# Patient Record
Sex: Female | Born: 1945
Health system: Southern US, Community
[De-identification: ages and names within clinical notes are randomized; demographics above are authoritative.]

## PROBLEM LIST (undated history)

## (undated) DIAGNOSIS — F329 Major depressive disorder, single episode, unspecified: Secondary | ICD-10-CM

## (undated) DIAGNOSIS — R259 Unspecified abnormal involuntary movements: Secondary | ICD-10-CM

## (undated) DIAGNOSIS — Z8669 Personal history of other diseases of the nervous system and sense organs: Secondary | ICD-10-CM

## (undated) DIAGNOSIS — C8590 Non-Hodgkin lymphoma, unspecified, unspecified site: Secondary | ICD-10-CM

## (undated) DIAGNOSIS — Z8719 Personal history of other diseases of the digestive system: Secondary | ICD-10-CM

## (undated) DIAGNOSIS — M329 Systemic lupus erythematosus, unspecified: Secondary | ICD-10-CM

## (undated) DIAGNOSIS — R61 Generalized hyperhidrosis: Secondary | ICD-10-CM

## (undated) DIAGNOSIS — R002 Palpitations: Secondary | ICD-10-CM

## (undated) DIAGNOSIS — K449 Diaphragmatic hernia without obstruction or gangrene: Secondary | ICD-10-CM

## (undated) DIAGNOSIS — L509 Urticaria, unspecified: Secondary | ICD-10-CM

## (undated) DIAGNOSIS — F3289 Other specified depressive episodes: Secondary | ICD-10-CM

## (undated) DIAGNOSIS — Z86711 Personal history of pulmonary embolism: Secondary | ICD-10-CM

## (undated) DIAGNOSIS — F341 Dysthymic disorder: Secondary | ICD-10-CM

## (undated) DIAGNOSIS — M0560 Rheumatoid arthritis of unspecified site with involvement of other organs and systems: Secondary | ICD-10-CM

## (undated) DIAGNOSIS — I319 Disease of pericardium, unspecified: Secondary | ICD-10-CM

## (undated) DIAGNOSIS — H8102 Meniere's disease, left ear: Secondary | ICD-10-CM

## (undated) DIAGNOSIS — E039 Hypothyroidism, unspecified: Secondary | ICD-10-CM

## (undated) DIAGNOSIS — K219 Gastro-esophageal reflux disease without esophagitis: Secondary | ICD-10-CM

## (undated) DIAGNOSIS — D638 Anemia in other chronic diseases classified elsewhere: Secondary | ICD-10-CM

## (undated) HISTORY — DX: Generalized hyperhidrosis: R61

## (undated) HISTORY — DX: Hypothyroidism, unspecified: E03.9

## (undated) HISTORY — DX: Dysthymic disorder: F34.1

## (undated) HISTORY — DX: Personal history of other diseases of the nervous system and sense organs: Z86.69

## (undated) HISTORY — PX: ESOPHAGEAL DILATION: SHX303

## (undated) HISTORY — DX: Rheumatoid arthritis of unspecified site with involvement of other organs and systems: M05.60

## (undated) HISTORY — DX: Gastro-esophageal reflux disease without esophagitis: K21.9

## (undated) HISTORY — DX: Major depressive disorder, single episode, unspecified: F32.9

## (undated) HISTORY — DX: Disease of pericardium, unspecified: I31.9

## (undated) HISTORY — DX: Other specified depressive episodes: F32.89

## (undated) HISTORY — DX: Personal history of pulmonary embolism: Z86.711

## (undated) HISTORY — DX: Diaphragmatic hernia without obstruction or gangrene: K44.9

## (undated) HISTORY — DX: Palpitations: R00.2

## (undated) HISTORY — PX: OTHER SURGICAL HISTORY: SHX169

## (undated) HISTORY — DX: Unspecified abnormal involuntary movements: R25.9

## (undated) HISTORY — DX: Anemia in other chronic diseases classified elsewhere: D63.8

## (undated) HISTORY — DX: Systemic lupus erythematosus, unspecified: M32.9

## (undated) HISTORY — DX: Urticaria, unspecified: L50.9

---

## 1972-12-29 HISTORY — PX: CHOLECYSTECTOMY: SHX55

## 1972-12-29 HISTORY — PX: APPENDECTOMY: SHX54

## 1975-12-30 HISTORY — PX: LUMBAR DISC SURGERY: SHX700

## 1976-12-29 HISTORY — PX: ABDOMINAL HYSTERECTOMY: SHX81

## 1997-12-29 HISTORY — PX: OTHER SURGICAL HISTORY: SHX169

## 1998-12-03 ENCOUNTER — Inpatient Hospital Stay (HOSPITAL_COMMUNITY): Admission: AD | Admit: 1998-12-03 | Discharge: 1998-12-07 | Payer: Self-pay | Admitting: Rheumatology

## 1998-12-03 ENCOUNTER — Encounter: Payer: Self-pay | Admitting: Rheumatology

## 1999-10-29 ENCOUNTER — Encounter: Payer: Self-pay | Admitting: Rheumatology

## 1999-10-29 ENCOUNTER — Encounter: Admission: RE | Admit: 1999-10-29 | Discharge: 1999-10-29 | Payer: Self-pay | Admitting: Rheumatology

## 2000-02-27 ENCOUNTER — Encounter: Payer: Self-pay | Admitting: Family Medicine

## 2000-04-13 ENCOUNTER — Encounter: Admission: RE | Admit: 2000-04-13 | Discharge: 2000-04-13 | Payer: Self-pay | Admitting: Rheumatology

## 2000-04-13 ENCOUNTER — Encounter: Payer: Self-pay | Admitting: Rheumatology

## 2000-11-27 ENCOUNTER — Ambulatory Visit (HOSPITAL_COMMUNITY): Admission: RE | Admit: 2000-11-27 | Discharge: 2000-11-27 | Payer: Self-pay | Admitting: Gastroenterology

## 2000-11-27 ENCOUNTER — Encounter: Payer: Self-pay | Admitting: Gastroenterology

## 2000-12-07 ENCOUNTER — Ambulatory Visit (HOSPITAL_COMMUNITY): Admission: RE | Admit: 2000-12-07 | Discharge: 2000-12-07 | Payer: Self-pay | Admitting: Gastroenterology

## 2000-12-10 ENCOUNTER — Ambulatory Visit (HOSPITAL_COMMUNITY): Admission: RE | Admit: 2000-12-10 | Discharge: 2000-12-10 | Payer: Self-pay | Admitting: Gastroenterology

## 2000-12-10 ENCOUNTER — Encounter: Payer: Self-pay | Admitting: Gastroenterology

## 2002-01-29 ENCOUNTER — Encounter: Payer: Self-pay | Admitting: Family Medicine

## 2002-02-01 ENCOUNTER — Other Ambulatory Visit: Admission: RE | Admit: 2002-02-01 | Discharge: 2002-02-01 | Payer: Self-pay | Admitting: Family Medicine

## 2002-02-07 ENCOUNTER — Encounter: Payer: Self-pay | Admitting: Family Medicine

## 2002-02-07 ENCOUNTER — Encounter: Admission: RE | Admit: 2002-02-07 | Discharge: 2002-02-07 | Payer: Self-pay | Admitting: Family Medicine

## 2002-02-23 ENCOUNTER — Ambulatory Visit (HOSPITAL_COMMUNITY): Admission: RE | Admit: 2002-02-23 | Discharge: 2002-02-23 | Payer: Self-pay | Admitting: Oncology

## 2002-02-23 ENCOUNTER — Encounter: Payer: Self-pay | Admitting: Oncology

## 2005-06-24 ENCOUNTER — Ambulatory Visit: Payer: Self-pay | Admitting: Family Medicine

## 2005-07-02 ENCOUNTER — Ambulatory Visit: Payer: Self-pay | Admitting: Family Medicine

## 2005-08-01 ENCOUNTER — Ambulatory Visit: Payer: Self-pay | Admitting: Family Medicine

## 2005-08-05 ENCOUNTER — Ambulatory Visit: Payer: Self-pay | Admitting: Family Medicine

## 2005-08-22 ENCOUNTER — Encounter: Admission: RE | Admit: 2005-08-22 | Discharge: 2005-08-22 | Payer: Self-pay | Admitting: Family Medicine

## 2005-09-04 ENCOUNTER — Encounter: Admission: RE | Admit: 2005-09-04 | Discharge: 2005-09-04 | Payer: Self-pay | Admitting: Family Medicine

## 2005-10-27 ENCOUNTER — Ambulatory Visit: Payer: Self-pay | Admitting: Family Medicine

## 2005-11-17 ENCOUNTER — Ambulatory Visit: Payer: Self-pay | Admitting: Cardiology

## 2005-12-10 ENCOUNTER — Ambulatory Visit: Payer: Self-pay | Admitting: Cardiology

## 2005-12-10 ENCOUNTER — Ambulatory Visit: Payer: Self-pay

## 2005-12-15 ENCOUNTER — Ambulatory Visit: Payer: Self-pay | Admitting: Cardiology

## 2005-12-23 ENCOUNTER — Ambulatory Visit: Payer: Self-pay | Admitting: Family Medicine

## 2006-01-01 ENCOUNTER — Ambulatory Visit: Payer: Self-pay | Admitting: Family Medicine

## 2006-01-05 ENCOUNTER — Ambulatory Visit: Payer: Self-pay | Admitting: Family Medicine

## 2006-03-03 ENCOUNTER — Ambulatory Visit: Payer: Self-pay | Admitting: Family Medicine

## 2006-06-29 ENCOUNTER — Ambulatory Visit (HOSPITAL_COMMUNITY): Admission: RE | Admit: 2006-06-29 | Discharge: 2006-06-29 | Payer: Self-pay | Admitting: Internal Medicine

## 2006-08-18 ENCOUNTER — Ambulatory Visit: Payer: Self-pay | Admitting: Family Medicine

## 2006-08-25 ENCOUNTER — Ambulatory Visit: Payer: Self-pay

## 2006-08-25 ENCOUNTER — Encounter: Payer: Self-pay | Admitting: Cardiology

## 2006-09-17 ENCOUNTER — Ambulatory Visit: Payer: Self-pay | Admitting: Family Medicine

## 2007-01-12 ENCOUNTER — Encounter: Admission: RE | Admit: 2007-01-12 | Discharge: 2007-01-12 | Payer: Self-pay | Admitting: Family Medicine

## 2007-03-12 ENCOUNTER — Ambulatory Visit: Payer: Self-pay | Admitting: Family Medicine

## 2007-03-12 LAB — CONVERTED CEMR LAB
ALT: 18 units/L (ref 0–40)
AST: 25 units/L (ref 0–37)
Albumin: 3.4 g/dL — ABNORMAL LOW (ref 3.5–5.2)
Chloride: 107 meq/L (ref 96–112)
Cholesterol: 146 mg/dL (ref 0–200)
GFR calc non Af Amer: 67 mL/min
Glucose, Bld: 85 mg/dL (ref 70–99)
HCT: 37.8 % (ref 36.0–46.0)
HDL: 34.3 mg/dL — ABNORMAL LOW (ref >39.0)
Monocytes Absolute: 0.1 10*3/uL — ABNORMAL LOW (ref 0.2–0.7)
Neutro Abs: 1.3 10*3/uL — ABNORMAL LOW (ref 1.4–7.7)
Platelets: 144 10*3/uL — ABNORMAL LOW (ref 150–400)
RBC: 4.34 M/uL (ref 3.87–5.11)
TSH: 1.02 microintl units/mL (ref 0.35–5.50)
Total Protein: 7.1 g/dL (ref 6.0–8.3)
Triglycerides: 151 mg/dL — ABNORMAL HIGH (ref 0–149)
WBC: 2.3 10*3/uL — ABNORMAL LOW (ref 4.5–10.5)

## 2007-03-16 ENCOUNTER — Ambulatory Visit: Payer: Self-pay | Admitting: Family Medicine

## 2007-04-14 ENCOUNTER — Ambulatory Visit: Payer: Self-pay | Admitting: Cardiology

## 2007-04-23 ENCOUNTER — Ambulatory Visit: Payer: Self-pay | Admitting: Family Medicine

## 2007-05-20 ENCOUNTER — Ambulatory Visit: Payer: Self-pay | Admitting: Cardiology

## 2007-05-20 DIAGNOSIS — I10 Essential (primary) hypertension: Secondary | ICD-10-CM | POA: Insufficient documentation

## 2007-05-27 ENCOUNTER — Encounter: Payer: Self-pay | Admitting: Family Medicine

## 2007-05-27 DIAGNOSIS — D649 Anemia, unspecified: Secondary | ICD-10-CM

## 2007-05-27 DIAGNOSIS — E039 Hypothyroidism, unspecified: Secondary | ICD-10-CM | POA: Insufficient documentation

## 2007-05-27 DIAGNOSIS — K449 Diaphragmatic hernia without obstruction or gangrene: Secondary | ICD-10-CM | POA: Insufficient documentation

## 2007-05-27 DIAGNOSIS — K219 Gastro-esophageal reflux disease without esophagitis: Secondary | ICD-10-CM

## 2007-05-27 DIAGNOSIS — M0560 Rheumatoid arthritis of unspecified site with involvement of other organs and systems: Secondary | ICD-10-CM | POA: Insufficient documentation

## 2007-05-27 DIAGNOSIS — Z8669 Personal history of other diseases of the nervous system and sense organs: Secondary | ICD-10-CM | POA: Insufficient documentation

## 2007-05-27 DIAGNOSIS — I319 Disease of pericardium, unspecified: Secondary | ICD-10-CM | POA: Insufficient documentation

## 2007-05-27 DIAGNOSIS — M069 Rheumatoid arthritis, unspecified: Secondary | ICD-10-CM | POA: Insufficient documentation

## 2007-05-27 DIAGNOSIS — I739 Peripheral vascular disease, unspecified: Secondary | ICD-10-CM

## 2007-05-27 DIAGNOSIS — L930 Discoid lupus erythematosus: Secondary | ICD-10-CM | POA: Insufficient documentation

## 2007-05-28 ENCOUNTER — Ambulatory Visit: Payer: Self-pay | Admitting: Family Medicine

## 2007-05-28 DIAGNOSIS — R251 Tremor, unspecified: Secondary | ICD-10-CM

## 2007-07-13 ENCOUNTER — Ambulatory Visit: Payer: Self-pay | Admitting: Family Medicine

## 2007-09-21 ENCOUNTER — Ambulatory Visit: Payer: Self-pay | Admitting: Family Medicine

## 2007-09-21 DIAGNOSIS — L989 Disorder of the skin and subcutaneous tissue, unspecified: Secondary | ICD-10-CM | POA: Insufficient documentation

## 2007-09-21 LAB — CONVERTED CEMR LAB
Ketones, urine, test strip: NEGATIVE
Protein, U semiquant: NEGATIVE
Urobilinogen, UA: NEGATIVE
pH: 6

## 2007-09-22 ENCOUNTER — Encounter (INDEPENDENT_AMBULATORY_CARE_PROVIDER_SITE_OTHER): Payer: Self-pay | Admitting: Internal Medicine

## 2007-10-11 ENCOUNTER — Encounter: Payer: Self-pay | Admitting: Family Medicine

## 2007-12-02 ENCOUNTER — Encounter: Payer: Self-pay | Admitting: Family Medicine

## 2007-12-03 ENCOUNTER — Encounter: Payer: Self-pay | Admitting: Family Medicine

## 2007-12-08 ENCOUNTER — Ambulatory Visit: Payer: Self-pay | Admitting: Family Medicine

## 2008-02-23 ENCOUNTER — Telehealth: Payer: Self-pay | Admitting: Family Medicine

## 2008-10-30 ENCOUNTER — Encounter: Payer: Self-pay | Admitting: Family Medicine

## 2009-01-08 ENCOUNTER — Ambulatory Visit: Payer: Self-pay | Admitting: Family Medicine

## 2009-01-08 DIAGNOSIS — R5383 Other fatigue: Secondary | ICD-10-CM

## 2009-01-08 DIAGNOSIS — R61 Generalized hyperhidrosis: Secondary | ICD-10-CM | POA: Insufficient documentation

## 2009-01-08 DIAGNOSIS — R5381 Other malaise: Secondary | ICD-10-CM | POA: Insufficient documentation

## 2009-01-08 LAB — CONVERTED CEMR LAB
Bilirubin Urine: NEGATIVE
Ketones, urine, test strip: NEGATIVE
Specific Gravity, Urine: 1.025

## 2009-01-09 LAB — CONVERTED CEMR LAB
ALT: 29 units/L (ref 0–35)
AST: 27 units/L (ref 0–37)
Albumin: 3.7 g/dL (ref 3.5–5.2)
Alkaline Phosphatase: 75 units/L (ref 39–117)
Basophils Absolute: 0 10*3/uL (ref 0.0–0.1)
Bilirubin, Direct: 0.1 mg/dL (ref 0.0–0.3)
Cholesterol: 161 mg/dL (ref 0–200)
Creatinine, Ser: 1.1 mg/dL (ref 0.4–1.2)
Eosinophils Relative: 1.6 % (ref 0.0–5.0)
Free T4: 1 ng/dL (ref 0.6–1.6)
GFR calc Af Amer: 65 mL/min
Glucose, Bld: 92 mg/dL (ref 70–99)
HDL: 34.4 mg/dL — ABNORMAL LOW (ref >39.0)
LDL Cholesterol: 91 mg/dL (ref 0–99)
Monocytes Relative: 6.9 % (ref 3.0–12.0)
RBC: 4.53 M/uL (ref 3.87–5.11)
RDW: 12.5 % (ref 11.5–14.6)
Sed Rate: 19 mm/hr (ref 0–22)
Sodium: 141 meq/L (ref 135–145)
Total CHOL/HDL Ratio: 4.7
Total Protein: 7.4 g/dL (ref 6.0–8.3)
Triglycerides: 179 mg/dL — ABNORMAL HIGH (ref 0–149)
Vit D, 1,25-Dihydroxy: 41 (ref 30–89)
WBC: 3.5 10*3/uL — ABNORMAL LOW (ref 4.5–10.5)

## 2009-01-17 ENCOUNTER — Ambulatory Visit: Payer: Self-pay | Admitting: Family Medicine

## 2009-01-22 ENCOUNTER — Encounter: Payer: Self-pay | Admitting: Family Medicine

## 2009-03-28 ENCOUNTER — Ambulatory Visit: Payer: Self-pay | Admitting: Family Medicine

## 2009-03-28 DIAGNOSIS — R0602 Shortness of breath: Secondary | ICD-10-CM

## 2009-03-29 ENCOUNTER — Telehealth: Payer: Self-pay | Admitting: Family Medicine

## 2009-09-25 ENCOUNTER — Ambulatory Visit: Payer: Self-pay | Admitting: Internal Medicine

## 2009-09-25 DIAGNOSIS — M329 Systemic lupus erythematosus, unspecified: Secondary | ICD-10-CM | POA: Insufficient documentation

## 2009-12-05 ENCOUNTER — Encounter: Payer: Self-pay | Admitting: Family Medicine

## 2010-01-24 ENCOUNTER — Encounter: Payer: Self-pay | Admitting: Family Medicine

## 2010-02-27 ENCOUNTER — Telehealth: Payer: Self-pay | Admitting: Family Medicine

## 2010-03-06 ENCOUNTER — Ambulatory Visit: Payer: Self-pay | Admitting: Family Medicine

## 2010-03-07 LAB — CONVERTED CEMR LAB
Albumin: 3.5 g/dL (ref 3.5–5.2)
Basophils Absolute: 0 10*3/uL (ref 0.0–0.1)
Basophils Relative: 0.1 % (ref 0.0–3.0)
CO2: 31 meq/L (ref 19–32)
Creatinine, Ser: 1.3 mg/dL — ABNORMAL HIGH (ref 0.4–1.2)
Eosinophils Absolute: 0.1 10*3/uL (ref 0.0–0.7)
Eosinophils Relative: 2.2 % (ref 0.0–5.0)
Free T4: 1.1 ng/dL (ref 0.6–1.6)
GFR calc non Af Amer: 43.68 mL/min (ref >60)
HCT: 38.1 % (ref 36.0–46.0)
MCV: 90.1 fL (ref 78.0–100.0)
Monocytes Absolute: 1.5 10*3/uL — ABNORMAL HIGH (ref 0.1–1.0)
Neutro Abs: 0.3 10*3/uL — ABNORMAL LOW (ref 1.4–7.7)
Sed Rate: 16 mm/hr (ref 0–22)

## 2010-03-12 ENCOUNTER — Ambulatory Visit: Payer: Self-pay | Admitting: Family Medicine

## 2010-03-12 ENCOUNTER — Encounter (INDEPENDENT_AMBULATORY_CARE_PROVIDER_SITE_OTHER): Payer: Self-pay | Admitting: *Deleted

## 2010-03-12 DIAGNOSIS — R1310 Dysphagia, unspecified: Secondary | ICD-10-CM

## 2010-03-15 ENCOUNTER — Encounter: Admission: RE | Admit: 2010-03-15 | Discharge: 2010-03-15 | Payer: Self-pay | Admitting: Family Medicine

## 2010-03-25 ENCOUNTER — Telehealth: Payer: Self-pay | Admitting: Family Medicine

## 2010-03-25 ENCOUNTER — Encounter: Admission: RE | Admit: 2010-03-25 | Discharge: 2010-03-25 | Payer: Self-pay | Admitting: Family Medicine

## 2010-03-25 ENCOUNTER — Encounter: Payer: Self-pay | Admitting: Family Medicine

## 2010-03-25 LAB — HM MAMMOGRAPHY

## 2010-03-29 ENCOUNTER — Encounter: Payer: Self-pay | Admitting: Family Medicine

## 2010-03-29 DIAGNOSIS — C8590 Non-Hodgkin lymphoma, unspecified, unspecified site: Secondary | ICD-10-CM

## 2010-03-29 HISTORY — PX: BREAST BIOPSY: SHX20

## 2010-03-29 HISTORY — PX: BREAST LUMPECTOMY: SHX2

## 2010-03-29 HISTORY — DX: Non-Hodgkin lymphoma, unspecified, unspecified site: C85.90

## 2010-04-01 ENCOUNTER — Telehealth: Payer: Self-pay | Admitting: Family Medicine

## 2010-04-03 ENCOUNTER — Encounter: Admission: RE | Admit: 2010-04-03 | Discharge: 2010-04-03 | Payer: Self-pay | Admitting: General Surgery

## 2010-04-05 ENCOUNTER — Ambulatory Visit
Admission: RE | Admit: 2010-04-05 | Discharge: 2010-04-05 | Payer: Self-pay | Source: Home / Self Care | Admitting: General Surgery

## 2010-04-05 ENCOUNTER — Encounter: Payer: Self-pay | Admitting: Family Medicine

## 2010-04-05 ENCOUNTER — Encounter: Admission: RE | Admit: 2010-04-05 | Discharge: 2010-04-05 | Payer: Self-pay | Admitting: General Surgery

## 2010-04-05 DIAGNOSIS — C8589 Other specified types of non-Hodgkin lymphoma, extranodal and solid organ sites: Secondary | ICD-10-CM | POA: Insufficient documentation

## 2010-04-09 ENCOUNTER — Telehealth: Payer: Self-pay | Admitting: Family Medicine

## 2010-04-18 ENCOUNTER — Ambulatory Visit: Payer: Self-pay | Admitting: Oncology

## 2010-04-18 ENCOUNTER — Encounter: Payer: Self-pay | Admitting: Family Medicine

## 2010-04-22 ENCOUNTER — Encounter: Payer: Self-pay | Admitting: Family Medicine

## 2010-08-06 ENCOUNTER — Encounter (INDEPENDENT_AMBULATORY_CARE_PROVIDER_SITE_OTHER): Payer: Self-pay | Admitting: *Deleted

## 2010-08-20 ENCOUNTER — Ambulatory Visit: Payer: Self-pay | Admitting: Oncology

## 2010-08-22 ENCOUNTER — Encounter: Payer: Self-pay | Admitting: Family Medicine

## 2010-08-22 LAB — CBC WITH DIFFERENTIAL/PLATELET
Basophils Absolute: 0 10*3/uL (ref 0.0–0.1)
MCH: 29.8 pg (ref 25.1–34.0)
MCV: 86.5 fL (ref 79.5–101.0)
Platelets: 137 10*3/uL — ABNORMAL LOW (ref 145–400)
RBC: 4.35 10*6/uL (ref 3.70–5.45)
WBC: 3.7 10*3/uL — ABNORMAL LOW (ref 3.9–10.3)
lymph#: 0.8 10*3/uL — ABNORMAL LOW (ref 0.9–3.3)

## 2010-08-29 ENCOUNTER — Telehealth: Payer: Self-pay | Admitting: Family Medicine

## 2010-09-13 ENCOUNTER — Ambulatory Visit: Payer: Self-pay | Admitting: Family Medicine

## 2010-09-13 DIAGNOSIS — R21 Rash and other nonspecific skin eruption: Secondary | ICD-10-CM

## 2010-10-17 ENCOUNTER — Encounter (INDEPENDENT_AMBULATORY_CARE_PROVIDER_SITE_OTHER): Payer: Self-pay | Admitting: *Deleted

## 2010-11-04 ENCOUNTER — Telehealth: Payer: Self-pay | Admitting: Family Medicine

## 2010-11-06 ENCOUNTER — Encounter: Payer: Self-pay | Admitting: Family Medicine

## 2010-12-27 ENCOUNTER — Encounter: Payer: Self-pay | Admitting: Family Medicine

## 2010-12-27 ENCOUNTER — Ambulatory Visit
Admission: RE | Admit: 2010-12-27 | Discharge: 2010-12-27 | Payer: Self-pay | Source: Home / Self Care | Attending: Family Medicine | Admitting: Family Medicine

## 2010-12-27 DIAGNOSIS — R6883 Chills (without fever): Secondary | ICD-10-CM

## 2010-12-27 LAB — CONVERTED CEMR LAB
Blood in Urine, dipstick: NEGATIVE
Glucose, Urine, Semiquant: NEGATIVE
Urobilinogen, UA: 0.2
WBC Urine, dipstick: NEGATIVE

## 2011-01-20 ENCOUNTER — Encounter: Payer: Self-pay | Admitting: Family Medicine

## 2011-01-21 ENCOUNTER — Encounter
Admission: RE | Admit: 2011-01-21 | Discharge: 2011-01-21 | Payer: Self-pay | Source: Home / Self Care | Attending: Gastroenterology | Admitting: Gastroenterology

## 2011-01-24 ENCOUNTER — Ambulatory Visit (HOSPITAL_COMMUNITY)
Admission: RE | Admit: 2011-01-24 | Discharge: 2011-01-24 | Payer: Self-pay | Source: Home / Self Care | Attending: Gastroenterology | Admitting: Gastroenterology

## 2011-01-24 HISTORY — PX: ESOPHAGOGASTRODUODENOSCOPY: SHX1529

## 2011-01-28 ENCOUNTER — Other Ambulatory Visit: Payer: Self-pay | Admitting: Family Medicine

## 2011-01-28 DIAGNOSIS — Z1239 Encounter for other screening for malignant neoplasm of breast: Secondary | ICD-10-CM

## 2011-01-30 NOTE — Progress Notes (Signed)
Summary: Call report-Dr. Deboraha Sprang  Phone Note From Other Clinic Call back at [phone]   Caller: Dr. Deboraha Sprang Call For: Dr. Hetty Ely Summary of Call: Dr. Deboraha Sprang called stating that patient was in there office today to have additional views taken of her left breast.  There was a mass in her left breast, a needle biopsy was performed today and the report will be ready tomorrow. Initial call taken by: Linde Gillis CMA Duncan Dull),  March 25, 2010 10:48 AM  Follow-up for Phone Call        Noted. Await further note/report. Follow-up by: Shaune Leeks MD,  March 25, 2010 5:44 PM

## 2011-01-30 NOTE — Letter (Signed)
Summary: New Patient letter  [facility]  [address] [place], [place] [address]   Phone: [phone]  Fax: [phone]       03/12/2010 MRN: [id number]  Hannah Pitts [address] [place], [place]  [address]  Dear [name],  Welcome to the Gastroenterology Division at [facility].    You are scheduled to see Dr.  Jarold Motto on 04-09-10 at 1:30p.m. on the 3rd floor at [facility], [address].  We ask that you try to arrive at our office 15 minutes prior to your appointment time to allow for check-in.  We would like you to complete the enclosed self-administered evaluation form prior to your visit and bring it with you on the day of your appointment.  We will review it with you.  Also, please bring a complete list of all your medications or, if you prefer, bring the medication bottles and we will list them.  Please bring your insurance card so that we may make a copy of it.  If your insurance requires a referral to see a specialist, please bring your referral form from your primary care physician.  Co-payments are due at the time of your visit and may be paid by cash, check or credit card.     Your office visit will consist of a consult with your physician (includes a physical exam), any laboratory testing he/she may order, scheduling of any necessary diagnostic testing (e.g. x-ray, ultrasound, CT-scan), and scheduling of a procedure (e.g. Endoscopy, Colonoscopy) if required.  Please allow enough time on your schedule to allow for any/all of these possibilities.    If you cannot keep your appointment, please call [phone] to cancel or reschedule prior to your appointment date.  This allows [place] the opportunity to schedule an appointment for another patient in need of care.  If you do not cancel or reschedule by 5 p.m. the business day prior to your appointment date, you will be charged a $50.00 late cancellation/no-show fee.    Thank you for choosing  [facility] for your medical needs.  We appreciate the opportunity to care for you.  Please visit [place] at our website  to learn more about our practice.                     Sincerely,                                                             The Gastroenterology Division

## 2011-01-30 NOTE — Letter (Signed)
[facility]   Imported By: Sherian Rein 09/05/2010 11:21:19  _____________________________________________________________________  External Attachment:    Type:   Image     Comment:   External Document  Appended Document: [facility]     Clinical Lists Changes  Observations: Added new observation of PAST MED HX: LUPUS (ICD-710.0)..................................................................Marland KitchenZimenski SHORTNESS OF BREATH (ICD-786.05)     - PFT's 03/28/09 FEV1 1.30 (53%) ratio 67% FATIGUE (ICD-780.79) SWEATING (ICD-780.8) ANXIETY DEPRESSION (ICD-300.4) SKIN LESION (ICD-709.9) UTI (ICD-599.0) HYPERTENSION, BENIGN ESSENTIAL (ICD-401.1) TREMOR (ICD-781.0) DISORDER, DEPRESSIVE NEC RECURRENT (ICD-311) URTICARIA (ICD-708.9) PERICARDITIS (ICD-423.9) ANEMIA OF CHRONIC DISEASE (ICD-285.29) HIATAL HERNIA (ICD-553.3) ERYTHEMATOSUS, LUPUS (ICD-695.4) ARTHRITIS, RHEUMATOID, SYSTEMIC NEC (ICD-714.2) MENIERE'S DISEASE, HX OF (ICD-V12.49) PERIPHERAL VASCULAR DISEASE (ICD-443.9) HYPOTHYROIDISM (ICD-244.9) GERD (ICD-530.81) MALT lymphoma and leukopenia per Dr. Truett Perna 2011 (09/05/2010 18:26)       Past History:  Past Medical History: LUPUS (ICD-710.0)..................................................................Marland KitchenZimenski SHORTNESS OF BREATH (ICD-786.05)     - PFT's 03/28/09 FEV1 1.30 (53%) ratio 67% FATIGUE (ICD-780.79) SWEATING (ICD-780.8) ANXIETY DEPRESSION (ICD-300.4) SKIN LESION (ICD-709.9) UTI (ICD-599.0) HYPERTENSION, BENIGN ESSENTIAL (ICD-401.1) TREMOR (ICD-781.0) DISORDER, DEPRESSIVE NEC RECURRENT (ICD-311) URTICARIA (ICD-708.9) PERICARDITIS (ICD-423.9) ANEMIA OF CHRONIC DISEASE (ICD-285.29) HIATAL HERNIA (ICD-553.3) ERYTHEMATOSUS, LUPUS (ICD-695.4) ARTHRITIS, RHEUMATOID, SYSTEMIC NEC (ICD-714.2) MENIERE'S DISEASE, HX OF (ICD-V12.49) PERIPHERAL VASCULAR DISEASE (ICD-443.9) HYPOTHYROIDISM  (ICD-244.9) GERD (ICD-530.81) MALT lymphoma and leukopenia per Dr. Truett Perna 2011

## 2011-01-30 NOTE — Progress Notes (Signed)
Summary: requests nexium  Phone Note Call from Patient Call back at Home Phone [phone]   Caller: Patient Call For: Hannah Leeks MD Summary of Call: Pt states she was given samples of nexium at her physical appt and they worked Adult nurse. She is asking if a script can be sent to [address]. Initial call taken by: Lowella Petties CMA,  April 09, 2010 2:34 PM  Follow-up for Phone Call        Suggest for insurance purposes that she now try Omeprazole 45 mins before brfst. If sxs recur, will try to request Nexium. She should call her insurance comapany to check coverage of Nexium if we need to go there. Follow-up by: Hannah Leeks MD,  April 09, 2010 2:45 PM  Additional Follow-up for Phone Call Additional follow up Details #1::        Advised pt, she is agreeable. Additional Follow-up by: Lowella Petties CMA,  April 09, 2010 2:50 PM    New/Updated Medications: OMEPRAZOLE 40 MG CPDR (OMEPRAZOLE) one tab by mouth 45 mins prior to brfst. Prescriptions: OMEPRAZOLE 40 MG CPDR (OMEPRAZOLE) one tab by mouth 45 mins prior to brfst.  #30 x 12   Entered and Authorized by:   Hannah Leeks MD   Signed by:   Hannah Leeks MD on 04/09/2010   Method used:   Electronically to        [facility]  [address] [phone]* (retail)       [address]       [place], [place]  [address]       Ph: [phone]       Fax: [phone]   RxID:   [id number]

## 2011-01-30 NOTE — Letter (Signed)
[facility]   Imported By: Lanelle Bal 05/03/2010 09:22:29  _____________________________________________________________________  External Attachment:    Type:   Image     Comment:   External Document  Appended Document: [facility] Spoke with pt. She sounds very positive. Was told no chemo or radiation at this point and is happy aboit that. She understands close followup is important. She will see Dr Truett Perna in 4 mos and have significant w/u then.

## 2011-01-30 NOTE — Assessment & Plan Note (Signed)
Summary: CHECK UP/CLE   Vital Signs:  Patient profile:   65 year old female Height:      64.75 inches Weight:      204.75 pounds BMI:     34.46 Temp:     98.2 degrees F oral Pulse rate:   60 / minute Pulse rhythm:   regular BP sitting:   124 / 84  (left arm) Cuff size:   large  Vitals Entered By: Sydell Axon LPN (March 12, 2010 10:49 AM) CC: 30 Minute checkup, sees Dr. Ambrose Mantle for her GYN care, due to have a colonoscopy and would like it scheduled with Dr. Juanda Chance, Preventive Care   History of Present Illness: Pt here for Comp Exam, she hasn't seen Dr Ambrose Mantle in a long time. She is not married and has had hyst so doesn't want Vag exam. She wants a colonoscopy and declines rectal exam. She hasn't had mammo or breast exam. She has dysphagia at times, esp with chicken. She occas has trouble even woith just water. She is also hoarse today which she has noticed for a while. It comes and goes. Her impression is probably from aspiration which she was warned about in the past.  Preventive Screening-Counseling & Management  Alcohol-Tobacco     Alcohol drinks/day: 0     Smoking Status: never     Passive Smoke Exposure: no  Caffeine-Diet-Exercise     Caffeine use/day: 0     Does Patient Exercise: no, gets trembly and weak.     Type of exercise: walks     Times/week: 7  Problems Prior to Update: 1)  Lupus  (ICD-710.0) 2)  Shortness of Breath  (ICD-786.05) 3)  Fatigue  (ICD-780.79) 4)  Sweating  (ICD-780.8) 5)  Anxiety Depression  (ICD-300.4) 6)  Skin Lesion  (ICD-709.9) 7)  Uti  (ICD-599.0) 8)  Hypertension, Benign Essential  (ICD-401.1) 9)  Tremor  (ICD-781.0) 10)  Disorder, Depressive Nec Recurrent  (ICD-311) 11)  Urticaria  (ICD-708.9) 12)  Pericarditis  (ICD-423.9) 13)  Anemia of Chronic Disease  (ICD-285.29) 14)  Hiatal Hernia  (ICD-553.3) 15)  Erythematosus, Lupus  (ICD-695.4) 16)  Arthritis, Rheumatoid, Systemic Nec  (ICD-714.2) 17)  Meniere's Disease, Hx of   (ICD-V12.49) 18)  Peripheral Vascular Disease  (ICD-443.9) 19)  Hypothyroidism  (ICD-244.9) 20)  Gerd  (ICD-530.81)  Medications Prior to Update: 1)  Synthroid 112 Mcg Tabs (Levothyroxine Sodium) .Marland Kitchen.. 1 Daily By Mouth 2)  Plaquenil 200 Mg Tabs (Hydroxychloroquine Sulfate) .Marland Kitchen.. 1 Tablet Twice A Day By Mouth 3)  Tramadol Hcl 50 Mg Tabs (Tramadol Hcl) .Marland Kitchen.. 1 Two Times A Day 4)  Calcarb 600/d 600-125 Mg-Unit Tabs (Calcium-Vitamin D) .Marland Kitchen.. 1daily By Mouth 5)  Multivitamins  Tabs (Multiple Vitamin) .Marland Kitchen.. 1 Daily By Mouth 6)  Aspir-Low 81 Mg Tbec (Aspirin) .Marland Kitchen.. 1 Daily By Mouth 7)  Zantac 75 75 Mg Tabs (Ranitidine Hcl) .... 2 Pills At Bedtime 8)  Vitamin D 1000 Unit  Caps (Cholecalciferol) .Marland Kitchen.. 1 Once Daily By Mouth 9)  Metoprolol Tartrate 25 Mg  Tabs (Metoprolol Tartrate) .... One Tab By Mouth Daily 10)  Prednisone 1 Mg Tabs (Prednisone) .... 4 Mg Daily By Mouth 11)  Buspirone Hcl 15 Mg Tabs (Buspirone Hcl) .... One Tab By Mouth Three Times A Day 12)  Dexilant 60 Mg Cpdr (Dexlansoprazole) .... Take  One 30-60 Min Before First Meal of The Day  Allergies: No Known Drug Allergies  Past History:  Past Medical History: Last updated: 09/25/2009 LUPUS (ICD-710.0)..................................................................Marland KitchenZimenski SHORTNESS OF BREATH (  ICD-786.05)     - PFT's 03/28/09 FEV1 1.30 (53%) ratio 67% FATIGUE (ICD-780.79) SWEATING (ICD-780.8) ANXIETY DEPRESSION (ICD-300.4) SKIN LESION (ICD-709.9) UTI (ICD-599.0) HYPERTENSION, BENIGN ESSENTIAL (ICD-401.1) TREMOR (ICD-781.0) DISORDER, DEPRESSIVE NEC RECURRENT (ICD-311) URTICARIA (ICD-708.9) PERICARDITIS (ICD-423.9) ANEMIA OF CHRONIC DISEASE (ICD-285.29) HIATAL HERNIA (ICD-553.3) ERYTHEMATOSUS, LUPUS (ICD-695.4) ARTHRITIS, RHEUMATOID, SYSTEMIC NEC (ICD-714.2) MENIERE'S DISEASE, HX OF (ICD-V12.49) PERIPHERAL VASCULAR DISEASE (ICD-443.9) HYPOTHYROIDISM (ICD-244.9) GERD (ICD-530.81)  Family History: Last updated:  04-07-10 Father:DECEASED 62YOA CVA  Mother: DECEASED 61YOA CHF 1 SISTER A 82 CAD, SMOKER,VASC. DZ 1 SISTER A 71 CAD, SMOKER , VASCULAR DZ. 1 SISTER DECEASED CA, CAD, RF 1 SISTER DECEASED CHF, CA, (LUNG ,ESOPH.) 1 SISTER DECEASED LUNG CANCER 1 BROTHER DECEASED CAD, MI, 12 YOA 1 BROTHER DECEASED 72 YOA (PARTIAL AMP. DM) 1 BROTHER DECEASED June 04, 2004 ,CHF  Social History: Last updated: 09/25/2009 Widowed  Children- 2 Daughters Never smoker No ETOH Disabled  Risk Factors: Alcohol Use: 0 (2010/04/07) Caffeine Use: 0 (04-07-10) Exercise: no, gets trembly and weak. (07-Apr-2010)  Risk Factors: Smoking Status: never (April 07, 2010) Passive Smoke Exposure: no (2010/04/07)  Past Surgical History: CHOLECYSTECTOMY : (1974) LUMBAR DISC SURGERY :( 1977) HYSTERECTOMY ,FIBROIDS: (1978) L EAR SHUNT (MENIERE'S) (DR KRAUSS) (stopped dizziness /falls no change in hearing deficit) PERICARDITIS & PLEURAL BX. --LUPUS: l(12/1997) 2D ECHO PERICARDITIS :EFT(:12/1997) DEXA --OK NEGATIVE OSTEOPENIA: (09/1999) EGD WITH DILITATION : (11/12/2000) COLONOSCOPY PRESUME NORMAL ([facility])( 10/2000) DEXA: (11/2002) ECHO , NEGATIVE EFF. MILD LVH , MILD A.R. TR. MR,T.R. 02/22/2003 ECHO, MILDLY DILATED LV; MILD MVP WITH TRACE REGUR.; MILD AORTIC INSUFFICIENCY: (12/12/2003) DOBUTAMINE MYOVIEW NML / HOLTER PAC'S;  PVC'S13 BEAT SVT: (10/2005) ADENOSINE MYOVIEW ? ISCHEMIA APEX: (12/10/2005) UPPER ENDO : (09/1997) ULTRA SOUND ABD. : (09/1997) P.T.:(11/1997) 2D ECHO N/ LVF EF 60-65% :(12/1997) ECHO EF 55% . MILD A.R. /MVP/ MR  :(08/25/2006) HOLTER--SHOWS RARE PVC'S PAC'S & ATRIAL COUPLETS: (08/28-29/2007) DILATED EYE EXAM ([facility]) NML (12/2009)  Family History: Father:DECEASED 62YOA CVA  Mother: DECEASED 61YOA CHF 1 SISTER A 82 CAD, SMOKER,VASC. DZ 1 SISTER A 71 CAD, SMOKER , VASCULAR DZ. 1 SISTER DECEASED CA, CAD, RF 1 SISTER DECEASED CHF, CA, (LUNG ,ESOPH.) 1 SISTER DECEASED LUNG CANCER 1 BROTHER DECEASED  CAD, MI, 55 YOA 1 BROTHER DECEASED 72 YOA (PARTIAL AMP. DM) 1 BROTHER DECEASED 06/04/2004 ,CHF  Social History: Caffeine use/day:  0 Does Patient Exercise:  no, gets trembly and weak.  Review of Systems General:  Complains of chills, sweats, and weakness; denies fatigue, fever, and weight loss. Eyes:  Denies blurring, discharge, and eye pain. ENT:  Complains of decreased hearing and ringing in ears; denies earache; left ears. CV:  Complains of difficulty breathing while lying down and shortness of breath with exertion; denies chest pain or discomfort, fainting, fatigue, palpitations, swelling of feet, and swelling of hands. Resp:  Denies cough, shortness of breath, and wheezing; eating and  swallowing causes mild aspiration cough. GI:  Denies abdominal pain, bloody stools, change in bowel habits, constipation, dark tarry stools, diarrhea, indigestion, loss of appetite, nausea, vomiting, vomiting blood, and yellowish skin color; swallowing difficulties per HPI. GU:  Complains of urinary frequency; denies discharge, dysuria, nocturia, and urinary hesitancy. MS:  Complains of joint pain and low back pain; denies cramps, muscle weakness, and stiffness. Derm:  Complains of dryness; denies itching and rash; has excema. Neuro:  Complains of poor balance; denies numbness, tingling, and tremors; mild .  Physical Exam  General:  Well-developed,well-nourished,in no acute distress; alert,appropriate and cooperative throughout examination. Has gained weight. Head:  Normocephalic and atraumatic  without obvious abnormalities. No apparent alopecia or balding. Sinuses NT. Eyes:  Conjunctiva clear bilaterally.  Ears:  External ear exam shows no significant lesions or deformities.  Otoscopic examination reveals clear canals, tympanic membranes are intact bilaterally without bulging, retraction, inflammation or discharge. Hearing is grossly normal bilaterally. Nose:  External nasal examination shows no deformity or  inflammation. Nasal mucosa are pink and moist without lesions or exudates. Mouth:  Oral mucosa and oropharynx without lesions or exudates.  Teeth in good repair. Neck:  No deformities, masses, or tenderness noted. Chest Wall:  No deformities, masses, or tenderness noted. Breasts:  No mass, nodules, thickening, tenderness, bulging, retraction, inflamation, nipple discharge or skin changes noted.   Lungs:  Normal respiratory effort, chest expands symmetrically. Lungs are clear to auscultation, no crackles or wheezes. Good air exchange. Heart:  Normal rate and regular rhythm. S1 and S2 normal without gallop, murmur, click, rub or other extra sounds. Abdomen:  Bowel sounds positive,abdomen soft and non-tender without masses, organomegaly or hernias noted. Rectal:  Declined Genitalia:  Declined. Msk:  No deformity or scoliosis noted of thoracic or lumbar spine.   Pulses:  R and L carotid,radial,femoral,dorsalis pedis and posterior tibial pulses are full and equal bilaterally Extremities:  No clubbing, cyanosis, edema, or deformity noted with normal full range of motion of all joints.   Neurologic:  No cranial nerve deficits noted. Station and gait are normal. Sensory, motor and coordinative functions appear intact. Skin:  Intact without suspicious lesions or rashes Cervical Nodes:  No lymphadenopathy noted Inguinal Nodes:  No significant adenopathy Psych:  Cognition and judgment appear intact. Alert and cooperative with normal attention span and concentration. No apparent delusions, illusions, hallucinations   Impression & Recommendations:  Problem # 1:  HEALTH MAINTENANCE EXAM (ICD-V70.0) Assessment Comment Only Will refer for GI eval and colonoscopy, prob EGD as well. Discussed regular exercise.  Needs Td today. Discussed Zostavax. Declines Pap and Rectal.  Problem # 2:  DYSPHAGIA UNSPECIFIED (ICD-787.20) Assessment: New Refer to GI. Orders: Gastroenterology Referral (GI)  Problem #  3:  OTHER SCREENING MAMMOGRAM (ICD-V76.12) Assessment: Unchanged Breast exam nml, referred for mammo. Orders: Radiology Referral (Radiology)  Problem # 4:  SHORTNESS OF BREATH (ICD-786.05) Assessment: Unchanged Stable and acceptable at present.  Problem # 5:  FATIGUE (ICD-780.79) Assessment: Unchanged Baseline.  Problem # 6:  HYPERTENSION, BENIGN ESSENTIAL (ICD-401.1) Assessment: Improved  Her updated medication list for this problem includes:    Metoprolol Tartrate 25 Mg Tabs (Metoprolol tartrate) ..... One tab by mouth daily  BP today: 124/84 Prior BP: 139/80 (09/25/2009)  Labs Reviewed: K+: 4.4 (03/06/2010) Creat: : 1.3 (03/06/2010)   Chol: 161 (01/08/2009)   HDL: 34.4 (01/08/2009)   LDL: 91 (01/08/2009)   TG: 179 (01/08/2009)  Problem # 7:  DISORDER, DEPRESSIVE NEC RECURRENT (ICD-311) Assessment: Unchanged Well controlled Denies SI/HI. Her updated medication list for this problem includes:    Buspirone Hcl 15 Mg Tabs (Buspirone hcl) ..... One tab by mouth three times a day  Problem # 8:  ANEMIA OF CHRONIC DISEASE (ICD-285.29) Assessment: Unchanged Stable. Hgb: 12.5 (03/06/2010)   Hct: 38.1 (03/06/2010)   Platelets: 130.0 (03/06/2010) RBC: 4.23 (03/06/2010)   RDW: 12.5 (03/06/2010)   WBC: 2.9 (03/06/2010) MCV: 90.1 (03/06/2010)   MCHC: 32.8 (03/06/2010) Iron: 86 (03/06/2010)   TSH: 1.51 (03/06/2010)  Problem # 9:  HYPOTHYROIDISM (ICD-244.9) Assessment: Unchanged Euthyroid on curr meds. Her updated medication list for this problem includes:    Synthroid 112 Mcg Tabs (Levothyroxine sodium) .Marland KitchenMarland KitchenMarland KitchenMarland Kitchen 1  daily by mouth  Labs Reviewed: TSH: 1.51 (03/06/2010)    HgBA1c: 5.6 (02/27/2000) Chol: 161 (01/08/2009)   HDL: 34.4 (01/08/2009)   LDL: 91 (01/08/2009)   TG: 179 (01/08/2009)  Problem # 10:  GERD (ICD-530.81) Assessment: Deteriorated  Will give trial of Nexium for 6 weeks. Hopefully will be seen by Dr Juanda Chance by then. The following medications were removed from the  medication list:    Dexilant 60 Mg Cpdr (Dexlansoprazole) .Marland Kitchen... Take  one 30-60 min before first meal of the day Her updated medication list for this problem includes:    Zantac 75 75 Mg Tabs (Ranitidine hcl) .Marland Kitchen... 2 pills at bedtime  Diagnostics Reviewed:  Discussed lifestyle modifications, diet, antacids/medications, and preventive measures. Handout provided.   Complete Medication List: 1)  Synthroid 112 Mcg Tabs (Levothyroxine sodium) .Marland Kitchen.. 1 daily by mouth 2)  Plaquenil 200 Mg Tabs (Hydroxychloroquine sulfate) .Marland Kitchen.. 1 tablet twice a day by mouth 3)  Tramadol Hcl 50 Mg Tabs (Tramadol hcl) .Marland Kitchen.. 1 two times a day 4)  Calcarb 600/d 600-125 Mg-unit Tabs (Calcium-vitamin d) .Marland Kitchen.. 1daily by mouth 5)  Multivitamins Tabs (Multiple vitamin) .Marland Kitchen.. 1 daily by mouth 6)  Aspir-low 81 Mg Tbec (Aspirin) .Marland Kitchen.. 1 daily by mouth 7)  Zantac 75 75 Mg Tabs (Ranitidine hcl) .... 2 pills at bedtime 8)  Vitamin D 1000 Unit Caps (Cholecalciferol) .Marland Kitchen.. 1 once daily by mouth 9)  Metoprolol Tartrate 25 Mg Tabs (Metoprolol tartrate) .... One tab by mouth daily 10)  Prednisone 1 Mg Tabs (Prednisone) .... 4 mg daily by mouth 11)  Buspirone Hcl 15 Mg Tabs (Buspirone hcl) .... One tab by mouth three times a day 12)  Fish Oil 1000 Mg Caps (Omega-3 fatty acids) .... Take one by mouth daily  Other Orders: TD Toxoids IM 7 YR + (27253) Admin 1st Vaccine (66440)  PAP Screening:    Last PAP smear:  01/29/2002  PAP Smear Results:    Date of Exam:  03/12/2010    Results:  Unsatisfactory  PAP Smear Comments:    PT DECLINED.  Mammogram Screening:    Last Mammogram:  12/29/2006  Osteoporosis Risk Assessment:  Risk Factors for Fracture or Low Bone Density:   Race (White or Asian):     yes   Smoking status:       never   Thyroid replacement:     yes   Thyroid disease:     yes   Rheumatoid Arthritis:     yes  Immunization & Chemoprophylaxis:    Tetanus vaccine: Td  (03/12/2010)    Influenza vaccine: Fluvax 3+   (11/29/2007)    Pneumovax: Pneumovax  (07/29/1998)  Patient Instructions: 1)  Refer for  Mammo. 2)  Refer to Gi, Dr Juanda Chance 3)  Trial of Nexium daily.  Current Allergies (reviewed today): No known allergies    Tetanus/Td Vaccine    Vaccine Type: Td    Site: left deltoid    Mfr: Sanofi Pasteur    Dose: 0.5 ml    Route: IM    Given by: Sydell Axon LPN    Exp. Date: 11/13/2011    Lot #: [id number]    VIS given: 11/16/07 version given March 12, 2010.

## 2011-01-30 NOTE — Letter (Signed)
Summary: New Patient letter  [facility]  [address] [place], [place] [address]   Phone: [phone]  Fax: [phone]       10/17/2010 MRN: [id number]  Hannah Pitts [address] [place], [place]  [address]  Dear [name],  Welcome to the Gastroenterology Division at [facility].    You are scheduled to see Dr.  Christella Hartigan on 11-27-10 at 1:00a.m. on the 3rd floor at [facility], [address].  We ask that you try to arrive at our office 15 minutes prior to your appointment time to allow for check-in.  We would like you to complete the enclosed self-administered evaluation form prior to your visit and bring it with you on the day of your appointment.  We will review it with you.  Also, please bring a complete list of all your medications or, if you prefer, bring the medication bottles and we will list them.  Please bring your insurance card so that we may make a copy of it.  If your insurance requires a referral to see a specialist, please bring your referral form from your primary care physician.  Co-payments are due at the time of your visit and may be paid by cash, check or credit card.     Your office visit will consist of a consult with your physician (includes a physical exam), any laboratory testing he/she may order, scheduling of any necessary diagnostic testing (e.g. x-ray, ultrasound, CT-scan), and scheduling of a procedure (e.g. Endoscopy, Colonoscopy) if required.  Please allow enough time on your schedule to allow for any/all of these possibilities.    If you cannot keep your appointment, please call [phone] to cancel or reschedule prior to your appointment date.  This allows [place] the opportunity to schedule an appointment for another patient in need of care.  If you do not cancel or reschedule by 5 p.m. the business day prior to your appointment date, you will be charged a $50.00 late cancellation/no-show fee.    Thank you for choosing  [facility] for your medical needs.  We appreciate the opportunity to care for you.  Please visit [place] at our website  to learn more about our practice.                     Sincerely,                                                             The Gastroenterology Division

## 2011-01-30 NOTE — Assessment & Plan Note (Signed)
Summary: UTI (?), CHILLS and HEADACHES / LFW   Vital Signs:  Patient profile:   65 year old female Height:      64.75 inches Weight:      206.25 pounds BMI:     34.71 Temp:     98.6 degrees F oral Pulse rate:   80 / minute Pulse rhythm:   regular BP sitting:   126 / 76  (left arm) Cuff size:   large  Vitals Entered By: Delilah Shan CMA  Dull) (December 27, 2010 8:12 AM) CC: ? UTI, chillls, headache   History of Present Illness: Has been having chills w/o fever along with dull, mild HA.  She had UTI with Ziminski in 11/11, was treated for this.  No dysuria now (or in 11/11).  H/o lupus per rheum and MALT per Dr. Truett Perna.  No other sick contacts.  No rash, diarrhea, vomiting.  No cough, no sputum.  No ST.  HA is constant for last few days, across the forhead.  No ear pain, no rhinorrhea.  Voice is unchanged.  Some fatigue from the lupus.  The chills predate the HA- they have been going on for weeks but have increased recently.  HA started in last few days.   Has follow up with GI re: vomiting.  Likely combination of HH and Sjrogren's.  Improved over last few weeks w/o recent vomiting.    Current Medications (verified): 1)  Synthroid 112 Mcg Tabs (Levothyroxine Sodium) .Marland Kitchen.. 1 Daily By Mouth 2)  Plaquenil 200 Mg Tabs (Hydroxychloroquine Sulfate) .Marland Kitchen.. 1 Tablet Twice A Day By Mouth 3)  Calcarb 600/d 600-125 Mg-Unit Tabs (Calcium-Vitamin D) .Marland Kitchen.. 1daily By Mouth 4)  Multivitamins  Tabs (Multiple Vitamin) .Marland Kitchen.. 1 Daily By Mouth 5)  Aspir-Low 81 Mg Tbec (Aspirin) .Marland Kitchen.. 1 Daily By Mouth 6)  Vitamin D 1000 Unit  Caps (Cholecalciferol) .Marland Kitchen.. 1 Once Daily By Mouth 7)  Metoprolol Tartrate 25 Mg  Tabs (Metoprolol Tartrate) .... One Tab By Mouth Daily 8)  Prednisone 1 Mg Tabs (Prednisone) .... 4 Mg Daily By Mouth 9)  Buspirone Hcl 15 Mg Tabs (Buspirone Hcl) .... One Tab By Mouth Three Times A Day 10)  Fish Oil 1000 Mg Caps (Omega-3 Fatty Acids) .... Take One By Mouth Daily 11)  Omeprazole 40 Mg  Cpdr (Omeprazole) .... One Tab By Mouth 45 Mins Prior To Brfst.  Allergies: No Known Drug Allergies  Past History:  Past Medical History: Reviewed history from 09/13/2010 and no changes required. LUPUS (ICD-710.0)..................................................................Marland KitchenZimenski SHORTNESS OF BREATH (ICD-786.05)     - PFT's 03/28/09 FEV1 1.30 (53%) ratio 67% FATIGUE (ICD-780.79) SWEATING (ICD-780.8) ANXIETY DEPRESSION (ICD-300.4) SKIN LESION (ICD-709.9) UTI (ICD-599.0) HYPERTENSION, BENIGN ESSENTIAL (ICD-401.1) TREMOR (ICD-781.0) DISORDER, DEPRESSIVE NEC RECURRENT (ICD-311) URTICARIA (ICD-708.9) PERICARDITIS (ICD-423.9) ANEMIA OF CHRONIC DISEASE (ICD-285.29) HIATAL HERNIA (ICD-553.3) ERYTHEMATOSUS, LUPUS (ICD-695.4) ARTHRITIS, RHEUMATOID, SYSTEMIC NEC (ICD-714.2) MENIERE'S DISEASE, HX OF (ICD-V12.49) PERIPHERAL VASCULAR DISEASE (ICD-443.9) HYPOTHYROIDISM (ICD-244.9) GERD (ICD-530.81) MALT lymphoma and leukopenia per Dr. Truett Perna 2011 Rheum- Dr. Oleta Mouse Ophtho- Dr. Nile Riggs  Review of Systems       See HPI.  Otherwise negative.    Physical Exam  General:  no apparent distress normocephalic atraumatic mucous membranes mildly dry, as expected but OP wnl o/w tm wnl x2 and nasal exam wnl PERRL, EOMI not tender to palpation on frontal/max sinuses neck supple w/o LA regular rate and rhythm clear to auscultation bilaterally ext well perfused. chronic changes on hands noted.   no cva pain   Impression & Recommendations:  Problem #  1:  CHILLS WITHOUT FEVER (ICD-780.64)  Unclear source and patient is well appearing, nontoxic.  I would cx urine, treat is positive and observe symptoms ow.  she agrees.   Orders: Specimen Handling ([address]) T-Culture, Urine ([id number]) UA Dipstick w/o Micro (manual) (81002)  Complete Medication List: 1)  Synthroid 112 Mcg Tabs (Levothyroxine sodium) .Marland Kitchen.. 1 daily by mouth 2)  Plaquenil 200 Mg Tabs (Hydroxychloroquine sulfate)  .Marland Kitchen.. 1 tablet twice a day by mouth 3)  Calcarb 600/d 600-125 Mg-unit Tabs (Calcium-vitamin d) .Marland Kitchen.. 1daily by mouth 4)  Multivitamins Tabs (Multiple vitamin) .Marland Kitchen.. 1 daily by mouth 5)  Aspir-low 81 Mg Tbec (Aspirin) .Marland Kitchen.. 1 daily by mouth 6)  Vitamin D 1000 Unit Caps (Cholecalciferol) .Marland Kitchen.. 1 once daily by mouth 7)  Metoprolol Tartrate 25 Mg Tabs (Metoprolol tartrate) .... One tab by mouth daily 8)  Prednisone 1 Mg Tabs (Prednisone) .... 4 mg daily by mouth 9)  Buspirone Hcl 15 Mg Tabs (Buspirone hcl) .... One tab by mouth three times a day 10)  Fish Oil 1000 Mg Caps (Omega-3 fatty acids) .... Take one by mouth daily 11)  Omeprazole 40 Mg Cpdr (Omeprazole) .... One tab by mouth 45 mins prior to brfst.  Patient Instructions: 1)  We'll contact you with your lab report.  We can treat you if the culture is positive.  I would just observe your symptoms in the meantime.  Let me know if you don't get better or if you have any other changes in the meantime.  Take care.    Orders Added: 1)  Est. Patient Level III [91478] 2)  Specimen Handling [99000] 3)  T-Culture, Urine [[id number]] 4)  UA Dipstick w/o Micro (manual) [81002]    Current Allergies (reviewed today): No known allergies    Laboratory Results   Urine Tests  Date/Time Received: December 27, 2010 8:50 AM   Routine Urinalysis   Color: yellow Appearance: Clear Glucose: negative   (Normal Range: Negative) Bilirubin: negative   (Normal Range: Negative) Ketone: negative   (Normal Range: Negative) Spec. Gravity: >=1.030   (Normal Range: 1.003-1.035) Blood: negative   (Normal Range: Negative) pH: 6.0   (Normal Range: 5.0-8.0) Protein: trace   (Normal Range: Negative) Urobilinogen: 0.2   (Normal Range: 0-1) Nitrite: negative   (Normal Range: Negative) Leukocyte Esterace: negative   (Normal Range: Negative)

## 2011-01-30 NOTE — Assessment & Plan Note (Signed)
Summary: ?RASH UNDER BREAST/DR SCHALLER'S PT'   Vital Signs:  Patient profile:   65 year old female Height:      64.75 inches Weight:      215.50 pounds BMI:     36.27 Temp:     98.5 degrees F oral Pulse rate:   88 / minute Pulse rhythm:   regular BP sitting:   116 / 74  (left arm) Cuff size:   large  Vitals Entered By: Delilah Shan CMA Duncan Dull) (September 13, 2010 10:33 AM) CC: Rash under breast   History of Present Illness: B rash under breasts.  There for 1 week.  Red, some itching.  Started on R side.  Using cortisone and triple antibiotic on it w/o relief.  "Sweatin a lot under the breasts."  no fevers.    Allergies: No Known Drug Allergies  Past History:  Past Medical History: LUPUS (ICD-710.0)..................................................................Marland KitchenZimenski SHORTNESS OF BREATH (ICD-786.05)     - PFT's 03/28/09 FEV1 1.30 (53%) ratio 67% FATIGUE (ICD-780.79) SWEATING (ICD-780.8) ANXIETY DEPRESSION (ICD-300.4) SKIN LESION (ICD-709.9) UTI (ICD-599.0) HYPERTENSION, BENIGN ESSENTIAL (ICD-401.1) TREMOR (ICD-781.0) DISORDER, DEPRESSIVE NEC RECURRENT (ICD-311) URTICARIA (ICD-708.9) PERICARDITIS (ICD-423.9) ANEMIA OF CHRONIC DISEASE (ICD-285.29) HIATAL HERNIA (ICD-553.3) ERYTHEMATOSUS, LUPUS (ICD-695.4) ARTHRITIS, RHEUMATOID, SYSTEMIC NEC (ICD-714.2) MENIERE'S DISEASE, HX OF (ICD-V12.49) PERIPHERAL VASCULAR DISEASE (ICD-443.9) HYPOTHYROIDISM (ICD-244.9) GERD (ICD-530.81) MALT lymphoma and leukopenia per Dr. Truett Perna 2011 Rheum- Dr. Oleta Mouse Ophtho- Dr. Nile Riggs  Review of Systems       See HPI.  Otherwise negative.    Physical Exam  General:  NAD RRR CTAB bilateral erythematous patches w/satellite lesions under breasts   Impression & Recommendations:  Problem # 1:  RASH AND OTHER NONSPECIFIC SKIN ERUPTION (ICD-782.1)  Typical appearing superficial fungal infection.  D/w patient BM:WUXLKGMW control.  Use nystatin as directed and then transition  to drying agent.  follow up as needed.  She understood.   Her updated medication list for this problem includes:    Nystatin 100000 Unit/gm Crea (Nystatin) .Marland Kitchen... Aaa three times a day until resolved and then for 5 more days.  Complete Medication List: 1)  Synthroid 112 Mcg Tabs (Levothyroxine sodium) .Marland Kitchen.. 1 daily by mouth 2)  Plaquenil 200 Mg Tabs (Hydroxychloroquine sulfate) .Marland Kitchen.. 1 tablet twice a day by mouth 3)  Calcarb 600/d 600-125 Mg-unit Tabs (Calcium-vitamin d) .Marland Kitchen.. 1daily by mouth 4)  Multivitamins Tabs (Multiple vitamin) .Marland Kitchen.. 1 daily by mouth 5)  Aspir-low 81 Mg Tbec (Aspirin) .Marland Kitchen.. 1 daily by mouth 6)  Vitamin D 1000 Unit Caps (Cholecalciferol) .Marland Kitchen.. 1 once daily by mouth 7)  Metoprolol Tartrate 25 Mg Tabs (Metoprolol tartrate) .... One tab by mouth daily 8)  Prednisone 1 Mg Tabs (Prednisone) .... 4 mg daily by mouth 9)  Buspirone Hcl 15 Mg Tabs (Buspirone hcl) .... One tab by mouth three times a day 10)  Fish Oil 1000 Mg Caps (Omega-3 fatty acids) .... Take one by mouth daily 11)  Omeprazole 40 Mg Cpdr (Omeprazole) .... One tab by mouth 45 mins prior to brfst. 12)  Nystatin 100000 Unit/gm Crea (Nystatin) .... Aaa three times a day until resolved and then for 5 more days.  Patient Instructions: 1)  Use the nystatin as directed.  When you finish that, keep the area dry with talc or gold bond powder.  Take care.  Let me know if you aren't getting better.   Prescriptions: NYSTATIN 100000 UNIT/GM CREA (NYSTATIN) AAA three times a day until resolved and then for 5 more days.  #15gram x 2  Entered and Authorized by:   Crawford Givens MD   Signed by:   Crawford Givens MD on 09/13/2010   Method used:   [facility]        [facility]  [address] [phone]* (retail)       [address]       [place], [place]  [address]       Ph: [phone]       Fax: [phone]   RxID:   [id number]   Current Allergies (reviewed today): No known allergies

## 2011-01-30 NOTE — Letter (Signed)
Summary: Nadara Eaton letter  [place] at [facility]  [address] [place], [place] [address]   Phone: [phone]  Fax: [phone]       08/06/2010 MRN: [id number]  DEIDRA SPEASE [address] [place], [place]  [address]  Dear Ms. [facility] [place], and [facility] announce the retirement of Arta Silence, M.D., from full-time practice at the [facility] office effective June 27, 2010 and his plans of returning part-time.  It is important to Dr. Hetty Ely and to our practice that you understand that [facility] has seven physicians in our office for your health care needs.  We will continue to offer the same exceptional care that you have today.    Dr. Hetty Ely has spoken to many of you about his plans for retirement and returning part-time in the fall.   We will continue to work with you through the transition to schedule appointments for you in the office and meet the high standards that [place] is committed to.   Again, it is with great pleasure that we share the news that Dr. Hetty Ely will return to [facility] in October of 2011 with a reduced schedule.    If you have any questions, or would like to request an appointment with one of our physicians, please call us at [phone] and press the option for Scheduling an appointment.  We take pleasure in providing you with excellent patient care and look forward to seeing you at your next office visit.  Our [facility] Physicians are:  Tillman Abide, M.D. Laurita Quint, M.D. Roxy Manns, M.D. Kerby Nora, M.D. Hannah Beat, M.D. Ruthe Mannan, M.D. We proudly welcomed Raechel Ache, M.D. and Eustaquio Boyden, M.D. to the practice in July/August 2011.  Sincerely,  [facility]

## 2011-01-30 NOTE — Progress Notes (Signed)
Summary: Rx Buspirone  Phone Note Refill Request Call back at [phone] Message [facility]:  [facility] on April 01, 2010 9:19 AM  Refills Requested: Medication #1:  BUSPIRONE HCL 15 MG TABS one tab by mouth three times a day  Method Requested: Electronic Initial call taken by: Sydell Axon LPN,  April 01, 2010 9:20 AM    Prescriptions: BUSPIRONE HCL 15 MG TABS (BUSPIRONE HCL) one tab by mouth three times a day  #90 x 5   Entered and Authorized by:   Shaune Leeks MD   Signed by:   Shaune Leeks MD on 04/01/2010   Method used:   [facility]        [facility]  [address] [phone]* (retail)       [address]       [place], [place]  [address]       Ph: [phone]       Fax: [phone]   RxID:   [id number]

## 2011-01-30 NOTE — Letter (Signed)
Summary: Dr.Haywood Ingram,Surgeon,Note  Dr.Haywood Ingram,Surgeon,Note   Imported By: Beau Fanny 04/15/2010 13:13:35  _____________________________________________________________________  External Attachment:    Type:   Image     Comment:   External Document

## 2011-01-30 NOTE — Letter (Signed)
Summary: Hannah Pitts [facility],Dilated Eye Examination Report  [facility],Dilated Eye Examination Report   Imported By: Hannah Pitts 01/24/2010 13:38:28  _____________________________________________________________________  External Attachment:    Type:   Image     Comment:   External Document

## 2011-01-30 NOTE — Letter (Signed)
Summary: Dr.Haywood Ingram,Postop Visit Note  Dr.Haywood Ingram,Postop Visit Note   Imported By: Beau Fanny 05/02/2010 11:42:29  _____________________________________________________________________  External Attachment:    Type:   Image     Comment:   External Document

## 2011-01-30 NOTE — Progress Notes (Signed)
Summary: Rx Buspirone  Phone Note Refill Request Call back at [phone] Message from:  [facility] on November 04, 2010 10:04 AM  Refills Requested: Medication #1:  BUSPIRONE HCL 15 MG TABS one tab by mouth three times a day  Method Requested: Electronic Initial call taken by: Sydell Axon LPN,  November 04, 2010 10:05 AM  Follow-up for Phone Call       Follow-up by: Crawford Givens MD,  November 04, 2010 1:50 PM    Prescriptions: BUSPIRONE HCL 15 MG TABS (BUSPIRONE HCL) one tab by mouth three times a day  #90 x 5   Entered and Authorized by:   Crawford Givens MD   Signed by:   Crawford Givens MD on 11/04/2010   Method used:   [facility]        [facility]  [address] [phone][facility] (retail)       [address]       [place], [place]  [address]       Ph: [phone]       Fax: [phone]   RxID:   [id number]

## 2011-01-30 NOTE — Progress Notes (Signed)
Summary: Rx Buspirone  Phone Note Refill Request Call back at [phone] Message from:  [facility] on February 27, 2010 9:07 AM  Refills Requested: Medication #1:  BUSPIRONE HCL 15 MG TABS one tab by mouth three times a day Received e-scribe refill request. Patient was last seen 03/28/09 and no appt scheduled.   Method Requested: Electronic Initial call taken by: Sydell Axon LPN,  February 27, 2010 9:08 AM  Follow-up for Phone Call        Needs to be seen before further refills. Follow-up by: Shaune Leeks MD,  February 27, 2010 1:33 PM  Additional Follow-up for Phone Call Additional follow up Details #1::        Spoke to pharmacist and she will let patient know that she needs to schedule appt for further refills. Additional Follow-up by: Sydell Axon LPN,  February 27, 2010 2:11 PM    Prescriptions: BUSPIRONE HCL 15 MG TABS (BUSPIRONE HCL) one tab by mouth three times a day  #90 x 0   Entered and Authorized by:   Shaune Leeks MD   Signed by:   Shaune Leeks MD on 02/27/2010   Method used:   [facility]        [facility]  [address] [phone]* (retail)       [address]       [place], [place]  [address]       Ph: [phone]       Fax: [phone]   RxID:   [id number]

## 2011-01-30 NOTE — Progress Notes (Signed)
Summary: Rx Metoprolol  Phone Note Refill Request Call back at [phone] Message from:  [facility] on August 29, 2010 9:10 AM  Refills Requested: Medication #1:  METOPROLOL TARTRATE 25 MG  TABS one tab by mouth daily Received e-scribe refill request for Metoprolol Tartrate 25mg  two times a day. This does not match the med sheet, which shows once a day. Spoke to patient and was informed that she is taking this once a day and that the med sheet is correct. Please send in new rx for once daily.    Method Requested: Electronic Initial call taken by: Sydell Axon LPN,  August 29, 2010 9:12 AM  Follow-up for Phone Call       Follow-up by: Crawford Givens MD,  August 29, 2010 9:30 AM    Prescriptions: METOPROLOL TARTRATE 25 MG  TABS (METOPROLOL TARTRATE) one tab by mouth daily  #30 x 11   Entered and Authorized by:   Crawford Givens MD   Signed by:   Crawford Givens MD on 08/29/2010   Method used:   [facility]ronically to        [facility]  [address] [phone]* (retail)       [address]       [place], [place]  [address]       Ph: [phone]       Fax: [phone]   RxID:   [id number]

## 2011-02-05 NOTE — Letter (Signed)
Summary: Vida Rigger MD/[facility] MD/[facility]   Imported By: Lester Como 01/27/2011 10:49:54  _____________________________________________________________________  External Attachment:    Type:   Image     Comment:   External Document

## 2011-03-07 NOTE — Op Note (Signed)
NAMEMERRIANNE, Hannah Pitts              ACCOUNT NO.:  [id number]  MEDICAL RECORD NO.:  [id number]          PATIENT TYPE:  AMB  LOCATION:  ENDO                         FACILITY:  [facility]  PHYSICIAN:  Petra Kuba, M.D.    DATE OF BIRTH:  03/16/1946  DATE OF PROCEDURE: DATE OF DISCHARGE:                              OPERATIVE REPORT   PROCEDURE:  Esophagogastroduodenoscopy with Savary dilatation.  INDICATION:  The patient with dysphagia and abnormal barium swallow. Consent was signed after risks, benefits, methods, options thoroughly discussed prior to sedation and in the office.  MEDICINES USED:  Fentanyl 75 mcg, Versed 6 mg.  PROCEDURE:  The video endoscope was inserted by direct vision.  No obvious upper esophageal sphincter web or stricture was seen.  The scope passed into the esophagus, which did have a moderate amount of spasm, but no signs of esophagitis or abnormality until we advanced to the distal esophagus where a fairly tight fibrous ring was seen and with gentle pressure and minimal resistance, the scope could pass through it into the stomach.  There was minimal trauma to the ring on passing the scope.  The scope was advanced through a normal antrum, normal pylorus into a normal duodenal bulb and around the C-loop to a normal second portion of the duodenum.  The scope was withdrawn back to the bulb which was normal.  Scope was withdrawn back to the stomach and retroflexed. There was a minimal amount of heme coming from the cardia, but the cardia and fundus were normal as was the angularis, lesser and greater curve on retroflex visualization.  Straight visualization of the stomach did not reveal any additional findings.  Scope was slowly withdrawn through the esophagus.  She did have a small hiatal hernia just below the distal fibrous ring,  which did have some trauma from passing the scope, but no active bleeding.  The rest of the esophagus was evaluated back to the upper  esophageal sphincter and no additional findings were seen.  Scope was reinserted through the esophagus and advanced to the antrum.  The customary J loop of the scope was confirmed under fluoro. The Savary wire was advanced into the antrum and confirmed endoscopically under fluoro.  The scope was removed making sure to keep the wire in the proper location and once the scope was removed, the Savary 12 and 14 mm dilators were both advanced over the wire into the stomach and confirmed in proper position under fluoro.  There was no resistance in passing both dilators, but there was trace heme on both dilators.  We elected to stop the procedure.  Once the 14 was advanced, the wire was withdrawn back into the dilator, both were removed in tandem.  The procedure was terminated at this juncture.  The patient tolerated the procedure well.  There was no obvious immediate complication.  ENDOSCOPIC DIAGNOSES: 1. Spastic esophagus. 2. Distal ring fairly tight with minimal resistance and trauma with     passing the scope. 3. Tiny small hiatal hernia. 4. Otherwise normal esophagogastroduodenoscopy.  THERAPY:  Savary dilatation to 14 mm under fluoro without resistance and minimal heme.  PLAN:  Continue Prilosec, slowly advance diet.  Call me p.r.n. particularly if swallowing no better and consider more aggressive dilatation,  otherwise follow up in 6 weeks to check on her.  Hold aspirin for 3 days.          ______________________________ Petra Kuba, M.D.     MEM/MEDQ  D:  01/24/2011  T:  01/25/2011  Job:  [id number]  cc:   Arta Silence, MD  Electronically Signed by Vida Rigger M.D. on 03/06/2011 07:58:15 AM

## 2011-03-19 LAB — CBC
HCT: 39.3 % (ref 36.0–46.0)
Hemoglobin: 13.3 g/dL (ref 12.0–15.0)
MCHC: 33.8 g/dL (ref 30.0–36.0)
MCV: 89 fL (ref 78.0–100.0)
RBC: 4.41 MIL/uL (ref 3.87–5.11)
RDW: 12.9 % (ref 11.5–15.5)
WBC: 3.8 10*3/uL — ABNORMAL LOW (ref 4.0–10.5)

## 2011-03-19 LAB — URINALYSIS, ROUTINE W REFLEX MICROSCOPIC
Nitrite: NEGATIVE
Specific Gravity, Urine: 1.022 (ref 1.005–1.030)
Urobilinogen, UA: 0.2 mg/dL (ref 0.0–1.0)

## 2011-03-19 LAB — DIFFERENTIAL
Basophils Absolute: 0 10*3/uL (ref 0.0–0.1)
Basophils Relative: 0 % (ref 0–1)
Neutro Abs: 2.5 10*3/uL (ref 1.7–7.7)
Neutrophils Relative %: 65 % (ref 43–77)

## 2011-03-19 LAB — COMPREHENSIVE METABOLIC PANEL
Alkaline Phosphatase: 80 U/L (ref 39–117)
BUN: 19 mg/dL (ref 6–23)
Chloride: 108 mEq/L (ref 96–112)
Glucose, Bld: 90 mg/dL (ref 70–99)
Potassium: 5 mEq/L (ref 3.5–5.1)
Total Bilirubin: 0.6 mg/dL (ref 0.3–1.2)

## 2011-03-19 LAB — URINE MICROSCOPIC-ADD ON

## 2011-03-20 ENCOUNTER — Encounter: Payer: Self-pay | Admitting: Family Medicine

## 2011-03-20 ENCOUNTER — Telehealth: Payer: Self-pay | Admitting: *Deleted

## 2011-03-20 NOTE — Telephone Encounter (Signed)
Pt has jury duty on 4/18 and is asking for a letter excusing her for medical reasons.  Please call when ready and she will pick up.

## 2011-03-21 NOTE — Telephone Encounter (Signed)
LMOM  Advising pt letter is ready for pick up.

## 2011-03-21 NOTE — Telephone Encounter (Signed)
Have done letter. Please give to pt.

## 2011-03-27 ENCOUNTER — Ambulatory Visit
Admission: RE | Admit: 2011-03-27 | Discharge: 2011-03-27 | Disposition: A | Discharge status: Home / Self Care | Payer: Self-pay | Source: Ambulatory Visit | Attending: Family Medicine | Admitting: Family Medicine

## 2011-03-27 DIAGNOSIS — Z1239 Encounter for other screening for malignant neoplasm of breast: Secondary | ICD-10-CM

## 2011-04-02 ENCOUNTER — Other Ambulatory Visit: Payer: Self-pay | Admitting: *Deleted

## 2011-04-02 MED ORDER — LEVOTHYROXINE SODIUM 112 MCG PO TABS: 112.0000 ug | ORAL_TABLET | Freq: Every day | ORAL | Status: DC

## 2011-04-29 ENCOUNTER — Other Ambulatory Visit: Payer: Self-pay | Admitting: Family Medicine

## 2011-05-13 NOTE — Assessment & Plan Note (Signed)
 HEALTHCARE                            CARDIOLOGY OFFICE NOTE   Hannah, Pitts                     MRN:          [id number]  DATE:05/20/2007                            DOB:          Oct 13, 1946    REASON FOR VISIT:  Hannah Pitts is seen for followup.  I saw her on April 14, 2007.  She was bothered by palpitations.  I felt that adding low  dose beta blocker was a good idea and  to see her back for followup.  We  know that on a Holter monitor she had she had PAC's and PVC's and some  atrial couplets. She had no high-grade arrhythmia's.   Since I saw her last she still has a jittering sensation in her chest.  Her palpitations may be somewhat better.  Otherwise she is doing well.   PAST MEDICAL HISTORY:   ALLERGIES:  No known drug allergies.   MEDICATIONS:  Calcium, Plaquenil, multivitamins, Tramadol, Synthroid,  prednisone, aspirin, metoprolol and Effexor.   OTHER MEDICAL PROBLEMS:  See problem list below.   REVIEW OF SYSTEMS:  See the history of present illness.   PHYSICAL EXAMINATION:  VITAL SIGNS:  Blood pressure today is 138/78 with  a pulse of 68.  GENERAL APPEARANCE:  Patient is oriented to person, time and place and  her affect is normal.  HEENT:  She had no xanthelasma.  She has normal extraocular motion.  NECK:  There are no carotid bruits.  There is no jugular venous  distention.  CARDIAC:  Exam reveals an S1 with an S2.  There are no clicks or  significant murmurs.  LUNGS:  Clear.  She has no respiratory distress.  ABDOMEN:  Soft.  EXTREMITIES:  She has no significant peripheral edema.   PROBLEM LIST:  Her problems include:  1. Jittering sensation in her chest.  2. History of ejection fraction of 55% in August of 2007.  3. History of mild mitral valve prolapse and mild mitral      regurgitation.  4. History of PAC's and PVC's and some atrial couplets.  5. Hypothyroidism on medication.  6. Prednisone therapy.  7. History  of Systemic lupus erythematosus.  8. History of a pericardial window in the past (or pericardial      effusion).  9. History of Meniere's disease.  10.Some chest discomfort over time.  There has been no proven coronary      disease.  11.Gastroesophageal reflux disease.  12.History of a Schatzki's ring in the past.  13.Status post cholecystectomy and lumbar surgery and hysterectomy      with her ovaries intact.  14.Good left ventricular function.  15.History of mild AI.   DISCUSSION:  I believe her cardiac status is stable.  No further cardiac  testing is indicated.  We will continue her low dose beta blockade and I  will see her back in six months.     Luis Abed, MD, [facility]  Electronically Signed    JDK/MedQ  DD: 05/20/2007  DT: 05/20/2007  Job #: [id number]   cc:   Arta Silence, MD

## 2011-05-16 NOTE — Procedures (Signed)
[place]  Patient:    Hannah Pitts, Hannah Pitts                     MRN: [id number] Proc. Date: 12/16/00 Adm. Date:  [id number] Disc. Date: [id number] Attending:  Charmaine Downs CC:         Ulyess Mort, M.D. [facility]   Procedure Report  PROCEDURE PERFORMED:  Esophageal manometry.  1 - Upper esophageal sphincter.  There appears to be a normal coordination between pharyngeal contraction and cricopharyngeal relaxation.  2 - Lower esophageal sphincter.  The lower esophageal sphincter pressure appears to be borderline high at approximately 40 to 45 mmHg with normal relaxation on swallowing.  3 - Motility pattern.  There are high amplitude peristaltic contractions throughout the length of the esophagus; however, these contractions have a distal esophageal mean amplitude of 302 mmHg, an average duration of contractions is prolonged to 6.5 seconds.  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 DD:  12/16/00 TD:  12/17/00 Job: [id number]

## 2011-05-16 NOTE — Assessment & Plan Note (Signed)
[facility]                            CARDIOLOGY OFFICE NOTE   TEA, COLLUMS                     MRN:          [id number]  DATE:04/14/2007                            DOB:          Feb 10, 1946    [name] is here for cardiology followup. I saw her last on November 17, 2005. See my complete notes concerning this. I have reviewed the  prior notes very carefully including her complete problem list.   The patient has had palpitations. Sometimes she feels a jittering in  her chest. Dr. Hetty Ely had ordered an echo in August of 2007. On August 25, 2006, showing an ejection fraction of 55%. There was mild mitral  valve prolapse with mild mitral regurgitation. A Holter monitor was  arranged also and this was done last in December of 2006. In fact, she  had another Holter monitor done in August of 2007. She had PACs and PVCs  and atrial couplets. She has no high-grade arrhythmias.   PAST MEDICAL HISTORY:   ALLERGIES:  No known drug allergies.   MEDICATIONS:  1. Calcium.  2. Plaquenil.  3. Multivitamin.  4. Tramadol.  5. Synthroid.  6. Prednisone 5.  7. Aspirin.   OTHER MEDICAL PROBLEMS:  See the prior complete list and the list will  be updated when I see her back in followup.   REVIEW OF SYSTEMS:  She has many things related to her lupus, but is  relatively stable as of today.   PHYSICAL EXAMINATION:  Blood pressure 135/80 with a pulse of 79. The  patient is oriented to person, time and place. Affect is normal. She  seems a little nervous.  SKIN: Reveals question of a butterfly rash on her face. She has no  xanthelasma. There is normal extraocular motion. There are no carotid  bruits. There is no jugular venous distention.  LUNGS:  Clear. Respiratory effort is not labored.  CARDIAC: Reveals an S1 with an S2. I do not hear any murmurs today.  ABDOMEN: Soft.  She has no peripheral edema.   EKG today reveals sinus rhythm.   The  major problem today is the sensation of palpitations. I believe that  she is stable. I do believe that the addition of a small amount of a  beta-blocker may help her and we have started it. I will see her back  for followup and then review all of her other issues. There is a history  of lupus and low-dose steroids and hypothyroidism and Meniere's disease.  Also, a nuclear study was done in the past. I will see her for followup.     Luis Abed, MD, [facility]  Electronically Signed    JDK/MedQ  DD: 04/14/2007  DT: 04/14/2007  Job #: [id number]   cc:   Arta Silence, MD

## 2011-05-21 ENCOUNTER — Encounter: Payer: Self-pay | Admitting: Family Medicine

## 2011-05-21 ENCOUNTER — Encounter: Payer: Self-pay | Admitting: *Deleted

## 2011-06-03 ENCOUNTER — Encounter: Payer: Self-pay | Admitting: Family Medicine

## 2011-06-04 ENCOUNTER — Encounter: Payer: Self-pay | Admitting: Family Medicine

## 2011-06-05 ENCOUNTER — Ambulatory Visit (INDEPENDENT_AMBULATORY_CARE_PROVIDER_SITE_OTHER): Payer: Medicare Other | Admitting: Family Medicine

## 2011-06-05 ENCOUNTER — Encounter: Payer: Self-pay | Admitting: Family Medicine

## 2011-06-05 VITALS — BP 138/90 | HR 84 | Temp 98.7°F | Ht 64.75 in | Wt 212.2 lb

## 2011-06-05 DIAGNOSIS — R21 Rash and other nonspecific skin eruption: Secondary | ICD-10-CM

## 2011-06-05 DIAGNOSIS — N390 Urinary tract infection, site not specified: Secondary | ICD-10-CM | POA: Insufficient documentation

## 2011-06-05 LAB — POCT URINALYSIS DIPSTICK: Ketones, UA: NEGATIVE

## 2011-06-05 MED ORDER — NYSTATIN 100000 UNIT/GM EX POWD: CUTANEOUS | Status: DC

## 2011-06-05 MED ORDER — NITROFURANTOIN MONOHYD MACRO 100 MG PO CAPS: 100.0000 mg | ORAL_CAPSULE | Freq: Two times a day (BID) | ORAL | Status: AC

## 2011-06-05 NOTE — Patient Instructions (Signed)
RTC tues for recheck

## 2011-06-05 NOTE — Progress Notes (Signed)
  Subjective:    Patient ID: Hannah Pitts, female    DOB: 1946-12-20, 65 y.o.   MRN: [id number]  HPI Pt here as followup visit but appears to be acute for UTI. She was seen by Dr Jimmy Footman in May and told she had a bad UTI and was put on Cipro. This was approx one month ago. She was on Cipro 500 bid for 5 days. She never had any sxs. Shew is feeling tired now and wonders if she might have recurrent infection.  She also has weak and trembly spells.Marland KitchenMarland KitchenSunday at church she had to go home early because she is so tired and weak. She has PE scheduled for next month and wants to hold off bloodwork until then. We discussed the fact that a UTI could cause her to feel poorly and her urine looks to possibly be infected. She has signif WBCs.  She also has a rash under the left breast. She had fungal infection there last year treated by Dr Para March with Nystatin which eventually went away but took a long time. The rash looks somewhat different today.    Review of SystemsNoncontributory except as above.       Objective:   Physical Exam  Constitutional: She appears well-developed and well-nourished. No distress.  HENT:  Head: Normocephalic and atraumatic.  Right Ear: External ear normal.  Left Ear: External ear normal.  Nose: Nose normal.  Mouth/Throat: Oropharynx is clear and moist. No oropharyngeal exudate.  Eyes: Conjunctivae and EOM are normal. Pupils are equal, round, and reactive to light.  Neck: Normal range of motion. Neck supple. No thyromegaly present.  Cardiovascular: Normal rate, regular rhythm and normal heart sounds.   Pulmonary/Chest: Effort normal and breath sounds normal. She has no wheezes. She has no rales.  Abdominal: She exhibits no distension. There is no tenderness.       Minimal suprapubic tenderness to palpation.  Musculoskeletal:       No CVAT  Lymphadenopathy:    She has no cervical adenopathy.  Skin: Skin is warm and dry. Rash noted. She is not diaphoretic. There is  erythema.       Below left breast maculopapular individual lesions with erythematous halo around each. Appear minimally vessicular.          Assessment & Plan:

## 2011-06-05 NOTE — Assessment & Plan Note (Signed)
Presumed infection causing systemic sxs. Will treat as infection and send urine for culture. Treat full course and follow up for Test of Cure if shows infection. Treated with Macrobid.

## 2011-06-05 NOTE — Assessment & Plan Note (Signed)
Could be atypical fungal infection below the left breast but also could be atypical cellulitis, poss MRSA by exam. Will treat as fungus with Nystatin and recheck next week. Change trmt if worsens. Add Cortaid to Nystatin after two days.

## 2011-06-07 LAB — URINE CULTURE

## 2011-06-10 ENCOUNTER — Ambulatory Visit (INDEPENDENT_AMBULATORY_CARE_PROVIDER_SITE_OTHER): Payer: Medicare Other | Admitting: Family Medicine

## 2011-06-10 ENCOUNTER — Encounter: Payer: Self-pay | Admitting: Family Medicine

## 2011-06-10 DIAGNOSIS — R21 Rash and other nonspecific skin eruption: Secondary | ICD-10-CM

## 2011-06-10 DIAGNOSIS — F411 Generalized anxiety disorder: Secondary | ICD-10-CM

## 2011-06-10 DIAGNOSIS — R5381 Other malaise: Secondary | ICD-10-CM

## 2011-06-10 DIAGNOSIS — R259 Unspecified abnormal involuntary movements: Secondary | ICD-10-CM

## 2011-06-10 DIAGNOSIS — F419 Anxiety disorder, unspecified: Secondary | ICD-10-CM | POA: Insufficient documentation

## 2011-06-10 DIAGNOSIS — N390 Urinary tract infection, site not specified: Secondary | ICD-10-CM

## 2011-06-10 NOTE — Progress Notes (Signed)
  Subjective:    Patient ID: Hannah Pitts, female    DOB: August 02, 1946, 65 y.o.   MRN: [id number]  HPI Pt here for followup. She was seen last week for UTI, given Macrobid. She was also feeling poorly and susp. UTI.  She also had atypical rash under the left breast suspected to be fungal due to location but concerned about MRSA from appearance. Here for recheck. She has had worsening of weak and trembly episodes at times  She stopped her Metoprolol which she was on for tremors but she thought that might be part of her feeling down and it has helped.  Nystatin has helped and rash is fading.    Review of Systems  Noncontributory     Objective:   Physical Exam  Constitutional: She appears well-developed and well-nourished. No distress.  HENT:  Head: Normocephalic and atraumatic.  Right Ear: External ear normal.  Left Ear: External ear normal.  Nose: Nose normal.  Mouth/Throat: Oropharynx is clear and moist. No oropharyngeal exudate.  Eyes: Conjunctivae and EOM are normal. Pupils are equal, round, and reactive to light.  Neck: Normal range of motion. Neck supple. No thyromegaly present.  Cardiovascular: Normal rate, regular rhythm and normal heart sounds.   Pulmonary/Chest: Effort normal and breath sounds normal. She has no wheezes. She has no rales.  Lymphadenopathy:    She has no cervical adenopathy.  Skin: She is not diaphoretic.          Assessment & Plan:

## 2011-06-10 NOTE — Assessment & Plan Note (Signed)
Stopped Metoprolol due to fatigue. BP fine. Discuss tremor if worsens with Dr Sandria Manly when she sees him.

## 2011-06-10 NOTE — Assessment & Plan Note (Signed)
She wants to stop Buspar. OK with me. Discussed slow taper. RTC if anxiety increases or depression worsens. Discussed SSRIs, check with insurance which are covered if needed.

## 2011-06-10 NOTE — Patient Instructions (Signed)
25 mins spent with pt.

## 2011-06-10 NOTE — Assessment & Plan Note (Signed)
Improved sxs. TOC at least 4 days after Abs finished.

## 2011-06-10 NOTE — Assessment & Plan Note (Signed)
Improved off Metoprolol.

## 2011-06-10 NOTE — Assessment & Plan Note (Signed)
Improved, not MRSA. Use cream one week after rash resolved.

## 2011-06-20 ENCOUNTER — Ambulatory Visit (INDEPENDENT_AMBULATORY_CARE_PROVIDER_SITE_OTHER): Payer: Medicare Other | Admitting: Family Medicine

## 2011-06-20 DIAGNOSIS — R300 Dysuria: Secondary | ICD-10-CM

## 2011-06-20 DIAGNOSIS — N390 Urinary tract infection, site not specified: Secondary | ICD-10-CM

## 2011-06-20 LAB — POCT URINALYSIS DIPSTICK
Ketones, UA: NEGATIVE
Protein, UA: 30
Spec Grav, UA: 1.03
pH, UA: 5

## 2011-06-20 NOTE — Progress Notes (Signed)
  Subjective:    Patient ID: Hannah Pitts, female    DOB: [birth year], 65 y.o.   MRN: [id number]  HPI Comments: Patient came in for urine recheck urine dipstick results in computer. Patient complains of urgency and burning a few days ago. [facility] uses [facility] [address]  Please clarify the symptoms- are the ongoing or did they resolve?  Please send for Ucx in meantime and then let me know about her symptoms.  GSD.    Review of Systems     Objective:   Physical Exam        Assessment & Plan:

## 2011-06-22 NOTE — Progress Notes (Signed)
  Subjective:    Patient ID: Hannah Pitts, female    DOB: 1946/11/28, 65 y.o.   MRN: [id number]  HPI    Review of Systems     Objective:   Physical Exam        Assessment & Plan:  Lab visit for repeat U/a and ucx.  See notes on labs.

## 2011-06-22 NOTE — Assessment & Plan Note (Signed)
See notes on labs. 

## 2011-07-07 ENCOUNTER — Other Ambulatory Visit: Payer: Self-pay

## 2011-07-16 ENCOUNTER — Other Ambulatory Visit: Payer: Self-pay | Admitting: Family Medicine

## 2011-07-16 DIAGNOSIS — C8589 Other specified types of non-Hodgkin lymphoma, extranodal and solid organ sites: Secondary | ICD-10-CM

## 2011-07-16 DIAGNOSIS — I10 Essential (primary) hypertension: Secondary | ICD-10-CM

## 2011-07-16 DIAGNOSIS — E039 Hypothyroidism, unspecified: Secondary | ICD-10-CM

## 2011-07-16 DIAGNOSIS — L930 Discoid lupus erythematosus: Secondary | ICD-10-CM

## 2011-07-17 ENCOUNTER — Other Ambulatory Visit (INDEPENDENT_AMBULATORY_CARE_PROVIDER_SITE_OTHER): Payer: Medicare Other

## 2011-07-17 DIAGNOSIS — Z79899 Other long term (current) drug therapy: Secondary | ICD-10-CM

## 2011-07-17 DIAGNOSIS — E039 Hypothyroidism, unspecified: Secondary | ICD-10-CM

## 2011-07-17 DIAGNOSIS — L930 Discoid lupus erythematosus: Secondary | ICD-10-CM

## 2011-07-17 DIAGNOSIS — C8589 Other specified types of non-Hodgkin lymphoma, extranodal and solid organ sites: Secondary | ICD-10-CM

## 2011-07-17 DIAGNOSIS — I10 Essential (primary) hypertension: Secondary | ICD-10-CM

## 2011-07-17 LAB — RENAL FUNCTION PANEL
BUN: 24 mg/dL — ABNORMAL HIGH (ref 6–23)
Chloride: 109 mEq/L (ref 96–112)
GFR: 50.62 mL/min — ABNORMAL LOW (ref >60.00)
Glucose, Bld: 87 mg/dL (ref 70–99)
Phosphorus: 3.3 mg/dL (ref 2.3–4.6)
Potassium: 4.4 mEq/L (ref 3.5–5.1)

## 2011-07-17 LAB — CBC WITH DIFFERENTIAL/PLATELET
Basophils Absolute: 0 10*3/uL (ref 0.0–0.1)
Basophils Relative: 0.3 % (ref 0.0–3.0)
Eosinophils Absolute: 0 10*3/uL (ref 0.0–0.7)
Lymphocytes Relative: 41.7 % (ref 12.0–46.0)
MCHC: 33.8 g/dL (ref 30.0–36.0)
Neutrophils Relative %: 47.5 % (ref 43.0–77.0)
Platelets: 128 10*3/uL — ABNORMAL LOW (ref 150.0–400.0)
RBC: 4.3 Mil/uL (ref 3.87–5.11)

## 2011-07-17 LAB — LIPID PANEL
Cholesterol: 163 mg/dL (ref 0–200)
HDL: 48.7 mg/dL (ref >39.00)
Triglycerides: 172 mg/dL — ABNORMAL HIGH (ref 0.0–149.0)

## 2011-07-17 LAB — HIGH SENSITIVITY CRP: CRP, High Sensitivity: 4.36 mg/L (ref 0.000–5.000)

## 2011-07-17 LAB — MAGNESIUM: Magnesium: 1.8 mg/dL (ref 1.5–2.5)

## 2011-07-17 LAB — HEPATIC FUNCTION PANEL
AST: 27 U/L (ref 0–37)
Albumin: 4 g/dL (ref 3.5–5.2)

## 2011-07-17 LAB — IRON: Iron: 66 ug/dL (ref 42–145)

## 2011-07-17 LAB — TSH: TSH: 2.89 u[IU]/mL (ref 0.35–5.50)

## 2011-07-17 LAB — T4, FREE: Free T4: 0.87 ng/dL (ref 0.60–1.60)

## 2011-07-17 NOTE — Progress Notes (Signed)
Addended by: Alvina Chou on: 07/17/2011 09:48 AM   Modules accepted: Orders

## 2011-07-24 ENCOUNTER — Ambulatory Visit (INDEPENDENT_AMBULATORY_CARE_PROVIDER_SITE_OTHER): Payer: Medicare Other | Admitting: Family Medicine

## 2011-07-24 ENCOUNTER — Encounter: Payer: Self-pay | Admitting: Family Medicine

## 2011-07-24 DIAGNOSIS — R6883 Chills (without fever): Secondary | ICD-10-CM

## 2011-07-24 DIAGNOSIS — R5383 Other fatigue: Secondary | ICD-10-CM

## 2011-07-24 DIAGNOSIS — R5381 Other malaise: Secondary | ICD-10-CM

## 2011-07-24 DIAGNOSIS — R1310 Dysphagia, unspecified: Secondary | ICD-10-CM

## 2011-07-24 DIAGNOSIS — C8589 Other specified types of non-Hodgkin lymphoma, extranodal and solid organ sites: Secondary | ICD-10-CM

## 2011-07-24 DIAGNOSIS — I10 Essential (primary) hypertension: Secondary | ICD-10-CM

## 2011-07-24 DIAGNOSIS — D638 Anemia in other chronic diseases classified elsewhere: Secondary | ICD-10-CM

## 2011-07-24 DIAGNOSIS — N390 Urinary tract infection, site not specified: Secondary | ICD-10-CM

## 2011-07-24 DIAGNOSIS — Z8669 Personal history of other diseases of the nervous system and sense organs: Secondary | ICD-10-CM

## 2011-07-24 NOTE — Assessment & Plan Note (Signed)
Stable, no progression 

## 2011-07-24 NOTE — Assessment & Plan Note (Signed)
Good control, cont curr meds. BP Readings from Last 3 Encounters:  07/24/11 122/80  06/10/11 128/84  06/05/11 138/90

## 2011-07-24 NOTE — Assessment & Plan Note (Signed)
New diagnosis and being followed by Dr Truett Perna.

## 2011-07-24 NOTE — Assessment & Plan Note (Signed)
Much better off Metoprolol and Buspar. BP well controlled (was on Metop for tremor) and no anxiety currently felt. Continue as is.

## 2011-07-24 NOTE — Assessment & Plan Note (Signed)
Good since esophagus stretched. Discussed ongoing sxs and poss need for repeat in the future.

## 2011-07-24 NOTE — Assessment & Plan Note (Signed)
No progression, longtime loss of hearing of left ear.

## 2011-07-24 NOTE — Assessment & Plan Note (Signed)
WBC and Plts slightly low, H/H good.

## 2011-07-24 NOTE — Progress Notes (Signed)
Subjective:    Patient ID: Hannah Pitts, female    DOB: 1946/04/20, 65 y.o.   MRN: [id number]  HPI Pt here for Comp Exam. She feels really better after going off Metoprolol and Buspar. She has no trouble with anxiety and has no further problems with her tremor. The weak trembly episodes are gone. She still has stumbling at times. Dr Herma Carson and myself have both put her on Abs for UTI. She still has sxs and will see Dr Isabel Caprice tomorrow.  She has had breast biopsy done with lymphoma, MALT type being followed by Dr Truett Perna. Thusfar expectant observation.   Review of Systems  Constitutional: Negative for fever, diaphoresis (resolved.), fatigue (resolved.) and unexpected weight change. Chills: longstanding.  HENT: Positive for hearing loss (Menieres with almost toal hearing loss in Left.). Negative for ear pain, rhinorrhea, trouble swallowing and tinnitus.   Eyes: Negative for pain, discharge and visual disturbance.       Dry eyes with Sjogren's   Respiratory: Negative for cough, shortness of breath and wheezing.   Cardiovascular: Negative for chest pain and palpitations.       No Fainting or Fatigue.  Gastrointestinal: Negative for nausea, vomiting, abdominal pain, diarrhea, constipation and blood in stool.       No Heartburn Had esophagus stretched in the future.  Genitourinary: Positive for hematuria. Negative for dysuria and frequency.       To see Dr Isabel Caprice.  Musculoskeletal: Positive for arthralgias and gait problem (mildly stumbly.). Negative for myalgias and back pain.  Skin: Negative for rash.       No Itching or Dryness.  Neurological: Positive for numbness (in toes ). Negative for tremors.       No Tingling. Mild Balance Problems.  Hematological: Negative for adenopathy. Does not bruise/bleed easily.  Psychiatric/Behavioral: Negative for dysphoric mood and agitation.       Objective:   Physical Exam  Constitutional: She is oriented to person, place, and time. She appears  well-developed and well-nourished. No distress.  HENT:  Head: Normocephalic and atraumatic.  Left Ear: External ear normal.  Nose: Nose normal.  Mouth/Throat: Oropharynx is clear and moist. No oropharyngeal exudate.  Eyes: Conjunctivae and EOM are normal. Pupils are equal, round, and reactive to light. No scleral icterus.  Neck: Normal range of motion. Neck supple. No thyromegaly present.  Cardiovascular: Normal rate, regular rhythm and normal heart sounds.  Exam reveals no friction rub.   No murmur heard. Pulmonary/Chest: Effort normal and breath sounds normal. No respiratory distress. She has no wheezes. She has no rales.  Abdominal: Soft. Bowel sounds are normal. She exhibits no mass. There is no tenderness.  Genitourinary: Guaiac negative stool.  Musculoskeletal: Normal range of motion. She exhibits no edema and no tenderness.  Lymphadenopathy:    She has no cervical adenopathy.  Neurological: She is alert and oriented to person, place, and time. She has normal reflexes.  Skin: Skin is warm and dry. No rash noted. She is not diaphoretic. No erythema.  Psychiatric: She has a normal mood and affect. Her behavior is normal. Judgment and thought content normal.  Pt declines breast, pelvic and rectal exams.        Assessment & Plan:  HMPE   I have personally reviewed the Medicare Annual Wellness questionnaire and have noted 1. The patient's medical and social history 2. Their use of alcohol, tobacco or illicit drugs 3. Their current medications and supplements 4. The patient's functional ability including ADL's, fall risks, home safety  risks and hearing or visual             impairment. 5. Diet and physical activities 6. Evidence for depression or mood disorders

## 2011-07-24 NOTE — Assessment & Plan Note (Signed)
To see  Dr Isabel Caprice tomm for prolonged sxs.

## 2011-07-29 ENCOUNTER — Other Ambulatory Visit: Payer: Self-pay | Admitting: Family Medicine

## 2011-08-26 ENCOUNTER — Other Ambulatory Visit: Payer: Self-pay | Admitting: Family Medicine

## 2012-02-03 ENCOUNTER — Encounter: Payer: Self-pay | Admitting: Family Medicine

## 2012-02-03 ENCOUNTER — Ambulatory Visit (INDEPENDENT_AMBULATORY_CARE_PROVIDER_SITE_OTHER): Payer: Medicare Other | Admitting: Family Medicine

## 2012-02-03 DIAGNOSIS — R071 Chest pain on breathing: Secondary | ICD-10-CM

## 2012-02-03 DIAGNOSIS — I319 Disease of pericardium, unspecified: Secondary | ICD-10-CM

## 2012-02-03 DIAGNOSIS — R0789 Other chest pain: Secondary | ICD-10-CM

## 2012-02-03 DIAGNOSIS — R21 Rash and other nonspecific skin eruption: Secondary | ICD-10-CM

## 2012-02-03 MED ORDER — NYSTATIN 100000 UNIT/GM EX CREA: TOPICAL_CREAM | Freq: Two times a day (BID) | CUTANEOUS | Status: DC

## 2012-02-03 NOTE — Progress Notes (Signed)
H/o gall bladder surgery years ago.  Now with a new spot that gets sore.  She isn't sure if it bulges.  Sometimes are worse than others.  No FNAVD.  H/o fungal infection under the breasts.  Prev treated with nystatin.  Needs refill.  Has returned.   Pain in back, worse with deep breath.  Episodic pain, sore.  Going on for weeks.    She's had a few palpitations w/o chest pain episodically over the last few years.    Meds, vitals, and allergies reviewed.   ROS: See HPI.  Otherwise, noncontributory.  nad ncat Neck supple rrr ctab Back pain worse with deep breath but better with compression of paraspinal muscles in L T spine abd benign, soft, no hernia noted on exam Ext well perfused Fungal rash on upper abd, lower chest wall.

## 2012-02-03 NOTE — Patient Instructions (Signed)
I would use a heating pad on your back.  Let me know if the abdominal symptoms continues.  Use the nystatin and call about seeing Dr. Myrtis Ser.  Take care.

## 2012-02-04 DIAGNOSIS — R0789 Other chest pain: Secondary | ICD-10-CM | POA: Insufficient documentation

## 2012-02-04 NOTE — Assessment & Plan Note (Signed)
Better with compression, I think this is likely MSK in origin and she'll monitor this.  F/u prn.  I also don't feel a hernia and abd exam is benign, will follow clinically.

## 2012-02-04 NOTE — Assessment & Plan Note (Signed)
Restart nystatin and keep area dry.

## 2012-02-04 NOTE — Assessment & Plan Note (Signed)
H/o, now with occ palpitations.  No exertional chest pain and regular, not tachy on exam today.  She'll f/u with cards .

## 2012-02-23 ENCOUNTER — Other Ambulatory Visit: Payer: Self-pay | Admitting: *Deleted

## 2012-02-23 MED ORDER — LEVOTHYROXINE SODIUM 112 MCG PO TABS: 112.0000 ug | ORAL_TABLET | Freq: Every day | ORAL | Status: DC

## 2012-03-03 ENCOUNTER — Other Ambulatory Visit: Payer: Self-pay | Admitting: Family Medicine

## 2012-03-03 DIAGNOSIS — Z1231 Encounter for screening mammogram for malignant neoplasm of breast: Secondary | ICD-10-CM

## 2012-03-23 ENCOUNTER — Other Ambulatory Visit: Payer: Self-pay

## 2012-03-23 ENCOUNTER — Emergency Department (HOSPITAL_COMMUNITY)
Admission: EM | Admit: 2012-03-23 | Discharge: 2012-03-23 | Disposition: A | Discharge status: Home / Self Care | Payer: Medicare Other | Attending: Emergency Medicine | Admitting: Emergency Medicine

## 2012-03-23 ENCOUNTER — Encounter (HOSPITAL_COMMUNITY): Payer: Self-pay | Admitting: *Deleted

## 2012-03-23 ENCOUNTER — Emergency Department (HOSPITAL_COMMUNITY): Payer: Medicare Other

## 2012-03-23 DIAGNOSIS — I10 Essential (primary) hypertension: Secondary | ICD-10-CM | POA: Insufficient documentation

## 2012-03-23 DIAGNOSIS — I471 Supraventricular tachycardia, unspecified: Secondary | ICD-10-CM | POA: Insufficient documentation

## 2012-03-23 DIAGNOSIS — M329 Systemic lupus erythematosus, unspecified: Secondary | ICD-10-CM | POA: Insufficient documentation

## 2012-03-23 DIAGNOSIS — M069 Rheumatoid arthritis, unspecified: Secondary | ICD-10-CM | POA: Insufficient documentation

## 2012-03-23 DIAGNOSIS — I4891 Unspecified atrial fibrillation: Secondary | ICD-10-CM

## 2012-03-23 DIAGNOSIS — I739 Peripheral vascular disease, unspecified: Secondary | ICD-10-CM | POA: Insufficient documentation

## 2012-03-23 DIAGNOSIS — F341 Dysthymic disorder: Secondary | ICD-10-CM | POA: Insufficient documentation

## 2012-03-23 DIAGNOSIS — R002 Palpitations: Secondary | ICD-10-CM

## 2012-03-23 DIAGNOSIS — Z79899 Other long term (current) drug therapy: Secondary | ICD-10-CM | POA: Insufficient documentation

## 2012-03-23 DIAGNOSIS — Z7982 Long term (current) use of aspirin: Secondary | ICD-10-CM | POA: Insufficient documentation

## 2012-03-23 DIAGNOSIS — R079 Chest pain, unspecified: Secondary | ICD-10-CM | POA: Insufficient documentation

## 2012-03-23 DIAGNOSIS — R0602 Shortness of breath: Secondary | ICD-10-CM | POA: Insufficient documentation

## 2012-03-23 DIAGNOSIS — K219 Gastro-esophageal reflux disease without esophagitis: Secondary | ICD-10-CM | POA: Insufficient documentation

## 2012-03-23 HISTORY — DX: Personal history of other diseases of the digestive system: Z87.19

## 2012-03-23 HISTORY — DX: Non-Hodgkin lymphoma, unspecified, unspecified site: C85.90

## 2012-03-23 HISTORY — DX: Meniere's disease, left ear: H81.02

## 2012-03-23 LAB — CBC
HCT: 39.7 % (ref 36.0–46.0)
Hemoglobin: 13.3 g/dL (ref 12.0–15.0)
MCHC: 33.5 g/dL (ref 30.0–36.0)
RDW: 12.9 % (ref 11.5–15.5)
WBC: 5.1 10*3/uL (ref 4.0–10.5)

## 2012-03-23 LAB — BASIC METABOLIC PANEL
Chloride: 103 mEq/L (ref 96–112)
GFR calc Af Amer: 47 mL/min — ABNORMAL LOW (ref >90)
GFR calc non Af Amer: 40 mL/min — ABNORMAL LOW (ref >90)
Potassium: 3.9 mEq/L (ref 3.5–5.1)
Sodium: 141 mEq/L (ref 135–145)

## 2012-03-23 LAB — POCT I-STAT TROPONIN I: Troponin i, poc: 0.01 ng/mL (ref 0.00–0.08)

## 2012-03-23 LAB — TROPONIN I: Troponin I: <0.3 ng/mL (ref <0.30)

## 2012-03-23 MED ORDER — ASPIRIN 325 MG PO TABS
325.0000 mg | ORAL_TABLET | ORAL | Status: AC
  Administered 2012-03-23: 325 mg via ORAL
  Filled 2012-03-23: qty 1

## 2012-03-23 NOTE — ED Provider Notes (Signed)
Medical screening examination/treatment/procedure(s) were conducted as a shared visit with non-physician practitioner(s) and myself.  I personally evaluated the patient during the encounter 9 y female with lupus c/o fluttering in her heart for 2 d.  Denies pain. Denies n/v/sweating or light headedness. Has had DOE for past few days.  Has hx of irregular heart beat in past.  Is on bblocker.   asx now.  ecg shows aflutter.  Will check labs and discuss with cards.   Cheri Guppy, MD 03/23/12 1200

## 2012-03-23 NOTE — ED Notes (Signed)
Patient report given to Toniann Fail, RN for [facility].  Patient transferred at this time.

## 2012-03-23 NOTE — ED Notes (Signed)
Patient reports she has hx of lupus.  She has experienced intermittent episodes of her heart fluttering.  She states she noticed last night her sx persisted.  She states the fluttering is worse when she is lying down. Patient denies chest pain.  Patient states she is more tired than usual.

## 2012-03-23 NOTE — ED Provider Notes (Signed)
History     CSN: [id number]  Arrival date & time 03/23/12  4098   First MD Initiated Contact with Patient 03/23/12 0957      Chief Complaint  Patient presents with  . Chest Pain  . Shortness of Breath    (Consider location/radiation/quality/duration/timing/severity/associated sxs/prior treatment) Patient is a 66 y.o. female presenting with chest pain and shortness of breath. The history is provided by the patient.  Chest Pain The chest pain began 2 days ago. Chest pain occurs frequently. The chest pain is worsening. Associated with: She reports a  history of lupus that she has mild chest tightness with chronically, however, over the last 2-3 days she has had a change in symptoms. Chest pain is worsened by certain positions and exertion. Primary symptoms include shortness of breath and palpitations. Pertinent negatives for primary symptoms include no fever.  The palpitations also occurred with shortness of breath.  Pertinent negatives for associated symptoms include no lower extremity edema, no near-syncope and no weakness. Associated symptoms comments: She feels her heart is racing and skipping beats and that it is worse when she lies down. She experiences SOB with symptoms. No recent cough or fever. No history of similar symptoms.. She tried nothing for the symptoms.  Her past medical history is significant for thyroid problem. Past medical history comments: History of Lupus, GERD  Procedure history is positive for echocardiogram.  Procedure history is negative for cardiac catheterization.    Shortness of Breath  Associated symptoms include chest pain and shortness of breath. Pertinent negatives include no fever. Past medical history comments: History of Lupus, GERD.    Past Medical History  Diagnosis Date  . Systemic lupus erythematosus   . Shortness of breath   . Other malaise and fatigue   . Generalized hyperhidrosis   . Dysthymic disorder   . Unspecified disorder of skin and  subcutaneous tissue   . Urinary tract infection, site not specified   . Essential hypertension, benign   . Abnormal involuntary movements   . Depressive disorder, not elsewhere classified   . Urticaria, unspecified   . Unspecified disease of pericardium   . Anemia of other chronic disease   . Diaphragmatic hernia without mention of obstruction or gangrene   . Lupus erythematosus   . Other rheumatoid arthritis with visceral or systemic involvement   . Personal history of other disorders of nervous system and sense organs   . Peripheral vascular disease, unspecified   . Peripheral vascular disease, unspecified   . Unspecified hypothyroidism   . GERD (gastroesophageal reflux disease)     Past Surgical History  Procedure Date  . Cholecystectomy 1974  . Abdominal hysterectomy   . Lumbar disc surgery   . Shunt     left ear   . Esophagogastroduodenoscopy 01-24-11    Slight stenosis esoph spasm  . Breast biopsy     left breast - mass Lymphoma MALT type    Family History  Problem Relation Age of Onset  . Heart failure Mother   . Stroke Father   . Coronary artery disease Sister   . COPD Sister   . Coronary artery disease Brother   . Diabetes Brother   . Heart failure Brother   . Coronary artery disease Sister   . Coronary artery disease Sister   . Cancer Sister   . Heart failure Sister   . Cancer Sister     lung, esoph  . Cancer Sister     lung  History  Substance Use Topics  . Smoking status: Never Smoker   . Smokeless tobacco: Never Used  . Alcohol Use: No    OB History    Grav Para Term Preterm Abortions TAB SAB Ect Mult Living                  Review of Systems  Constitutional: Negative for fever and chills.  HENT: Negative.   Respiratory: Positive for shortness of breath.   Cardiovascular: Positive for chest pain and palpitations. Negative for near-syncope.  Gastrointestinal: Negative.   Musculoskeletal: Negative.   Skin: Negative.   Neurological:  Negative.  Negative for weakness.    Allergies  Review of patient's allergies indicates no known allergies.  Home Medications   Current Outpatient Rx  Name Route Sig Dispense Refill  . ASPIRIN 81 MG PO TBEC Oral Take 81 mg by mouth daily.      Marland Kitchen VITAMIN D 1000 UNITS PO CAPS  1 once daily by mouth     . HYDROXYCHLOROQUINE SULFATE 200 MG PO TABS Oral Take by mouth 2 (two) times daily.      Marland Kitchen LEVOTHYROXINE SODIUM 112 MCG PO TABS Oral Take 1 tablet (112 mcg total) by mouth daily. 30 tablet 6  . MULTIVITAMINS PO Oral Take by mouth daily.      . NYSTATIN 100000 UNIT/GM EX CREA Topical Apply 1 application topically 2 (two) times daily.    Marland Kitchen OMEPRAZOLE 40 MG PO CPDR Oral Take 40 mg by mouth daily.    Marland Kitchen PREDNISONE 1 MG PO TABS  4 mg daily by mouth       BP 176/82  Pulse 75  Temp(Src) 98.3 F (36.8 C) (Oral)  Resp 20  Ht 5\' 5"  (1.651 m)  Wt 208 lb (94.348 kg)  BMI 34.61 kg/m2  SpO2 98%  Physical Exam  Constitutional: She appears well-developed and well-nourished.  HENT:  Head: Normocephalic.  Neck: Normal range of motion. Neck supple.  Cardiovascular: Normal rate and normal pulses.  An irregular rhythm present.  Pulmonary/Chest: Effort normal and breath sounds normal.  Abdominal: Soft. Bowel sounds are normal. There is no tenderness. There is no rebound and no guarding.  Musculoskeletal: Normal range of motion.  Neurological: She is alert. No cranial nerve deficit.  Skin: Skin is warm and dry. No rash noted.  Psychiatric: She has a normal mood and affect.    ED Course  Procedures (including critical care time)  Labs Reviewed  BASIC METABOLIC PANEL - Abnormal; Notable for the following:    BUN 32 (*)    Creatinine, Ser 1.33 (*)    GFR calc non Af Amer 40 (*)    GFR calc Af Amer 47 (*)    All other components within normal limits  PRO B NATRIURETIC PEPTIDE - Abnormal; Notable for the following:    Pro B Natriuretic peptide (BNP) 133.5 (*)    All other components within  normal limits  CBC  TROPONIN I  POCT I-STAT TROPONIN I  TSH   Results for orders placed during the hospital encounter of 03/23/12  CBC      Component Value Range   WBC 5.1  4.0 - 10.5 (K/uL)   RBC 4.67  3.87 - 5.11 (MIL/uL)   Hemoglobin 13.3  12.0 - 15.0 (g/dL)   HCT 16.1  09.6 - 04.5 (%)   MCV 85.0  78.0 - 100.0 (fL)   MCH 28.5  26.0 - 34.0 (pg)   MCHC 33.5  30.0 -  36.0 (g/dL)   RDW 16.1  09.6 - 04.5 (%)   Platelets 150  150 - 400 (K/uL)  BASIC METABOLIC PANEL      Component Value Range   Sodium 141  135 - 145 (mEq/L)   Potassium 3.9  3.5 - 5.1 (mEq/L)   Chloride 103  96 - 112 (mEq/L)   CO2 28  19 - 32 (mEq/L)   Glucose, Bld 97  70 - 99 (mg/dL)   BUN 32 (*) 6 - 23 (mg/dL)   Creatinine, Ser 4.09 (*) 0.50 - 1.10 (mg/dL)   Calcium 9.5  8.4 - 81.1 (mg/dL)   GFR calc non Af Amer 40 (*) >90 (mL/min)   GFR calc Af Amer 47 (*) >90 (mL/min)  PRO B NATRIURETIC PEPTIDE      Component Value Range   Pro B Natriuretic peptide (BNP) 133.5 (*) 0 - 125 (pg/mL)  TROPONIN I      Component Value Range   Troponin I <0.30  <0.30 (ng/mL)  POCT I-STAT TROPONIN I      Component Value Range   Troponin i, poc 0.01  0.00 - 0.08 (ng/mL)   Comment 3             Dg Chest 2 View  03/23/2012  *RADIOLOGY REPORT*  Clinical Data: Shortness of breath.  Chest palpitations.  CHEST - 2 VIEW  Comparison: PA and lateral chest 04/03/2010.  Findings: Mild elevation of the right hemidiaphragm is unchanged. Lungs are clear.  No pneumothorax or effusion.  Heart size is upper normal.  IMPRESSION: No acute finding.  Stable compared to prior exam.  Original Report Authenticated By: Bernadene Bell. Maricela Curet, M.D.     No diagnosis found. 1. New onset atrial fibrillation   MDM  Patient resting for duration of ED visit without complaint. Still feels heart racing but comfortable. To be admitted         Rodena Medin, PA-C 03/23/12 1342  Rodena Medin, PA-C 03/23/12 1342

## 2012-03-23 NOTE — ED Notes (Signed)
Spoke with Dr. Lynelle Doctor re: plan of care, paging [place] re: consult

## 2012-03-23 NOTE — Progress Notes (Signed)
Discharge Instructions that were Provided To the Patient:  Discharge Instructions for [facility] Emergency Room 03/23/12  You have 1 new daily medicine: Cardizem CD 120mg  - one tablet by mouth daily. You should continue your other home medications.  Follow Up: [facility] [address] [place], [place] [address] [phone]  You will have an echocardiogram (heart ultrasound) on 04/21/12 at 2:00pm, then will follow up with Dr. Johney Frame at 3:30pm that day.  Feel free to call our office if you have any problems in the meantime.  Make sure to follow up with your primary care doctor to recheck your kidney function - your BUN/Cr levels were minimally elevated and need to be rechecked. We encourage gentle rehydration and pushing fluids the next 24 hours.

## 2012-03-23 NOTE — ED Notes (Signed)
Dinner tray delivered.

## 2012-03-23 NOTE — ED Provider Notes (Signed)
Medical screening examination/treatment/procedure(s) were performed by non-physician practitioner and as supervising physician I was immediately available for consultation/collaboration.   Dayton Bailiff, MD 03/23/12 [phone]

## 2012-03-23 NOTE — H&P (Signed)
History and Physical  Patient ID: [facility] MRN: [id number], SOB: Apr 19, 1946 66 y.o. Date of Encounter: 03/23/2012, 4:36 PM  Primary Physician: Crawford Givens, MD, MD Primary Cardiologist: Previously seen by Dr. Myrtis Ser, now scheduled to meet with Dr. Clifton James  Chief Complaint: palpitations  HPI: 66 y/o female with no known cardiac disease with hx lupus who presented with complaints of palpitations which she describes as a "trembling" or "pounding" on the left side of her chest. The pt states that palpitations began yesterday after lunch and persisted through the night, prompting her to come to the ED, but she has had intermittent palpitations for some time. These palpitations were associated with some SOB. There was some question of CP raised by the ER, but she denies this to [place] multiple times. She denies experiencing dizziness, pre-syncope, or syncope. She has not had any recent illnesses. Typically, she experiences palpitations 2-3 times per day for periods of 15-20 minutes. She denies any particular pattern and notes that they are not necessarily exacerbated by activity. She was recently seen by her PCP who referred her to cardiology due to c/o palpitations. The pt says she does not believe she has been diagnosed with atrial fib or flutter in the past, though she has experienced palpitations over the last 5 years for which she saw Dr. Myrtis Ser (Holter monitoring at that time showed PACs & PVCs and some atrial couplets). She took metoprolol for a period of time but stopped because it made her feel "jittery."  She notes some fatigue and increased DOE over the past couple of weeks but she says that she attributes this to lupus. She denies lower extremity swelling. She does report some PND and has not slept flat for many years--she sleeps with two pillows and has not required an increase in pillows recently.  In ED, pt was found to be in atrial flutter with ventricular rate of 93. Her rate has continued  in the 60s-80s without intervention. POC troponin was negative. Currently, the patient is not experiencing any palpitations but she notes that they have been coming and going since arrival to ED. She denies chest pain or SOB. No dizziness.  Past Medical History  Diagnosis Date  . Systemic lupus erythematosus   . Generalized hyperhidrosis   . Dysthymic disorder   . Abnormal involuntary movements   . Depressive disorder, not elsewhere classified   . Urticaria, unspecified   . Unspecified disease of pericardium     Pericarditis   . Anemia of other chronic disease   . Diaphragmatic hernia without mention of obstruction or gangrene   . Other rheumatoid arthritis with visceral or systemic involvement   . Personal history of other disorders of nervous system and sense organs   . Unspecified hypothyroidism   . GERD (gastroesophageal reflux disease)   . Atrial fibrillation and flutter   . Meniere's disease of left ear   . H/O hiatal hernia   . Migraine     "used to have classic kind; not anymore; last one was in 2012"  . Lymphoma 03/2010    "they got it all w/surgery"   Under history, "PVD" was previously listed although pt denied history of this  Surgical History:  Past Surgical History  Procedure Date  . Cholecystectomy 1974  . Lumbar disc surgery 1977  . Shunt     put in behind left ear ; Menier's syndrome  . Esophagogastroduodenoscopy 01-24-11    Slight stenosis esoph spasm  . Appendectomy 1974  .  Breast biopsy 03/2010    left breast;   . Breast lumpectomy 03/2010    left breast; nonHodgkins lymphoma; "not breast cancer"  . Abdominal hysterectomy 1978  . Left vats 12/1997  . Esophageal dilation ?02/2011     Home Meds: Medication Sig  aspirin (ASPIRIN LOW DOSE) 81 MG EC tablet Take 81 mg by mouth daily.    Cholecalciferol (VITAMIN D) 1000 UNITS capsule 1 once daily by mouth   hydroxychloroquine (PLAQUENIL) 200 MG tablet Take by mouth 2 (two) times daily.    levothyroxine  (SYNTHROID, LEVOTHROID) 112 MCG tablet Take 1 tablet (112 mcg total) by mouth daily.  Multiple Vitamin (MULTIVITAMINS PO) Take by mouth daily.    nystatin cream (MYCOSTATIN) Apply 1 application topically 2 (two) times daily.  omeprazole (PRILOSEC) 40 MG capsule Take 40 mg by mouth daily.  predniSONE (DELTASONE) 1 MG tablet 4 mg daily by mouth     Allergies: No Known Allergies  History   Social History  . Marital Status: Widowed    Spouse Name: N/A    Number of Children: 2  . Years of Education: N/A   Occupational History  . Disabled     Social History Main Topics  . Smoking status: Never Smoker   . Smokeless tobacco: Never Used  . Alcohol Use: No  . Drug Use: No  . Sexually Active: No   Other Topics Concern  . Not on file   Social History Narrative   [facility] lives in [place], [place]. She is widowed with 2 daughers - she lives with one of her daughters. 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      Family History  Problem Relation Age of Onset  . Heart failure Mother   . Stroke Father   . Coronary artery disease Sister   . COPD Sister   . Coronary artery disease Brother   . Diabetes Brother   . Heart failure Brother   . Coronary artery disease Sister   . Coronary artery disease Sister   . Cancer Sister   . Heart failure Sister   . Cancer Sister     lung, esoph  . Cancer Sister     lung    Review of Systems: General: +++ chills which the pt describes as chronic 2/2 lupus. No fever, night sweats or weight changes.  Cardiovascular: Negative for chest pain, edema, orthopnea. +++ palpitations, +++ occasional paroxysmal nocturnal dyspnea, +++ shortness of breath, +++ dyspnea on exertion Dermatological: negative for rash Respiratory: negative for cough or wheezing Urologic: negative for hematuria Abdominal: negative for nausea, vomiting, diarrhea, bright red blood per rectum, melena, or hematemesis Neurologic: negative for visual  changes, syncope, or dizziness All other systems reviewed and are otherwise negative except as noted above.  Labs:   Lab Results  Component Value Date   WBC 5.1 03/23/2012   HGB 13.3 03/23/2012   HCT 39.7 03/23/2012   MCV 85.0 03/23/2012   PLT 150 03/23/2012     Lab 03/23/12 0948  NA 141  K 3.9  CL 103  CO2 28  BUN 32*  CREATININE 1.33*  CALCIUM 9.5  PROT --  BILITOT --  ALKPHOS --  ALT --  AST --  GLUCOSE 97    Basename 03/23/12 0948  CKTOTAL --  CKMB --  TROPONINI <0.30    Radiology/Studies:   03/23/2012 CHEST - 2 VIEW  Comparison: PA and lateral chest 04/03/2010. Clinical Data: Shortness of breath.  Chest palpitations.  Findings: Mild elevation of the right hemidiaphragm is unchanged. Lungs are clear.  No pneumothorax or effusion.  Heart size is upper normal.  IMPRESSION: No acute finding.  Stable compared to prior exam.  Original Report Authenticated By: Bernadene Bell. D'ALESSIO, M.D.    EKG: atrial tachycardia with variable AV block, atrial rate ~ 300, ventricular rate 93. Telemetry shows similar rhythm with intermittent NSR.  ECHO (08/25/2006) - Overall left ventricular systolic function was normal. Left ventricular ejection fraction was estimated to be 55 %. There were no left ventricular regional wall motion abnormalities. - Mild diastolic dysfunction. - There was mild aortic valvular regurgitation. - There was a mild mitral valve prolapse involving the anterior and posterior leaflets. There was mild mitral valvular regurgitation.  Physical Exam: Blood pressure 144/72, pulse 65, temperature 98.1 F (36.7 C), temperature source Oral, resp. rate 16, height 5\' 5"  (1.651 m), weight 208 lb (94.348 kg), SpO2 99.00%. General: Well developed, well nourished, in no acute distress. Head: Normocephalic, atraumatic, sclera non-icteric, no xanthomas, nares are without discharge. Slight malar erythema.   Neck: Negative for carotid bruits. JVD not elevated. Lungs: Clear  bilaterally to auscultation without wheezes, rales, or rhonchi. Breathing is unlabored. Heart: RRR with S, loud S2. No murmurs, rubs, or gallops appreciated. Abdomen: Soft, non-tender, non-distended with normoactive bowel sounds. No hepatomegaly. No rebound/guarding. No obvious abdominal masses. Msk:  Strength and tone appear normal for age. Extremities: No clubbing or cyanosis. No edema.  Distal pedal pulses are 2+ and equal bilaterally. Neuro: Alert and oriented X 3. Moves all extremities spontaneously. Psych:  Responds to questions appropriately with a normal affect.   ASSESSMENT AND PLAN: The patient was seen initially by Latina Craver, PA-S then assessed by myself and Dr. Gala Romney. See comprehensive thoughts below.  1. Paroxysmal atrial tachycardia: Presumed new official diagnosis however pt has been having longstanding palpitations. TSH is normal. Electrolytes are WNL. Admission is not warranted at this time and we feel she can be safely discharged home. Per discussion with Dr. Gala Romney, will treat with Diltiazem CD 120mg  daily and have her f/u with EP with an echocardiogram in the office.  2. Lupus: Continue home meds.  3. Elevated Cr: Question component of dehydration given elevated BUN and creatinine. Gentle rehydration encouraged. With hx of lupus, would advise close outpatient followup (patient aware).  Signed, Ronie Spies PA-C 03/23/2012, 4:36 PM  Patient seen and examined with Ronie Spies PA-C. We discussed all aspects of the encounter. I agree with the assessment and plan as stated above. ECGs and telemetry reviewed appears to have PSVT. Discussed options of medical therapy versus ablation. At this time would start Cardizem 120 CD and check echo. We have made f/u appt with her to see Dr. Johney Frame on 4/24 @ 3:30PM.   Truman Hayward 5:05 PM

## 2012-03-23 NOTE — ED Notes (Signed)
Pt hooked up to cardiac monitor, pt in a flutter at this time, pt hx of a flutter, pt asymptomatic, pt A&O x4, follows commands, speaks in complete sentences, will continue to monitor, NAD

## 2012-03-23 NOTE — ED Notes (Signed)
Dinner tray ordered, Heart healthy.

## 2012-03-29 ENCOUNTER — Ambulatory Visit
Admission: RE | Admit: 2012-03-29 | Discharge: 2012-03-29 | Disposition: A | Discharge status: Home / Self Care | Payer: Medicare Other | Source: Ambulatory Visit | Attending: Family Medicine | Admitting: Family Medicine

## 2012-03-29 ENCOUNTER — Encounter: Payer: Self-pay | Admitting: *Deleted

## 2012-03-29 DIAGNOSIS — Z1231 Encounter for screening mammogram for malignant neoplasm of breast: Secondary | ICD-10-CM

## 2012-03-31 ENCOUNTER — Encounter: Payer: Self-pay | Admitting: Family Medicine

## 2012-03-31 ENCOUNTER — Ambulatory Visit (INDEPENDENT_AMBULATORY_CARE_PROVIDER_SITE_OTHER): Payer: Medicare Other | Admitting: Family Medicine

## 2012-03-31 VITALS — BP 140/82 | HR 72 | Temp 98.4°F | Wt 214.2 lb

## 2012-03-31 DIAGNOSIS — I471 Supraventricular tachycardia: Secondary | ICD-10-CM

## 2012-03-31 DIAGNOSIS — R000 Tachycardia, unspecified: Secondary | ICD-10-CM

## 2012-03-31 LAB — BASIC METABOLIC PANEL
BUN: 24 mg/dL — ABNORMAL HIGH (ref 6–23)
Calcium: 9.2 mg/dL (ref 8.4–10.5)
Creatinine, Ser: 1.2 mg/dL (ref 0.4–1.2)
GFR: 47.15 mL/min — ABNORMAL LOW (ref >60.00)
Glucose, Bld: 91 mg/dL (ref 70–99)

## 2012-03-31 NOTE — Progress Notes (Signed)
She was at [facility] ER with palpitations, d/x with PAT but was okay for discharge to home.  Started on diltiazem in meantime but has been about to come off for 3 days w/o sx.  She was told it was okay to take prn for sx.  No CP, is mildly SOB with exertion but this is at baseline.  No edema.  She can feel the episodes when they occur.  Has f/u with Dr. Johney Frame pending.  We talked about the basics of atrial tachycardia and her meds today.  ER notes reviewed.    She had a mildly elevated BUN at ER and needs this rechecked.  Her back pain from the last OV resolved.    Meds, vitals, and allergies reviewed.   ROS: See HPI.  Otherwise, noncontributory.  nad ncat Mmm rrr ctab Ext w/o edema

## 2012-03-31 NOTE — Patient Instructions (Signed)
Take the cardiazem as needed.  Avoid caffeine in the meantime.  Keep the appointment with cardiology.  Take care. We'll contact you with your lab report.

## 2012-03-31 NOTE — Assessment & Plan Note (Signed)
Will use PRN CCB for now and f/u with cards.  Will recheck BMET given the h/o SLE.  Path/phys d/w pt and okay for outpatient f/u.  See notes on labs when resulted.  >25 min spent with face to face with patient, >50% counseling and/or coordinating care

## 2012-04-01 ENCOUNTER — Encounter: Payer: Self-pay | Admitting: *Deleted

## 2012-04-15 ENCOUNTER — Ambulatory Visit: Payer: Medicare Other | Admitting: Cardiovascular Disease

## 2012-04-20 ENCOUNTER — Other Ambulatory Visit: Payer: Self-pay | Admitting: *Deleted

## 2012-04-20 MED ORDER — LEVOTHYROXINE SODIUM 112 MCG PO TABS: 112.0000 ug | ORAL_TABLET | Freq: Every day | ORAL | Status: DC

## 2012-04-20 MED ORDER — OMEPRAZOLE 40 MG PO CPDR: 40.0000 mg | DELAYED_RELEASE_CAPSULE | Freq: Every day | ORAL | Status: DC

## 2012-04-20 NOTE — Telephone Encounter (Signed)
Patient advised.  Says she will call for appt.

## 2012-04-20 NOTE — Telephone Encounter (Signed)
Look like patient has not had physical since 2011 but, has been in for acute visit okay to refill?

## 2012-04-20 NOTE — Telephone Encounter (Signed)
Schedule a physical this summer. Thanks.  meds sent in meantime.

## 2012-04-21 ENCOUNTER — Other Ambulatory Visit: Payer: Self-pay | Admitting: *Deleted

## 2012-04-21 ENCOUNTER — Other Ambulatory Visit: Payer: Self-pay

## 2012-04-21 ENCOUNTER — Encounter: Payer: Self-pay | Admitting: Internal Medicine

## 2012-04-21 ENCOUNTER — Ambulatory Visit (INDEPENDENT_AMBULATORY_CARE_PROVIDER_SITE_OTHER): Payer: Medicare Other | Admitting: Internal Medicine

## 2012-04-21 ENCOUNTER — Ambulatory Visit (HOSPITAL_COMMUNITY): Payer: Medicare Other | Attending: Cardiovascular Disease

## 2012-04-21 ENCOUNTER — Other Ambulatory Visit (HOSPITAL_COMMUNITY): Payer: Self-pay | Admitting: Radiology

## 2012-04-21 VITALS — BP 157/90 | HR 73 | Resp 18 | Ht 65.0 in | Wt 208.4 lb

## 2012-04-21 DIAGNOSIS — I10 Essential (primary) hypertension: Secondary | ICD-10-CM

## 2012-04-21 DIAGNOSIS — I471 Supraventricular tachycardia: Secondary | ICD-10-CM

## 2012-04-21 DIAGNOSIS — R0989 Other specified symptoms and signs involving the circulatory and respiratory systems: Secondary | ICD-10-CM | POA: Insufficient documentation

## 2012-04-21 DIAGNOSIS — I059 Rheumatic mitral valve disease, unspecified: Secondary | ICD-10-CM | POA: Insufficient documentation

## 2012-04-21 DIAGNOSIS — R002 Palpitations: Secondary | ICD-10-CM

## 2012-04-21 DIAGNOSIS — M329 Systemic lupus erythematosus, unspecified: Secondary | ICD-10-CM | POA: Insufficient documentation

## 2012-04-21 DIAGNOSIS — R5381 Other malaise: Secondary | ICD-10-CM | POA: Insufficient documentation

## 2012-04-21 DIAGNOSIS — I519 Heart disease, unspecified: Secondary | ICD-10-CM | POA: Insufficient documentation

## 2012-04-21 DIAGNOSIS — I359 Nonrheumatic aortic valve disorder, unspecified: Secondary | ICD-10-CM

## 2012-04-21 DIAGNOSIS — R0609 Other forms of dyspnea: Secondary | ICD-10-CM | POA: Insufficient documentation

## 2012-04-21 DIAGNOSIS — R0602 Shortness of breath: Secondary | ICD-10-CM

## 2012-04-21 MED ORDER — DILTIAZEM HCL 120 MG PO TABS: 120.0000 mg | ORAL_TABLET | Freq: Every day | ORAL | Status: DC | PRN

## 2012-04-21 MED ORDER — OMEPRAZOLE 40 MG PO CPDR: 40.0000 mg | DELAYED_RELEASE_CAPSULE | Freq: Every day | ORAL | Status: DC

## 2012-04-21 NOTE — Telephone Encounter (Signed)
Received faxed request from pharmacy requesting to change Omeprazole from a 30 day supply to a 90 day. Refill sent to pharmacy electronically.

## 2012-04-21 NOTE — Telephone Encounter (Signed)
.   Requested Prescriptions   Signed Prescriptions Disp Refills  . diltiazem (CARDIZEM) 120 MG tablet 30 tablet 6    Sig: Take 1 tablet (120 mg total) by mouth daily as needed.    Authorizing Provider: Hillis Range    Ordering User: Lacie Scotts

## 2012-04-21 NOTE — Patient Instructions (Signed)
Your physician wants you to follow-up in: 6 months with Dr. Allred. You will receive a reminder letter in the mail two months in advance. If you don't receive a letter, please call our office to schedule the follow-up appointment.  

## 2012-04-22 ENCOUNTER — Encounter: Payer: Self-pay | Admitting: Internal Medicine

## 2012-04-22 NOTE — Assessment & Plan Note (Signed)
Continue diltiazem Salt restriction

## 2012-04-22 NOTE — Progress Notes (Signed)
Primary Care Physician: Crawford Givens, MD, MD Referring Physician:  Dr Gilford Raid is a 66 y.o. female with a h/o multiple chronic medical issues as well as palpitations who presents for EP consultation regarding her palpitations.  She was recently seen in the ER at [facility].  At that time, she reports palpitations for 24 hours with mild SOB.  She denies dizziness, pre-syncope, or syncope. She reported episodes 2-3 times per day for periods of 15-20 minutes. She has had palpitations off and on for the last 5 years for which she saw Dr. Myrtis Ser initially.  A Holter monitor at that time recorded PACs & PVCs and some atrial couplets.  She was placed on metoprolol but did not tolerate this medicine.    In ED 03/23/12, she had an ekg obtained.  This was felt to be atrial flutter with a ventricular rate of 93 bpm, though upon my review, this ekg represents artifact.  She was placed on cardizem and has done well since that time.  She feels that her palpitations are improved.   Past Medical History  Diagnosis Date  . Systemic lupus erythematosus   . Generalized hyperhidrosis   . Dysthymic disorder   . Abnormal involuntary movements   . Depressive disorder, not elsewhere classified   . Urticaria, unspecified   . Unspecified disease of pericardium     Pericarditis   . Anemia of other chronic disease   . Diaphragmatic hernia without mention of obstruction or gangrene   . Other rheumatoid arthritis with visceral or systemic involvement   . Personal history of other disorders of nervous system and sense organs   . Unspecified hypothyroidism   . GERD (gastroesophageal reflux disease)   . Meniere's disease of left ear   . H/O hiatal hernia   . Migraine     "used to have classic kind; not anymore; last one was in 2012"  . Lymphoma 03/2010    "they got it all w/surgery"  . Palpitations    Past Surgical History  Procedure Date  . Cholecystectomy 1974  . Lumbar disc surgery 1977  .  Shunt     put in behind left ear ; Menier's syndrome  . Esophagogastroduodenoscopy 01-24-11    Slight stenosis esoph spasm  . Appendectomy 1974  . Breast biopsy 03/2010    left breast;   . Breast lumpectomy 03/2010    left breast; nonHodgkins lymphoma; "not breast cancer"  . Abdominal hysterectomy 1978  . Left vats 12/1997  . Esophageal dilation ?02/2011    Current Outpatient Prescriptions  Medication Sig Dispense Refill  . aspirin (ASPIRIN LOW DOSE) 81 MG EC tablet Take 81 mg by mouth daily.        . Cholecalciferol (VITAMIN D) 1000 UNITS capsule 1 once daily by mouth       . hydroxychloroquine (PLAQUENIL) 200 MG tablet Take by mouth 2 (two) times daily.        Marland Kitchen levothyroxine (SYNTHROID, LEVOTHROID) 112 MCG tablet Take 1 tablet (112 mcg total) by mouth daily.  30 tablet  5  . Multiple Vitamin (MULTIVITAMINS PO) Take by mouth daily.        Marland Kitchen nystatin cream (MYCOSTATIN) Apply 1 application topically 2 (two) times daily.      Marland Kitchen omeprazole (PRILOSEC) 40 MG capsule Take 1 capsule (40 mg total) by mouth daily.  90 capsule  1  . predniSONE (DELTASONE) 1 MG tablet 4 mg daily by mouth       .  diltiazem (CARDIZEM) 120 MG tablet Take 1 tablet (120 mg total) by mouth daily as needed.  30 tablet  6    No Known Allergies  History   Social History  . Marital Status: Widowed    Spouse Name: N/A    Number of Children: 2  . Years of Education: N/A   Occupational History  . Disabled     Social History Main Topics  . Smoking status: Never Smoker   . Smokeless tobacco: Never Used  . Alcohol Use: No  . Drug Use: No  . Sexually Active: No   Other Topics Concern  . Not on file   Social History Narrative   Ms. Skeels lives in [place], [place]. She is widowed with 2 daughers - she lives with one of her daughters. 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     Family History  Problem Relation Age of Onset  . Heart failure Mother   . Stroke Father   . Coronary  artery disease Sister   . COPD Sister   . Coronary artery disease Brother   . Diabetes Brother   . Heart failure Brother   . Coronary artery disease Sister   . Coronary artery disease Sister   . Cancer Sister   . Heart failure Sister   . Cancer Sister     lung, esoph  . Cancer Sister     lung    ROS- All systems are reviewed and negative except as per the HPI above  Physical Exam: Filed Vitals:   04/21/12 1556  BP: 157/90  Pulse: 73  Resp: 18  Height: 5\' 5"  (1.651 m)  Weight: 208 lb 6.4 oz (94.53 kg)    GEN- The patient is well appearing, alert and oriented x 3 today.   Head- normocephalic, atraumatic Eyes-  Sclera clear, conjunctiva pink Ears- hearing intact Oropharynx- clear Neck- supple, no JVP Lymph- no cervical lymphadenopathy Lungs- Clear to ausculation bilaterally, normal work of breathing Heart- Regular rate and rhythm, no murmurs, rubs or gallops, PMI not laterally displaced GI- soft, NT, ND, + BS Extremities- no clubbing, cyanosis, or edema   EKG from 03/23/12 is reviewed and reveals artifact with underlying sinus rhyhtm. ekg today reveals sinus rhythm 66 bpm, poor R wave progression Echo from today is reviewed and reveals EF 60% with mild AI  Assessment and Plan:

## 2012-04-22 NOTE — Assessment & Plan Note (Signed)
Echo reviewed and normal

## 2012-04-22 NOTE — Assessment & Plan Note (Signed)
The patient has longstanding palpitations.  Prior holter reveals PACs/ PVCs.  She recently presented to [facility] with similar symptoms.  Dr Graciela Husbands and I have reviewed the ER ekg which was felt to be "atrial flutter".  This is actually sinus rhythm with lead artifact.  I do not see any other documentation of arrhythmias. Clinically, her palpitations have improved with cardizem. She does not wish to wear another event monitor at this time. No further workup is therefore planned.

## 2012-04-23 ENCOUNTER — Other Ambulatory Visit: Payer: Self-pay | Admitting: *Deleted

## 2012-04-23 ENCOUNTER — Other Ambulatory Visit: Payer: Self-pay | Admitting: Internal Medicine

## 2012-04-23 MED ORDER — DILTIAZEM HCL ER COATED BEADS 120 MG PO CP24: 120.0000 mg | ORAL_CAPSULE | Freq: Every day | ORAL | Status: DC

## 2012-04-23 MED ORDER — DILTIAZEM HCL ER 120 MG PO CP12: 120.0000 mg | ORAL_CAPSULE | Freq: Every day | ORAL | Status: DC

## 2012-04-23 NOTE — Telephone Encounter (Signed)
Spoke with [facility], pt needed the Cardizem CD 120mg  (24 hour tablet), per Tresa Endo this was supposed to have been corrected yesterday.  Verbally gave the order to the pharmacy for Cardizem CD 120mg .

## 2012-07-19 ENCOUNTER — Other Ambulatory Visit: Payer: Self-pay | Admitting: Family Medicine

## 2012-07-19 DIAGNOSIS — I10 Essential (primary) hypertension: Secondary | ICD-10-CM

## 2012-07-22 ENCOUNTER — Other Ambulatory Visit (INDEPENDENT_AMBULATORY_CARE_PROVIDER_SITE_OTHER): Payer: Medicare Other

## 2012-07-22 DIAGNOSIS — I10 Essential (primary) hypertension: Secondary | ICD-10-CM

## 2012-07-22 LAB — LIPID PANEL
HDL: 48.1 mg/dL (ref >39.00)
LDL Cholesterol: 90 mg/dL (ref 0–99)
Total CHOL/HDL Ratio: 3
Triglycerides: 123 mg/dL (ref 0.0–149.0)

## 2012-07-22 LAB — HEPATIC FUNCTION PANEL
Albumin: 3.7 g/dL (ref 3.5–5.2)
Total Protein: 6.6 g/dL (ref 6.0–8.3)

## 2012-07-26 ENCOUNTER — Encounter: Payer: Self-pay | Admitting: Family Medicine

## 2012-07-26 ENCOUNTER — Ambulatory Visit (INDEPENDENT_AMBULATORY_CARE_PROVIDER_SITE_OTHER): Payer: Medicare Other | Admitting: Family Medicine

## 2012-07-26 VITALS — BP 130/82 | HR 70 | Temp 98.2°F | Ht 65.0 in | Wt 200.0 lb

## 2012-07-26 DIAGNOSIS — R002 Palpitations: Secondary | ICD-10-CM

## 2012-07-26 DIAGNOSIS — Z23 Encounter for immunization: Secondary | ICD-10-CM

## 2012-07-26 DIAGNOSIS — K449 Diaphragmatic hernia without obstruction or gangrene: Secondary | ICD-10-CM

## 2012-07-26 DIAGNOSIS — M329 Systemic lupus erythematosus, unspecified: Secondary | ICD-10-CM

## 2012-07-26 DIAGNOSIS — Z0000 Encounter for general adult medical examination without abnormal findings: Secondary | ICD-10-CM

## 2012-07-26 DIAGNOSIS — E039 Hypothyroidism, unspecified: Secondary | ICD-10-CM

## 2012-07-26 DIAGNOSIS — Z1211 Encounter for screening for malignant neoplasm of colon: Secondary | ICD-10-CM

## 2012-07-26 MED ORDER — OMEPRAZOLE 40 MG PO CPDR: 40.0000 mg | DELAYED_RELEASE_CAPSULE | Freq: Every day | ORAL | Status: DC

## 2012-07-26 MED ORDER — LEVOTHYROXINE SODIUM 112 MCG PO TABS: 112.0000 ug | ORAL_TABLET | Freq: Every day | ORAL | Status: DC

## 2012-07-26 NOTE — Patient Instructions (Signed)
Go to the lab on the way out.  We'll contact you with your lab report. Don't change your meds.  Recheck in 1 year.  Take care.

## 2012-07-26 NOTE — Progress Notes (Signed)
I have personally reviewed the Medicare Annual Wellness questionnaire and have noted 1. The patient's medical and social history 2. Their use of alcohol, tobacco or illicit drugs 3. Their current medications and supplements 4. The patient's functional ability including ADL's, fall risks, home safety risks and hearing or visual             impairment. 5. Diet and physical activities 6. Evidence for depression or mood disorders  The patients weight, height, BMI have been recorded in the chart and visual acuity is per eye clinic.  I have made referrals, counseling and provided education to the patient based review of the above and I have provided the pt with a written personalized care plan for preventive services.  See scanned forms.  Routine anticipatory guidance given to patient.  See health maintenance. Tetanus 2011 Flu yearly Shingles not done, h/o RA and was prev on prednisone.  She may need to restart prednisone.  PNA 2013 Colonoscopy prev done.  D/w patient [id number] for colon cancer screening, including IFOB vs. colonoscopy.  Risks and benefits of both were discussed and patient voiced understanding.  Pt elects [id number].  Breast cancer screening at breast center, up to date. Advance directive discussed.   Pap not needed.  DXA per rheumatology  GERD and h/o HH controlled with PPI w/o complications.    Hypothyroid.  TSH prev wnl. Labs d/w pt. No neck mass or dysphagia.   Elevated TG resolved with intentional weight loss.  Labs discussed.    RA/Lupus per rheum. Off pred currently.   No more palpitations with cutting out caffeine.  Feeling well.   PMH and SH reviewed  Meds, vitals, and allergies reviewed.   ROS: See HPI.  Otherwise negative.    GEN: nad, alert and oriented HEENT: mucous membranes moist, no TMG NECK: supple w/o LA CV: rrr. PULM: ctab, no inc wob ABD: soft, +bs EXT: no edema SKIN: no acute rash Chronic changes to IP joints noted.

## 2012-07-27 ENCOUNTER — Encounter: Payer: Self-pay | Admitting: Family Medicine

## 2012-07-27 DIAGNOSIS — Z0000 Encounter for general adult medical examination without abnormal findings: Secondary | ICD-10-CM | POA: Insufficient documentation

## 2012-07-27 NOTE — Assessment & Plan Note (Signed)
Per rheumatology, currently of prednisone.

## 2012-07-27 NOTE — Assessment & Plan Note (Signed)
TSH wnl, continue current meds.  

## 2012-07-27 NOTE — Assessment & Plan Note (Signed)
Continue PPI and f/u prn for this.

## 2012-07-27 NOTE — Assessment & Plan Note (Signed)
Resolved off caffeine.  Doing well.  No meds currently.

## 2012-08-05 ENCOUNTER — Other Ambulatory Visit: Payer: Medicare Other

## 2012-08-05 DIAGNOSIS — Z1211 Encounter for screening for malignant neoplasm of colon: Secondary | ICD-10-CM

## 2012-08-05 LAB — FECAL OCCULT BLOOD, IMMUNOCHEMICAL: Fecal Occult Bld: NEGATIVE

## 2012-08-06 ENCOUNTER — Encounter: Payer: Self-pay | Admitting: *Deleted

## 2012-10-18 ENCOUNTER — Ambulatory Visit (INDEPENDENT_AMBULATORY_CARE_PROVIDER_SITE_OTHER): Payer: Medicare Other | Admitting: Internal Medicine

## 2012-10-18 ENCOUNTER — Encounter: Payer: Self-pay | Admitting: Internal Medicine

## 2012-10-18 VITALS — BP 118/68 | HR 67 | Ht 65.0 in | Wt 195.0 lb

## 2012-10-18 DIAGNOSIS — R002 Palpitations: Secondary | ICD-10-CM

## 2012-10-18 DIAGNOSIS — R0602 Shortness of breath: Secondary | ICD-10-CM

## 2012-10-18 NOTE — Progress Notes (Signed)
PCP: Crawford Givens, MD  Hannah Pitts is a 66 y.o. female who presents today for routine electrophysiology followup.  Since last being seen in our clinic, the patient reports doing very well.  She takes diltiazem as needed for symptomatic PACs.   Today, she denies symptoms of palpitations, chest pain, lower extremity edema, dizziness, presyncope, or syncope.  Her SOB has resolved.  She takes care of her 61 year old great grandchild without difficulty.   The patient is otherwise without complaint today.   Past Medical History  Diagnosis Date  . Systemic lupus erythematosus   . Generalized hyperhidrosis   . Dysthymic disorder   . Abnormal involuntary movements   . Depressive disorder, not elsewhere classified   . Urticaria, unspecified   . Unspecified disease of pericardium     Pericarditis   . Anemia of other chronic disease   . Diaphragmatic hernia without mention of obstruction or gangrene   . Other rheumatoid arthritis with visceral or systemic involvement   . Personal history of other disorders of nervous system and sense organs   . Unspecified hypothyroidism   . GERD (gastroesophageal reflux disease)   . Meniere's disease of left ear   . H/O hiatal hernia   . Migraine     "used to have classic kind; not anymore; last one was in 2012"  . Lymphoma 03/2010    "they got it all w/surgery"  . Palpitations    Past Surgical History  Procedure Date  . Cholecystectomy 1974  . Lumbar disc surgery 1977  . Shunt     put in behind left ear ; Menier's syndrome  . Esophagogastroduodenoscopy 01-24-11    Slight stenosis esoph spasm  . Appendectomy 1974  . Breast biopsy 03/2010    left breast;   . Breast lumpectomy 03/2010    left breast; nonHodgkins lymphoma; "not breast cancer"  . Abdominal hysterectomy 1978  . Left vats 12/1997  . Esophageal dilation ?02/2011    Current Outpatient Prescriptions  Medication Sig Dispense Refill  . aspirin (ASPIRIN LOW DOSE) 81 MG EC tablet Take 81 mg by  mouth daily.        . Cholecalciferol (VITAMIN D) 1000 UNITS capsule 1 once daily by mouth       . diltiazem (CARDIZEM CD) 120 MG 24 hr capsule Take 120 mg by mouth daily.      . hydroxychloroquine (PLAQUENIL) 200 MG tablet Take by mouth 2 (two) times daily.        Marland Kitchen levothyroxine (SYNTHROID, LEVOTHROID) 112 MCG tablet Take 1 tablet (112 mcg total) by mouth daily.  90 tablet  3  . Multiple Vitamin (MULTIVITAMINS PO) Take by mouth daily.        Marland Kitchen omeprazole (PRILOSEC) 40 MG capsule Take 1 capsule (40 mg total) by mouth daily.  90 capsule  3  . predniSONE (DELTASONE) 5 MG tablet Take 5 mg by mouth daily.        Physical Exam: Filed Vitals:   10/18/12 1017  BP: 118/68  Pulse: 67  Height: 5\' 5"  (1.651 m)  Weight: 195 lb (88.451 kg)  SpO2: 97%    GEN- The patient is well appearing, alert and oriented x 3 today.   Head- normocephalic, atraumatic Eyes-  Sclera clear, conjunctiva pink Ears- hearing intact Oropharynx- clear Lungs- Clear to ausculation bilaterally, normal work of breathing Heart- Regular rate and rhythm, no murmurs, rubs or gallops, PMI not laterally displaced GI- soft, NT, ND, + BS Extremities- no clubbing, cyanosis,  or edema  ekg today reveals sinus rhythm, left posterior fascicular block  Assessment and Plan:     Palpitations  The patient has longstanding palpitations. Prior holter reveals PACs/ PVCs. Clinically, her palpitations have improved with cardizem which she now takes as needed. No further workup is therefore planned.  HYPERTENSION, BENIGN ESSENTIAL   At goal  SHORTNESS OF BREATH   Resolved  Return in 1 year

## 2012-10-18 NOTE — Patient Instructions (Signed)
Your physician wants you to follow-up in: 12 months with Dr Allred You will receive a reminder letter in the mail two months in advance. If you don't receive a letter, please call our office to schedule the follow-up appointment.  

## 2012-12-03 ENCOUNTER — Ambulatory Visit (INDEPENDENT_AMBULATORY_CARE_PROVIDER_SITE_OTHER): Payer: Medicare Other | Admitting: Family Medicine

## 2012-12-03 ENCOUNTER — Encounter: Payer: Self-pay | Admitting: Family Medicine

## 2012-12-03 VITALS — BP 152/76 | HR 75 | Temp 98.9°F | Wt 198.0 lb

## 2012-12-03 DIAGNOSIS — R059 Cough, unspecified: Secondary | ICD-10-CM

## 2012-12-03 DIAGNOSIS — R05 Cough: Secondary | ICD-10-CM

## 2012-12-03 MED ORDER — AZITHROMYCIN 250 MG PO TABS: ORAL_TABLET | ORAL | Status: DC

## 2012-12-03 MED ORDER — BENZONATATE 200 MG PO CAPS: 200.0000 mg | ORAL_CAPSULE | Freq: Three times a day (TID) | ORAL | Status: AC | PRN

## 2012-12-03 MED ORDER — ALBUTEROL SULFATE HFA 108 (90 BASE) MCG/ACT IN AERS: 1.0000 | INHALATION_SPRAY | Freq: Four times a day (QID) | RESPIRATORY_TRACT | Status: DC | PRN

## 2012-12-03 NOTE — Patient Instructions (Addendum)
Drink plenty of fluids, take tessalon as needed for cough, and this should gradually improve.  Take care.  Let us know if you have other concerns.   If you have more wheezing, use the inhaler.  It may make the palpitations worse.  If you continue to have a productive cough for >1 week, then start the antibiotics and notify the clinic.

## 2012-12-03 NOTE — Progress Notes (Signed)
Sx started a few days ago.  Cough with deep breath.  Continued over the last few days.  Some wheeze last night.  Coughing in spurts/fits.  Some sputum, clear, scant amount.  No fevers.  Occ chills.  No sweats.  Fatigued.  No myalgias. No ear pain. Some rhinorrhea, clear.  No ST.  No diarrhea.    On plaquenil and prednisone.   Meds, vitals, and allergies reviewed.   ROS: See HPI.  Otherwise, noncontributory.  GEN: nad, alert and oriented HEENT: mucous membranes moist, tm w/o erythema, nasal exam w/o erythema, clear discharge noted,  OP with cobblestoning NECK: supple w/o LA CV: rrr.   PULM: ctab, no inc wob EXT: no edema SKIN: no acute rash

## 2012-12-05 DIAGNOSIS — R05 Cough: Secondary | ICD-10-CM | POA: Insufficient documentation

## 2012-12-05 NOTE — Assessment & Plan Note (Signed)
Nontoxic, likely viral.  Use tessalon for cough.  Would like to limit SABA due to h/o palpitations but if she has more cough/wheeze, this may be useful. Hold zmax for now and start if productive cough continues.  She agrees.  It is likely that she won't need abx.  F/u prn.

## 2013-02-21 ENCOUNTER — Other Ambulatory Visit: Payer: Self-pay | Admitting: Family Medicine

## 2013-02-21 DIAGNOSIS — Z1231 Encounter for screening mammogram for malignant neoplasm of breast: Secondary | ICD-10-CM

## 2013-03-30 ENCOUNTER — Ambulatory Visit
Admission: RE | Admit: 2013-03-30 | Discharge: 2013-03-30 | Disposition: A | Discharge status: Home / Self Care | Payer: Medicare Other | Source: Ambulatory Visit | Attending: Family Medicine | Admitting: Family Medicine

## 2013-03-30 DIAGNOSIS — Z1231 Encounter for screening mammogram for malignant neoplasm of breast: Secondary | ICD-10-CM

## 2013-05-20 ENCOUNTER — Ambulatory Visit (INDEPENDENT_AMBULATORY_CARE_PROVIDER_SITE_OTHER): Payer: Medicare Other | Admitting: Family Medicine

## 2013-05-20 ENCOUNTER — Encounter: Payer: Self-pay | Admitting: Family Medicine

## 2013-05-20 VITALS — BP 134/86 | HR 72 | Temp 98.1°F | Wt 211.5 lb

## 2013-05-20 DIAGNOSIS — R5381 Other malaise: Secondary | ICD-10-CM | POA: Insufficient documentation

## 2013-05-20 DIAGNOSIS — R8290 Unspecified abnormal findings in urine: Secondary | ICD-10-CM

## 2013-05-20 DIAGNOSIS — R82998 Other abnormal findings in urine: Secondary | ICD-10-CM

## 2013-05-20 DIAGNOSIS — R5383 Other fatigue: Secondary | ICD-10-CM

## 2013-05-20 LAB — COMPREHENSIVE METABOLIC PANEL
ALT: 21 U/L (ref 0–35)
CO2: 27 mEq/L (ref 19–32)
Calcium: 9.5 mg/dL (ref 8.4–10.5)
Chloride: 105 mEq/L (ref 96–112)
Creatinine, Ser: 1.2 mg/dL (ref 0.4–1.2)
GFR: 48.38 mL/min — ABNORMAL LOW (ref >60.00)
Glucose, Bld: 86 mg/dL (ref 70–99)

## 2013-05-20 LAB — CBC WITH DIFFERENTIAL/PLATELET
Basophils Absolute: 0 10*3/uL (ref 0.0–0.1)
Hemoglobin: 14.2 g/dL (ref 12.0–15.0)
Lymphocytes Relative: 19.4 % (ref 12.0–46.0)
Monocytes Relative: 4 % (ref 3.0–12.0)
Neutro Abs: 7.2 10*3/uL (ref 1.4–7.7)
Platelets: 180 10*3/uL (ref 150.0–400.0)
RDW: 13.4 % (ref 11.5–14.6)
WBC: 9.4 10*3/uL (ref 4.5–10.5)

## 2013-05-20 LAB — POCT URINALYSIS DIPSTICK
Bilirubin, UA: NEGATIVE
Blood, UA: NEGATIVE
Glucose, UA: NEGATIVE
Ketones, UA: NEGATIVE
Nitrite, UA: NEGATIVE
Spec Grav, UA: >=1.03
pH, UA: 6

## 2013-05-20 LAB — TSH: TSH: 0.35 u[IU]/mL (ref 0.35–5.50)

## 2013-05-20 NOTE — Assessment & Plan Note (Signed)
Mult possible causes. Check basic labs (cmet/cbc/tsh) today and ucx.  Would treat is positive.  Hold on treatment for now.  She agrees.  Nontoxic.  nonfocal exam.  Follow clinically o/w.  F/u if not improved.

## 2013-05-20 NOTE — Progress Notes (Signed)
"  I have no energy."  "I've been like that for a long time and I blame it on the lupus, but the last two weeks have been worse than normal."  She has been sitting more, resting more.  She was able to fertilize some plants yesterday in the yard, but was exhausted after that.  No pain.  She'll occ get SOB, occ noted last week, where she didn't feel like she had a good deep breath. That is resolved now. Not SOB now.  No SABA use.  No FNAVD.  She has had episodic chills frequently, this is at baseline and unchanged.  No rash.  No bloody/black stools.  She is hungry frequently, noted more recently.  Still on pred and plaquenil.  No CP.  No sig palpitations recently.  Still taking her thyroid medicine.  She didn't know if she had a UTI- she usually doesn't have sx except for a change in odor.  She has noted odor change recently.  Fatigue is worst in AM, better at the days go one.   Meds, vitals, and allergies reviewed.   ROS: See HPI.  Otherwise, noncontributory.  GEN: nad, alert and oriented HEENT: mucous membranes moist NECK: supple w/o LA CV: rrr.  no murmur PULM: ctab, no inc wob ABD: soft, +bs EXT: no edema SKIN: no acute rash Chronic IP joint changes noted on the hans.

## 2013-05-20 NOTE — Patient Instructions (Addendum)
Go to the lab on the way out.  We'll contact you with your lab report.  Don't change your meds for now.  Try to get some rest over the weekend.  Take care.

## 2013-05-21 LAB — URINE CULTURE

## 2013-05-30 ENCOUNTER — Other Ambulatory Visit: Payer: Self-pay | Admitting: Emergency Medicine

## 2013-05-30 MED ORDER — DILTIAZEM HCL ER COATED BEADS 120 MG PO CP24: 120.0000 mg | ORAL_CAPSULE | Freq: Every day | ORAL | Status: DC | PRN

## 2013-06-02 ENCOUNTER — Other Ambulatory Visit: Payer: Self-pay | Admitting: *Deleted

## 2013-06-02 NOTE — Telephone Encounter (Signed)
Refill already completed by C. Carollee Herter on 05/30/13

## 2013-07-20 ENCOUNTER — Encounter: Payer: Self-pay | Admitting: Physician Assistant

## 2013-07-20 ENCOUNTER — Ambulatory Visit (INDEPENDENT_AMBULATORY_CARE_PROVIDER_SITE_OTHER): Payer: Medicare Other | Admitting: Physician Assistant

## 2013-07-20 VITALS — BP 142/88 | HR 79 | Ht 65.0 in | Wt 214.0 lb

## 2013-07-20 DIAGNOSIS — R002 Palpitations: Secondary | ICD-10-CM

## 2013-07-20 DIAGNOSIS — R0609 Other forms of dyspnea: Secondary | ICD-10-CM | POA: Insufficient documentation

## 2013-07-20 DIAGNOSIS — E669 Obesity, unspecified: Secondary | ICD-10-CM | POA: Insufficient documentation

## 2013-07-20 NOTE — Assessment & Plan Note (Signed)
Patient has gained 20 pounds since October. I suspect this is causing many of her symptoms, I recommended a weight loss program such as Weight [employer]. Patient seems motivated to lose weight.

## 2013-07-20 NOTE — Assessment & Plan Note (Signed)
Patient has worsening symptoms of dyspnea on exertion over the past 3-4 months. I suspect some of the symptoms may be due to her weight gain. We will check a stress echo to rule out ischemia. I've asked her to start a weight loss program.

## 2013-07-20 NOTE — Patient Instructions (Addendum)
PLEASE SCHEDULE A STRESS ECHO DX DYSPNEA ON EXERTION  NO CHANGES WERE MADE TODAY

## 2013-07-20 NOTE — Progress Notes (Signed)
HPI:  This is a 67 year old female patient of Dr. Para March and Dr. Johney Frame who has history of symptomatic PACs and shortness of breath treated with Cardizem. She had a 2-D echo in April 2013 that showed normal LV function.  She comes in today complaining of 3-4 month history of significant weakness, feeling tired, dyspnea on exertion and shortness of breath after eating. She says her palpitations are only very minimal and don't seem to because of the problem. She has gained 20 pounds since she was seen here in October 2013. She says she is hungry all the time, never feels full and eats constantly. She denies any chest pain, dizziness, presyncope or syncope. She has no edema. She had full laboratory tests including TSH and may of this year by Dr. Para March and all was normal. She also saw her rheumatologist because of her history of lupus and he did not feel this was causing her symptoms.  No Known Allergies  Current Outpatient Prescriptions on File Prior to Visit: albuterol (PROAIR HFA) 108 (90 BASE) MCG/ACT inhaler, Inhale 1 puff into the lungs every 6 (six) hours as needed for wheezing., Disp: 18 g, Rfl: 1 aspirin (ASPIRIN LOW DOSE) 81 MG EC tablet, Take 81 mg by mouth daily.  , Disp: , Rfl:  Cholecalciferol (VITAMIN D) 1000 UNITS capsule, 1 once daily by mouth , Disp: , Rfl:  diltiazem (CARDIZEM CD) 120 MG 24 hr capsule, Take 1 capsule (120 mg total) by mouth daily as needed., Disp: 30 capsule, Rfl: 2 hydroxychloroquine (PLAQUENIL) 200 MG tablet, Take by mouth 2 (two) times daily.  , Disp: , Rfl:  levothyroxine (SYNTHROID, LEVOTHROID) 112 MCG tablet, Take 1 tablet (112 mcg total) by mouth daily., Disp: 90 tablet, Rfl: 3 Multiple Vitamin (MULTIVITAMINS PO), Take by mouth daily.  , Disp: , Rfl:  omeprazole (PRILOSEC) 40 MG capsule, Take 1 capsule (40 mg total) by mouth daily., Disp: 90 capsule, Rfl: 3 predniSONE (DELTASONE) 5 MG tablet, Take 5 mg by mouth daily., Disp: , Rfl:    No current  facility-administered medications on file prior to visit.   Past Medical History:   Systemic lupus erythematosus                                 Generalized hyperhidrosis                                    Dysthymic disorder                                           Abnormal involuntary movements(781.0)                        Depressive disorder, not elsewhere classified                Urticaria, unspecified                                       Unspecified disease of pericardium                             Comment:Pericarditis  Anemia of other chronic disease                              Diaphragmatic hernia without mention of obstru*              Other rheumatoid arthritis with visceral or sy*              Personal history of other disorders of nervous*              Unspecified hypothyroidism                                   GERD (gastroesophageal reflux disease)                       Meniere's disease of left ear                                H/O hiatal hernia                                            Migraine                                                       Comment:"used to have classic kind; not anymore; last               one was in 2012"   Lymphoma                                        03/2010         Comment:"they got it all w/surgery"   Palpitations                                                Past Surgical History:   CHOLECYSTECTOMY                                  1974         LUMBAR DISC SURGERY                              1977         shunt                                                           Comment:put in behind left ear ; Menier's syndrome   ESOPHAGOGASTRODUODENOSCOPY  01-24-11        Comment:Slight stenosis esoph spasm   APPENDECTOMY                                     1974         BREAST BIOPSY                                    03/2010         Comment:left breast;    BREAST LUMPECTOMY                                03/2010          Comment:left breast; nonHodgkins lymphoma; "not breast               cancer"   ABDOMINAL HYSTERECTOMY                           1978         Left VATS                                        12/1997       ESOPHAGEAL DILATION                              ?02/2011     Review of patient's family history indicates:   Heart failure                  Mother                   Stroke                         Father                   Coronary artery disease        Sister                   COPD                           Sister                   Coronary artery disease        Brother                  Diabetes                       Brother                  Heart failure                  Brother                  Coronary artery disease        Sister                   Coronary artery  disease        Sister                   Cancer                         Sister                   Heart failure                  Sister                   Cancer                         Sister                     Comment: lung, esoph   Cancer                         Sister                     Comment: lung   Colon cancer                   Neg Hx                   Social History   Marital Status: Widowed             Spouse Name:                      Years of Education:                 Number of children: 2           Occupational History Occupation          Associate Professor            Comment              Disabled                                  Social History Main Topics   Smoking Status: Never Smoker                     Smokeless Status: Never Used                       Alcohol Use: No             Drug Use: No             Sexual Activity: No                 Other Topics            Concern   None on file  Social History Narrative   Ms. Speranza lives in [place], [place]. She is widowed with 2 daughers - she lives with one of her daughters. 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      ROS: See history of present illness otherwise negative   PHYSICAL EXAM: Obese, in no acute distress. Neck: No JVD, HJR, Bruit, or thyroid enlargement  Lungs: No tachypnea, clear without wheezing, rales, or rhonchi  Cardiovascular: RRR, PMI not displaced, heart sounds normal, no murmurs, gallops, bruit, thrill, or heave.  Abdomen: BS normal. Soft without organomegaly, masses, lesions or tenderness.  Extremities: without cyanosis, clubbing or edema. Good distal pulses bilateral  SKin: Warm, no lesions or rashes   Musculoskeletal: No deformities  Neuro: no focal signs  BP 142/88  Pulse 79  Ht 5\' 5"  (1.651 m)  Wt 214 lb (97.07 kg)  BMI 35.61 kg/m2   EKG: Normal sinus rhythm no acute change

## 2013-07-20 NOTE — Assessment & Plan Note (Signed)
Palpitations are stable on Cardizem

## 2013-07-21 ENCOUNTER — Other Ambulatory Visit: Payer: Medicare Other

## 2013-07-22 ENCOUNTER — Telehealth: Payer: Self-pay | Admitting: Physician Assistant

## 2013-07-22 NOTE — Telephone Encounter (Signed)
In addition, I have messaged M. Geni Bers but she only works one day a week.

## 2013-07-22 NOTE — Telephone Encounter (Signed)
Pt scheduled for stress echo by Leda Gauze for 07-26-13.  Insurance denied.  Dr. Johney Frame, pt's MD and Leda Gauze are out of office until Wed, 07-27-13.  Ok to cancel stress echo until they return to decide what to do next?

## 2013-07-26 ENCOUNTER — Other Ambulatory Visit (HOSPITAL_COMMUNITY): Payer: Medicare Other

## 2013-07-28 ENCOUNTER — Ambulatory Visit (INDEPENDENT_AMBULATORY_CARE_PROVIDER_SITE_OTHER): Payer: Medicare Other | Admitting: Family Medicine

## 2013-07-28 ENCOUNTER — Encounter: Payer: Self-pay | Admitting: Family Medicine

## 2013-07-28 VITALS — BP 118/70 | HR 71 | Temp 97.8°F | Ht 65.0 in | Wt 213.8 lb

## 2013-07-28 DIAGNOSIS — E781 Pure hyperglyceridemia: Secondary | ICD-10-CM

## 2013-07-28 DIAGNOSIS — Z0000 Encounter for general adult medical examination without abnormal findings: Secondary | ICD-10-CM

## 2013-07-28 DIAGNOSIS — E039 Hypothyroidism, unspecified: Secondary | ICD-10-CM

## 2013-07-28 DIAGNOSIS — M329 Systemic lupus erythematosus, unspecified: Secondary | ICD-10-CM

## 2013-07-28 DIAGNOSIS — Z1211 Encounter for screening for malignant neoplasm of colon: Secondary | ICD-10-CM

## 2013-07-28 LAB — LIPID PANEL
HDL: 47.5 mg/dL (ref >39.00)
Total CHOL/HDL Ratio: 3
Triglycerides: 111 mg/dL (ref 0.0–149.0)
VLDL: 22.2 mg/dL (ref 0.0–40.0)

## 2013-07-28 MED ORDER — LEVOTHYROXINE SODIUM 112 MCG PO TABS: 112.0000 ug | ORAL_TABLET | Freq: Every day | ORAL | Status: DC

## 2013-07-28 NOTE — Patient Instructions (Addendum)
Go to the lab on the way out.  We'll contact you with your lab report.  Ask rheumatology about getting your next bone density test.  I would get a flu shot each fall.   I'll await your cardiology test results.  Keep working on [employer].   Take care.

## 2013-07-29 ENCOUNTER — Encounter: Payer: Self-pay | Admitting: *Deleted

## 2013-07-29 NOTE — Assessment & Plan Note (Signed)
tsh wnl, continue as is.   

## 2013-07-29 NOTE — Assessment & Plan Note (Signed)
Per rheum 

## 2013-07-29 NOTE — Assessment & Plan Note (Signed)
See scanned forms.  Routine anticipatory guidance given to patient.  See health maintenance. Flu encouraged Shingles not indicated with pred use PNA 2013 Tetanus 2011 D/w patient [id number] for colon cancer screening, including IFOB vs. colonoscopy.  Risks and benefits of both were discussed and patient voiced understanding.  Pt elects for: IFOB Breast cancer screening up to date DXA per rheum.  Pap not indicated, discussed Advance directive d/w pt.  Daughter Selena Batten would be designated if incapacitated.  Cognitive function addressed- see scanned forms- and if abnormal then additional documentation follows.  D/w pt about weight.  She is going to work on diet and exercise.  She is cutting out carbs.

## 2013-07-29 NOTE — Assessment & Plan Note (Signed)
With cards f/u testing pending.

## 2013-07-29 NOTE — Progress Notes (Signed)
I have personally reviewed the Medicare Annual Wellness questionnaire and have noted 1. The patient's medical and social history 2. Their use of alcohol, tobacco or illicit drugs 3. Their current medications and supplements 4. The patient's functional ability including ADL's, fall risks, home safety risks and hearing or visual             impairment. 5. Diet and physical activities 6. Evidence for depression or mood disorders  The patients weight, height, BMI have been recorded in the chart and visual acuity is per eye clinic.  I have made referrals, counseling and provided education to the patient based review of the above and I have provided the pt with a written personalized care plan for preventive services.  See scanned forms.  Routine anticipatory guidance given to patient.  See health maintenance. Flu encouraged Shingles not indicated with pred use PNA 2013 Tetanus 2011 D/w patient [id number] for colon cancer screening, including IFOB vs. colonoscopy.  Risks and benefits of both were discussed and patient voiced understanding.  Pt elects for: IFOB Breast cancer screening up to date DXA per rheum.  Pap not indicated, discussed Advance directive d/w pt.  Daughter Selena Batten would be designated if incapacitated.  Cognitive function addressed- see scanned forms- and if abnormal then additional documentation follows.  D/w pt about weight.  She is going to work on diet and exercise.  She is cutting out carbs.    Lupus/RA per rheum.  No acute worsening of joint sx.    She has had some episodic weak spells, had cards f/u with echo and treadmill pending.    Hypothyroid, TSH wnl, no neck mass and no dysphagia.  Labs d/w pt.   PMH and SH reviewed  Meds, vitals, and allergies reviewed.   ROS: See HPI.  Otherwise negative.    GEN: nad, alert and oriented HEENT: mucous membranes moist NECK: supple w/o LA CV: rrr. PULM: ctab, no inc wob ABD: soft, +bs EXT: no edema SKIN: no acute  rash Chronic IP joint changes noted

## 2013-08-02 ENCOUNTER — Other Ambulatory Visit (HOSPITAL_COMMUNITY): Payer: Self-pay | Admitting: Internal Medicine

## 2013-08-02 DIAGNOSIS — R0600 Dyspnea, unspecified: Secondary | ICD-10-CM

## 2013-08-04 ENCOUNTER — Ambulatory Visit (HOSPITAL_COMMUNITY): Payer: Medicare Other | Attending: Cardiology

## 2013-08-04 DIAGNOSIS — R002 Palpitations: Secondary | ICD-10-CM | POA: Insufficient documentation

## 2013-08-04 DIAGNOSIS — R0989 Other specified symptoms and signs involving the circulatory and respiratory systems: Secondary | ICD-10-CM | POA: Insufficient documentation

## 2013-08-04 DIAGNOSIS — I4949 Other premature depolarization: Secondary | ICD-10-CM | POA: Insufficient documentation

## 2013-08-04 DIAGNOSIS — R0602 Shortness of breath: Secondary | ICD-10-CM

## 2013-08-04 DIAGNOSIS — E669 Obesity, unspecified: Secondary | ICD-10-CM | POA: Insufficient documentation

## 2013-08-04 DIAGNOSIS — R0600 Dyspnea, unspecified: Secondary | ICD-10-CM

## 2013-08-04 DIAGNOSIS — R0609 Other forms of dyspnea: Secondary | ICD-10-CM | POA: Insufficient documentation

## 2013-08-04 NOTE — Progress Notes (Signed)
Echocardiogram performed.  

## 2013-08-22 ENCOUNTER — Telehealth: Payer: Self-pay | Admitting: *Deleted

## 2013-08-22 DIAGNOSIS — R0600 Dyspnea, unspecified: Secondary | ICD-10-CM

## 2013-08-22 NOTE — Telephone Encounter (Signed)
Stress ECHO denied by insurance. Provider now requesting Treadmill Stress Test for dyspnea.

## 2013-08-23 ENCOUNTER — Ambulatory Visit (INDEPENDENT_AMBULATORY_CARE_PROVIDER_SITE_OTHER): Payer: Medicare Other | Admitting: Physician Assistant

## 2013-08-23 DIAGNOSIS — R0609 Other forms of dyspnea: Secondary | ICD-10-CM

## 2013-08-23 DIAGNOSIS — R0600 Dyspnea, unspecified: Secondary | ICD-10-CM

## 2013-08-23 NOTE — Progress Notes (Signed)
Exercise Treadmill Test  Pre-Exercise Testing Evaluation Rhythm: normal sinus  Rate: 74     Test  Exercise Tolerance Test Ordering MD: Hillis Range, MD  Interpreting MD: Tereso Newcomer, PA-C  Unique Test No: 1  Treadmill:  1  Indication for ETT: exertional dyspnea  Contraindication to ETT: No   Stress Modality: exercise - treadmill  Cardiac Imaging Performed: non   Protocol: standard Bruce - maximal  Max BP:  210/83  Max MPHR (bpm):  152 85% MPR (bpm):  129  MPHR obtained (bpm):  130 % MPHR obtained:  86  Reached 85% MPHR (min:sec):  4:00 Total Exercise Time (min-sec):  4:07  Workload in METS:  5.9 Borg Scale: 17  Reason ETT Terminated:  fatigue    ST Segment Analysis At Rest: normal ST segments - no evidence of significant ST depression With Exercise: no evidence of significant ST depression  Other Information Arrhythmia:  No Angina during ETT:  absent (0) Quality of ETT:  diagnostic  ETT Interpretation:  normal - no evidence of ischemia by ST analysis  Comments: Fair exercise tolerance. No chest pain.  She did c/o dyspnea. Hypertensive BP response to exercise. No ST-T changes to suggest ischemia.  O2 sats remained 92-94% throughout exercise on RA.   Recommendations: F/u with Dr. Hillis Range as directed. Signed, Tereso Newcomer, PA-C   08/23/2013 9:41 AM

## 2013-09-08 ENCOUNTER — Other Ambulatory Visit: Payer: Self-pay | Admitting: Internal Medicine

## 2013-09-19 ENCOUNTER — Ambulatory Visit: Payer: Medicare Other | Admitting: Internal Medicine

## 2013-09-28 ENCOUNTER — Ambulatory Visit: Payer: Medicare Other | Admitting: Internal Medicine

## 2013-10-11 ENCOUNTER — Ambulatory Visit: Payer: Medicare Other

## 2013-10-18 ENCOUNTER — Ambulatory Visit (INDEPENDENT_AMBULATORY_CARE_PROVIDER_SITE_OTHER): Payer: Medicare Other

## 2013-10-18 DIAGNOSIS — Z23 Encounter for immunization: Secondary | ICD-10-CM

## 2013-10-24 ENCOUNTER — Other Ambulatory Visit: Payer: Self-pay | Admitting: Family Medicine

## 2013-10-27 ENCOUNTER — Other Ambulatory Visit: Payer: Self-pay | Admitting: Family Medicine

## 2013-12-07 ENCOUNTER — Other Ambulatory Visit: Payer: Self-pay | Admitting: Internal Medicine

## 2014-01-12 ENCOUNTER — Encounter: Payer: Self-pay | Admitting: Internal Medicine

## 2014-01-12 ENCOUNTER — Ambulatory Visit (INDEPENDENT_AMBULATORY_CARE_PROVIDER_SITE_OTHER): Payer: Medicare Other | Admitting: Internal Medicine

## 2014-01-12 ENCOUNTER — Encounter (INDEPENDENT_AMBULATORY_CARE_PROVIDER_SITE_OTHER): Payer: Medicare Other | Admitting: Internal Medicine

## 2014-01-12 VITALS — BP 153/87 | HR 75 | Ht 65.0 in | Wt 211.0 lb

## 2014-01-12 DIAGNOSIS — R0989 Other specified symptoms and signs involving the circulatory and respiratory systems: Secondary | ICD-10-CM

## 2014-01-12 DIAGNOSIS — R0609 Other forms of dyspnea: Secondary | ICD-10-CM

## 2014-01-12 DIAGNOSIS — R002 Palpitations: Secondary | ICD-10-CM

## 2014-01-12 DIAGNOSIS — E669 Obesity, unspecified: Secondary | ICD-10-CM

## 2014-01-12 DIAGNOSIS — R0602 Shortness of breath: Secondary | ICD-10-CM

## 2014-01-12 MED ORDER — DILTIAZEM HCL ER COATED BEADS 120 MG PO CP24: ORAL_CAPSULE | ORAL | Status: DC

## 2014-01-12 NOTE — Progress Notes (Signed)
This encounter was created in error - please disregard.

## 2014-01-12 NOTE — Patient Instructions (Addendum)
Your physician recommends that you schedule a follow-up appointment in: 6 weeks with Cyndi Bender and 12 months with Dr Rayann Heman  Your physician has recommended that you have a pulmonary function test. Pulmonary Function Tests are a group of tests that measure how well air moves in and out of your lungs.  Your physician has recommended that you have a cardiopulmonary stress test (CPX). CPX testing is a non-invasive measurement of heart and lung function. It replaces a traditional treadmill stress test. This type of test provides a tremendous amount of information that relates not only to your present condition but also for future outcomes. This test combines measurements of you ventilation, respiratory gas exchange in the lungs, electrocardiogram (EKG), blood pressure and physical response before, during, and following an exercise protocol.

## 2014-01-12 NOTE — Patient Instructions (Signed)
Your physician recommends that you schedule a follow-up appointment in: 6 weeks with Scoot Weaver,PA and 12 months with Dr Rayann Heman  Your physician has recommended that you have a pulmonary function test. Pulmonary Function Tests are a group of tests that measure how well air moves in and out of your lungs.  Your physician has recommended that you have a cardiopulmonary stress test (CPX). CPX testing is a non-invasive measurement of heart and lung function. It replaces a traditional treadmill stress test. This type of test provides a tremendous amount of information that relates not only to your present condition but also for future outcomes. This test combines measurements of you ventilation, respiratory gas exchange in the lungs, electrocardiogram (EKG), blood pressure and physical response before, during, and following an exercise protocol.

## 2014-01-12 NOTE — Progress Notes (Signed)
PCP: Elsie Stain, MD  Hannah Pitts is a 68 y.o. female who presents today for routine electrophysiology followup.  Since last being seen in our clinic, the patient reports doing very well.  Her palpitations are stable.  Her primary concern with with SOB.  This persists.  She has made no progress with lifestyle modification/ weight reduction.   Today, she denies symptoms of chest pain, lower extremity edema, dizziness, presyncope, or syncope. The patient is otherwise without complaint today.   Past Medical History  Diagnosis Date  . Systemic lupus erythematosus   . Generalized hyperhidrosis   . Dysthymic disorder   . Abnormal involuntary movements(781.0)   . Depressive disorder, not elsewhere classified   . Urticaria, unspecified   . Unspecified disease of pericardium     Pericarditis   . Anemia of other chronic disease   . Diaphragmatic hernia without mention of obstruction or gangrene   . Other rheumatoid arthritis with visceral or systemic involvement   . Personal history of other disorders of nervous system and sense organs   . Unspecified hypothyroidism   . GERD (gastroesophageal reflux disease)   . Meniere's disease of left ear   . H/O hiatal hernia   . Migraine     "used to have classic kind; not anymore; last one was in 2012"  . Lymphoma 03/2010    "they got it all w/surgery"  . Palpitations    Past Surgical History  Procedure Laterality Date  . Cholecystectomy  1974  . Lumbar disc surgery  1977  . Shunt      put in behind left ear ; Menier's syndrome  . Esophagogastroduodenoscopy  01-24-11    Slight stenosis esoph spasm  . Appendectomy  1974  . Breast biopsy  03/2010    left breast;   . Breast lumpectomy  03/2010    left breast; nonHodgkins lymphoma; "not breast cancer"  . Abdominal hysterectomy  1978  . Left vats  12/1997  . Esophageal dilation  ?02/2011    Current Outpatient Prescriptions  Medication Sig Dispense Refill  . aspirin (ASPIRIN LOW DOSE) 81 MG EC  tablet Take 81 mg by mouth daily.        . Cholecalciferol (VITAMIN D) 1000 UNITS capsule 1 once daily by mouth       . cycloSPORINE (RESTASIS) 0.05 % ophthalmic emulsion Place 1 drop into both eyes 2 (two) times daily.      Marland Kitchen diltiazem (CARDIZEM CD) 120 MG 24 hr capsule TAKE ONE CAPSULE EVERY DAY AS NEEDED  30 capsule  11  . hydroxychloroquine (PLAQUENIL) 200 MG tablet Take by mouth 2 (two) times daily.        Marland Kitchen levothyroxine (SYNTHROID, LEVOTHROID) 112 MCG tablet TAKE 1 TABLET BY MOUTH EVERY DAY  90 tablet  1  . Multiple Vitamin (MULTIVITAMINS PO) Take by mouth daily.        Marland Kitchen omeprazole (PRILOSEC) 40 MG capsule TAKE ONE CAPSULE BY MOUTH EVERY DAY  90 capsule  3  . predniSONE (DELTASONE) 5 MG tablet Take 5 mg by mouth daily.       No current facility-administered medications for this visit.    Physical Exam: Filed Vitals:   01/12/14 1653  BP: 153/87  Pulse: 75  Height: 5\' 5"  (1.651 m)  Weight: 211 lb (95.709 kg)    GEN- The patient is well appearing, alert and oriented x 3 today.   Head- normocephalic, atraumatic Eyes-  Sclera clear, conjunctiva pink Ears- hearing intact Oropharynx- clear  Lungs- Clear to ausculation bilaterally, normal work of breathing Heart- Regular rate and rhythm, no murmurs, rubs or gallops, PMI not laterally displaced GI- soft, NT, ND, + BS Extremities- no clubbing, cyanosis, or edema  ekg today reveals sinus rhythm, incomplete RBBBB  Assessment and Plan:  Palpitations  The patient has longstanding palpitations. Prior holter reveals PACs/ PVCs. Well controlled with diltiazem No further workup is therefore planned.  HYPERTENSION, BENIGN ESSENTIAL   Weight reduction is strongly advised  SHORTNESS OF BREATH   Echo and gxt are reviewed I will order pfts with spirometry and cpx to better evaluate.  Weight loss is strongly advised She will follow-up with Richardson Dopp in 6 weeks.   I will see again in 12 months

## 2014-03-08 ENCOUNTER — Encounter: Payer: Self-pay | Admitting: Family Medicine

## 2014-03-08 ENCOUNTER — Ambulatory Visit (INDEPENDENT_AMBULATORY_CARE_PROVIDER_SITE_OTHER): Payer: Medicare Other | Admitting: Family Medicine

## 2014-03-08 VITALS — BP 144/86 | HR 68 | Temp 98.0°F | Wt 221.5 lb

## 2014-03-08 DIAGNOSIS — W57XXXA Bitten or stung by nonvenomous insect and other nonvenomous arthropods, initial encounter: Secondary | ICD-10-CM

## 2014-03-08 DIAGNOSIS — H6980 Other specified disorders of Eustachian tube, unspecified ear: Secondary | ICD-10-CM

## 2014-03-08 DIAGNOSIS — T148 Other injury of unspecified body region: Secondary | ICD-10-CM

## 2014-03-08 NOTE — Assessment & Plan Note (Signed)
Her hearing was immediately better after valsalva in the office and the improvement lasted the rest of the time she was in clinic.  Would try valsalva a few times a day with nasal saline also.  D/w pt re: anatomy.  F/u prn.  She agrees.

## 2014-03-08 NOTE — Patient Instructions (Addendum)
Please call cardiology about follow up and testing.   Use nasal saline a few times a day and gently try to pop your ears 1-2 times a day.  If this keeps getting better, then we don't need to do anything else.  The tick bite site looks okay.  If you have fever, rash or new joint aches then notify me.  Take care.

## 2014-03-08 NOTE — Progress Notes (Signed)
Pre visit review using our clinic review tool, if applicable. No additional management support is needed unless otherwise documented below in the visit note.  She hasn't had pending cards eval yet, we discussed.  She didn't know if she could do the treadmill component and I asked her to talk to cards.  She is okay at rest but has little exercise tolerance.  This is at baseline over the last year.  She'll call cards to discuss.    R ear trouble.  H/o Menire's in the L ear.  Loss of hearing in the L ear. 2 months ago she had pain in R ear and then had noise sensitivity in the R ear.  R ear is still much more sensitive.  She has no loss of hearing.  No ear pain, but ambient noise is more irritating than usual.  She isn't dizzy as with the prev Menire's flare.  She hears something "breathing" in her ear at night.  It goes away when she holds her breath.    She had tick bite on the L forearm, briefly attached, removed completely with the head intact.  She brought it in today.  Tick was still alive initially.  I killed it at the [facility] and disposed of it.   Meds, vitals, and allergies reviewed.   ROS: See HPI.  Otherwise, noncontributory.  nad ncat No hearing L ear Air>bone conduction R ear.  Weber localized to the R ear.  TMs wnl B, but ETD noted- R TM retracted, able to move slowly with valsalva.  Op wnl Neck supple, no LA L forearm with small bruise (<1cm) at the bite site, clean and intact o/w w/o erythema.  No retained FB seen under magnification.

## 2014-03-08 NOTE — Assessment & Plan Note (Signed)
Briefly attached, skin looks good o/w.  She'll notify us if she has sx.  No need to treat o/w now.

## 2014-04-03 ENCOUNTER — Ambulatory Visit (INDEPENDENT_AMBULATORY_CARE_PROVIDER_SITE_OTHER): Payer: Medicare Other | Admitting: Internal Medicine

## 2014-04-03 ENCOUNTER — Ambulatory Visit (INDEPENDENT_AMBULATORY_CARE_PROVIDER_SITE_OTHER)
Admission: RE | Admit: 2014-04-03 | Discharge: 2014-04-03 | Disposition: A | Discharge status: Home / Self Care | Payer: Medicare Other | Source: Ambulatory Visit | Attending: Internal Medicine | Admitting: Internal Medicine

## 2014-04-03 ENCOUNTER — Encounter: Payer: Self-pay | Admitting: Internal Medicine

## 2014-04-03 VITALS — BP 120/70 | HR 93 | Resp 24 | Wt 217.0 lb

## 2014-04-03 DIAGNOSIS — R05 Cough: Secondary | ICD-10-CM

## 2014-04-03 DIAGNOSIS — R059 Cough, unspecified: Secondary | ICD-10-CM

## 2014-04-03 MED ORDER — AMOXICILLIN-POT CLAVULANATE 875-125 MG PO TABS: 1.0000 | ORAL_TABLET | Freq: Two times a day (BID) | ORAL | Status: DC

## 2014-04-03 MED ORDER — PREDNISONE 20 MG PO TABS: 40.0000 mg | ORAL_TABLET | Freq: Every day | ORAL | Status: DC

## 2014-04-03 NOTE — Progress Notes (Signed)
Subjective:    Patient ID: Hannah Pitts, female    DOB: 01/15/1946, 68 y.o.   MRN: [id number]  HPI Got over ear problems  Started with cold about 5 days ago Feels it is stuck in chest Coughs but doesn't clear out ?low grade fever---had chills 2 days ago Some SOB---especially with exertion (worse than her baseline)  Has nasal drainage Some headaches Left ear pain Left sore throat also  mucinex DM-- ?slight help Tried inhaler yesterday---may have helped some. Had been wheezing then  Current Outpatient Prescriptions on File Prior to Visit  Medication Sig Dispense Refill  . aspirin (ASPIRIN LOW DOSE) 81 MG EC tablet Take 81 mg by mouth daily.        . Cholecalciferol (VITAMIN D) 1000 UNITS capsule 1 once daily by mouth       . cycloSPORINE (RESTASIS) 0.05 % ophthalmic emulsion Place 1 drop into both eyes 2 (two) times daily.      Marland Kitchen diltiazem (CARDIZEM CD) 120 MG 24 hr capsule TAKE ONE CAPSULE EVERY DAY AS NEEDED  30 capsule  11  . hydroxychloroquine (PLAQUENIL) 200 MG tablet Take by mouth 2 (two) times daily.        Marland Kitchen levothyroxine (SYNTHROID, LEVOTHROID) 112 MCG tablet TAKE 1 TABLET BY MOUTH EVERY DAY  90 tablet  1  . Multiple Vitamin (MULTIVITAMINS PO) Take by mouth daily.        Marland Kitchen omeprazole (PRILOSEC) 40 MG capsule TAKE ONE CAPSULE BY MOUTH EVERY DAY  90 capsule  3  . Polyethyl Glycol-Propyl Glycol (SYSTANE OP) Apply to eye as needed.      . predniSONE (DELTASONE) 5 MG tablet Take 5 mg by mouth daily.       No current facility-administered medications on file prior to visit.    No Known Allergies  Past Medical History  Diagnosis Date  . Systemic lupus erythematosus   . Generalized hyperhidrosis   . Dysthymic disorder   . Abnormal involuntary movements(781.0)   . Depressive disorder, not elsewhere classified   . Urticaria, unspecified   . Unspecified disease of pericardium     Pericarditis   . Anemia of other chronic disease   . Diaphragmatic hernia without  mention of obstruction or gangrene   . Other rheumatoid arthritis with visceral or systemic involvement   . Personal history of other disorders of nervous system and sense organs   . Unspecified hypothyroidism   . GERD (gastroesophageal reflux disease)   . Meniere's disease of left ear   . H/O hiatal hernia   . Migraine     "used to have classic kind; not anymore; last one was in 2012"  . Lymphoma 03/2010    "they got it all w/surgery"  . Palpitations     Past Surgical History  Procedure Laterality Date  . Cholecystectomy  1974  . Lumbar disc surgery  1977  . Shunt      put in behind left ear ; Menier's syndrome  . Esophagogastroduodenoscopy  01-24-11    Slight stenosis esoph spasm  . Appendectomy  1974  . Breast biopsy  03/2010    left breast;   . Breast lumpectomy  03/2010    left breast; nonHodgkins lymphoma; "not breast cancer"  . Abdominal hysterectomy  1978  . Left vats  12/1997  . Esophageal dilation  ?02/2011    Family History  Problem Relation Age of Onset  . Heart failure Mother   . Stroke Father   . Coronary artery  disease Sister   . COPD Sister   . Coronary artery disease Brother   . Diabetes Brother   . Heart failure Brother   . Coronary artery disease Sister   . Coronary artery disease Sister   . Cancer Sister   . Heart failure Sister   . Cancer Sister     lung, esoph  . Cancer Sister     lung  . Colon cancer Neg Hx     History   Social History  . Marital Status: Widowed    Spouse Name: N/A    Number of Children: 2  . Years of Education: N/A   Occupational History  . Disabled     Social History Main Topics  . Smoking status: Never Smoker   . Smokeless tobacco: Never Used  . Alcohol Use: No  . Drug Use: No  . Sexual Activity: No   Other Topics Concern  . Not on file   Social History Narrative   Hannah Pitts lives in [place], [place]. She is widowed with 2 daughers - she lives with one of her daughters. She worked as a housewife and later  in Psychologist, educational on an Designer, television/film set.    Review of Systems No rash No vomiting or diarrhea Appetite is off     Objective:   Physical Exam  Constitutional: She appears well-developed and well-nourished. No distress.  HENT:  Mouth/Throat: Oropharynx is clear and moist. No oropharyngeal exudate.  No sinus tenderness Slight nasal inflammation TMs normal  Neck: Normal range of motion. Neck supple. No thyromegaly present.  Pulmonary/Chest: Effort normal. No respiratory distress. She has no wheezes. She has no rales.  Slightly decreased breath sounds but clear  Lymphadenopathy:    She has no cervical adenopathy.          Assessment & Plan:

## 2014-04-03 NOTE — Assessment & Plan Note (Signed)
And dyspnea which is worse Severe obstruction on spirometry but poor effort so may overstate her lung capacity CXR shows elevated right hemidiaphragm---this is old. i don't see any infiltrate Will treat with antibiotics since she may have bacterial infection and is on prednisone Will give burst of prednisone for now ?could this be part of her DOE Will route to PCP also

## 2014-04-03 NOTE — Progress Notes (Signed)
Pre visit review using our clinic review tool, if applicable. No additional management support is needed unless otherwise documented below in the visit note. 

## 2014-04-05 ENCOUNTER — Telehealth: Payer: Self-pay | Admitting: Family Medicine

## 2014-04-05 NOTE — Telephone Encounter (Signed)
Message copied by Tonia Ghent on Wed Apr 05, 2014  9:57 PM ------      Message from: LAWS, REGINA C      Created: Wed Apr 05, 2014  3:32 PM       Spoke to patient and was advised that she is getting a little better each day. Patient stated that if she gets worse she will call back.      ----- Message -----         From: Emelia Salisbury, LPN         Sent: 02/28/8249   2:36 PM           To: Josetta Huddle, CMA            Left message on answering machine for patient to call back.      ----- Message -----         From: Tonia Ghent, MD         Sent: 04/05/2014           To: Emelia Salisbury, LPN            Please call pt wed PM and get an update.  I would expect slow progress.  As long as not worsening, then continue as is.  Thanks.       ----- Message -----         From: Josetta Huddle, CMA         Sent: 04/04/2014  11:23 AM           To: Tonia Ghent, MD            Patient says she is still having the coughing spells and she is trying not to panic which she thinks is helping some.  She has been able to cough up some sputum and it is clear.  She thinks she may be a little bit better.  You may want to ask Rollene Fare to call her again tomorrow in my absence.      ----- Message -----         From: Tonia Ghent, MD         Sent: 04/04/2014           To: Josetta Huddle, CMA            Please call and get update on patient either on 4/7 or 4/8.  Thanks.                          ------

## 2014-04-05 NOTE — Telephone Encounter (Signed)
Noted. Thanks.

## 2014-04-24 ENCOUNTER — Other Ambulatory Visit: Payer: Self-pay | Admitting: Family Medicine

## 2014-07-18 ENCOUNTER — Other Ambulatory Visit: Payer: Self-pay | Admitting: Family Medicine

## 2014-07-18 DIAGNOSIS — D638 Anemia in other chronic diseases classified elsewhere: Secondary | ICD-10-CM

## 2014-07-18 DIAGNOSIS — IMO0002 Reserved for concepts with insufficient information to code with codable children: Secondary | ICD-10-CM

## 2014-07-18 DIAGNOSIS — E039 Hypothyroidism, unspecified: Secondary | ICD-10-CM

## 2014-07-24 ENCOUNTER — Other Ambulatory Visit (INDEPENDENT_AMBULATORY_CARE_PROVIDER_SITE_OTHER): Payer: Medicare Other

## 2014-07-24 DIAGNOSIS — E039 Hypothyroidism, unspecified: Secondary | ICD-10-CM

## 2014-07-24 DIAGNOSIS — R5383 Other fatigue: Secondary | ICD-10-CM

## 2014-07-24 DIAGNOSIS — IMO0002 Reserved for concepts with insufficient information to code with codable children: Secondary | ICD-10-CM

## 2014-07-24 DIAGNOSIS — R5381 Other malaise: Secondary | ICD-10-CM

## 2014-07-24 DIAGNOSIS — D638 Anemia in other chronic diseases classified elsewhere: Secondary | ICD-10-CM

## 2014-07-24 LAB — COMPREHENSIVE METABOLIC PANEL
ALBUMIN: 3.8 g/dL (ref 3.5–5.2)
ALT: 22 U/L (ref 0–35)
AST: 29 U/L (ref 0–37)
Alkaline Phosphatase: 70 U/L (ref 39–117)
BUN: 28 mg/dL — ABNORMAL HIGH (ref 6–23)
CALCIUM: 9.2 mg/dL (ref 8.4–10.5)
CHLORIDE: 110 meq/L (ref 96–112)
CO2: 28 meq/L (ref 19–32)
Creatinine, Ser: 1.2 mg/dL (ref 0.4–1.2)
GFR: 47.74 mL/min — ABNORMAL LOW (ref >60.00)
Glucose, Bld: 80 mg/dL (ref 70–99)
Potassium: 4.7 mEq/L (ref 3.5–5.1)
SODIUM: 143 meq/L (ref 135–145)
TOTAL PROTEIN: 6.3 g/dL (ref 6.0–8.3)
Total Bilirubin: 0.6 mg/dL (ref 0.2–1.2)

## 2014-07-24 LAB — LIPID PANEL
CHOLESTEROL: 171 mg/dL (ref 0–200)
HDL: 47.4 mg/dL (ref >39.00)
LDL CALC: 94 mg/dL (ref 0–99)
NonHDL: 123.6
Total CHOL/HDL Ratio: 4
Triglycerides: 148 mg/dL (ref 0.0–149.0)
VLDL: 29.6 mg/dL (ref 0.0–40.0)

## 2014-07-24 LAB — CBC WITH DIFFERENTIAL/PLATELET
BASOS ABS: 0 10*3/uL (ref 0.0–0.1)
Basophils Relative: 0.2 % (ref 0.0–3.0)
Eosinophils Absolute: 0.1 10*3/uL (ref 0.0–0.7)
Eosinophils Relative: 2.4 % (ref 0.0–5.0)
HCT: 38.5 % (ref 36.0–46.0)
Hemoglobin: 12.7 g/dL (ref 12.0–15.0)
LYMPHS PCT: 40.7 % (ref 12.0–46.0)
Lymphs Abs: 1.4 10*3/uL (ref 0.7–4.0)
MCHC: 33 g/dL (ref 30.0–36.0)
MCV: 87.7 fl (ref 78.0–100.0)
MONOS PCT: 10.4 % (ref 3.0–12.0)
Monocytes Absolute: 0.4 10*3/uL (ref 0.1–1.0)
NEUTROS PCT: 46.3 % (ref 43.0–77.0)
Neutro Abs: 1.6 10*3/uL (ref 1.4–7.7)
Platelets: 144 10*3/uL — ABNORMAL LOW (ref 150.0–400.0)
RBC: 4.4 Mil/uL (ref 3.87–5.11)
RDW: 13.8 % (ref 11.5–15.5)
WBC: 3.5 10*3/uL — ABNORMAL LOW (ref 4.0–10.5)

## 2014-07-24 LAB — TSH: TSH: 0.43 u[IU]/mL (ref 0.35–4.50)

## 2014-07-31 ENCOUNTER — Encounter: Payer: Self-pay | Admitting: Family Medicine

## 2014-07-31 ENCOUNTER — Ambulatory Visit (INDEPENDENT_AMBULATORY_CARE_PROVIDER_SITE_OTHER): Payer: Medicare Other | Admitting: Family Medicine

## 2014-07-31 VITALS — BP 140/84 | HR 70 | Temp 98.0°F | Ht 64.5 in | Wt 217.8 lb

## 2014-07-31 DIAGNOSIS — Z0000 Encounter for general adult medical examination without abnormal findings: Secondary | ICD-10-CM

## 2014-07-31 DIAGNOSIS — R269 Unspecified abnormalities of gait and mobility: Secondary | ICD-10-CM

## 2014-07-31 DIAGNOSIS — E039 Hypothyroidism, unspecified: Secondary | ICD-10-CM

## 2014-07-31 DIAGNOSIS — Z7189 Other specified counseling: Secondary | ICD-10-CM

## 2014-07-31 DIAGNOSIS — Z1211 Encounter for screening for malignant neoplasm of colon: Secondary | ICD-10-CM

## 2014-07-31 DIAGNOSIS — K449 Diaphragmatic hernia without obstruction or gangrene: Secondary | ICD-10-CM

## 2014-07-31 MED ORDER — OMEPRAZOLE 40 MG PO CPDR: DELAYED_RELEASE_CAPSULE | ORAL | Status: DC

## 2014-07-31 MED ORDER — LEVOTHYROXINE SODIUM 112 MCG PO TABS: ORAL_TABLET | ORAL | Status: DC

## 2014-07-31 NOTE — Assessment & Plan Note (Signed)
Flu prev done Shingles NA due to current meds PNA 2013 Tetanus 2011 D/w patient [id number] for colon cancer screening, including IFOB vs. colonoscopy.  Risks and benefits of both were discussed and patient voiced understanding.  Pt elects [id number] Breast cancer screening d/w pt.  She'll consider this and DXA.   Advance directive- daughter Joya Salm if patient were incapacitated.  Cognitive function addressed- see scanned forms- and if abnormal then additional documentation follows.

## 2014-07-31 NOTE — Patient Instructions (Addendum)
Go to the lab on the way out.  We'll contact you with your lab report. Rosaria Ferries will call about your referral. I would get a flu shot each fall.   Take care. Glad to see you.

## 2014-07-31 NOTE — Progress Notes (Signed)
Pre visit review using our clinic review tool, if applicable. No additional management support is needed unless otherwise documented below in the visit note.  I have personally reviewed the Medicare Annual Wellness questionnaire and have noted 1. The patient's medical and social history 2. Their use of alcohol, tobacco or illicit drugs 3. Their current medications and supplements 4. The patient's functional ability including ADL's, fall risks, home safety risks and hearing or visual             impairment. 5. Diet and physical activities 6. Evidence for depression or mood disorders  The patients weight, height, BMI have been recorded in the chart and visual acuity is per eye clinic.  I have made referrals, counseling and provided education to the patient based review of the above and I have provided the pt with a written personalized care plan for preventive services.  Provider list updated- see scanned forms.  Routine anticipatory guidance given to patient.  See health maintenance.  Flu prev done Shingles NA due to current meds PNA 2013 Tetanus 2011 D/w patient [id number] for colon cancer screening, including IFOB vs. colonoscopy.  Risks and benefits of both were discussed and patient voiced understanding.  Pt elects [id number] Breast cancer screening d/w pt.  She'll consider this and DXA.   Advance directive- daughter Joya Salm if patient were incapacitated.  Cognitive function addressed- see scanned forms- and if abnormal then additional documentation follows.   H/o lupus per rheum.  She has f/u this fall, pending.  Hypothyroidism.  TSH wnl, compliant with meds. No ADE, no neck mass.  No dysphagia.   Gait changes.  No falls. Going on for years.  H/o meniere's.  No new vertigo sx, no weakness. Worse in the last year. Will feel off balanced, but not consistently to one side of the other.  D/w pt.  Can still feel the floor, this doesn't appear to be a deficit of sensation. No focal  weakness per patient report.  PMH and SH reviewed  Meds, vitals, and allergies reviewed.   ROS: See HPI.  Otherwise negative.    GEN: nad, alert and oriented HEENT: mucous membranes moist NECK: supple w/o LA, no TMG CV: rrr. PULM: ctab, no inc wob ABD: soft, +bs EXT: no edema SKIN: no acute rash Gait unbalanced slightly, to B sides episodically.  CN 2-12 wnl B, S/S/DTR wnl x4 except for hard of hearing at baseline.

## 2014-07-31 NOTE — Assessment & Plan Note (Signed)
TSH wnl, continue as is with current dose.  All labs d/w pt at OV.  She agrees.

## 2014-07-31 NOTE — Assessment & Plan Note (Signed)
D/w pt. On PPI, would continue given the pred use.  She agrees.  She'll consider DXA.

## 2014-07-31 NOTE — Assessment & Plan Note (Signed)
No clear source, refer to PT for fall risk reduction.  She agrees.

## 2014-10-10 ENCOUNTER — Ambulatory Visit (INDEPENDENT_AMBULATORY_CARE_PROVIDER_SITE_OTHER): Payer: Medicare Other | Admitting: Family Medicine

## 2014-10-10 ENCOUNTER — Encounter: Payer: Self-pay | Admitting: Family Medicine

## 2014-10-10 VITALS — BP 140/90 | HR 66 | Temp 98.2°F | Resp 14 | Wt 210.5 lb

## 2014-10-10 DIAGNOSIS — M545 Low back pain, unspecified: Secondary | ICD-10-CM

## 2014-10-10 NOTE — Patient Instructions (Signed)
Try taking 2 regular tylenol up to 3 times a day and use a heating pad.  If getting worse tomorrow, then let me know.  You can try alternating ice and heat.  Take care.

## 2014-10-10 NOTE — Progress Notes (Signed)
Exercising last week.  Since then with back pain, started the next day after the exercise. Laying down and walking are okay, more pain sitting or trying to get up.  No fevers, vomiting.  No trauma.  No specific injury.  No leg pain.  No B/B sx.  No rash or bruising on the back.  Horizontal band of pain at he belt line, just below the belt.  Tried ibuprofen; it helped a little, didn't take it in the last 3 days.  On prednisone at baseline.  Hasn't tried ice or heat yet.    Pain is better as the day goes on.    Meds, vitals, and allergies reviewed.   ROS: See HPI.  Otherwise, noncontributory.  nad ncat rrr ctab SLR neg B.  B lower back ttp w/o rash but not in the midline Pain with forward flexion more than back extension.  gait at baseline once she gets out of a chair and takes a few steps.

## 2014-10-11 DIAGNOSIS — M545 Low back pain, unspecified: Secondary | ICD-10-CM | POA: Insufficient documentation

## 2014-10-11 NOTE — Assessment & Plan Note (Signed)
Benign exam.  Will try taking 2 regular tylenol up to 3 times a day and use a heating pad. If getting worse tomorrow, then she'll let me know.  Can try alternating ice and heat. F/u prn.

## 2014-10-23 ENCOUNTER — Other Ambulatory Visit: Payer: Self-pay | Admitting: Family Medicine

## 2014-12-12 ENCOUNTER — Ambulatory Visit (INDEPENDENT_AMBULATORY_CARE_PROVIDER_SITE_OTHER): Payer: Medicare Other | Admitting: Family Medicine

## 2014-12-12 ENCOUNTER — Encounter: Payer: Self-pay | Admitting: Family Medicine

## 2014-12-12 VITALS — BP 132/90 | HR 73 | Temp 98.7°F | Wt 208.0 lb

## 2014-12-12 DIAGNOSIS — H9190 Unspecified hearing loss, unspecified ear: Secondary | ICD-10-CM

## 2014-12-12 DIAGNOSIS — J440 Chronic obstructive pulmonary disease with acute lower respiratory infection: Secondary | ICD-10-CM

## 2014-12-12 DIAGNOSIS — J209 Acute bronchitis, unspecified: Secondary | ICD-10-CM | POA: Insufficient documentation

## 2014-12-12 MED ORDER — AZITHROMYCIN 250 MG PO TABS: ORAL_TABLET | ORAL | Status: DC

## 2014-12-12 MED ORDER — ALBUTEROL SULFATE HFA 108 (90 BASE) MCG/ACT IN AERS: 2.0000 | INHALATION_SPRAY | Freq: Four times a day (QID) | RESPIRATORY_TRACT | Status: DC | PRN

## 2014-12-12 NOTE — Assessment & Plan Note (Signed)
Refer

## 2014-12-12 NOTE — Progress Notes (Signed)
Pre visit review using our clinic review tool, if applicable. No additional management support is needed unless otherwise documented below in the visit note.  Dry cough for about 6 days, then some sputum production.  Not improved.  Still with sputum production now, still clear.  Still on pred and plaquenil, at baseline. Prev with fevers, resolved now.   Stuffy recently.  No ST.   Some wheeze.  Had run out of her inhaler.   No myalgias, "not more than usual for me."  No ear pain but L ear was itching.  Voice is slightly hoarse.   No SOB.   She has noted longstanding hearing loss and wanted referral.  D/w pt.   Meds, vitals, and allergies reviewed.   ROS: See HPI.  Otherwise, noncontributory.  GEN: nad, alert and oriented HEENT: mucous membranes moist, tm w/o erythema, nasal exam w/o erythema, clear discharge noted,  OP with cobblestoning NECK: supple w/o LA CV: rrr.   PULM: no inc wob, cough noted,  Diffuse B rhonchi and scant B wheezes w/o rales.  Speaking in complete sentences.  EXT: no edema SKIN: no acute rash

## 2014-12-12 NOTE — Patient Instructions (Signed)
Start zithromax today and use the inhaler if needed.   You should gradually get better.  If not, then notify me.  Take care.  Glad to see you.

## 2014-12-12 NOTE — Assessment & Plan Note (Signed)
Nontoxic, but symptomatic, with lung exam noted.  Still okay for outpatient f/u.  Start zmax and use inhaler prn.  She agrees.  Update [place] prn.

## 2015-01-16 DIAGNOSIS — H918X3 Other specified hearing loss, bilateral: Secondary | ICD-10-CM | POA: Diagnosis not present

## 2015-01-16 DIAGNOSIS — H9111 Presbycusis, right ear: Secondary | ICD-10-CM | POA: Diagnosis not present

## 2015-01-16 DIAGNOSIS — H833X1 Noise effects on right inner ear: Secondary | ICD-10-CM | POA: Diagnosis not present

## 2015-01-16 DIAGNOSIS — H8102 Meniere's disease, left ear: Secondary | ICD-10-CM | POA: Diagnosis not present

## 2015-01-29 DIAGNOSIS — M329 Systemic lupus erythematosus, unspecified: Secondary | ICD-10-CM | POA: Diagnosis not present

## 2015-03-29 DIAGNOSIS — H2513 Age-related nuclear cataract, bilateral: Secondary | ICD-10-CM | POA: Diagnosis not present

## 2015-03-29 DIAGNOSIS — M3210 Systemic lupus erythematosus, organ or system involvement unspecified: Secondary | ICD-10-CM | POA: Diagnosis not present

## 2015-03-29 DIAGNOSIS — Z79899 Other long term (current) drug therapy: Secondary | ICD-10-CM | POA: Diagnosis not present

## 2015-05-25 ENCOUNTER — Ambulatory Visit (INDEPENDENT_AMBULATORY_CARE_PROVIDER_SITE_OTHER): Payer: Medicare Other | Admitting: Family Medicine

## 2015-05-25 ENCOUNTER — Encounter: Payer: Self-pay | Admitting: Family Medicine

## 2015-05-25 VITALS — BP 136/78 | HR 65 | Temp 98.3°F | Wt 209.0 lb

## 2015-05-25 DIAGNOSIS — M7060 Trochanteric bursitis, unspecified hip: Secondary | ICD-10-CM | POA: Diagnosis not present

## 2015-05-25 DIAGNOSIS — E038 Other specified hypothyroidism: Secondary | ICD-10-CM | POA: Diagnosis not present

## 2015-05-25 DIAGNOSIS — R252 Cramp and spasm: Secondary | ICD-10-CM | POA: Diagnosis not present

## 2015-05-25 LAB — CBC WITH DIFFERENTIAL/PLATELET
BASOS ABS: 0 10*3/uL (ref 0.0–0.1)
BASOS PCT: 0.3 % (ref 0.0–3.0)
EOS ABS: 0.1 10*3/uL (ref 0.0–0.7)
EOS PCT: 1.9 % (ref 0.0–5.0)
HEMATOCRIT: 40.2 % (ref 36.0–46.0)
HEMOGLOBIN: 13.3 g/dL (ref 12.0–15.0)
LYMPHS ABS: 1.3 10*3/uL (ref 0.7–4.0)
LYMPHS PCT: 28.3 % (ref 12.0–46.0)
MCHC: 33.2 g/dL (ref 30.0–36.0)
MCV: 85.8 fl (ref 78.0–100.0)
Monocytes Absolute: 0.4 10*3/uL (ref 0.1–1.0)
Monocytes Relative: 8.4 % (ref 3.0–12.0)
Neutro Abs: 2.8 10*3/uL (ref 1.4–7.7)
Neutrophils Relative %: 61.1 % (ref 43.0–77.0)
Platelets: 150 10*3/uL (ref 150.0–400.0)
RBC: 4.69 Mil/uL (ref 3.87–5.11)
RDW: 13.9 % (ref 11.5–15.5)
WBC: 4.5 10*3/uL (ref 4.0–10.5)

## 2015-05-25 LAB — COMPREHENSIVE METABOLIC PANEL
ALT: 20 U/L (ref 0–35)
AST: 27 U/L (ref 0–37)
Albumin: 3.9 g/dL (ref 3.5–5.2)
Alkaline Phosphatase: 78 U/L (ref 39–117)
BUN: 36 mg/dL — AB (ref 6–23)
CALCIUM: 9.5 mg/dL (ref 8.4–10.5)
CO2: 29 mEq/L (ref 19–32)
CREATININE: 1.15 mg/dL (ref 0.40–1.20)
Chloride: 108 mEq/L (ref 96–112)
GFR: 49.54 mL/min — AB (ref >60.00)
GLUCOSE: 91 mg/dL (ref 70–99)
Potassium: 4.5 mEq/L (ref 3.5–5.1)
Sodium: 141 mEq/L (ref 135–145)
Total Bilirubin: 0.6 mg/dL (ref 0.2–1.2)
Total Protein: 6.7 g/dL (ref 6.0–8.3)

## 2015-05-25 LAB — TSH: TSH: 0.1 u[IU]/mL — AB (ref 0.35–4.50)

## 2015-05-25 LAB — CK: CK TOTAL: 128 U/L (ref 7–177)

## 2015-05-25 NOTE — Progress Notes (Signed)
Pre visit review using our clinic review tool, if applicable. No additional management support is needed unless otherwise documented below in the visit note.  Cramps.  Happen throughout the day but worse at night--ongoing issue but in the past month is worse--upper and lower legs--including inner thighs and calves.  Sx are bilateral.  No clear cause.  No med changes.  She has been walking and push mowing some, unclear if that caused some of the recent sx.  She has some lupus flares occ and chills.  No fevers. No rash.  She has a h/o bruising, at baseline and not changed.    L hip pain. left--x 3-4 months---describes the pain as achy--pt states the pain is worse when she lays down and not as bothersome with walking--also left side of knee is achy.   Meds, vitals, and allergies reviewed.   ROS: See HPI.  Otherwise, noncontributory.  nad ncat Mmm rrr ctab abd soft, not ttp Back w/o midline pain but L greater troch and L distal ITB ttp.   Normal DP pulse and calf not ttp B

## 2015-05-25 NOTE — Patient Instructions (Addendum)
Ask rheumatology about possible injection for trochanteric bursitis.  See what they can offer.  Ice your hip in the meantime.  Go to the lab on the way out.  We'll contact you with your lab report. Higher potassium diet and drink plenty of fluids in the meantime.  Take care.  Glad to see you.

## 2015-05-29 DIAGNOSIS — M7060 Trochanteric bursitis, unspecified hip: Secondary | ICD-10-CM | POA: Insufficient documentation

## 2015-05-29 DIAGNOSIS — R252 Cramp and spasm: Secondary | ICD-10-CM | POA: Insufficient documentation

## 2015-05-29 NOTE — Assessment & Plan Note (Signed)
No clear cause, d/w pt about higher K diet and inc in fluids.  See notes on labs . She agrees with plan.

## 2015-05-29 NOTE — Assessment & Plan Note (Signed)
Likely, will ask for rheum input on possible injection.  D/w pt.  She agrees.

## 2015-06-22 DIAGNOSIS — E039 Hypothyroidism, unspecified: Secondary | ICD-10-CM | POA: Diagnosis not present

## 2015-06-22 DIAGNOSIS — H905 Unspecified sensorineural hearing loss: Secondary | ICD-10-CM | POA: Diagnosis not present

## 2015-06-22 DIAGNOSIS — H9191 Unspecified hearing loss, right ear: Secondary | ICD-10-CM | POA: Diagnosis not present

## 2015-06-22 DIAGNOSIS — H8102 Meniere's disease, left ear: Secondary | ICD-10-CM | POA: Diagnosis not present

## 2015-06-29 ENCOUNTER — Other Ambulatory Visit: Payer: Self-pay | Admitting: Otolaryngology

## 2015-06-29 DIAGNOSIS — H9190 Unspecified hearing loss, unspecified ear: Secondary | ICD-10-CM

## 2015-06-29 DIAGNOSIS — E039 Hypothyroidism, unspecified: Secondary | ICD-10-CM

## 2015-07-06 ENCOUNTER — Encounter: Payer: Self-pay | Admitting: Family Medicine

## 2015-07-06 ENCOUNTER — Ambulatory Visit (INDEPENDENT_AMBULATORY_CARE_PROVIDER_SITE_OTHER): Payer: Medicare Other | Admitting: Family Medicine

## 2015-07-06 VITALS — BP 132/78 | HR 70 | Temp 98.9°F | Wt 212.5 lb

## 2015-07-06 DIAGNOSIS — H9190 Unspecified hearing loss, unspecified ear: Secondary | ICD-10-CM

## 2015-07-06 NOTE — Patient Instructions (Signed)
Take care.  Don't change your meds for now.  Let me talk to Dr. Erik Obey in the meantime.

## 2015-07-06 NOTE — Progress Notes (Signed)
Pre visit review using our clinic review tool, if applicable. No additional management support is needed unless otherwise documented below in the visit note.  L ear hearing loss at baseline.  Now with R ear with an extra noise- popping, roaring, "like a car horn some of the time."  Pitch is usually constant but the volume is variable.  She can hear a voice singing voice in her ear "and it's not a song in my head."   She had seen ENT and I had talked with Dr. Erik Obey about it- her TSH was low- his concern was that her TSH was greatly elevated- this isn't the case- d/w pt.    She is worried about the whole affair "and my nerves are shot."  She can't get a MRI; patient is waiting to hear from ENT about alternate imaging options.  She has socially withdrawn due to hearing changes.    Meds, vitals, and allergies reviewed.   ROS: See HPI.  Otherwise, noncontributory.  nad ncat Hard of hearing.  No TM erythema + R TM movement on valsalva.  Nasal and OP exam wnl Neck supple

## 2015-07-09 NOTE — Assessment & Plan Note (Signed)
Now with extra changes- new noise noted in the R ear.  I called over to Dr. Noreene Filbert office to talk to him about options.   She is anxious about the recent changes.   We can update patient after I talk with ENT.  Patient agrees with plan.

## 2015-07-10 ENCOUNTER — Telehealth: Payer: Self-pay | Admitting: Family Medicine

## 2015-07-10 NOTE — Telephone Encounter (Signed)
Call pt.  Talked to Dr. Erik Obey.  Plan is for CT- they should be in touch with her soon (if not already) about scheduling that.   CT would be the next step in her w/u.   I'll defer to ENT and I'll await ENT input.   Thanks.

## 2015-07-10 NOTE — Telephone Encounter (Signed)
Patient advised.

## 2015-07-11 ENCOUNTER — Other Ambulatory Visit: Payer: Self-pay | Admitting: Otolaryngology

## 2015-07-11 DIAGNOSIS — H9190 Unspecified hearing loss, unspecified ear: Secondary | ICD-10-CM

## 2015-07-16 ENCOUNTER — Other Ambulatory Visit: Payer: Medicare Other

## 2015-07-17 ENCOUNTER — Ambulatory Visit
Admission: RE | Admit: 2015-07-17 | Discharge: 2015-07-17 | Disposition: A | Discharge status: Home / Self Care | Payer: Medicare Other | Source: Ambulatory Visit | Attending: Otolaryngology | Admitting: Otolaryngology

## 2015-07-17 DIAGNOSIS — H9193 Unspecified hearing loss, bilateral: Secondary | ICD-10-CM | POA: Diagnosis not present

## 2015-07-17 DIAGNOSIS — H9190 Unspecified hearing loss, unspecified ear: Secondary | ICD-10-CM

## 2015-07-17 MED ORDER — IOPAMIDOL (ISOVUE-300) INJECTION 61%
75.0000 mL | Freq: Once | INTRAVENOUS | Status: AC | PRN
  Administered 2015-07-17: 75 mL via INTRAVENOUS

## 2015-07-23 ENCOUNTER — Other Ambulatory Visit: Payer: Self-pay | Admitting: Family Medicine

## 2015-07-23 DIAGNOSIS — Z7952 Long term (current) use of systemic steroids: Secondary | ICD-10-CM

## 2015-07-24 ENCOUNTER — Telehealth: Payer: Self-pay

## 2015-07-24 NOTE — Telephone Encounter (Signed)
Called to notify patient of being due for a Mammogram. Patient would like to schedule an appointment with [facility]. I provided them with the contact information, and patient stated that she would call and set up an appointment.  

## 2015-07-26 ENCOUNTER — Other Ambulatory Visit (INDEPENDENT_AMBULATORY_CARE_PROVIDER_SITE_OTHER): Payer: Medicare Other

## 2015-07-26 DIAGNOSIS — E038 Other specified hypothyroidism: Secondary | ICD-10-CM

## 2015-07-26 DIAGNOSIS — E039 Hypothyroidism, unspecified: Secondary | ICD-10-CM

## 2015-07-26 DIAGNOSIS — Z7952 Long term (current) use of systemic steroids: Secondary | ICD-10-CM

## 2015-07-26 DIAGNOSIS — H9190 Unspecified hearing loss, unspecified ear: Secondary | ICD-10-CM

## 2015-07-26 LAB — TSH: TSH: 0.09 u[IU]/mL — ABNORMAL LOW (ref 0.35–4.50)

## 2015-07-26 LAB — LIPID PANEL
CHOLESTEROL: 164 mg/dL (ref 0–200)
HDL: 47.6 mg/dL (ref >39.00)
LDL Cholesterol: 89 mg/dL (ref 0–99)
NonHDL: 116.81
Total CHOL/HDL Ratio: 3
Triglycerides: 140 mg/dL (ref 0.0–149.0)
VLDL: 28 mg/dL (ref 0.0–40.0)

## 2015-08-02 ENCOUNTER — Ambulatory Visit (INDEPENDENT_AMBULATORY_CARE_PROVIDER_SITE_OTHER): Payer: Medicare Other | Admitting: Family Medicine

## 2015-08-02 ENCOUNTER — Encounter: Payer: Self-pay | Admitting: Family Medicine

## 2015-08-02 VITALS — BP 142/90 | HR 71 | Temp 99.0°F | Ht 65.0 in | Wt 214.0 lb

## 2015-08-02 DIAGNOSIS — Z1211 Encounter for screening for malignant neoplasm of colon: Secondary | ICD-10-CM

## 2015-08-02 DIAGNOSIS — Z7189 Other specified counseling: Secondary | ICD-10-CM

## 2015-08-02 DIAGNOSIS — E039 Hypothyroidism, unspecified: Secondary | ICD-10-CM

## 2015-08-02 DIAGNOSIS — Z23 Encounter for immunization: Secondary | ICD-10-CM

## 2015-08-02 DIAGNOSIS — Z0000 Encounter for general adult medical examination without abnormal findings: Secondary | ICD-10-CM | POA: Diagnosis not present

## 2015-08-02 MED ORDER — LEVOTHYROXINE SODIUM 112 MCG PO TABS: ORAL_TABLET | ORAL | Status: DC

## 2015-08-02 NOTE — Patient Instructions (Addendum)
If rheumatology doesn't set you up with a bone density test, then I can do it.   Go to the lab on the way out.  We'll contact you with your lab report (stool cards).  If the anxiety continues, then let me know.  We can work on that.   You can call for a mammogram: [facility] in about 2 months.  Cut your dose back in the meantime.  Take 1 tab a day except for 1/2 tab on Sunday.

## 2015-08-02 NOTE — Progress Notes (Signed)
Pre visit review using our clinic review tool, if applicable. No additional management support is needed unless otherwise documented below in the visit note.  I have personally reviewed the Medicare Annual Wellness questionnaire and have noted 1. The patient's medical and social history 2. Their use of alcohol, tobacco or illicit drugs 3. Their current medications and supplements 4. The patient's functional ability including ADL's, fall risks, home safety risks and hearing or visual             impairment. 5. Diet and physical activities 6. Evidence for depression or mood disorders  The patients weight, height, BMI have been recorded in the chart and visual acuity is per eye clinic.  I have made referrals, counseling and provided education to the patient based review of the above and I have provided the pt with a written personalized care plan for preventive services.  Provider list updated- see scanned forms.  Routine anticipatory guidance given to patient.  See health maintenance.  Flu encouraged Shingles N/A due to med use, d/w pt.  PNA updated  Tetanus 2011 D/w patient [id number] for colon cancer screening, including IFOB vs. colonoscopy.  Risks and benefits of both were discussed and patient voiced understanding.  Pt elects [id number].  Breast cancer screening- she'll call for mammogram.  DXA to be done by rheum, she'll notify me if f/u needed here.  Advance directive- both daughters designated if patient were incapacitated.   Cognitive function addressed- see scanned forms- and if abnormal then additional documentation follows.  Anxiety.  More panicky in public.  Now with hearing aids and that may help.  She feels safe at home.  She agreed to update me as needed.    Hypothyroidism.  TSH still low. No neck mass.  No ADE on meds.  D/w pt about labs.  Compliant with meds.    PMH and SH reviewed  Meds, vitals, and allergies reviewed.   ROS: See HPI.  Otherwise negative.    GEN:  nad, alert and oriented HEENT: mucous membranes moist NECK: supple w/o LA CV: rrr. PULM: ctab, no inc wob ABD: soft, +bs EXT: no edema SKIN: no acute rash

## 2015-08-03 NOTE — Assessment & Plan Note (Signed)
Flu encouraged Shingles N/A due to med use, d/w pt.  PNA updated  Tetanus 2011 D/w patient [id number] for colon cancer screening, including IFOB vs. colonoscopy.  Risks and benefits of both were discussed and patient voiced understanding.  Pt elects BOF:PULG.  Breast cancer screening- she'll call for mammogram.  DXA to be done by rheum, she'll notify me if f/u needed here.  Advance directive- both daughters designated if patient were incapacitated.   Cognitive function addressed- see scanned forms- and if abnormal then additional documentation follows.  Anxiety.  More panicky in public.  Now with hearing aids and that may help.  She feels safe at home.  She agreed to update me as needed.

## 2015-08-03 NOTE — Assessment & Plan Note (Signed)
Continue 156mcg per tab.  Take 1 tab a day except for 1/2 tab on Sunday. Recheck TSH in about 2 months.  She agrees.

## 2015-08-16 ENCOUNTER — Other Ambulatory Visit: Payer: Self-pay

## 2015-08-16 DIAGNOSIS — Z1231 Encounter for screening mammogram for malignant neoplasm of breast: Secondary | ICD-10-CM

## 2015-08-17 ENCOUNTER — Ambulatory Visit (INDEPENDENT_AMBULATORY_CARE_PROVIDER_SITE_OTHER): Payer: Medicare Other | Admitting: Family Medicine

## 2015-08-17 ENCOUNTER — Encounter: Payer: Self-pay | Admitting: Family Medicine

## 2015-08-17 VITALS — BP 130/72 | HR 75 | Temp 98.2°F | Wt 214.5 lb

## 2015-08-17 DIAGNOSIS — F411 Generalized anxiety disorder: Secondary | ICD-10-CM

## 2015-08-17 DIAGNOSIS — F419 Anxiety disorder, unspecified: Secondary | ICD-10-CM | POA: Diagnosis not present

## 2015-08-17 MED ORDER — ESCITALOPRAM OXALATE 10 MG PO TABS: 10.0000 mg | ORAL_TABLET | Freq: Every day | ORAL | Status: DC

## 2015-08-17 NOTE — Patient Instructions (Signed)
Start taking lexapro 10mg  a day.  That should help but it will take a few weeks to make a difference.   I'd like you to update me in about 2 weeks (please call the clinic).   If you aren't improving or if you have other concerns, then let me know.  Take care.  Glad to see you.

## 2015-08-17 NOTE — Assessment & Plan Note (Signed)
D/w pt.  No SI/HI, okay for outpatient f/u.  Bothersome to the point of needing treatment.  D/w pt about options.  Start lexapro 10mg  a day.  Routine instructions given.  She agrees.  >25 minutes spent in face to face time with patient, >50% spent in counselling or coordination of care.

## 2015-08-17 NOTE — Progress Notes (Signed)
Pre visit review using our clinic review tool, if applicable. No additional management support is needed unless otherwise documented below in the visit note.  Tired.  Anxious.  She has more social phobia/withdrawal.  She isn't answering the phone when people call.  She isn't leaving her house much.  Some tearfulness.  She still enjoys reading.  Sleeping okay.  Occ napping during the day.  She has hearing aids.  She is downcast about her situation- "everything I do is wrong."  We had talked about her anxiety level prev.  We opted not to change anything at that point.    Meds, vitals, and allergies reviewed.   ROS: See HPI.  Otherwise, noncontributory.  GEN: nad, alert and oriented HEENT: mucous membranes moist NECK: supple w/o LA CV: rrr PULM: ctab, no inc wob Affect flat, slightly tearful.   Regains composure.  Speech wnl, judgment wnl No SI/HI.

## 2015-08-21 DIAGNOSIS — M329 Systemic lupus erythematosus, unspecified: Secondary | ICD-10-CM | POA: Diagnosis not present

## 2015-08-21 DIAGNOSIS — Z79899 Other long term (current) drug therapy: Secondary | ICD-10-CM | POA: Diagnosis not present

## 2015-09-02 ENCOUNTER — Telehealth: Payer: Self-pay | Admitting: Family Medicine

## 2015-09-02 NOTE — Telephone Encounter (Signed)
Please get phone follow up on patient.  Recent lexapro start.   How is he anxiety/mood?  Thanks.

## 2015-09-04 NOTE — Telephone Encounter (Signed)
Pt returned Regina's phone call Rollene Fare was in the room with a patient). She can be reached at [phone].

## 2015-09-04 NOTE — Telephone Encounter (Signed)
Just wanted to make sure she was doing okay, tolerating med.  Agree with 4 week timeline.  Thanks for update.   Have patient update [place] as needed.   Glad she is doing some better.  Thanks.

## 2015-09-04 NOTE — Telephone Encounter (Signed)
Left message on answering machine to call back.

## 2015-09-04 NOTE — Telephone Encounter (Signed)
Spoke to patient and was advised that she is doing a little better and can tell a difference. Patient stated that the pharmacist told her that it can take up to 4 weeks for it to work completely. Patient stated that she has only been on the medication for 2 weeks.

## 2015-09-04 NOTE — Telephone Encounter (Signed)
Error made, wrong phone number to return call. Correct number is [phone].

## 2015-09-04 NOTE — Telephone Encounter (Signed)
Patient notified as instructed by telephone and verbalized understanding. 

## 2015-09-27 ENCOUNTER — Ambulatory Visit: Payer: Medicare Other

## 2015-10-15 ENCOUNTER — Other Ambulatory Visit: Payer: Medicare Other

## 2015-10-25 ENCOUNTER — Ambulatory Visit (INDEPENDENT_AMBULATORY_CARE_PROVIDER_SITE_OTHER): Payer: Medicare Other | Admitting: Family Medicine

## 2015-10-25 ENCOUNTER — Encounter: Payer: Self-pay | Admitting: Family Medicine

## 2015-10-25 VITALS — BP 120/70 | HR 67 | Temp 98.6°F | Wt 217.8 lb

## 2015-10-25 DIAGNOSIS — R0602 Shortness of breath: Secondary | ICD-10-CM

## 2015-10-25 DIAGNOSIS — E039 Hypothyroidism, unspecified: Secondary | ICD-10-CM | POA: Diagnosis not present

## 2015-10-25 NOTE — Patient Instructions (Addendum)
Don't change your meds for now.  We'll contact you with your lab report. I'm doubt this is all from the lexapro.   Rosaria Ferries will call about your referral. In the meantime, limit exertion.  If you have an episode of shortness of breath, then check your pulse (we need to know if your pulse is above 100). If the episode doesn't pass quickly with rest or if you have chest pain, then go to the ER.  Take care.  Glad to see you.

## 2015-10-25 NOTE — Progress Notes (Signed)
Pre visit review using our clinic review tool, if applicable. No additional management support is needed unless otherwise documented below in the visit note.   "For years I had the weak spells and I had always blamed it on lupus."  It has gotten worse in the last few weeks.  She gets weak and trembles with 15 min of exertion, washing dishes, making a bed.  She'll have to rest after taking a shower.    Lexapro helped her anxiety, but unclear if it is causing her current sx.  No CP.  She can occ get SOB with exertion, better with rest.  Recent episodes of weakness don't seem to be related to meals.  This isn't like a typical low sugar for her.  She has been using her cane with walking.  She fell about 2.5 weeks ago, when her feet got tangled up, no LOC.  Bruising resolving on the L upper arm.    No focal changes, ie not a focal weakness in an arm or leg. She does have a baseline hand tremor B- h/o essential tremor- that isn't changed.    She has had some B leg aches she attributed to lupus.    Meds, vitals, and allergies reviewed.   ROS: See HPI.  Otherwise, noncontributory.  GEN: nad, alert and oriented HEENT: mucous membranes moist NECK: supple w/o LA CV: rrr.  PULM: ctab, no inc wob ABD: soft, +bs EXT: no edema SKIN: no acute rash but bruise noted on L upper arm Chronic IP joint changes on the hands B  Faint B hand tremor noted, symmetric Ambulated in the clinic w/o drop in pulse ox or tachycardia

## 2015-10-25 NOTE — Assessment & Plan Note (Addendum)
EKG reviewed, compared to prev.  No acute ST changes but looks to have abnormal P wave pattern, I question if she has had A flutter that was occ symptomatic.  At this point, w/o tachycardia, wouldn't start a BB but would continue ASA.  Will refer to cards and will check basic labs in the meantime.  I suspect her sx aren't from lexapro start.  D/w pt.  She agrees.  Continue med for now.  >25 minutes spent in face to face time with patient, >50% spent in counselling or coordination of care.  See AVS re: cautions.  At this point, since sx have been going on intermittently for weeks w/o chest pain, appears okay for outpatient f/u.  In NAD.  She agrees.

## 2015-10-26 LAB — CBC WITH DIFFERENTIAL/PLATELET
Basophils Absolute: 0 10*3/uL (ref 0.0–0.1)
Basophils Relative: 0.2 % (ref 0.0–3.0)
EOS PCT: 0.8 % (ref 0.0–5.0)
Eosinophils Absolute: 0.1 10*3/uL (ref 0.0–0.7)
HEMATOCRIT: 41.8 % (ref 36.0–46.0)
HEMOGLOBIN: 14.1 g/dL (ref 12.0–15.0)
LYMPHS ABS: 1.4 10*3/uL (ref 0.7–4.0)
Lymphocytes Relative: 20.3 % (ref 12.0–46.0)
MCHC: 33.8 g/dL (ref 30.0–36.0)
MCV: 86.3 fl (ref 78.0–100.0)
MONO ABS: 0.3 10*3/uL (ref 0.1–1.0)
MONOS PCT: 4.1 % (ref 3.0–12.0)
NEUTROS ABS: 5.3 10*3/uL (ref 1.4–7.7)
Neutrophils Relative %: 74.6 % (ref 43.0–77.0)
Platelets: 164 10*3/uL (ref 150.0–400.0)
RBC: 4.84 Mil/uL (ref 3.87–5.11)
RDW: 13.6 % (ref 11.5–15.5)
WBC: 7.1 10*3/uL (ref 4.0–10.5)

## 2015-10-26 LAB — COMPREHENSIVE METABOLIC PANEL
ALBUMIN: 4 g/dL (ref 3.5–5.2)
ALK PHOS: 87 U/L (ref 39–117)
ALT: 21 U/L (ref 0–35)
AST: 27 U/L (ref 0–37)
BILIRUBIN TOTAL: 0.5 mg/dL (ref 0.2–1.2)
BUN: 30 mg/dL — ABNORMAL HIGH (ref 6–23)
CALCIUM: 9.8 mg/dL (ref 8.4–10.5)
CO2: 27 mEq/L (ref 19–32)
Chloride: 107 mEq/L (ref 96–112)
Creatinine, Ser: 1.25 mg/dL — ABNORMAL HIGH (ref 0.40–1.20)
GFR: 44.94 mL/min — AB (ref >60.00)
GLUCOSE: 90 mg/dL (ref 70–99)
Potassium: 5.1 mEq/L (ref 3.5–5.1)
Sodium: 143 mEq/L (ref 135–145)
TOTAL PROTEIN: 7.2 g/dL (ref 6.0–8.3)

## 2015-10-26 LAB — BRAIN NATRIURETIC PEPTIDE: PRO B NATRI PEPTIDE: 144 pg/mL — AB (ref 0.0–100.0)

## 2015-10-26 LAB — TSH: TSH: 0.29 u[IU]/mL — AB (ref 0.35–4.50)

## 2015-10-27 ENCOUNTER — Other Ambulatory Visit: Payer: Self-pay | Admitting: Family Medicine

## 2015-10-27 DIAGNOSIS — E039 Hypothyroidism, unspecified: Secondary | ICD-10-CM

## 2015-10-27 MED ORDER — LEVOTHYROXINE SODIUM 112 MCG PO TABS: ORAL_TABLET | ORAL | Status: DC

## 2015-10-29 ENCOUNTER — Other Ambulatory Visit: Payer: Self-pay | Admitting: Family Medicine

## 2015-10-30 ENCOUNTER — Telehealth: Payer: Self-pay | Admitting: Family Medicine

## 2015-10-30 NOTE — Telephone Encounter (Signed)
Pt returned your call.  

## 2015-10-31 NOTE — Telephone Encounter (Signed)
Called patient back and lab results given.

## 2015-11-12 ENCOUNTER — Encounter: Payer: Self-pay | Admitting: Cardiovascular Disease

## 2015-11-12 ENCOUNTER — Ambulatory Visit (INDEPENDENT_AMBULATORY_CARE_PROVIDER_SITE_OTHER): Payer: Medicare Other | Admitting: Cardiovascular Disease

## 2015-11-12 VITALS — BP 124/80 | HR 69 | Ht 65.0 in | Wt 216.0 lb

## 2015-11-12 DIAGNOSIS — R0602 Shortness of breath: Secondary | ICD-10-CM | POA: Diagnosis not present

## 2015-11-12 DIAGNOSIS — I5032 Chronic diastolic (congestive) heart failure: Secondary | ICD-10-CM

## 2015-11-12 NOTE — Patient Instructions (Signed)
Your physician recommends that you schedule a follow-up appointment AS NEEDED.  Your physician has requested that you have an echocardiogram at [facility] [address]. Echocardiography is a painless test that uses sound waves to create images of your heart. It provides your doctor with information about the size and shape of your heart and how well your heart's chambers and valves are working. This procedure takes approximately one hour. There are no restrictions for this procedure.   Your physician has requested that you have a lexiscan myoview at [facility] office. For further information please visit [email or web address]. Please follow instruction sheet, as given.  Will contact you with results.

## 2015-11-12 NOTE — Progress Notes (Signed)
Cardiology Office Note   Date:  11/12/2015   ID:  Hannah, Pitts 1946/11/23, MRN [id number]  PCP:  Crawford Givens, MD  Cardiologist:   Madilyn Hook, MD   Chief Complaint  Patient presents with  . Shortness of Breath      History of Present Illness: Hannah Pitts is a 69 y.o. female with lupus, hypothyroidism and pericarditis who presents for an evaluation of an abnormal EKG.  Hannah Pitts saw Dr. Crawford Givens on 10/27 and was noted to have an abnormal EKG.  There was concern for atrial flutter so she was referred to cardiology for further evaluation.  Hannah Pitts has noted weakness and shortness of breath with exertion. This has been ongoing for at least 2-3 years but her symptoms have been getting worse recently.  She has dyspnea on exertion even with walking to the mailbox and back.  She gets weak and trembly.  She denies chest pain, lightheadedness or dizziness. She denies nausea but occsionally hs diaphoresis.  She denies lower extremity edema, orthopnea or PND.  [name] reports some episodes of palpitations in the past but none recently. She states that she was previously on a medication to help with abnormal heart rhythms but does not recall what it was called.    Past Medical History  Diagnosis Date  . Systemic lupus erythematosus (HCC)   . Generalized hyperhidrosis   . Dysthymic disorder   . Abnormal involuntary movements(781.0)   . Depressive disorder, not elsewhere classified   . Urticaria, unspecified   . Unspecified disease of pericardium     Pericarditis   . Anemia of other chronic disease   . Diaphragmatic hernia without mention of obstruction or gangrene   . Other rheumatoid arthritis with visceral or systemic involvement   . Personal history of other disorders of nervous system and sense organs   . Unspecified hypothyroidism   . GERD (gastroesophageal reflux disease)   . Meniere's disease of left ear   . H/O hiatal hernia   . Migraine    "used to have classic kind; not anymore; last one was in 2012"  . Lymphoma (HCC) 03/2010    "they got it all w/surgery"  . Palpitations     Past Surgical History  Procedure Laterality Date  . Cholecystectomy  1974  . Lumbar disc surgery  1977  . Shunt      put in behind left ear ; Menier's syndrome  . Esophagogastroduodenoscopy  01-24-11    Slight stenosis esoph spasm  . Appendectomy  1974  . Breast biopsy  03/2010    left breast;   . Breast lumpectomy  03/2010    left breast; nonHodgkins lymphoma; "not breast cancer"  . Abdominal hysterectomy  1978  . Left vats  12/1997  . Esophageal dilation  ?02/2011     Current Outpatient Prescriptions  Medication Sig Dispense Refill  . albuterol (PROVENTIL HFA;VENTOLIN HFA) 108 (90 BASE) MCG/ACT inhaler Inhale 2 puffs into the lungs every 6 (six) hours as needed for wheezing or shortness of breath. 1 Inhaler 2  . aspirin (ASPIRIN LOW DOSE) 81 MG EC tablet Take 81 mg by mouth daily.      . Cholecalciferol (VITAMIN D) 1000 UNITS capsule 1 once daily by mouth     . escitalopram (LEXAPRO) 10 MG tablet Take 1 tablet (10 mg total) by mouth daily. 90 tablet 3  . hydroxychloroquine (PLAQUENIL) 200 MG tablet Take by mouth 2 (two) times daily.      Marland Kitchen  levothyroxine (SYNTHROID, LEVOTHROID) 112 MCG tablet TAKE 1 TABLET BY MOUTH EVERY DAY 90 tablet 3  . omeprazole (PRILOSEC) 40 MG capsule TAKE ONE CAPSULE BY MOUTH EVERY DAY 90 capsule 3  . Polyethyl Glycol-Propyl Glycol (SYSTANE OP) Apply to eye as needed.    . predniSONE (DELTASONE) 5 MG tablet Take 5 mg by mouth daily.     No current facility-administered medications for this visit.    Allergies:   Review of patient's allergies indicates no known allergies.    Social History:  The patient  reports that she has never smoked. She has never used smokeless tobacco. She reports that she does not drink alcohol or use illicit drugs.   Family History:  The patient's family history includes Breast cancer in  her maternal aunt; COPD in her sister; Cancer in her sister, sister, and sister; Coronary artery disease in her brother, sister, sister, and sister; Diabetes in her brother; Heart failure in her brother, mother, and sister; Stroke in her father. There is no history of Colon cancer.    ROS:  Please see the history of present illness.   Otherwise, review of systems are positive for none.   All other systems are reviewed and negative.    PHYSICAL EXAM: VS:  BP 124/80 mmHg  Pulse 69  Ht 5\' 5"  (1.651 m)  Wt 97.977 kg (216 lb)  BMI 35.94 kg/m2  SpO2 96% , BMI Body mass index is 35.94 kg/(m^2). GENERAL:  Well appearing.  Essential tremor. HEENT:  Pupils equal round and reactive, fundi not visualized, oral mucosa unremarkable NECK:  JVP 1 cm above clavicle at 45 degrees.  +HJR.  Waveform within normal limits, carotid upstroke brisk and symmetric, no bruits, no thyromegaly LYMPHATICS:  No cervical adenopathy LUNGS:  Clear to auscultation bilaterally HEART:  RRR.  PMI not displaced or sustained,S1 and S2 within normal limits, no S3, no S4, no clicks, no rubs, no murmurs ABD:  Flat, positive bowel sounds normal in frequency in pitch, no bruits, no rebound, no guarding, no midline pulsatile mass, no hepatomegaly, no splenomegaly EXT:  2 plus pulses throughout, no edema, no cyanosis no clubbing SKIN:  No rashes no nodules NEURO:  Cranial nerves II through XII grossly intact, motor grossly intact throughout.  Resting tremor. PSYCH:  Cognitively intact, oriented to person place and time    EKG:  EKG is ordered today. The ekg ordered today demonstrates sinus rhythm rate 75 bpm.   Recent Labs: 10/25/2015: ALT 21; BUN 30*; Creatinine, Ser 1.25*; Hemoglobin 14.1; Platelets 164.0; Potassium 5.1; Pro B Natriuretic peptide (BNP) 144.0*; Sodium 143; TSH 0.29*    Lipid Panel    Component Value Date/Time   CHOL 164 07/26/2015 0816   TRIG 140.0 07/26/2015 0816   HDL 47.60 07/26/2015 0816   CHOLHDL 3  07/26/2015 0816   VLDL 28.0 07/26/2015 0816   LDLCALC 89 07/26/2015 0816      Wt Readings from Last 3 Encounters:  11/12/15 97.977 kg (216 lb)  10/25/15 98.771 kg (217 lb 12 oz)  08/17/15 97.297 kg (214 lb 8 oz)      ASSESSMENT AND PLAN:  # Abnormal EKG: Ms. Devita's prior EKG showed what appeared to be flutter waves in lead V1. However these are not seen in other leads. Other leads revealed abnormal P-wave morphology. This likely occurred due to her resting tremor and not atrial flutter. Her EKG today is normal.  # Shortness of breath: Ms. Gapinski's symptoms are concerning for either ischemia or heart  failure. She does not have any evidence of valvular heart disease on exam. It unlikely that she has systolic dysfunction but may have some diastolic dysfunction or pulmonary hypertension. It's also possible that this could be due to ischemia. She also has lupus, which can cause pericardial effusion and pericarditis. There is no evidence of pericarditis on her EKG. We will obtain a Lexiscan Cardiolite and a transthoracic echo.  Her TSH is still low, and hyperthyroidism could certainly be contributing to her symptoms. Her thyroid medication has been decreased by her PCP appropriately.  Current medicines are reviewed at length with the patient today.  The patient does not have concerns regarding medicines.  The following changes have been made:  no change  Labs/ tests ordered today include:   Orders Placed This Encounter  Procedures  . Myocardial Perfusion Imaging  . EKG 12-Lead  . ECHOCARDIOGRAM COMPLETE     Disposition:   FU with Tera Pellicane C. Duke Salvia, MD as needed.   Signed, Madilyn Hook, MD  11/12/2015 5:55 PM    [facility]

## 2015-11-15 ENCOUNTER — Telehealth (HOSPITAL_COMMUNITY): Payer: Self-pay

## 2015-11-15 NOTE — Telephone Encounter (Signed)
Encounter complete. 

## 2015-11-20 ENCOUNTER — Ambulatory Visit (HOSPITAL_COMMUNITY)
Admission: RE | Admit: 2015-11-20 | Discharge: 2015-11-20 | Disposition: A | Discharge status: Home / Self Care | Payer: Medicare Other | Source: Ambulatory Visit | Attending: Cardiovascular Disease | Admitting: Cardiovascular Disease

## 2015-11-20 DIAGNOSIS — R0602 Shortness of breath: Secondary | ICD-10-CM | POA: Insufficient documentation

## 2015-11-20 DIAGNOSIS — R531 Weakness: Secondary | ICD-10-CM | POA: Insufficient documentation

## 2015-11-20 DIAGNOSIS — R0609 Other forms of dyspnea: Secondary | ICD-10-CM | POA: Diagnosis not present

## 2015-11-20 DIAGNOSIS — Z8249 Family history of ischemic heart disease and other diseases of the circulatory system: Secondary | ICD-10-CM | POA: Diagnosis not present

## 2015-11-20 DIAGNOSIS — E669 Obesity, unspecified: Secondary | ICD-10-CM | POA: Insufficient documentation

## 2015-11-20 DIAGNOSIS — Z6836 Body mass index (BMI) 36.0-36.9, adult: Secondary | ICD-10-CM | POA: Diagnosis not present

## 2015-11-20 DIAGNOSIS — R5383 Other fatigue: Secondary | ICD-10-CM | POA: Insufficient documentation

## 2015-11-20 DIAGNOSIS — I739 Peripheral vascular disease, unspecified: Secondary | ICD-10-CM | POA: Insufficient documentation

## 2015-11-20 DIAGNOSIS — I5032 Chronic diastolic (congestive) heart failure: Secondary | ICD-10-CM | POA: Insufficient documentation

## 2015-11-20 LAB — MYOCARDIAL PERFUSION IMAGING
CHL CUP RESTING HR STRESS: 65 {beats}/min
CSEPPHR: 86 {beats}/min
LVDIAVOL: 83 mL
LVSYSVOL: 34 mL
NUC STRESS TID: 1.16
SDS: 0
SRS: 0
SSS: 0

## 2015-11-20 MED ORDER — TECHNETIUM TC 99M SESTAMIBI GENERIC - CARDIOLITE
31.6000 | Freq: Once | INTRAVENOUS | Status: AC | PRN
  Administered 2015-11-20: 31.6 via INTRAVENOUS

## 2015-11-20 MED ORDER — REGADENOSON 0.4 MG/5ML IV SOLN
0.4000 mg | Freq: Once | INTRAVENOUS | Status: AC
  Administered 2015-11-20: 0.4 mg via INTRAVENOUS

## 2015-11-20 MED ORDER — TECHNETIUM TC 99M SESTAMIBI GENERIC - CARDIOLITE
11.0000 | Freq: Once | INTRAVENOUS | Status: AC | PRN
  Administered 2015-11-20: 11 via INTRAVENOUS

## 2015-11-21 ENCOUNTER — Telehealth: Payer: Self-pay | Admitting: *Deleted

## 2015-11-21 NOTE — Telephone Encounter (Signed)
Pt is returning Sharon's call in regards to some results  Thanks

## 2015-11-21 NOTE — Telephone Encounter (Signed)
Spoke to patient. Result given . Verbalized understanding  

## 2015-11-21 NOTE — Telephone Encounter (Signed)
Left message to call back  

## 2015-11-21 NOTE — Telephone Encounter (Signed)
-----   Message from Skeet Latch, MD sent at 11/21/2015  1:51 PM EST ----- Normal stress test

## 2015-11-27 ENCOUNTER — Other Ambulatory Visit: Payer: Self-pay

## 2015-11-27 ENCOUNTER — Ambulatory Visit (HOSPITAL_COMMUNITY): Payer: Medicare Other | Attending: Cardiovascular Disease

## 2015-11-27 DIAGNOSIS — I351 Nonrheumatic aortic (valve) insufficiency: Secondary | ICD-10-CM | POA: Insufficient documentation

## 2015-11-27 DIAGNOSIS — I340 Nonrheumatic mitral (valve) insufficiency: Secondary | ICD-10-CM | POA: Diagnosis not present

## 2015-11-27 DIAGNOSIS — R0602 Shortness of breath: Secondary | ICD-10-CM | POA: Diagnosis not present

## 2015-11-27 DIAGNOSIS — I517 Cardiomegaly: Secondary | ICD-10-CM | POA: Diagnosis not present

## 2015-11-27 DIAGNOSIS — I509 Heart failure, unspecified: Secondary | ICD-10-CM | POA: Diagnosis present

## 2015-11-27 DIAGNOSIS — I5032 Chronic diastolic (congestive) heart failure: Secondary | ICD-10-CM | POA: Diagnosis not present

## 2015-11-28 ENCOUNTER — Telehealth: Payer: Self-pay | Admitting: *Deleted

## 2015-11-28 DIAGNOSIS — R0602 Shortness of breath: Secondary | ICD-10-CM

## 2015-11-28 DIAGNOSIS — I351 Nonrheumatic aortic (valve) insufficiency: Secondary | ICD-10-CM

## 2015-11-28 NOTE — Telephone Encounter (Signed)
Spoke to patient. Result given . Verbalized understanding Echo order for 10/2016 Recall  Appointment for 10/2016

## 2015-11-28 NOTE — Telephone Encounter (Signed)
-----   Message from Skeet Latch, MD sent at 11/28/2015  8:07 AM EST ----- her heart does not relax completely.  It will be important keep her blood pressure well-controlled.  There was mild leakingof the aortic and valve that will need to be followed over time.  Please schedule 1 year follow-up

## 2016-01-02 ENCOUNTER — Other Ambulatory Visit: Payer: Medicare Other

## 2016-02-21 ENCOUNTER — Ambulatory Visit: Payer: Medicare Other | Admitting: Family Medicine

## 2016-02-25 ENCOUNTER — Ambulatory Visit (INDEPENDENT_AMBULATORY_CARE_PROVIDER_SITE_OTHER): Payer: Medicare Other | Admitting: Family Medicine

## 2016-02-25 ENCOUNTER — Encounter: Payer: Self-pay | Admitting: Family Medicine

## 2016-02-25 VITALS — BP 142/86 | HR 71 | Temp 98.5°F | Wt 228.5 lb

## 2016-02-25 DIAGNOSIS — R251 Tremor, unspecified: Secondary | ICD-10-CM | POA: Diagnosis not present

## 2016-02-25 DIAGNOSIS — F419 Anxiety disorder, unspecified: Secondary | ICD-10-CM

## 2016-02-25 DIAGNOSIS — E039 Hypothyroidism, unspecified: Secondary | ICD-10-CM

## 2016-02-25 LAB — TSH: TSH: 1.47 u[IU]/mL (ref 0.35–4.50)

## 2016-02-25 MED ORDER — METOPROLOL SUCCINATE ER 25 MG PO TB24: 25.0000 mg | ORAL_TABLET | Freq: Every day | ORAL | Status: DC

## 2016-02-25 MED ORDER — LEVOTHYROXINE SODIUM 112 MCG PO TABS: 112.0000 ug | ORAL_TABLET | Freq: Every day | ORAL | Status: DC

## 2016-02-25 NOTE — Assessment & Plan Note (Signed)
Likely essential tremor, add on metoprolol.  D/w pt. May help with anxiety and BP.  She'll update me.  Okay for outpatient f/u.

## 2016-02-25 NOTE — Assessment & Plan Note (Signed)
Recheck tsh pending.

## 2016-02-25 NOTE — Addendum Note (Signed)
Addended by: Ellamae Sia on: 02/25/2016 03:26 PM   Modules accepted: Orders

## 2016-02-25 NOTE — Assessment & Plan Note (Signed)
Some better with SSRI, would continue as is.  BB may help some.  She agrees.  Okay for outpatient f/u.

## 2016-02-25 NOTE — Progress Notes (Signed)
Pre visit review using our clinic review tool, if applicable. No additional management support is needed unless otherwise documented below in the visit note.  She has had some HA when her BP is up.  Compliant with meds.  BP has been up to 160s/80s at home recently, occ lower.    She still has some anxiety related to being out in crowds, ie going to the grocery store.  She doesn't want to go out in public.  "This isn't me."  It took some emotional effort for her to come into the clinic today.  She hasn't been to church in months.  Still on lexapro w/o ADE and she thinks that has helped some.    She still has some tremor, worse when she is nervous.  She has FH tremor.    Due for recheck TSH today.    Meds, vitals, and allergies reviewed.   ROS: See HPI.  Otherwise, noncontributory.   nad ncat Mmm OP wnl Neck supple rrr ctab abd soft B hand tremor noted but no leg or jaw tremor

## 2016-02-25 NOTE — Patient Instructions (Addendum)
Go to the lab on the way out.  We'll contact you with your lab report (TSH).   Start taking metoprolol.  See if that helps any of the anxiety, high blood pressure, and tremor.  Let me know in about two weeks.  Take care. Glad to see you.

## 2016-03-10 ENCOUNTER — Telehealth: Payer: Self-pay

## 2016-03-10 NOTE — Telephone Encounter (Signed)
Pt left /vm; metoprolol has helped the BP; BP averages 147-152/80s; but no help with the tremors; the tremors are the same.pt still feels anxious. [facility].

## 2016-03-11 MED ORDER — METOPROLOL SUCCINATE ER 25 MG PO TB24: 50.0000 mg | ORAL_TABLET | Freq: Every day | ORAL | Status: DC

## 2016-03-11 NOTE — Telephone Encounter (Signed)
I would try to up the metoprolol, taking 2 a day.  Can take 2 at the same time or 1 BID. Either is okay.   Please update me in about 1 week. Thanks.  Med list updated.  We can send in a new rx later on if needed.

## 2016-03-11 NOTE — Telephone Encounter (Signed)
Patient advised.

## 2016-03-11 NOTE — Telephone Encounter (Signed)
Left message on patient's voicemail to return call

## 2016-03-11 NOTE — Telephone Encounter (Signed)
Patient returned Lugene's call.  Please call her back after 4:00 at [phone]

## 2016-04-23 NOTE — Telephone Encounter (Signed)
Pt has been taking metoprolol 25 mg taking 2 tabs (50 mg) in AM. If Dr Damita Dunnings is going to keep pt on same med please send refill to [facility]. Pt said the tremors might be slightly better but not much difference. Pt still feels anxious, pt stays at home all the time. BP averages 150-160/80s -91. Heart rate in the 50's. Pt request cb.

## 2016-04-24 ENCOUNTER — Other Ambulatory Visit: Payer: Self-pay | Admitting: *Deleted

## 2016-04-24 MED ORDER — ESCITALOPRAM OXALATE 10 MG PO TABS
20.0000 mg | ORAL_TABLET | Freq: Every day | ORAL | Status: DC
Start: 2016-04-24 — End: 2016-04-24

## 2016-04-24 MED ORDER — ESCITALOPRAM OXALATE 20 MG PO TABS: 20.0000 mg | ORAL_TABLET | Freq: Every day | ORAL | Status: DC

## 2016-04-24 MED ORDER — METOPROLOL SUCCINATE ER 50 MG PO TB24: 50.0000 mg | ORAL_TABLET | Freq: Every day | ORAL | Status: DC

## 2016-04-24 NOTE — Telephone Encounter (Addendum)
I sent the 50mg  of metoprolol, to take just 1 pill a day.  I would go up on the lexapro to 20mg  a day, 2x10mg  a day taken at the same time.  I updated the EMR.   See if that helps the anxiety.   Let me know if she needs a new rx for that.  Thanks.

## 2016-04-24 NOTE — Addendum Note (Signed)
Addended by: Tonia Ghent on: 04/24/2016 02:03 PM   Modules accepted: Orders

## 2016-04-24 NOTE — Telephone Encounter (Signed)
Patient advised.  New Rx for Lexapro sent also to pharmacy.

## 2016-07-31 ENCOUNTER — Ambulatory Visit: Payer: Medicare Other | Admitting: Family Medicine

## 2016-07-31 ENCOUNTER — Telehealth: Payer: Self-pay | Admitting: Family Medicine

## 2016-07-31 NOTE — Telephone Encounter (Signed)
Patient Name: Hannah Pitts  DOB: [birth year]    Initial Comment Caller states c/o shortness of breath. (patient is hard of hearing.)   Nurse Assessment  Nurse: Raphael Gibney, RN, Vanita Ingles Date/Time (Eastern Time): 07/31/2016 8:45:48 AM  Confirm and document reason for call. If symptomatic, describe symptoms. You must click the next button to save text entered. ---Caller states she is SOB with exertion. At rest, she does not have any SOB. Feels like she has something in her throat. Coughs when she eats which is not new. No edema in feet.  Has the patient traveled out of the country within the last 30 days? ---Not Applicable  Does the patient have any new or worsening symptoms? ---Yes  Will a triage be completed? ---Yes  Related visit to physician within the last 2 weeks? ---No  Does the PT have any chronic conditions? (i.e. diabetes, asthma, etc.) ---Yes  List chronic conditions. ---lupus, leaky heart valve  Is this a behavioral health or substance abuse call? ---No     Guidelines    Guideline Title Affirmed Question Affirmed Notes  Breathing Difficulty [1] MILD difficulty breathing (e.g., minimal/no SOB at rest, SOB with walking, pulse <100) AND [2] NEW-onset or WORSE than normal    Final Disposition User   See Physician within 4 Hours (or PCP triage) Raphael Gibney, RN, Vera    Comments  No appts available at [facility]. Pt does not want to go to another office and is not sure if she will go to urgent care or ER.   Referrals  GO TO FACILITY UNDECIDED   Disagree/Comply: Comply

## 2016-07-31 NOTE — Telephone Encounter (Signed)
I spoke with pt and she does not want to go to UC or ED; pt said her daughter will be home this afternoon and she will have her take her to appt with Dr Diona Browner today at 3:15. Pt said if condition changes or worsens prior to appt she will call 911 and go to hospital.

## 2016-08-05 ENCOUNTER — Ambulatory Visit (INDEPENDENT_AMBULATORY_CARE_PROVIDER_SITE_OTHER): Payer: Medicare Other | Admitting: Family Medicine

## 2016-08-05 ENCOUNTER — Encounter: Payer: Self-pay | Admitting: Family Medicine

## 2016-08-05 VITALS — BP 122/82 | HR 92 | Temp 99.0°F | Wt 237.2 lb

## 2016-08-05 DIAGNOSIS — Y92099 Unspecified place in other non-institutional residence as the place of occurrence of the external cause: Secondary | ICD-10-CM

## 2016-08-05 DIAGNOSIS — K219 Gastro-esophageal reflux disease without esophagitis: Secondary | ICD-10-CM

## 2016-08-05 DIAGNOSIS — W19XXXA Unspecified fall, initial encounter: Secondary | ICD-10-CM | POA: Diagnosis not present

## 2016-08-05 DIAGNOSIS — Y92009 Unspecified place in unspecified non-institutional (private) residence as the place of occurrence of the external cause: Secondary | ICD-10-CM

## 2016-08-05 DIAGNOSIS — R5383 Other fatigue: Secondary | ICD-10-CM | POA: Diagnosis not present

## 2016-08-05 DIAGNOSIS — L509 Urticaria, unspecified: Secondary | ICD-10-CM | POA: Diagnosis not present

## 2016-08-05 LAB — CBC WITH DIFFERENTIAL/PLATELET
BASOS PCT: 0.3 % (ref 0.0–3.0)
Basophils Absolute: 0 10*3/uL (ref 0.0–0.1)
EOS PCT: 1.1 % (ref 0.0–5.0)
Eosinophils Absolute: 0.1 10*3/uL (ref 0.0–0.7)
HEMATOCRIT: 40.3 % (ref 36.0–46.0)
HEMOGLOBIN: 13.4 g/dL (ref 12.0–15.0)
Lymphocytes Relative: 19.5 % (ref 12.0–46.0)
Lymphs Abs: 1.4 10*3/uL (ref 0.7–4.0)
MCHC: 33.2 g/dL (ref 30.0–36.0)
MCV: 88 fl (ref 78.0–100.0)
MONO ABS: 0.6 10*3/uL (ref 0.1–1.0)
MONOS PCT: 8.2 % (ref 3.0–12.0)
Neutro Abs: 5 10*3/uL (ref 1.4–7.7)
Neutrophils Relative %: 70.9 % (ref 43.0–77.0)
Platelets: 159 10*3/uL (ref 150.0–400.0)
RBC: 4.58 Mil/uL (ref 3.87–5.11)
RDW: 14 % (ref 11.5–15.5)
WBC: 7.1 10*3/uL (ref 4.0–10.5)

## 2016-08-05 LAB — COMPREHENSIVE METABOLIC PANEL
ALBUMIN: 4 g/dL (ref 3.5–5.2)
ALK PHOS: 86 U/L (ref 39–117)
ALT: 16 U/L (ref 0–35)
AST: 19 U/L (ref 0–37)
BUN: 21 mg/dL (ref 6–23)
CHLORIDE: 106 meq/L (ref 96–112)
CO2: 29 mEq/L (ref 19–32)
Calcium: 9.5 mg/dL (ref 8.4–10.5)
Creatinine, Ser: 1.23 mg/dL — ABNORMAL HIGH (ref 0.40–1.20)
GFR: 45.69 mL/min — AB (ref >60.00)
Glucose, Bld: 94 mg/dL (ref 70–99)
POTASSIUM: 4.8 meq/L (ref 3.5–5.1)
SODIUM: 143 meq/L (ref 135–145)
TOTAL PROTEIN: 7.1 g/dL (ref 6.0–8.3)
Total Bilirubin: 0.5 mg/dL (ref 0.2–1.2)

## 2016-08-05 LAB — TSH: TSH: 5.86 u[IU]/mL — AB (ref 0.35–4.50)

## 2016-08-05 MED ORDER — LORATADINE 10 MG PO TABS: 10.0000 mg | ORAL_TABLET | Freq: Every day | ORAL | Status: DC

## 2016-08-05 MED ORDER — RANITIDINE HCL 150 MG PO TABS: 150.0000 mg | ORAL_TABLET | Freq: Two times a day (BID) | ORAL | Status: DC

## 2016-08-05 NOTE — Progress Notes (Signed)
"  I'm doing terrible."    Dec in energy, fatigue- noted about 3 months after starting lexapro.  Some wheeze with exercise, better with rest.  No chest pain.    Also on metoprolol.  Tremor was some better with metoprolol.  Tremor is worse when she is tired.  Still on prednisone and plaquenil.   Slept up to 20 hours in a day recently.  Would get up, go to BR, and then back to bed.  "I could lay down on the floor and go to sleep."    Using her cane.  Has fallen.  She has balance troubles at baseline.  D/w pt about using walker when outside.  She needed order for walker.    Recently noted hives, intermittently.  Going on this summer.  Usually on the abd or where clothing is pressed against skin.  Daughter and granddaughter have similar.  Has used hydrocortisone topically prn.  She had similar years ago.  She took benadryl and was noted to more fatigued than normal.   Voice has been deeper and coarser recently.  No ST but has frequent throat clearing.    Meds, vitals, and allergies reviewed.   ROS: Per HPI unless specifically indicated in ROS section   GEN: nad, alert and oriented HEENT: mucous membranes moist NECK: supple w/o LA CV: rrr PULM: ctab, no inc wob, no wheeze but UAN noise noted ABD: soft, +bs EXT: no edema SKIN: no acute rash Tremor in B hands noted.  Chronic IP joint changes noted in the hands w/o acute erythema.

## 2016-08-05 NOTE — Progress Notes (Signed)
Pre visit review using our clinic review tool, if applicable. No additional management support is needed unless otherwise documented below in the visit note. 

## 2016-08-05 NOTE — Patient Instructions (Addendum)
Go to the lab on the way out.  We'll contact you with your lab report. See about getting a walker and let me know if you want to go to PT.  Add on claritin 10mg  a day and zantac 150mg  twice a day.  That should help the upper airway/throat symptoms and the hives.  If you still have fatigue that isn't explained by the labs, then cut the lexapro in half.  It will take a few weeks for the lower dose to have an effect.  Update me as needed.  Take care.  Glad to see you.

## 2016-08-06 DIAGNOSIS — L509 Urticaria, unspecified: Secondary | ICD-10-CM | POA: Insufficient documentation

## 2016-08-06 DIAGNOSIS — M810 Age-related osteoporosis without current pathological fracture: Secondary | ICD-10-CM | POA: Diagnosis not present

## 2016-08-06 DIAGNOSIS — M329 Systemic lupus erythematosus, unspecified: Secondary | ICD-10-CM | POA: Diagnosis not present

## 2016-08-06 DIAGNOSIS — Z79899 Other long term (current) drug therapy: Secondary | ICD-10-CM | POA: Diagnosis not present

## 2016-08-06 NOTE — Assessment & Plan Note (Signed)
Could contribute to upper airway noise/voice change, H2 blocker should help.   She doesn't have pulmonary findings, ie true wheeze, on exam.  D/w pt.

## 2016-08-06 NOTE — Assessment & Plan Note (Signed)
Unclear source, check basic labs.  Unclear if possible from excessive dosing with SSRI.  If labs unremarkable, she may need lower dose of SSRI.  See notes on labs.  See AVS. >25 minutes spent in face to face time with patient, >50% spent in counselling or coordination of care.

## 2016-08-06 NOTE — Assessment & Plan Note (Signed)
rx done for rolling walker with seat, rx written and given to patient.  Encouraged her to consider PT.  She'll consider.

## 2016-08-06 NOTE — Assessment & Plan Note (Signed)
Start zantac and claritin.  She has a FH noted.  If not better, then she'll update me.

## 2016-11-08 ENCOUNTER — Other Ambulatory Visit: Payer: Self-pay | Admitting: Family Medicine

## 2016-11-18 ENCOUNTER — Ambulatory Visit: Payer: Medicare Other

## 2016-12-08 ENCOUNTER — Emergency Department (HOSPITAL_COMMUNITY)
Admission: EM | Admit: 2016-12-08 | Discharge: 2016-12-08 | Disposition: A | Discharge status: Home / Self Care | Payer: Medicare Other | Attending: Emergency Medicine | Admitting: Emergency Medicine

## 2016-12-08 ENCOUNTER — Emergency Department (HOSPITAL_COMMUNITY): Payer: Medicare Other

## 2016-12-08 ENCOUNTER — Encounter (HOSPITAL_COMMUNITY): Payer: Self-pay | Admitting: *Deleted

## 2016-12-08 DIAGNOSIS — Z7982 Long term (current) use of aspirin: Secondary | ICD-10-CM | POA: Insufficient documentation

## 2016-12-08 DIAGNOSIS — M5442 Lumbago with sciatica, left side: Secondary | ICD-10-CM | POA: Insufficient documentation

## 2016-12-08 DIAGNOSIS — M545 Low back pain: Secondary | ICD-10-CM | POA: Diagnosis present

## 2016-12-08 DIAGNOSIS — R0902 Hypoxemia: Secondary | ICD-10-CM | POA: Diagnosis not present

## 2016-12-08 DIAGNOSIS — M5136 Other intervertebral disc degeneration, lumbar region: Secondary | ICD-10-CM | POA: Diagnosis not present

## 2016-12-08 DIAGNOSIS — E039 Hypothyroidism, unspecified: Secondary | ICD-10-CM | POA: Insufficient documentation

## 2016-12-08 MED ORDER — PREDNISONE 20 MG PO TABS: ORAL_TABLET | ORAL | 0 refills | Status: DC

## 2016-12-08 MED ORDER — IBUPROFEN 200 MG PO TABS
400.0000 mg | ORAL_TABLET | Freq: Once | ORAL | Status: AC
  Administered 2016-12-08: 400 mg via ORAL
  Filled 2016-12-08: qty 2

## 2016-12-08 MED ORDER — PREDNISONE 20 MG PO TABS
60.0000 mg | ORAL_TABLET | Freq: Once | ORAL | Status: AC
  Administered 2016-12-08: 60 mg via ORAL
  Filled 2016-12-08: qty 3

## 2016-12-08 NOTE — ED Triage Notes (Signed)
EMS picked up at home.  Patient is alert and oriented x4.  She is hard of hearing.  Patient is complaining of lower back pain that started 2 weeks ago.  Currently she rates her pain laying down her pain is 8 of 10.

## 2016-12-08 NOTE — Discharge Instructions (Signed)
Take the prescribed medication as directed.  May continue taking tylenol with this. Follow-up with your primary care doctor. Return to the ED for new or worsening symptoms.

## 2016-12-08 NOTE — ED Provider Notes (Signed)
[facility] DEPT Provider Note   CSN: [id number] Arrival date & time: 12/08/16  1120     History   Chief Complaint Chief Complaint  Patient presents with  . Back Pain    HPI Hannah Pitts is a 70 y.o. female.  The history is provided by the patient and medical records.    70 year old female with history of chronic involuntary movements, anemia,&, dysthymic disorder, migraine headaches, hypothyroidism, lupus, presenting to the ED for back pain. Patient denies any recent injury, trauma, or falls within the past 6 months. States pain has been present for the past 2 weeks. Pain is localized to her left lower back with some radiation into the left buttock and posterior thigh. She denies any numbness or weakness of her legs. She denies any urinary symptoms, bowel, or bladder incontinence.  No fever, chills, sweats.  She did take some tylenol this morning which has helped with her pain mildly.  Patient does report remote history of back surgery in 1977 secondary to herniated disks. She has not had any further instrumentation or surgeries since this time.  Past Medical History:  Diagnosis Date  . Abnormal involuntary movements(781.0)   . Anemia of other chronic disease   . Depressive disorder, not elsewhere classified   . Diaphragmatic hernia without mention of obstruction or gangrene   . Dysthymic disorder   . Generalized hyperhidrosis   . GERD (gastroesophageal reflux disease)   . H/O hiatal hernia   . Lymphoma ([place]) 03/2010   "they got it all w/surgery"  . Meniere's disease of left ear   . Migraine    "used to have classic kind; not anymore; last one was in 2012"  . Other rheumatoid arthritis with visceral or systemic involvement   . Palpitations   . Personal history of other disorders of nervous system and sense organs   . Systemic lupus erythematosus ([place])   . Unspecified disease of pericardium    Pericarditis   . Unspecified hypothyroidism   . Urticaria, unspecified       Patient Active Problem List   Diagnosis Date Noted  . Hives 08/06/2016  . Fall at home 08/05/2016  . Trochanteric bursitis 05/29/2015  . Muscle cramp 05/29/2015  . Hearing loss 12/12/2014  . Low back pain 10/11/2014  . Advance care planning 07/31/2014  . Abnormality of gait 07/31/2014  . Dyspnea on exertion 07/20/2013  . Obesity 07/20/2013  . Fatigue 05/20/2013  . Medicare annual wellness visit, subsequent 07/27/2012  . Palpitations 03/23/2012  . Anxiety 06/10/2011  . RASH AND OTHER NONSPECIFIC SKIN ERUPTION 09/13/2010  . NON-HODGKIN'S LYMPHOMA, B-CELL OF BREAST 04/05/2010  . DYSPHAGIA UNSPECIFIED 03/12/2010  . LUPUS 09/25/2009  . SHORTNESS OF BREATH 03/28/2009  . SKIN LESION 09/21/2007  . Tremor 05/28/2007  . Hypothyroidism 05/27/2007  . ANEMIA OF CHRONIC DISEASE 05/27/2007  . PERICARDITIS 05/27/2007  . PERIPHERAL VASCULAR DISEASE 05/27/2007  . GERD 05/27/2007  . HIATAL HERNIA 05/27/2007  . ARTHRITIS, RHEUMATOID, SYSTEMIC NEC 05/27/2007  . MENIERE'S DISEASE, HX OF 05/27/2007    Past Surgical History:  Procedure Laterality Date  . ABDOMINAL HYSTERECTOMY  1978  . APPENDECTOMY  1974  . BREAST BIOPSY  03/2010   left breast;   . BREAST LUMPECTOMY  03/2010   left breast; nonHodgkins lymphoma; "not breast cancer"  . CHOLECYSTECTOMY  1974  . ESOPHAGEAL DILATION  ?02/2011  . ESOPHAGOGASTRODUODENOSCOPY  01-24-11   Slight stenosis esoph spasm  . Left VATS  12/1997  . [place]  .  shunt     put in behind left ear ; Menier's syndrome    OB History    No data available       Home Medications    Prior to Admission medications   Medication Sig Start Date End Date Taking? Authorizing Provider  acetaminophen (TYLENOL) 325 MG tablet Take 650 mg by mouth every 6 (six) hours as needed.   Yes Historical Provider, MD  albuterol (PROVENTIL HFA;VENTOLIN HFA) 108 (90 BASE) MCG/ACT inhaler Inhale 2 puffs into the lungs every 6 (six) hours as needed for wheezing or  shortness of breath. 12/12/14  Yes Tonia Ghent, MD  aspirin (ASPIRIN LOW DOSE) 81 MG EC tablet Take 81 mg by mouth daily.     Yes Historical Provider, MD  Cholecalciferol (VITAMIN D) 1000 UNITS capsule 1 once daily by mouth    Yes Historical Provider, MD  escitalopram (LEXAPRO) 20 MG tablet Take 1 tablet (20 mg total) by mouth daily. 04/24/16  Yes Tonia Ghent, MD  hydroxychloroquine (PLAQUENIL) 200 MG tablet Take by mouth 2 (two) times daily.     Yes Historical Provider, MD  levothyroxine (SYNTHROID, LEVOTHROID) 112 MCG tablet Take 1 tablet (112 mcg total) by mouth daily. Except for 1/2 tab on Sunday and Wednesday 02/25/16  Yes Tonia Ghent, MD  loratadine (CLARITIN) 10 MG tablet Take 1 tablet (10 mg total) by mouth daily. 08/05/16  Yes Tonia Ghent, MD  metoprolol succinate (TOPROL-XL) 50 MG 24 hr tablet TAKE 1 TABLET BY MOUTH EVERY DAY 11/10/16  Yes Tonia Ghent, MD  Multiple Vitamin (MULTIVITAMIN WITH MINERALS) TABS tablet Take 1 tablet by mouth daily.   Yes Historical Provider, MD  predniSONE (DELTASONE) 5 MG tablet Take 5 mg by mouth daily.   Yes Historical Provider, MD  ranitidine (ZANTAC) 150 MG tablet Take 1 tablet (150 mg total) by mouth 2 (two) times daily. 08/05/16  Yes Tonia Ghent, MD    Family History Family History  Problem Relation Age of Onset  . Heart failure Mother   . Stroke Father   . Coronary artery disease Sister   . COPD Sister   . Coronary artery disease Brother   . Diabetes Brother   . Heart failure Brother   . Coronary artery disease Sister   . Coronary artery disease Sister   . Cancer Sister   . Heart failure Sister   . Cancer Sister     lung, esoph  . Cancer Sister     lung  . Breast cancer Maternal Aunt   . Colon cancer Neg Hx     Social History Social History  Substance Use Topics  . Smoking status: Never Smoker  . Smokeless tobacco: Never Used  . Alcohol use No     Allergies   Patient has no known allergies.   Review of  Systems Review of Systems  Musculoskeletal: Positive for back pain.  All other systems reviewed and are negative.    Physical Exam Updated Vital Signs BP 125/74 (BP Location: Left Arm)   Pulse 77   Temp 97.9 F (36.6 C) (Oral)   Resp 16   Ht 5\' 5"  (1.651 m)   Wt 108.9 kg   SpO2 97%   BMI 39.94 kg/m   Physical Exam  Constitutional: She is oriented to person, place, and time. She appears well-developed and well-nourished.  HENT:  Head: Normocephalic and atraumatic.  Mouth/Throat: Oropharynx is clear and moist.  Eyes: Conjunctivae and EOM are normal. Pupils  are equal, round, and reactive to light.  Neck: Normal range of motion.  Cardiovascular: Normal rate, regular rhythm and normal heart sounds.   Pulmonary/Chest: Effort normal and breath sounds normal. No respiratory distress. She has no wheezes.  Abdominal: Soft. Bowel sounds are normal.  Musculoskeletal: Normal range of motion.  Left SI joint without focal tenderness; endorses dull ache in this area with some radiation into posterior thigh; no leg swelling or deformities; + pain with ROM of left leg but not a definitive SLR; normal strength and sensation of both legs  Neurological: She is alert and oriented to person, place, and time.  Skin: Skin is warm and dry.  Psychiatric: She has a normal mood and affect.  Nursing note and vitals reviewed.    ED Treatments / Results  Labs (all labs ordered are listed, but only abnormal results are displayed) Labs Reviewed - No data to display  EKG  EKG Interpretation None       Radiology Dg Lumbar Spine Complete  Result Date: 12/08/2016 CLINICAL DATA:  Left side back pain x 2 weeks. No known injury. Pt states hx of herniated disc with surgical repair in 1977. Hx lymphoma, systemic lupus erythematosus. EXAM: LUMBAR SPINE - COMPLETE 4+ VIEW COMPARISON:  None. FINDINGS: No fracture.  No spondylolisthesis. Mild loss of disc height at L4-L5 and L5-S1. Remaining disc spaces are  well preserved. Facet joints are relatively well preserved. Mild curvature of the mid lumbar spine, convex the left, which may be fixed or positional. Soft tissues are unremarkable. IMPRESSION: 1. No fracture or acute finding. 2. Mild disc degenerative changes at L4-L5 and L5-S1. Electronically Signed   By: Lajean Manes M.D.   On: 12/08/2016 12:57    Procedures Procedures (including critical care time)  Medications Ordered in ED Medications  predniSONE (DELTASONE) tablet 60 mg (60 mg Oral Given 12/08/16 1205)  ibuprofen (ADVIL,MOTRIN) tablet 400 mg (400 mg Oral Given 12/08/16 1205)     Initial Impression / Assessment and Plan / ED Course  I have reviewed the triage vital signs and the nursing notes.  Pertinent labs & imaging results that were available during my care of the patient were reviewed by me and considered in my medical decision making (see chart for details).  Clinical Course    71 year old female here with left lower back pain for the past 2 weeks. No recent injury, trauma, or falls.  Pain localized to the left SI joint without focal tenderness on exam. She has pain with range of motion of the left leg without definitive straight leg raise. Both legs are neurovascularly intact with normal strength and sensation. Screening x-ray obtained, no acute findings. She does have degenerative changes at L4-L5 and L5-S1. Patient is treated here with prednisone and reports improvement of her symptoms. She takes a low-dose of this daily because of her lupus. Will increase her dosing for the next few days for added relief. She may continue taking Tylenol. Patient will prefer not to take pain medication as it often makes her hallucinate. Feel this is reasonable. She will follow-up with her primary care doctor if not improving in the next few days.  Discussed plan with patient and her daughter at bedside, they both acknowledged understanding and agreed with plan of care.  Return precautions given  for new or worsening symptoms.  Final Clinical Impressions(s) / ED Diagnoses   Final diagnoses:  Acute left-sided low back pain with left-sided sciatica    New Prescriptions New Prescriptions  PREDNISONE (DELTASONE) 20 MG TABLET    Take 40 mg by mouth daily for 3 days, then 20mg  by mouth daily for 3 days, then 10mg  daily for 3 days     Larene Pickett, PA-C 12/08/16 1501    Daleen Bo, MD 12/10/16 2042

## 2016-12-08 NOTE — ED Notes (Signed)
Bed: [id number] Expected date:  Expected time:  Means of arrival:  Comments: EMS- 70yo F, back pain radiating into leg

## 2017-01-05 ENCOUNTER — Other Ambulatory Visit (HOSPITAL_COMMUNITY): Payer: Medicare Other

## 2017-01-05 ENCOUNTER — Other Ambulatory Visit: Payer: Self-pay | Admitting: Family Medicine

## 2017-01-12 ENCOUNTER — Other Ambulatory Visit: Payer: Self-pay | Admitting: Family Medicine

## 2017-01-12 DIAGNOSIS — Z119 Encounter for screening for infectious and parasitic diseases, unspecified: Secondary | ICD-10-CM

## 2017-01-12 DIAGNOSIS — Z7952 Long term (current) use of systemic steroids: Secondary | ICD-10-CM

## 2017-01-12 DIAGNOSIS — E039 Hypothyroidism, unspecified: Secondary | ICD-10-CM

## 2017-01-13 ENCOUNTER — Ambulatory Visit: Payer: Medicare Other

## 2017-01-20 ENCOUNTER — Encounter: Payer: Medicare Other | Admitting: Family Medicine

## 2017-02-18 ENCOUNTER — Other Ambulatory Visit: Payer: Self-pay | Admitting: Family Medicine

## 2017-02-18 NOTE — Telephone Encounter (Signed)
Received refill request electronically Last office visit 08/05/16 Patient has cancelled several appointments

## 2017-02-19 NOTE — Telephone Encounter (Signed)
Sent. Schedule when possible for cpe this summer.  Thanks.

## 2017-03-10 ENCOUNTER — Encounter: Payer: Medicare Other | Admitting: Family Medicine

## 2017-03-20 ENCOUNTER — Encounter: Payer: Medicare Other | Admitting: Family Medicine

## 2017-04-28 ENCOUNTER — Ambulatory Visit (INDEPENDENT_AMBULATORY_CARE_PROVIDER_SITE_OTHER): Payer: Medicare Other | Admitting: Family Medicine

## 2017-04-28 ENCOUNTER — Encounter: Payer: Self-pay | Admitting: Family Medicine

## 2017-04-28 VITALS — BP 130/80 | HR 63 | Temp 98.4°F | Ht 65.0 in | Wt 240.8 lb

## 2017-04-28 DIAGNOSIS — F419 Anxiety disorder, unspecified: Secondary | ICD-10-CM | POA: Diagnosis not present

## 2017-04-28 DIAGNOSIS — Z119 Encounter for screening for infectious and parasitic diseases, unspecified: Secondary | ICD-10-CM | POA: Diagnosis not present

## 2017-04-28 DIAGNOSIS — R1310 Dysphagia, unspecified: Secondary | ICD-10-CM

## 2017-04-28 DIAGNOSIS — E039 Hypothyroidism, unspecified: Secondary | ICD-10-CM

## 2017-04-28 DIAGNOSIS — Z0000 Encounter for general adult medical examination without abnormal findings: Secondary | ICD-10-CM

## 2017-04-28 DIAGNOSIS — H9190 Unspecified hearing loss, unspecified ear: Secondary | ICD-10-CM

## 2017-04-28 DIAGNOSIS — Z1211 Encounter for screening for malignant neoplasm of colon: Secondary | ICD-10-CM | POA: Diagnosis not present

## 2017-04-28 DIAGNOSIS — Z7189 Other specified counseling: Secondary | ICD-10-CM

## 2017-04-28 DIAGNOSIS — Z7952 Long term (current) use of systemic steroids: Secondary | ICD-10-CM

## 2017-04-28 LAB — LIPID PANEL
CHOL/HDL RATIO: 4
Cholesterol: 194 mg/dL (ref 0–200)
HDL: 47.1 mg/dL (ref >39.00)
LDL Cholesterol: 115 mg/dL — ABNORMAL HIGH (ref 0–99)
NonHDL: 147.09
Triglycerides: 159 mg/dL — ABNORMAL HIGH (ref 0.0–149.0)
VLDL: 31.8 mg/dL (ref 0.0–40.0)

## 2017-04-28 LAB — COMPREHENSIVE METABOLIC PANEL WITH GFR
ALT: 16 U/L (ref 0–35)
AST: 28 U/L (ref 0–37)
Albumin: 4 g/dL (ref 3.5–5.2)
Alkaline Phosphatase: 78 U/L (ref 39–117)
BUN: 35 mg/dL — ABNORMAL HIGH (ref 6–23)
CO2: 33 meq/L — ABNORMAL HIGH (ref 19–32)
Calcium: 9.9 mg/dL (ref 8.4–10.5)
Chloride: 104 meq/L (ref 96–112)
Creatinine, Ser: 1.31 mg/dL — ABNORMAL HIGH (ref 0.40–1.20)
GFR: 42.39 mL/min — ABNORMAL LOW
Glucose, Bld: 87 mg/dL (ref 70–99)
Potassium: 5.1 meq/L (ref 3.5–5.1)
Sodium: 142 meq/L (ref 135–145)
Total Bilirubin: 0.4 mg/dL (ref 0.2–1.2)
Total Protein: 7.1 g/dL (ref 6.0–8.3)

## 2017-04-28 LAB — TSH: TSH: 7.91 u[IU]/mL — ABNORMAL HIGH (ref 0.35–4.50)

## 2017-04-28 MED ORDER — ESCITALOPRAM OXALATE 20 MG PO TABS: ORAL_TABLET | ORAL | 0 refills | Status: DC

## 2017-04-28 MED ORDER — LEVOTHYROXINE SODIUM 112 MCG PO TABS: 112.0000 ug | ORAL_TABLET | Freq: Every day | ORAL | 3 refills | Status: DC

## 2017-04-28 MED ORDER — METOPROLOL SUCCINATE ER 50 MG PO TB24: 50.0000 mg | ORAL_TABLET | Freq: Every day | ORAL | 3 refills | Status: DC

## 2017-04-28 MED ORDER — LEVOTHYROXINE SODIUM 112 MCG PO TABS: 112.0000 ug | ORAL_TABLET | Freq: Every day | ORAL | Status: DC

## 2017-04-28 NOTE — Progress Notes (Signed)
I have personally reviewed the Medicare Annual Wellness questionnaire and have noted 1. The patient's medical and social history 2. Their use of alcohol, tobacco or illicit drugs 3. Their current medications and supplements 4. The patient's functional ability including ADL's, fall risks, home safety risks and hearing or visual             impairment. 5. Diet and physical activities 6. Evidence for depression or mood disorders  The patients weight, height, BMI have been recorded in the chart and visual acuity is per eye clinic.  I have made referrals, counseling and provided education to the patient based review of the above and I have provided the pt with a written personalized care plan for preventive services.  Provider list updated- see scanned forms.  Routine anticipatory guidance given to patient.  See health maintenance. The possibility exists that previously documented standard health maintenance information may have been brought forward from a previous encounter into this note.  If needed, that same information has been updated to reflect the current situation based on today's encounter.    Flu encouraged Shingles N/A due to med use, d/w pt.  PNA updated  Tetanus 2011 D/w patient [id number] for colon cancer screening, including IFOB vs. colonoscopy.  Risks and benefits of both were discussed and patient voiced understanding.  Pt elects GUR:KY[facility].  Breast cancer screening- declined, d/ wpt.  DXA prev done by rheum, she'll notify me if f/u needed here.  Advance directive- both daughters designated if patient were incapacitated.   Cognitive function addressed- see scanned forms- and if abnormal then additional documentation follows.   Now with hearing aids but she is still having troubles- d/w pt about audiology f/u.  She feels safe at home.  She agreed to update me as needed.  She gave up driving in the meantime.  This was volitional on her part.  This was mainly hearing loss related.     Hypothyroidism.  Complaint.  TSH pending. No ADE on med.  No mass in neck noted by patient.   Mood d/w pt.  Her anxiety is a little better than prev.  She was anxious about coming in today for the OV.  "I've learned to live with it."  She avoid crowds.  Still on lexapro and she describes a mild lessening overall of sx on med.  She wanted to try coming off SSRI, d/w pt.  See med orders re: taper.  She'll update me as needed.   She has some solid dysphagia at baseline but she doesn't regurgitate like she prev did.  s/p EGD prev.  D/w pt about GI referral. See orders.   She has rheum f/u pending, tomorrow.  Still on prednisone and plaquenil at baseline.  She is deconditioned and that is bothersome for her.  She is gradually getting her strength back.  She is still walking with a cane.  "I'm working my legs more now."  She can walk farther now.    PMH and SH reviewed  Meds, vitals, and allergies reviewed.   ROS: Per HPI.  Unless specifically indicated otherwise in HPI, the patient denies:  General: fever. Eyes: acute vision changes ENT: sore throat Cardiovascular: chest pain Respiratory: SOB GI: vomiting GU: dysuria Musculoskeletal: acute back pain Derm: acute rash Neuro: acute motor dysfunction Psych: worsening mood Endocrine: polydipsia Heme: bleeding Allergy: hayfever  GEN: nad, alert and oriented, hard of hearing at baseline.   HEENT: mucous membranes moist NECK: supple w/o LA, no tmg CV: rrr. PULM:  ctab, no inc wob ABD: soft, +bs EXT: no edema SKIN: no acute rash

## 2017-04-28 NOTE — Progress Notes (Signed)
Pre visit review using our clinic review tool, if applicable. No additional management support is needed unless otherwise documented below in the visit note. 

## 2017-04-28 NOTE — Patient Instructions (Addendum)
Call the hearing clinic and see if they can adjust your hearing aids.  Take 1/2 tab of lexapro a day for 1 month, then stop the medicine.  Go to the lab on the way out.  We'll contact you with your lab report. Rosaria Ferries will call about your referral. Take care.  Glad to see you.

## 2017-04-29 ENCOUNTER — Other Ambulatory Visit: Payer: Self-pay | Admitting: Family Medicine

## 2017-04-29 DIAGNOSIS — M545 Low back pain: Secondary | ICD-10-CM | POA: Diagnosis not present

## 2017-04-29 DIAGNOSIS — E039 Hypothyroidism, unspecified: Secondary | ICD-10-CM

## 2017-04-29 DIAGNOSIS — Z79899 Other long term (current) drug therapy: Secondary | ICD-10-CM | POA: Diagnosis not present

## 2017-04-29 DIAGNOSIS — M329 Systemic lupus erythematosus, unspecified: Secondary | ICD-10-CM | POA: Diagnosis not present

## 2017-04-29 LAB — HEPATITIS C ANTIBODY: HCV AB: NEGATIVE

## 2017-04-29 MED ORDER — LEVOTHYROXINE SODIUM 112 MCG PO TABS: 112.0000 ug | ORAL_TABLET | Freq: Every day | ORAL | 3 refills | Status: DC

## 2017-04-29 NOTE — Assessment & Plan Note (Signed)
No thyromegaly on exam. See notes on labs.

## 2017-04-29 NOTE — Assessment & Plan Note (Signed)
Solid food dysphagia. Refer back to GI.

## 2017-04-29 NOTE — Assessment & Plan Note (Signed)
  Flu encouraged Shingles N/A due to med use, d/w pt.  PNA updated  Tetanus 2011 D/w patient [id number] for colon cancer screening, including IFOB vs. colonoscopy.  Risks and benefits of both were discussed and patient voiced understanding.  Pt elects WVX:[id number].  Breast cancer screening- declined, d/ wpt.  DXA prev done by rheum, she'll notify me if f/u needed here.  Advance directive- both daughters designated if patient were incapacitated.   Cognitive function addressed- see scanned forms- and if abnormal then additional documentation follows.

## 2017-04-29 NOTE — Assessment & Plan Note (Signed)
She wanted to try slow taper off. Lexapro. See notes on orders. She will update me as needed.

## 2017-04-29 NOTE — Assessment & Plan Note (Signed)
d/w pt about audiology f/u.  She feels safe at home.  She agreed to update me as needed.  She gave up driving in the meantime.  This was volitional on her part.  This was mainly hearing loss related.

## 2017-04-29 NOTE — Assessment & Plan Note (Signed)
Advance directive- both daughters designated if patient were incapacitated.  

## 2017-05-01 ENCOUNTER — Encounter: Payer: Self-pay | Admitting: Family Medicine

## 2017-05-01 DIAGNOSIS — N390 Urinary tract infection, site not specified: Secondary | ICD-10-CM | POA: Diagnosis not present

## 2017-05-01 DIAGNOSIS — M329 Systemic lupus erythematosus, unspecified: Secondary | ICD-10-CM | POA: Diagnosis not present

## 2017-05-20 ENCOUNTER — Other Ambulatory Visit: Payer: Self-pay | Admitting: Family Medicine

## 2017-06-19 DIAGNOSIS — M6281 Muscle weakness (generalized): Secondary | ICD-10-CM | POA: Diagnosis not present

## 2017-06-19 DIAGNOSIS — Z79899 Other long term (current) drug therapy: Secondary | ICD-10-CM | POA: Diagnosis not present

## 2017-06-19 DIAGNOSIS — M545 Low back pain: Secondary | ICD-10-CM | POA: Diagnosis not present

## 2017-06-19 DIAGNOSIS — M329 Systemic lupus erythematosus, unspecified: Secondary | ICD-10-CM | POA: Diagnosis not present

## 2017-07-03 ENCOUNTER — Other Ambulatory Visit (INDEPENDENT_AMBULATORY_CARE_PROVIDER_SITE_OTHER): Payer: Medicare Other

## 2017-07-03 ENCOUNTER — Encounter: Payer: Self-pay | Admitting: Family Medicine

## 2017-07-03 ENCOUNTER — Ambulatory Visit (INDEPENDENT_AMBULATORY_CARE_PROVIDER_SITE_OTHER): Payer: Medicare Other | Admitting: Family Medicine

## 2017-07-03 ENCOUNTER — Ambulatory Visit (INDEPENDENT_AMBULATORY_CARE_PROVIDER_SITE_OTHER)
Admission: RE | Admit: 2017-07-03 | Discharge: 2017-07-03 | Disposition: A | Discharge status: Home / Self Care | Payer: Medicare Other | Source: Ambulatory Visit | Attending: Family Medicine | Admitting: Family Medicine

## 2017-07-03 VITALS — BP 122/80 | HR 76 | Temp 98.3°F | Wt 233.5 lb

## 2017-07-03 DIAGNOSIS — E039 Hypothyroidism, unspecified: Secondary | ICD-10-CM

## 2017-07-03 DIAGNOSIS — R0602 Shortness of breath: Secondary | ICD-10-CM | POA: Diagnosis not present

## 2017-07-03 LAB — TSH: TSH: 1.15 u[IU]/mL (ref 0.35–4.50)

## 2017-07-03 NOTE — Progress Notes (Signed)
She tapered off SSRI and has been off for about 1 month.  About 10 days ago, she had to call EMS.  She felt SOB at the time.  She had overeaten and "I couldn't get no air out."  EMS checked her, her "lungs were clear and my heart was normal" per patient report.  She didn't go to ER.  Ever since then she has felt weak with decrease in appetite.  "it's better but it's still there some."  No chest pain initially but some later that night.  She admits to getting panicked about the episode.    She has seen rheum and is trying to taper prednisone. She was worried about taking azathioprine, has not started that medication yet.  Question if higher pred dose affected GERD/HH/mood, d/w pt.    F/u TSH pending.  See notes on labs.  Meds, vitals, and allergies reviewed.   ROS: Per HPI unless specifically indicated in ROS section   GEN: nad, alert and oriented HEENT: mucous membranes moist NECK: supple w/o LA CV: rrr. PULM: ctab, no inc wob ABD: soft, +bs EXT: no edema SKIN: no acute rash Chronic arthritic changes noted on the fingers bilaterally. At baseline.

## 2017-07-03 NOTE — Patient Instructions (Addendum)
Go to the lab on the way out.  We'll contact you with your xray report. Start the azathioprine per rheumatology.   Consider restart of lexapro.  Take care.  Glad to see you.  Update me as needed.

## 2017-07-05 NOTE — Assessment & Plan Note (Signed)
Question if higher pred dose affected GERD/hiatal hernia/mood, d/w pt.  it would be reasonable to follow instructions from rheumatology and start azathioprine so that she can hopefully taper her prednisone more in the future. I will defer to the patient and rheumatology. Discussed with patient, she agrees. Check chest x-ray, check routine labs.  Assuming her labs and imaging are unremarkable, then she can consider if she wants to restart Lexapro. She will let me know. Okay for outpatient follow-up. >25 minutes spent in face to face time with patient, >50% spent in counselling or coordination of care.

## 2017-07-08 ENCOUNTER — Telehealth: Payer: Self-pay

## 2017-07-08 MED ORDER — ESCITALOPRAM OXALATE 10 MG PO TABS: 10.0000 mg | ORAL_TABLET | Freq: Every day | ORAL | 3 refills | Status: DC

## 2017-07-08 NOTE — Telephone Encounter (Signed)
Patient notified as instructed by telephone and verbalized understanding. 

## 2017-07-08 NOTE — Telephone Encounter (Signed)
Pt left v/m; pt was seen 07/03/17 and pt was advised to consider restarting lexapro. Pt has decided she does want to restart lexapro and request med sent to [facility].

## 2017-07-08 NOTE — Telephone Encounter (Signed)
Noted, thanks. Prescription sent. Restart at 10 mg a day. 10 mg tabs sent. This is a lower dose than previous, to make sure she can tolerate it. Update me in a few weeks about her condition, sooner if needed. Thanks.

## 2017-07-17 DIAGNOSIS — M545 Low back pain: Secondary | ICD-10-CM | POA: Diagnosis not present

## 2017-07-17 DIAGNOSIS — Z79899 Other long term (current) drug therapy: Secondary | ICD-10-CM | POA: Diagnosis not present

## 2017-07-17 DIAGNOSIS — M6281 Muscle weakness (generalized): Secondary | ICD-10-CM | POA: Diagnosis not present

## 2017-07-17 DIAGNOSIS — M329 Systemic lupus erythematosus, unspecified: Secondary | ICD-10-CM | POA: Diagnosis not present

## 2017-10-30 ENCOUNTER — Ambulatory Visit: Payer: Medicare Other | Admitting: Family Medicine

## 2017-12-12 ENCOUNTER — Emergency Department (HOSPITAL_COMMUNITY): Payer: Medicare Other

## 2017-12-12 ENCOUNTER — Inpatient Hospital Stay (HOSPITAL_COMMUNITY)
Admission: EM | Admit: 2017-12-12 | Discharge: 2017-12-24 | DRG: 166 | Disposition: A | Discharge status: Home Health | Payer: Medicare Other | Attending: Internal Medicine | Admitting: Internal Medicine

## 2017-12-12 ENCOUNTER — Other Ambulatory Visit: Payer: Self-pay

## 2017-12-12 ENCOUNTER — Encounter (HOSPITAL_COMMUNITY): Payer: Self-pay | Admitting: Emergency Medicine

## 2017-12-12 DIAGNOSIS — K760 Fatty (change of) liver, not elsewhere classified: Secondary | ICD-10-CM | POA: Diagnosis not present

## 2017-12-12 DIAGNOSIS — H9190 Unspecified hearing loss, unspecified ear: Secondary | ICD-10-CM | POA: Diagnosis present

## 2017-12-12 DIAGNOSIS — I82413 Acute embolism and thrombosis of femoral vein, bilateral: Secondary | ICD-10-CM | POA: Diagnosis present

## 2017-12-12 DIAGNOSIS — R778 Other specified abnormalities of plasma proteins: Secondary | ICD-10-CM | POA: Diagnosis present

## 2017-12-12 DIAGNOSIS — I2699 Other pulmonary embolism without acute cor pulmonale: Secondary | ICD-10-CM | POA: Diagnosis not present

## 2017-12-12 DIAGNOSIS — Z23 Encounter for immunization: Secondary | ICD-10-CM | POA: Diagnosis not present

## 2017-12-12 DIAGNOSIS — R748 Abnormal levels of other serum enzymes: Secondary | ICD-10-CM

## 2017-12-12 DIAGNOSIS — T380X5A Adverse effect of glucocorticoids and synthetic analogues, initial encounter: Secondary | ICD-10-CM | POA: Diagnosis present

## 2017-12-12 DIAGNOSIS — R0902 Hypoxemia: Secondary | ICD-10-CM | POA: Diagnosis not present

## 2017-12-12 DIAGNOSIS — J209 Acute bronchitis, unspecified: Secondary | ICD-10-CM | POA: Diagnosis present

## 2017-12-12 DIAGNOSIS — N183 Chronic kidney disease, stage 3 (moderate): Secondary | ICD-10-CM | POA: Diagnosis present

## 2017-12-12 DIAGNOSIS — R0602 Shortness of breath: Secondary | ICD-10-CM | POA: Diagnosis not present

## 2017-12-12 DIAGNOSIS — Z66 Do not resuscitate: Secondary | ICD-10-CM | POA: Diagnosis not present

## 2017-12-12 DIAGNOSIS — K219 Gastro-esophageal reflux disease without esophagitis: Secondary | ICD-10-CM | POA: Diagnosis not present

## 2017-12-12 DIAGNOSIS — E039 Hypothyroidism, unspecified: Secondary | ICD-10-CM | POA: Diagnosis not present

## 2017-12-12 DIAGNOSIS — E662 Morbid (severe) obesity with alveolar hypoventilation: Secondary | ICD-10-CM

## 2017-12-12 DIAGNOSIS — I5032 Chronic diastolic (congestive) heart failure: Secondary | ICD-10-CM | POA: Diagnosis present

## 2017-12-12 DIAGNOSIS — I2609 Other pulmonary embolism with acute cor pulmonale: Secondary | ICD-10-CM

## 2017-12-12 DIAGNOSIS — I739 Peripheral vascular disease, unspecified: Secondary | ICD-10-CM | POA: Diagnosis not present

## 2017-12-12 DIAGNOSIS — E876 Hypokalemia: Secondary | ICD-10-CM | POA: Diagnosis present

## 2017-12-12 DIAGNOSIS — J9601 Acute respiratory failure with hypoxia: Secondary | ICD-10-CM | POA: Diagnosis present

## 2017-12-12 DIAGNOSIS — I82433 Acute embolism and thrombosis of popliteal vein, bilateral: Secondary | ICD-10-CM | POA: Diagnosis not present

## 2017-12-12 DIAGNOSIS — Z6841 Body Mass Index (BMI) 40.0 and over, adult: Secondary | ICD-10-CM

## 2017-12-12 DIAGNOSIS — K5900 Constipation, unspecified: Secondary | ICD-10-CM | POA: Diagnosis present

## 2017-12-12 DIAGNOSIS — R069 Unspecified abnormalities of breathing: Secondary | ICD-10-CM | POA: Diagnosis not present

## 2017-12-12 DIAGNOSIS — J441 Chronic obstructive pulmonary disease with (acute) exacerbation: Secondary | ICD-10-CM | POA: Diagnosis present

## 2017-12-12 DIAGNOSIS — M329 Systemic lupus erythematosus, unspecified: Secondary | ICD-10-CM | POA: Diagnosis not present

## 2017-12-12 DIAGNOSIS — M3500 Sicca syndrome, unspecified: Secondary | ICD-10-CM | POA: Diagnosis not present

## 2017-12-12 DIAGNOSIS — R7989 Other specified abnormal findings of blood chemistry: Secondary | ICD-10-CM | POA: Diagnosis present

## 2017-12-12 DIAGNOSIS — Z8572 Personal history of non-Hodgkin lymphomas: Secondary | ICD-10-CM | POA: Diagnosis not present

## 2017-12-12 DIAGNOSIS — I2602 Saddle embolus of pulmonary artery with acute cor pulmonale: Secondary | ICD-10-CM | POA: Diagnosis not present

## 2017-12-12 DIAGNOSIS — R05 Cough: Secondary | ICD-10-CM | POA: Diagnosis not present

## 2017-12-12 DIAGNOSIS — I071 Rheumatic tricuspid insufficiency: Secondary | ICD-10-CM | POA: Diagnosis present

## 2017-12-12 DIAGNOSIS — N179 Acute kidney failure, unspecified: Secondary | ICD-10-CM | POA: Diagnosis present

## 2017-12-12 DIAGNOSIS — D72829 Elevated white blood cell count, unspecified: Secondary | ICD-10-CM | POA: Diagnosis present

## 2017-12-12 DIAGNOSIS — F329 Major depressive disorder, single episode, unspecified: Secondary | ICD-10-CM | POA: Diagnosis present

## 2017-12-12 DIAGNOSIS — I471 Supraventricular tachycardia: Secondary | ICD-10-CM | POA: Diagnosis present

## 2017-12-12 DIAGNOSIS — M7981 Nontraumatic hematoma of soft tissue: Secondary | ICD-10-CM | POA: Diagnosis not present

## 2017-12-12 DIAGNOSIS — I5030 Unspecified diastolic (congestive) heart failure: Secondary | ICD-10-CM | POA: Diagnosis not present

## 2017-12-12 DIAGNOSIS — I509 Heart failure, unspecified: Secondary | ICD-10-CM

## 2017-12-12 DIAGNOSIS — J440 Chronic obstructive pulmonary disease with acute lower respiratory infection: Secondary | ICD-10-CM | POA: Diagnosis present

## 2017-12-12 DIAGNOSIS — R5381 Other malaise: Secondary | ICD-10-CM | POA: Diagnosis present

## 2017-12-12 LAB — COMPREHENSIVE METABOLIC PANEL
ALT: 25 U/L (ref 14–54)
AST: 39 U/L (ref 15–41)
Albumin: 3.6 g/dL (ref 3.5–5.0)
Alkaline Phosphatase: 70 U/L (ref 38–126)
Anion gap: 13 (ref 5–15)
BILIRUBIN TOTAL: 1.1 mg/dL (ref 0.3–1.2)
BUN: 19 mg/dL (ref 6–20)
CO2: 25 mmol/L (ref 22–32)
Calcium: 8.7 mg/dL — ABNORMAL LOW (ref 8.9–10.3)
Chloride: 104 mmol/L (ref 101–111)
Creatinine, Ser: 1.38 mg/dL — ABNORMAL HIGH (ref 0.44–1.00)
GFR, EST AFRICAN AMERICAN: 43 mL/min — AB (ref >60)
GFR, EST NON AFRICAN AMERICAN: 37 mL/min — AB (ref >60)
Glucose, Bld: 132 mg/dL — ABNORMAL HIGH (ref 65–99)
POTASSIUM: 3.1 mmol/L — AB (ref 3.5–5.1)
Sodium: 142 mmol/L (ref 135–145)
TOTAL PROTEIN: 6.8 g/dL (ref 6.5–8.1)

## 2017-12-12 LAB — CBC
HEMATOCRIT: 43.2 % (ref 36.0–46.0)
Hemoglobin: 14 g/dL (ref 12.0–15.0)
MCH: 30 pg (ref 26.0–34.0)
MCHC: 32.4 g/dL (ref 30.0–36.0)
MCV: 92.7 fL (ref 78.0–100.0)
PLATELETS: 130 10*3/uL — AB (ref 150–400)
RBC: 4.66 MIL/uL (ref 3.87–5.11)
RDW: 13.4 % (ref 11.5–15.5)
WBC: 7.6 10*3/uL (ref 4.0–10.5)

## 2017-12-12 LAB — BLOOD GAS, ARTERIAL
Acid-base deficit: 2.7 mmol/L — ABNORMAL HIGH (ref 0.0–2.0)
Bicarbonate: 23.6 mmol/L (ref 20.0–28.0)
Drawn by: 295031
O2 CONTENT: 2 L/min
O2 SAT: 93 %
PATIENT TEMPERATURE: 98.6
pCO2 arterial: 49.4 mmHg — ABNORMAL HIGH (ref 32.0–48.0)
pH, Arterial: 7.3 — ABNORMAL LOW (ref 7.350–7.450)
pO2, Arterial: 74.5 mmHg — ABNORMAL LOW (ref 83.0–108.0)

## 2017-12-12 LAB — TSH: TSH: 1.166 u[IU]/mL (ref 0.350–4.500)

## 2017-12-12 LAB — PROTIME-INR
INR: 1.1
Prothrombin Time: 14.1 seconds (ref 11.4–15.2)

## 2017-12-12 LAB — MAGNESIUM: Magnesium: 1.5 mg/dL — ABNORMAL LOW (ref 1.7–2.4)

## 2017-12-12 LAB — TROPONIN I
TROPONIN I: 0.1 ng/mL — AB (ref <0.03)
TROPONIN I: 0.17 ng/mL — AB (ref <0.03)
TROPONIN I: 0.19 ng/mL — AB (ref <0.03)

## 2017-12-12 LAB — APTT: APTT: 24 s (ref 24–36)

## 2017-12-12 LAB — MRSA PCR SCREENING: MRSA by PCR: NEGATIVE

## 2017-12-12 MED ORDER — METHYLPREDNISOLONE SODIUM SUCC 125 MG IJ SOLR
125.0000 mg | Freq: Once | INTRAMUSCULAR | Status: AC
  Administered 2017-12-12: 125 mg via INTRAVENOUS
  Filled 2017-12-12: qty 2

## 2017-12-12 MED ORDER — ALBUTEROL SULFATE (2.5 MG/3ML) 0.083% IN NEBU
5.0000 mg | INHALATION_SOLUTION | Freq: Once | RESPIRATORY_TRACT | Status: AC
  Administered 2017-12-12: 5 mg via RESPIRATORY_TRACT
  Filled 2017-12-12: qty 6

## 2017-12-12 MED ORDER — INFLUENZA VAC SPLIT HIGH-DOSE 0.5 ML IM SUSY
0.5000 mL | PREFILLED_SYRINGE | INTRAMUSCULAR | Status: AC
  Administered 2017-12-13: 0.5 mL via INTRAMUSCULAR
  Filled 2017-12-12: qty 0.5

## 2017-12-12 MED ORDER — CHLORHEXIDINE GLUCONATE 0.12 % MT SOLN
15.0000 mL | Freq: Two times a day (BID) | OROMUCOSAL | Status: DC
  Administered 2017-12-12 – 2017-12-16 (×7): 15 mL via OROMUCOSAL
  Filled 2017-12-12 (×6): qty 15

## 2017-12-12 MED ORDER — ACETAMINOPHEN 325 MG PO TABS
650.0000 mg | ORAL_TABLET | Freq: Four times a day (QID) | ORAL | Status: DC | PRN
  Administered 2017-12-16 – 2017-12-19 (×6): 650 mg via ORAL
  Filled 2017-12-12 (×6): qty 2

## 2017-12-12 MED ORDER — DOCUSATE SODIUM 100 MG PO CAPS
100.0000 mg | ORAL_CAPSULE | Freq: Two times a day (BID) | ORAL | Status: DC
  Administered 2017-12-13 – 2017-12-24 (×15): 100 mg via ORAL
  Filled 2017-12-12 (×24): qty 1

## 2017-12-12 MED ORDER — METHYLPREDNISOLONE SODIUM SUCC 40 MG IJ SOLR
40.0000 mg | Freq: Four times a day (QID) | INTRAMUSCULAR | Status: DC
  Administered 2017-12-12 – 2017-12-13 (×4): 40 mg via INTRAVENOUS
  Filled 2017-12-12 (×4): qty 1

## 2017-12-12 MED ORDER — ORAL CARE MOUTH RINSE
15.0000 mL | Freq: Two times a day (BID) | OROMUCOSAL | Status: DC
  Administered 2017-12-12 – 2017-12-15 (×7): 15 mL via OROMUCOSAL

## 2017-12-12 MED ORDER — LEVOTHYROXINE SODIUM 112 MCG PO TABS
56.0000 ug | ORAL_TABLET | ORAL | Status: DC
  Administered 2017-12-13 – 2017-12-20 (×2): 56 ug via ORAL
  Filled 2017-12-12 (×6): qty 1
  Filled 2017-12-12: qty 0.5

## 2017-12-12 MED ORDER — LEVOTHYROXINE SODIUM 112 MCG PO TABS
112.0000 ug | ORAL_TABLET | ORAL | Status: DC
  Administered 2017-12-12 – 2017-12-24 (×11): 112 ug via ORAL
  Filled 2017-12-12 (×7): qty 1

## 2017-12-12 MED ORDER — HEPARIN BOLUS VIA INFUSION
2500.0000 [IU] | Freq: Once | INTRAVENOUS | Status: AC
  Administered 2017-12-12: 2500 [IU] via INTRAVENOUS
  Filled 2017-12-12: qty 2500

## 2017-12-12 MED ORDER — HEPARIN (PORCINE) IN NACL 100-0.45 UNIT/ML-% IJ SOLN
1500.0000 [IU]/h | INTRAMUSCULAR | Status: DC
  Administered 2017-12-12: 1400 [IU]/h via INTRAVENOUS
  Administered 2017-12-13: 1500 [IU]/h via INTRAVENOUS
  Filled 2017-12-12 (×2): qty 250

## 2017-12-12 MED ORDER — IPRATROPIUM BROMIDE 0.02 % IN SOLN
0.5000 mg | Freq: Once | RESPIRATORY_TRACT | Status: AC
  Administered 2017-12-12: 0.5 mg via RESPIRATORY_TRACT
  Filled 2017-12-12: qty 2.5

## 2017-12-12 MED ORDER — POTASSIUM CHLORIDE CRYS ER 20 MEQ PO TBCR
40.0000 meq | EXTENDED_RELEASE_TABLET | Freq: Every day | ORAL | Status: DC
  Administered 2017-12-12: 40 meq via ORAL
  Filled 2017-12-12 (×2): qty 2

## 2017-12-12 MED ORDER — SODIUM CHLORIDE 0.9 % IV SOLN
250.0000 mL | INTRAVENOUS | Status: DC | PRN
  Administered 2017-12-12: 250 mL via INTRAVENOUS

## 2017-12-12 MED ORDER — ESCITALOPRAM OXALATE 10 MG PO TABS
10.0000 mg | ORAL_TABLET | Freq: Every day | ORAL | Status: DC
  Administered 2017-12-12 – 2017-12-24 (×13): 10 mg via ORAL
  Filled 2017-12-12 (×13): qty 1

## 2017-12-12 MED ORDER — IOPAMIDOL (ISOVUE-370) INJECTION 76%
INTRAVENOUS | Status: AC
  Administered 2017-12-12: 70 mL via INTRAVENOUS
  Filled 2017-12-12: qty 100

## 2017-12-12 MED ORDER — IPRATROPIUM-ALBUTEROL 0.5-2.5 (3) MG/3ML IN SOLN
3.0000 mL | Freq: Four times a day (QID) | RESPIRATORY_TRACT | Status: DC
  Administered 2017-12-12 – 2017-12-14 (×11): 3 mL via RESPIRATORY_TRACT
  Filled 2017-12-12 (×11): qty 3

## 2017-12-12 MED ORDER — ONDANSETRON HCL 4 MG/2ML IJ SOLN
4.0000 mg | Freq: Once | INTRAMUSCULAR | Status: AC
  Administered 2017-12-12: 4 mg via INTRAVENOUS
  Filled 2017-12-12: qty 2

## 2017-12-12 MED ORDER — DEXTROSE 5 % IV SOLN
2.0000 g | Freq: Every day | INTRAVENOUS | Status: DC
  Administered 2017-12-12 – 2017-12-13 (×2): 2 g via INTRAVENOUS
  Filled 2017-12-12 (×2): qty 2

## 2017-12-12 MED ORDER — SODIUM CHLORIDE 0.9% FLUSH
3.0000 mL | Freq: Two times a day (BID) | INTRAVENOUS | Status: DC
  Administered 2017-12-12 – 2017-12-14 (×3): 3 mL via INTRAVENOUS

## 2017-12-12 MED ORDER — DEXTROSE 5 % IV SOLN
500.0000 mg | Freq: Every day | INTRAVENOUS | Status: AC
  Administered 2017-12-12 – 2017-12-14 (×3): 500 mg via INTRAVENOUS
  Filled 2017-12-12 (×3): qty 500

## 2017-12-12 MED ORDER — SODIUM CHLORIDE 0.9% FLUSH: 3.0000 mL | INTRAVENOUS | Status: DC | PRN

## 2017-12-12 MED ORDER — FUROSEMIDE 10 MG/ML IJ SOLN
40.0000 mg | Freq: Two times a day (BID) | INTRAMUSCULAR | Status: DC
  Administered 2017-12-12 – 2017-12-13 (×3): 40 mg via INTRAVENOUS
  Filled 2017-12-12 (×3): qty 4

## 2017-12-12 MED ORDER — ASPIRIN EC 81 MG PO TBEC
81.0000 mg | DELAYED_RELEASE_TABLET | Freq: Every day | ORAL | Status: DC
  Administered 2017-12-12 – 2017-12-13 (×2): 81 mg via ORAL
  Filled 2017-12-12 (×2): qty 1

## 2017-12-12 NOTE — ED Notes (Signed)
Bed: [facility] Expected date:  Expected time:  Means of arrival:  Comments: No bed. 

## 2017-12-12 NOTE — ED Provider Notes (Signed)
[place] DEPT Provider Note   CSN: [id number] Arrival date & time: 12/12/17  0923     History   Chief Complaint Chief Complaint  Patient presents with  . Shortness of Breath    HPI Hannah Pitts is a 71 y.o. female.   Shortness of Breath   Patient is a 71 year old female who presents the emergency department shortness of breath worse over the past 24 hours.  She has had some mild cough without significant productive nature.  Reports chills without documented fever.  No recent sick contacts.  No recent travel.  No history of DVT or pulmonary embolism.  No chest pain at this time.  She reports some degree of chronic exertional shortness of breath but reports this is been worse over the past several days.   Past Medical History:  Diagnosis Date  . Abnormal involuntary movements(781.0)   . Anemia of other chronic disease   . Depressive disorder, not elsewhere classified   . Diaphragmatic hernia without mention of obstruction or gangrene   . Dysthymic disorder   . Generalized hyperhidrosis   . GERD (gastroesophageal reflux disease)   . H/O hiatal hernia   . Lymphoma ([place]) 03/2010   "they got it all w/surgery"  . Meniere's disease of left ear   . Migraine    "used to have classic kind; not anymore; last one was in 2012"  . Other rheumatoid arthritis with visceral or systemic involvement   . Palpitations   . Personal history of other disorders of nervous system and sense organs   . Systemic lupus erythematosus ([place])   . Unspecified disease of pericardium    Pericarditis   . Unspecified hypothyroidism   . Urticaria, unspecified     Patient Active Problem List   Diagnosis Date Noted  . Hives 08/06/2016  . Fall at home 08/05/2016  . Trochanteric bursitis 05/29/2015  . Muscle cramp 05/29/2015  . Hearing loss 12/12/2014  . Low back pain 10/11/2014  . Advance care planning 07/31/2014  . Abnormality of gait 07/31/2014  . Dyspnea on  exertion 07/20/2013  . Obesity 07/20/2013  . Fatigue 05/20/2013  . Medicare annual wellness visit, subsequent 07/27/2012  . Palpitations 03/23/2012  . Anxiety 06/10/2011  . RASH AND OTHER NONSPECIFIC SKIN ERUPTION 09/13/2010  . NON-HODGKIN'S LYMPHOMA, B-CELL OF BREAST 04/05/2010  . Dysphagia 03/12/2010  . LUPUS 09/25/2009  . SOB (shortness of breath) 03/28/2009  . SKIN LESION 09/21/2007  . Tremor 05/28/2007  . Hypothyroidism 05/27/2007  . ANEMIA OF CHRONIC DISEASE 05/27/2007  . PERICARDITIS 05/27/2007  . PERIPHERAL VASCULAR DISEASE 05/27/2007  . GERD 05/27/2007  . HIATAL HERNIA 05/27/2007  . ARTHRITIS, RHEUMATOID, SYSTEMIC NEC 05/27/2007  . MENIERE'S DISEASE, HX OF 05/27/2007    Past Surgical History:  Procedure Laterality Date  . ABDOMINAL HYSTERECTOMY  1978  . APPENDECTOMY  1974  . BREAST BIOPSY  03/2010   left breast;   . BREAST LUMPECTOMY  03/2010   left breast; nonHodgkins lymphoma; "not breast cancer"  . CHOLECYSTECTOMY  1974  . ESOPHAGEAL DILATION  ?02/2011  . ESOPHAGOGASTRODUODENOSCOPY  01-24-11   Slight stenosis esoph spasm  . Left VATS  12/1997  . [place]  . shunt     put in behind left ear ; Menier's syndrome    OB History    No data available       Home Medications    Prior to Admission medications   Medication Sig Start Date End Date  Taking? Authorizing Provider  acetaminophen (TYLENOL) 325 MG tablet Take 650 mg by mouth every 6 (six) hours as needed.    [provider]  albuterol (PROVENTIL HFA;VENTOLIN HFA) 108 (90 BASE) MCG/ACT inhaler Inhale 2 puffs into the lungs every 6 (six) hours as needed for wheezing or shortness of breath. 12/12/14   Tonia Ghent, MD  aspirin (ASPIRIN LOW DOSE) 81 MG EC tablet Take 81 mg by mouth daily.      [provider]  Cholecalciferol (VITAMIN D) 1000 UNITS capsule 1 once daily by mouth     [provider]  escitalopram (LEXAPRO) 10 MG tablet Take 1 tablet (10 mg total) by  mouth daily. 07/08/17   Tonia Ghent, MD  hydroxychloroquine (PLAQUENIL) 200 MG tablet Take by mouth 2 (two) times daily.      [provider]  levothyroxine (SYNTHROID, LEVOTHROID) 112 MCG tablet Take 1 tablet (112 mcg total) by mouth daily. Except for 1/2 tab on Sun 04/29/17   Tonia Ghent, MD  loratadine (CLARITIN) 10 MG tablet Take 1 tablet (10 mg total) by mouth daily. 08/05/16   Tonia Ghent, MD  metoprolol succinate (TOPROL-XL) 50 MG 24 hr tablet Take 1 tablet (50 mg total) by mouth daily. Take with or immediately following a meal. 04/28/17   Tonia Ghent, MD  Multiple Vitamin (MULTIVITAMIN WITH MINERALS) TABS tablet Take 1 tablet by mouth daily.    [provider]  predniSONE (DELTASONE) 5 MG tablet Take 15 mg by mouth daily.     [provider]  ranitidine (ZANTAC) 150 MG tablet Take 1 tablet (150 mg total) by mouth 2 (two) times daily. 08/05/16   Tonia Ghent, MD    Family History Family History  Problem Relation Age of Onset  . Heart failure Mother   . Stroke Father   . Coronary artery disease Sister   . COPD Sister   . Coronary artery disease Brother   . Diabetes Brother   . Heart failure Brother   . Coronary artery disease Sister   . Coronary artery disease Sister   . Cancer Sister   . Heart failure Sister   . Cancer Sister        lung, esoph  . Cancer Sister        lung  . Breast cancer Maternal Aunt   . Colon cancer Neg Hx     Social History Social History   Tobacco Use  . Smoking status: Never Smoker  . Smokeless tobacco: Never Used  Substance Use Topics  . Alcohol use: No  . Drug use: No     Allergies   Patient has no known allergies.   Review of Systems Review of Systems  Respiratory: Positive for shortness of breath.   All other systems reviewed and are negative.    Physical Exam Updated Vital Signs BP 129/87 (BP Location: Left Arm)   Pulse (!) 116   Temp 99.8 F (37.7 C) (Oral)   Resp (!) 28   Ht  5\' 5"  (1.651 m)   Wt 108.9 kg (240 lb)   SpO2 99%   BMI 39.94 kg/m   Physical Exam  Constitutional: She is oriented to person, place, and time. She appears well-developed and well-nourished. No distress.  HENT:  Head: Normocephalic and atraumatic.  Eyes: EOM are normal.  Neck: Normal range of motion.  Cardiovascular: Regular rhythm and normal heart sounds.  Tachycardia  Pulmonary/Chest: Effort normal. She has wheezes.  Abdominal: Soft.  She exhibits no distension. There is no tenderness.  Musculoskeletal: Normal range of motion.  Neurological: She is alert and oriented to person, place, and time.  Skin: Skin is warm and dry.  Psychiatric: She has a normal mood and affect. Judgment normal.  Nursing note and vitals reviewed.    ED Treatments / Results  Labs (all labs ordered are listed, but only abnormal results are displayed) Labs Reviewed  CBC - Abnormal; Notable for the following components:      Result Value   Platelets 130 (*)    All other components within normal limits  COMPREHENSIVE METABOLIC PANEL - Abnormal; Notable for the following components:   Potassium 3.1 (*)    Glucose, Bld 132 (*)    Creatinine, Ser 1.38 (*)    Calcium 8.7 (*)    GFR calc non Af Amer 37 (*)    GFR calc Af Amer 43 (*)    All other components within normal limits  TROPONIN I - Abnormal; Notable for the following components:   Troponin I 0.10 (*)    All other components within normal limits    EKG  EKG Interpretation  Date/Time:  Saturday December 12 2017 04:44:19 EST Ventricular Rate:  116 PR Interval:    QRS Duration: 91 QT Interval:  337 QTC Calculation: 469 R Axis:   -36 Text Interpretation:  Sinus tachycardia Ventricular premature complex Aberrant complex Left axis deviation Abnormal R-wave progression, late transition Borderline ST depression, lateral leads No significant change was found Confirmed by Jola Schmidt [phone]) on 12/12/2017 8:13:56 AM       Radiology Dg Chest  2 View  Result Date: 12/12/2017 CLINICAL DATA:  Acute onset of cough, shortness of breath, fatigue, fever and chills. EXAM: CHEST  2 VIEW COMPARISON:  Chest radiograph performed 07/03/2017 FINDINGS: The lungs are well-aerated and clear. There is no evidence of focal opacification, pleural effusion or pneumothorax. The heart is borderline normal in size. No acute osseous abnormalities are seen. IMPRESSION: No acute cardiopulmonary process seen. Electronically Signed   By: Garald Balding M.D.   On: 12/12/2017 05:38    Procedures Procedures (including critical care time)  Medications Ordered in ED Medications  albuterol (PROVENTIL) (2.5 MG/3ML) 0.083% nebulizer solution 5 mg (5 mg Nebulization Given 12/12/17 0504)     Initial Impression / Assessment and Plan / ED Course  I have reviewed the triage vital signs and the nursing notes.  Pertinent labs & imaging results that were available during my care of the patient were reviewed by me and considered in my medical decision making (see chart for details).    Nonspecific elevation in troponin.  EKG without ischemic changes.  Patient without chest pain at this time.  Chest x-ray surprisingly clear as clinically it sounds like she may be developing pneumonia.  CT angios chest to evaluate for PE given her tachycardia and exertional shortness of breath.  This will better define her lung parenchyma as well for possible occult pneumonia.  Care transferred to Dr Lita Mains to follow up on CT imaging and admission to hospital   Final Clinical Impressions(s) / ED Diagnoses   Final diagnoses:  None    ED Discharge Orders    None       Jola Schmidt, MD 12/12/17 [phone]

## 2017-12-12 NOTE — H&P (Signed)
Hannah Pitts is an 71 y.o. female.   Chief Complaint: Shortness of breath   HPI: This is a 72 year old female with history of Sjogren's syndrome, SLE on hydroxychloroquine, obesity has been experiencing shortness of breath for the last 5-6 months.  For the last 1 week patient has been having cough with clear productive sputum.  Also has been having subjective fevers.  Patient has multiple family members who have been having cough and shortness of breath.  For the last 2 days patient's shortness of breath significantly worse and was unable to walk to the bathroom.  Considering this came to the emergency department.  Workup in the emergency department with the chest x-ray did not show any infiltrates.  CTA of the chest showed large right pulmonary emboli with right heart strain.  Patient was started on heparin drip.  In the emergency department patient was tachycardic, tachypneic.  ABG showed pH of 7.3, PCO2 of 50, PaO2 of 74.  EKG shows sinus tachycardia.  Patient has mildly elevated troponin of 0.10.  Patient denies having any chest pain.  No previous cardiac history.  Patient denies any smoking however has multiple family members who smoked heavily  Past Medical History:  Diagnosis Date  . Abnormal involuntary movements(781.0)   . Anemia of other chronic disease   . Depressive disorder, not elsewhere classified   . Diaphragmatic hernia without mention of obstruction or gangrene   . Dysthymic disorder   . Generalized hyperhidrosis   . GERD (gastroesophageal reflux disease)   . H/O hiatal hernia   . Lymphoma ([place]) 03/2010   "they got it all w/surgery"  . Meniere's disease of left ear   . Migraine    "used to have classic kind; not anymore; last one was in 2012"  . Other rheumatoid arthritis with visceral or systemic involvement   . Palpitations   . Personal history of other disorders of nervous system and sense organs   . Systemic lupus erythematosus ([place])   . Unspecified disease of  pericardium    Pericarditis   . Unspecified hypothyroidism   . Urticaria, unspecified     Past Surgical History:  Procedure Laterality Date  . ABDOMINAL HYSTERECTOMY  1978  . APPENDECTOMY  1974  . BREAST BIOPSY  03/2010   left breast;   . BREAST LUMPECTOMY  03/2010   left breast; nonHodgkins lymphoma; "not breast cancer"  . CHOLECYSTECTOMY  1974  . ESOPHAGEAL DILATION  ?02/2011  . ESOPHAGOGASTRODUODENOSCOPY  01-24-11   Slight stenosis esoph spasm  . Left VATS  12/1997  . [place]  . shunt     put in behind left ear ; Menier's syndrome    Family History  Problem Relation Age of Onset  . Heart failure Mother   . Stroke Father   . Coronary artery disease Sister   . COPD Sister   . Coronary artery disease Brother   . Diabetes Brother   . Heart failure Brother   . Coronary artery disease Sister   . Coronary artery disease Sister   . Cancer Sister   . Heart failure Sister   . Cancer Sister        lung, esoph  . Cancer Sister        lung  . Breast cancer Maternal Aunt   . Colon cancer Neg Hx    Social History:  reports that  has never smoked. she has never used smokeless tobacco. She reports that she does not drink alcohol  or use drugs.  Allergies: No Known Allergies   (Not in a hospital admission)  Results for orders placed or performed during the hospital encounter of 12/12/17 (from the past 48 hour(s))  CBC     Status: Abnormal   Collection Time: 12/12/17  5:49 AM  Result Value Ref Range   WBC 7.6 4.0 - 10.5 K/uL   RBC 4.66 3.87 - 5.11 MIL/uL   Hemoglobin 14.0 12.0 - 15.0 g/dL   HCT 43.2 36.0 - 46.0 %   MCV 92.7 78.0 - 100.0 fL   MCH 30.0 26.0 - 34.0 pg   MCHC 32.4 30.0 - 36.0 g/dL   RDW 13.4 11.5 - 15.5 %   Platelets 130 (L) 150 - 400 K/uL  Comprehensive metabolic panel     Status: Abnormal   Collection Time: 12/12/17  5:49 AM  Result Value Ref Range   Sodium 142 135 - 145 mmol/L   Potassium 3.1 (L) 3.5 - 5.1 mmol/L   Chloride 104 101 -  111 mmol/L   CO2 25 22 - 32 mmol/L   Glucose, Bld 132 (H) 65 - 99 mg/dL   BUN 19 6 - 20 mg/dL   Creatinine, Ser 1.38 (H) 0.44 - 1.00 mg/dL   Calcium 8.7 (L) 8.9 - 10.3 mg/dL   Total Protein 6.8 6.5 - 8.1 g/dL   Albumin 3.6 3.5 - 5.0 g/dL   AST 39 15 - 41 U/L   ALT 25 14 - 54 U/L   Alkaline Phosphatase 70 38 - 126 U/L   Total Bilirubin 1.1 0.3 - 1.2 mg/dL   GFR calc non Af Amer 37 (L) >60 mL/min   GFR calc Af Amer 43 (L) >60 mL/min    Comment: (NOTE) The eGFR has been calculated using the CKD EPI equation. This calculation has not been validated in all clinical situations. eGFR's persistently <60 mL/min signify possible Chronic Kidney Disease.    Anion gap 13 5 - 15  Troponin I     Status: Abnormal   Collection Time: 12/12/17  5:49 AM  Result Value Ref Range   Troponin I 0.10 (HH) <0.03 ng/mL    Comment: CRITICAL RESULT CALLED TO, READ BACK BY AND VERIFIED WITH: S THORNTON,RN @0717  12/12/17 MKELLY   Blood gas, arterial ([facility] & [facility] ONLY)     Status: Abnormal   Collection Time: 12/12/17  9:55 AM  Result Value Ref Range   O2 Content 2.0 L/min   Delivery systems NASAL CANNULA    pH, Arterial 7.300 (L) 7.350 - 7.450   pCO2 arterial 49.4 (H) 32.0 - 48.0 mmHg   pO2, Arterial 74.5 (L) 83.0 - 108.0 mmHg   Bicarbonate 23.6 20.0 - 28.0 mmol/L   Acid-base deficit 2.7 (H) 0.0 - 2.0 mmol/L   O2 Saturation 93.0 %   Patient temperature 98.6    Collection site RIGHT RADIAL    Drawn by [phone]    Sample type ARTERIAL DRAW    Allens test (pass/fail) PASS PASS   Dg Chest 2 View  Result Date: 12/12/2017 CLINICAL DATA:  Acute onset of cough, shortness of breath, fatigue, fever and chills. EXAM: CHEST  2 VIEW COMPARISON:  Chest radiograph performed 07/03/2017 FINDINGS: The lungs are well-aerated and clear. There is no evidence of focal opacification, pleural effusion or pneumothorax. The heart is borderline normal in size. No acute osseous abnormalities are seen. IMPRESSION: No acute  cardiopulmonary process seen. Electronically Signed   By: Garald Balding M.D.   On: 12/12/2017 05:38  Ct Angio Chest Pe W And/or Wo Contrast  Result Date: 12/12/2017 CLINICAL DATA:  Shortness of Breath EXAM: CT ANGIOGRAPHY CHEST WITH CONTRAST TECHNIQUE: Multidetector CT imaging of the chest was performed using the standard protocol during bolus administration of intravenous contrast. Multiplanar CT image reconstructions and MIPs were obtained to evaluate the vascular anatomy. CONTRAST:  45m [id number] COMPARISON:  Chest x-ray from earlier in the same day. FINDINGS: Cardiovascular: The thoracic aorta demonstrates atherosclerotic calcifications without evidence of dissection. No significant and this aneurysmal dilatation is seen. Pulmonary artery is well visualized and demonstrates multiple filling defects primarily within the left lower lobe consistent with pulmonary emboli. There is elevation of the RV/LV ratio to 1. Mediastinum/Nodes: No hilar or mediastinal adenopathy is noted. The thoracic inlet is within normal limits. The esophagus is unremarkable. Lungs/Pleura: Lungs are well aerated bilaterally with evidence of mild bibasilar atelectasis. No focal confluent infiltrate is seen. No sizable effusion is noted. No pneumothorax is seen. No pulmonary non is noted. Upper Abdomen: Changes consistent with prior cholecystectomy. Mild fatty infiltration of the liver is seen. Mild renal cystic changes are noted. Musculoskeletal: Mild degenerative changes of thoracic spine are seen. Review of the MIP images confirms the above findings. IMPRESSION: Positive for acute PE with CT evidence of right heart strain (RV/LV Ratio = 1) consistent with at least submassive (intermediate risk) PE. The presence of right heart strain has been associated with an increased risk of morbidity and mortality. Please activate Code PE by paging [phone] Critical Value/emergent results were called by telephone at the time of  interpretation on 12/12/2017 at 9:15 am to Dr. YLita Mains who verbally acknowledged these results. Aortic Atherosclerosis (ICD10-I70.0). Electronically Signed   By: MInez CatalinaM.D.   On: 12/12/2017 09:17    Review of Systems  Constitutional: Positive for malaise/fatigue.  Respiratory: Positive for cough, shortness of breath and wheezing.   Cardiovascular: Positive for orthopnea.  Neurological: Positive for weakness.  All other systems reviewed and are negative.   Blood pressure (!) 152/73, pulse (!) 110, temperature 99.8 F (37.7 C), temperature source Oral, resp. rate (!) 25, height 5' 5"  (1.651 m), weight 108.9 kg (240 lb), SpO2 93 %. Physical Exam  Constitutional: She is oriented to person, place, and time. She appears well-developed and well-nourished.  HENT:  Head: Normocephalic and atraumatic.  Hard of hearing  Eyes: Conjunctivae and EOM are normal. Pupils are equal, round, and reactive to light.  Neck: Normal range of motion. Neck supple.  Cardiovascular:  Tachycardia  Respiratory: She is in respiratory distress. She has wheezes. She has rales.  GI: Soft.  Musculoskeletal: Normal range of motion.  Neurological: She is oriented to person, place, and time.  Skin: Skin is warm and dry.     Assessment/Plan ##Pulmonary embolism -Continue with heparin drip -Admit the patient to the stepdown unit -Echocardiogram, venous Dopplers  ##Elevated troponin -Most likely type II MI -Obtain echocardiogram -Continue to cycle cardiac enzymes x3 -Patient continued  to have trend up of the troponin, will need cardiology consult  ##COPD exacerbation -Patient has extensive exposure to smoking at home -Continue with the duo nebs, Solu-Medrol -Keep the patient on Rocephin and Zithromax  ##Congestive heart failure unknown type -Keep the patient on IV Lasix -Echocardiogram  ##Hypoxic respiratory failure acute -Combination of pulmonary embolism, acute bronchitis, congestive heart  failure, obesity hypoventilation syndrome -Continue with oxygen through nasal cannula -If patient continues to have worsening of the shortness of breath will need to repeat ABG and may need to  place the patient on BiPAP  ##Hypokalemia -Replace by mouth -Follow-up with BMP   ##Acute renal failure -We do not have patient's baseline -Treat congestive heart failure and follow-up  ##Severe debility -Involve physical therapy  ##History of lupus -Hold Plaquenil for now  ##Hard of hearing  Job Holtsclaw, MD 12/12/2017, 1:32 PM

## 2017-12-12 NOTE — ED Triage Notes (Signed)
Patient started having cough, sob, fatigue, fever and chills that started on Sunday. Patient states the SOB got worse tonight and then called EMS. Patient has had 10 of albuterol and 0.5 of Atrovent.

## 2017-12-12 NOTE — ED Notes (Signed)
Bed: FF63 Expected date:  Expected time:  Means of arrival:  Comments: EMS 71 yo female from home flu like symptoms x 1 week/SOB-wheezing/duoneb with some relief MP ST 120 O2 sat 94% RA

## 2017-12-12 NOTE — ED Notes (Signed)
Respiratory called regarding patient's ABG order.

## 2017-12-12 NOTE — ED Notes (Signed)
Oxygen applied due to low oxygen saturation at 90-93%. 2 L via nasal cannula. Pt appears to be in no apparent distress, denies any chest pain or SHOB at this time.

## 2017-12-12 NOTE — ED Notes (Signed)
Date and time results received: 12/12/17 0720   Test: Troponin Critical Value: 0.10  Name of Provider Notified: Dr. Venora Maples  Orders Received? Or Actions Taken?: see orders

## 2017-12-12 NOTE — Progress Notes (Signed)
[facility] for Heparin IV Indication: pulmonary embolus  No Known Allergies  Patient Measurements: Height: 5\' 5"  (165.1 cm) Weight: 240 lb (108.9 kg) IBW/kg (Calculated) : 57 Heparin Dosing Weight: 82.5  Vital Signs: Temp: 99.8 F (37.7 C) (12/15 0718) Temp Source: Oral (12/15 0718) BP: 138/72 (12/15 1600) Pulse Rate: 96 (12/15 1600)  Labs: Recent Labs    12/12/17 0549  HGB 14.0  HCT 43.2  PLT 130*  CREATININE 1.38*  TROPONINI 0.10*    Estimated Creatinine Clearance: 45.3 mL/min (A) (by C-G formula based on SCr of 1.38 mg/dL (H)).   Medical History: Past Medical History:  Diagnosis Date  . Abnormal involuntary movements(781.0)   . Anemia of other chronic disease   . Depressive disorder, not elsewhere classified   . Diaphragmatic hernia without mention of obstruction or gangrene   . Dysthymic disorder   . Generalized hyperhidrosis   . GERD (gastroesophageal reflux disease)   . H/O hiatal hernia   . Lymphoma ([place]) 03/2010   "they got it all w/surgery"  . Meniere's disease of left ear   . Migraine    "used to have classic kind; not anymore; last one was in 2012"  . Other rheumatoid arthritis with visceral or systemic involvement   . Palpitations   . Personal history of other disorders of nervous system and sense organs   . Systemic lupus erythematosus ([place])   . Unspecified disease of pericardium    Pericarditis   . Unspecified hypothyroidism   . Urticaria, unspecified     Medications:  Medications Prior to Admission  Medication Sig Dispense Refill Last Dose  . acetaminophen (TYLENOL) 325 MG tablet Take 650 mg by mouth every 4 (four) hours as needed for mild pain.    Past Week at Unknown time  . aspirin (ASPIRIN LOW DOSE) 81 MG EC tablet Take 81 mg by mouth daily.     Past Week at Unknown time  . Cholecalciferol (VITAMIN D) 1000 UNITS capsule Take 1,000 Units by mouth daily.    Past Week at Unknown time  . escitalopram  (LEXAPRO) 10 MG tablet Take 1 tablet (10 mg total) by mouth daily. 90 tablet 3 Past Week at Unknown time  . hydroxychloroquine (PLAQUENIL) 200 MG tablet Take 200 mg by mouth 2 (two) times daily.    Past Week at Unknown time  . levothyroxine (SYNTHROID, LEVOTHROID) 112 MCG tablet Take 1 tablet (112 mcg total) by mouth daily. Except for 1/2 tab on Sun (Patient taking differently: Take 56-112 mcg by mouth daily. Take 1 tablet daily except for 1/2 tablet on Sundays) 90 tablet 3 Past Month at Unknown time  . loratadine (CLARITIN) 10 MG tablet Take 1 tablet (10 mg total) by mouth daily.   Past Week at Unknown time  . metoprolol succinate (TOPROL-XL) 50 MG 24 hr tablet Take 1 tablet (50 mg total) by mouth daily. Take with or immediately following a meal. 90 tablet 3 12/09/2017 at 0900  . predniSONE (DELTASONE) 5 MG tablet Take 5 mg by mouth 2 (two) times daily with a meal.    Past Week at Unknown time  . ranitidine (ZANTAC) 150 MG tablet Take 1 tablet (150 mg total) by mouth 2 (two) times daily. (Patient taking differently: Take 150 mg by mouth daily as needed for heartburn. )   Past Week at Unknown time   Scheduled:  . aspirin EC  81 mg Oral Daily  . chlorhexidine  15 mL Mouth Rinse BID  .  docusate sodium  100 mg Oral BID  . escitalopram  10 mg Oral Daily  . furosemide  40 mg Intravenous BID  . heparin  2,500 Units Intravenous Once  . [START ON 12/13/2017] Influenza vac split quadrivalent PF  0.5 mL Intramuscular Tomorrow-1000  . ipratropium-albuterol  3 mL Nebulization Q6H  . levothyroxine  112 mcg Oral Once per day on Mon Tue Wed Thu Fri Sat  . [START ON 12/13/2017] levothyroxine  56 mcg Oral Q Sun  . mouth rinse  15 mL Mouth Rinse q12n4p  . methylPREDNISolone (SOLU-MEDROL) injection  40 mg Intravenous Q6H  . potassium chloride  40 mEq Oral Daily  . sodium chloride flush  3 mL Intravenous Q12H   Infusions:  . sodium chloride    . azithromycin Stopped (12/12/17 1303)  . cefTRIAXone (ROCEPHIN)   IV Stopped (12/12/17 1133)  . heparin      Assessment: 89 yoF with PMH Sjogren's syndrome, SLE, with SOB x 5-6 months, now with cough and SOB acutely worsening. CXR clear but CTa chest showed large R PE with RH strain. Patient ordered heparin per pharmacy, however signed & held order was not released until 7 hrs later   Baseline INR, aPTT: pending  Prior anticoagulation: none  Significant events:  12/15 heparin started 7 hrs late  Today, 12/12/2017:  CBC: Hgb wnl; Plt slightly low  No bleeding or line issues per nursing  CrCl: 45 ml/min  Goal of Therapy: Heparin level 0.3-0.7 units/ml Monitor platelets by anticoagulation protocol: Yes  Plan:  Heparin 2500 units IV bolus x 1  Heparin 1400 units/hr IV infusion  Check heparin level 8 hrs after start  Daily CBC, daily heparin level once stable  Monitor for signs of bleeding or thrombosis   Reuel Boom, PharmD Pager: [phone] 12/12/2017, 6:00 PM

## 2017-12-12 NOTE — ED Notes (Signed)
Respiratory at bedside.

## 2017-12-13 ENCOUNTER — Inpatient Hospital Stay (HOSPITAL_COMMUNITY): Payer: Medicare Other

## 2017-12-13 ENCOUNTER — Other Ambulatory Visit (HOSPITAL_COMMUNITY): Payer: Medicare Other

## 2017-12-13 DIAGNOSIS — I2699 Other pulmonary embolism without acute cor pulmonale: Principal | ICD-10-CM

## 2017-12-13 DIAGNOSIS — I5030 Unspecified diastolic (congestive) heart failure: Secondary | ICD-10-CM

## 2017-12-13 DIAGNOSIS — J9601 Acute respiratory failure with hypoxia: Secondary | ICD-10-CM

## 2017-12-13 DIAGNOSIS — N183 Chronic kidney disease, stage 3 (moderate): Secondary | ICD-10-CM

## 2017-12-13 LAB — HEPARIN LEVEL (UNFRACTIONATED)
HEPARIN UNFRACTIONATED: 0.33 [IU]/mL (ref 0.30–0.70)
HEPARIN UNFRACTIONATED: 1.18 [IU]/mL — AB (ref 0.30–0.70)

## 2017-12-13 LAB — ECHOCARDIOGRAM COMPLETE
E/e' ratio: 8.72
FS: 42 % (ref 28–44)
Height: 65 in
IVS/LV PW RATIO, ED: 0.87
LA diam end sys: 29 mm
LADIAMINDEX: 1.27 cm/m2
LASIZE: 29 mm
LDCA: 2.97 cm2
LV E/e' medial: 8.72
LV E/e'average: 8.72
LV PW d: 13.6 mm — AB (ref 0.6–1.1)
LV TDI E'LATERAL: 7.01
LV TDI E'MEDIAL: 7.01
LV e' LATERAL: 7.01 cm/s
LVOT VTI: 27.6 cm
LVOT diameter: 19.5 mm
LVOTSV: 82.2 mL
MV pk A vel: 88.2 m/s
MV pk E vel: 61.1 m/s
WEIGHTICAEL: 3840 [oz_av]

## 2017-12-13 LAB — CBC
HEMATOCRIT: 40.5 % (ref 36.0–46.0)
HEMOGLOBIN: 12.9 g/dL (ref 12.0–15.0)
MCH: 29.9 pg (ref 26.0–34.0)
MCHC: 31.9 g/dL (ref 30.0–36.0)
MCV: 93.8 fL (ref 78.0–100.0)
Platelets: 116 10*3/uL — ABNORMAL LOW (ref 150–400)
RBC: 4.32 MIL/uL (ref 3.87–5.11)
RDW: 13.7 % (ref 11.5–15.5)
WBC: 12.3 10*3/uL — ABNORMAL HIGH (ref 4.0–10.5)

## 2017-12-13 LAB — PROTIME-INR
INR: 1.15
PROTHROMBIN TIME: 14.6 s (ref 11.4–15.2)

## 2017-12-13 LAB — APTT: APTT: 62 s — AB (ref 24–36)

## 2017-12-13 LAB — TROPONIN I: TROPONIN I: 0.14 ng/mL — AB (ref <0.03)

## 2017-12-13 MED ORDER — RIVAROXABAN 15 MG PO TABS
15.0000 mg | ORAL_TABLET | Freq: Two times a day (BID) | ORAL | Status: DC
  Administered 2017-12-13: 15 mg via ORAL
  Filled 2017-12-13: qty 1

## 2017-12-13 MED ORDER — HEPARIN (PORCINE) IN NACL 100-0.45 UNIT/ML-% IJ SOLN: 1300.0000 [IU]/h | INTRAMUSCULAR | Status: DC

## 2017-12-13 MED ORDER — RIVAROXABAN (XARELTO) EDUCATION KIT FOR DVT/PE PATIENTS
PACK | Freq: Once | Status: DC
  Filled 2017-12-13: qty 1

## 2017-12-13 MED ORDER — PREDNISONE 5 MG PO TABS
5.0000 mg | ORAL_TABLET | Freq: Two times a day (BID) | ORAL | Status: DC
Start: 2017-12-13 — End: 2017-12-24
  Administered 2017-12-13 – 2017-12-24 (×22): 5 mg via ORAL
  Filled 2017-12-13 (×22): qty 1

## 2017-12-13 MED ORDER — RIVAROXABAN 20 MG PO TABS: 20.0000 mg | ORAL_TABLET | Freq: Every day | ORAL | Status: DC

## 2017-12-13 MED ORDER — MAGNESIUM SULFATE 4 GM/100ML IV SOLN
4.0000 g | Freq: Once | INTRAVENOUS | Status: AC
  Administered 2017-12-13: 4 g via INTRAVENOUS
  Filled 2017-12-13: qty 100

## 2017-12-13 MED ORDER — RIVAROXABAN 20 MG PO TABS
20.0000 mg | ORAL_TABLET | Freq: Every day | ORAL | Status: DC
  Filled 2017-12-13: qty 1

## 2017-12-13 MED ORDER — HYDROXYCHLOROQUINE SULFATE 200 MG PO TABS
200.0000 mg | ORAL_TABLET | Freq: Two times a day (BID) | ORAL | Status: DC
  Administered 2017-12-13 – 2017-12-24 (×23): 200 mg via ORAL
  Filled 2017-12-13 (×23): qty 1

## 2017-12-13 NOTE — Progress Notes (Signed)
PROGRESS NOTE  Hannah Pitts  LGX:[id number] DOB: [birth year] DOA: 12/12/2017 PCP: Tonia Ghent, MD  Brief Narrative:   The patient is a 71 year old female with history of Sjogren's and lupus on hydroxychloroquine, obesity who has had some shortness of breath for the last 5-6 months.  She became acutely more dyspneic over the last 2 days and presented to the emergency department where she was found to have submassive right pulmonary emboli with evidence of right heart strain.  Her initial troponin was minimally elevated.  She was started on a heparin drip and admitted to the stepdown unit.  Lower extremity duplex and echocardiogram have been ordered.  Assessment & Plan:  Acute respiratory failure with hypoxia secondary to acute submassive pulmonary embolism - Continue heparin drip -  D/c aspirin (no hx of CAD or PVD) - Echocardiogram - venous Dopplers: Bilateral lower extremity DVT in the femoral and popliteal veins, unable to discern if these are acute or chronic -  D/c lasix -PT to evaluate the patient tomorrow -Ambulatory pulse ox will need to be obtained prior to discharge.  Elevated troponin is secondary to acute submassive pulmonary embolism, echocardiogram is pending, no further workup at this time.  Possible acute COPD exacerbation, however I think her primary problem is her PE -Discontinue Solu-Medrol and resume her usual dose of prednisone -Discontinue Rocephin  -Plan a 3-day treatment with Zithromax  Hypokalemia, given supplementation, will repeat BMP in a.m. magnesium level was 1.5, will give magnesium sulfate 4 g IV once  CKD stage 3, stable, creatinine at baseline.  Patient does not have acute renal failure, her last creatinine was 1.31 and it was 1.38 on admission  Severe debility - physical therapy  SLE, stable, resume Plaquenil  Hard of hearing  Telemetry: Normal sinus rhythm  DVT prophylaxis: Heparin drip Code Status: full Code Family  Communication: Patient alone, no family at bedside Disposition Plan: Home in a couple of days, after completion of echocardiogram and transitioning to Xarelto.  Case management to assist with Xarelto needs.   Consultants:   None  Procedures:   Echocardiogram  Lower extremity duplex  Antimicrobials:  Anti-infectives (From admission, onward)   Start     Dose/Rate Route Frequency Ordered Stop   12/12/17 1030  cefTRIAXone (ROCEPHIN) 2 g in dextrose 5 % 50 mL IVPB     2 g 100 mL/hr over 30 Minutes Intravenous Daily 12/12/17 1022     12/12/17 1030  azithromycin (ZITHROMAX) 500 mg in dextrose 5 % 250 mL IVPB     500 mg 250 mL/hr over 60 Minutes Intravenous Daily 12/12/17 1022         Subjective:  Patient states that she feels much better.  Her shortness of breath has improved.  She had been having some nausea which has resolved.  Objective: Vitals:   12/13/17 0200 12/13/17 0400 12/13/17 0600 12/13/17 0800  BP: (!) 148/20 (!) 161/63 (!) 165/66 (!) 186/65  Pulse: 73 72 71 67  Resp: 18 16 16 17   Temp:  98.5 F (36.9 C)  98.1 F (36.7 C)  TempSrc:  Oral  Oral  SpO2: 94% 95% 95% 92%  Weight:      Height:        Intake/Output Summary (Last 24 hours) at 12/13/2017 1228 Last data filed at 12/13/2017 0500 Gross per 24 hour  Intake 501.68 ml  Output -  Net 501.68 ml   Filed Weights   12/12/17 0445  Weight: 108.9 kg (240 lb)  Examination:  General exam:  Adult female.  No acute distress.  HEENT:  NCAT, MMM Respiratory system: Scattered rales in the right side, no wheezes or rhonchi Cardiovascular system: Regular rate and rhythm, normal S1/S2. No murmurs, rubs, gallops or clicks.  Warm extremities Gastrointestinal system: Normal active bowel sounds, soft, nondistended, nontender. MSK:  Normal tone and bulk, trace nonpitting left lower extremity edema, no calf tenderness or palpable cords Neuro:  Grossly intact    Data Reviewed: I have personally reviewed following  labs and imaging studies  CBC: Recent Labs  Lab 12/12/17 0549 12/13/17 0344  WBC 7.6 12.3*  HGB 14.0 12.9  HCT 43.2 40.5  MCV 92.7 93.8  PLT 130* 378*   Basic Metabolic Panel: Recent Labs  Lab 12/12/17 0549 12/12/17 1832  NA 142  --   K 3.1*  --   CL 104  --   CO2 25  --   GLUCOSE 132*  --   BUN 19  --   CREATININE 1.38*  --   CALCIUM 8.7*  --   MG  --  1.5*   GFR: Estimated Creatinine Clearance: 45.3 mL/min (A) (by C-G formula based on SCr of 1.38 mg/dL (H)). Liver Function Tests: Recent Labs  Lab 12/12/17 0549  AST 39  ALT 25  ALKPHOS 70  BILITOT 1.1  PROT 6.8  ALBUMIN 3.6   No results for input(s): LIPASE, AMYLASE in the last 168 hours. No results for input(s): AMMONIA in the last 168 hours. Coagulation Profile: Recent Labs  Lab 12/12/17 1832 12/13/17 0344  INR 1.10 1.15   Cardiac Enzymes: Recent Labs  Lab 12/12/17 0549 12/12/17 1832 12/12/17 2245 12/13/17 0344  TROPONINI 0.10* 0.17* 0.19* 0.14*   BNP (last 3 results) No results for input(s): PROBNP in the last 8760 hours. HbA1C: No results for input(s): HGBA1C in the last 72 hours. CBG: No results for input(s): GLUCAP in the last 168 hours. Lipid Profile: No results for input(s): CHOL, HDL, LDLCALC, TRIG, CHOLHDL, LDLDIRECT in the last 72 hours. Thyroid Function Tests: Recent Labs    12/12/17 1832  TSH 1.166   Anemia Panel: No results for input(s): VITAMINB12, FOLATE, FERRITIN, TIBC, IRON, RETICCTPCT in the last 72 hours. Urine analysis:    Component Value Date/Time   COLORURINE yellow 12/27/2010 0801   APPEARANCEUR Clear 12/27/2010 0801   LABSPEC >=1.030 12/27/2010 0801   PHURINE 6.0 12/27/2010 0801   GLUCOSEU NEGATIVE 04/03/2010 1100   HGBUR negative 12/27/2010 0801   BILIRUBINUR Neg 05/20/2013 1010   KETONESUR NEGATIVE 04/03/2010 1100   PROTEINUR 2+ 05/20/2013 1010   PROTEINUR 30 (A) 04/03/2010 1100   UROBILINOGEN negative 05/20/2013 1010   UROBILINOGEN 0.2 12/27/2010  0801   NITRITE Neg 05/20/2013 1010   NITRITE negative 12/27/2010 0801   LEUKOCYTESUR Trace 05/20/2013 1010   Sepsis Labs: @LABRCNTIP (procalcitonin:4,lacticidven:4)  ) Recent Results (from the past 240 hour(s))  MRSA PCR Screening     Status: None   Collection Time: 12/12/17  5:14 PM  Result Value Ref Range Status   MRSA by PCR NEGATIVE NEGATIVE Final    Comment:        The GeneXpert MRSA Assay (FDA approved for NASAL specimens only), is one component of a comprehensive MRSA colonization surveillance program. It is not intended to diagnose MRSA infection nor to guide or monitor treatment for MRSA infections.       Radiology Studies: Dg Chest 2 View  Result Date: 12/12/2017 CLINICAL DATA:  Acute onset of cough, shortness of breath,  fatigue, fever and chills. EXAM: CHEST  2 VIEW COMPARISON:  Chest radiograph performed 07/03/2017 FINDINGS: The lungs are well-aerated and clear. There is no evidence of focal opacification, pleural effusion or pneumothorax. The heart is borderline normal in size. No acute osseous abnormalities are seen. IMPRESSION: No acute cardiopulmonary process seen. Electronically Signed   By: Garald Balding M.D.   On: 12/12/2017 05:38   Ct Angio Chest Pe W And/or Wo Contrast  Result Date: 12/12/2017 CLINICAL DATA:  Shortness of Breath EXAM: CT ANGIOGRAPHY CHEST WITH CONTRAST TECHNIQUE: Multidetector CT imaging of the chest was performed using the standard protocol during bolus administration of intravenous contrast. Multiplanar CT image reconstructions and MIPs were obtained to evaluate the vascular anatomy. CONTRAST:  69mL [id number] COMPARISON:  Chest x-ray from earlier in the same day. FINDINGS: Cardiovascular: The thoracic aorta demonstrates atherosclerotic calcifications without evidence of dissection. No significant and this aneurysmal dilatation is seen. Pulmonary artery is well visualized and demonstrates multiple filling defects primarily within the left  lower lobe consistent with pulmonary emboli. There is elevation of the RV/LV ratio to 1. Mediastinum/Nodes: No hilar or mediastinal adenopathy is noted. The thoracic inlet is within normal limits. The esophagus is unremarkable. Lungs/Pleura: Lungs are well aerated bilaterally with evidence of mild bibasilar atelectasis. No focal confluent infiltrate is seen. No sizable effusion is noted. No pneumothorax is seen. No pulmonary non is noted. Upper Abdomen: Changes consistent with prior cholecystectomy. Mild fatty infiltration of the liver is seen. Mild renal cystic changes are noted. Musculoskeletal: Mild degenerative changes of thoracic spine are seen. Review of the MIP images confirms the above findings. IMPRESSION: Positive for acute PE with CT evidence of right heart strain (RV/LV Ratio = 1) consistent with at least submassive (intermediate risk) PE. The presence of right heart strain has been associated with an increased risk of morbidity and mortality. Please activate Code PE by paging [phone]. Critical Value/emergent results were called by telephone at the time of interpretation on 12/12/2017 at 9:15 am to Dr. Lita Mains, who verbally acknowledged these results. Aortic Atherosclerosis (ICD10-I70.0). Electronically Signed   By: Inez Catalina M.D.   On: 12/12/2017 09:17     Scheduled Meds: . aspirin EC  81 mg Oral Daily  . chlorhexidine  15 mL Mouth Rinse BID  . docusate sodium  100 mg Oral BID  . escitalopram  10 mg Oral Daily  . furosemide  40 mg Intravenous BID  . ipratropium-albuterol  3 mL Nebulization Q6H  . levothyroxine  112 mcg Oral Once per day on Mon Tue Wed Thu Fri Sat  . levothyroxine  56 mcg Oral Q Sun  . mouth rinse  15 mL Mouth Rinse q12n4p  . potassium chloride  40 mEq Oral Daily  . predniSONE  5 mg Oral BID WC  . sodium chloride flush  3 mL Intravenous Q12H   Continuous Infusions: . sodium chloride 250 mL (12/13/17 0500)  . azithromycin Stopped (12/12/17 1303)  .  cefTRIAXone (ROCEPHIN)  IV Stopped (12/13/17 0941)  . heparin 1,500 Units/hr (12/13/17 1219)     LOS: 1 day    Time spent: 30 min    Janece Canterbury, MD [facility] Pager [phone]  If 7PM-7AM, please contact night-coverage [email or web address] Password [facility] 12/13/2017, 12:28 PM

## 2017-12-13 NOTE — Progress Notes (Signed)
ANTICOAGULATION CONSULT NOTE  Pharmacy Consult for Heparin IV Indication: pulmonary embolus  No Known Allergies  Patient Measurements: Height: 5\' 5"  (165.1 cm) Weight: 240 lb (108.9 kg) IBW/kg (Calculated) : 57 Heparin Dosing Weight: 82.5  Vital Signs: Temp: 98.1 F (36.7 C) (12/16 0800) Temp Source: Oral (12/16 0800) BP: 114/75 (12/16 1230) Pulse Rate: 78 (12/16 1230)  Labs: Recent Labs    12/12/17 0549 12/12/17 1832 12/12/17 2245 12/13/17 0158 12/13/17 0344 12/13/17 1152  HGB 14.0  --   --   --  12.9  --   HCT 43.2  --   --   --  40.5  --   PLT 130*  --   --   --  116*  --   APTT  --  24  --   --  62*  --   LABPROT  --  14.1  --   --  14.6  --   INR  --  1.10  --   --  1.15  --   HEPARINUNFRC  --   --   --  0.33  --  1.18*  CREATININE 1.38*  --   --   --   --   --   TROPONINI 0.10* 0.17* 0.19*  --  0.14*  --     Estimated Creatinine Clearance: 45.3 mL/min (A) (by C-G formula based on SCr of 1.38 mg/dL (H)).   Infusions:  . sodium chloride 250 mL (12/13/17 0500)  . azithromycin Stopped (12/12/17 1303)  . heparin 1,500 Units/hr (12/13/17 1219)    Assessment: 56 yoF with PMH Sjogren's syndrome, SLE, with SOB x 5-6 months, now with cough and SOB acutely worsening. CXR clear but CTa chest showed large R PE with RH strain. Patient ordered heparin per pharmacy, however signed & held order was not released until 7 hrs later   Baseline INR, aPTT: aPTT = 24sec,INR = 1.10  Prior anticoagulation: none  Significant events:  12/15 heparin started 7 hrs late  Today, 12/13/2017   Heparin level SUPRAtherapeutic at 1.18 on heparin at 1500 units/hr  CBC: Hgb wnl; Plt slightly low - trending down  No issues per nursing, infusing at expected rate  Goal of Therapy: Heparin level 0.3-0.7 units/ml Monitor platelets by anticoagulation protocol: Yes  Plan:  Hold heparin x 1 hr then decrease heparin drip to 1300 units/hr  Recheck HL in 8 hours  Daily CBC, daily  heparin level once stable  Monitor for signs of bleeding or thrombosis  Doreene Eland, PharmD, BCPS.   Pager: [phone] 12/13/2017 1:11 PM

## 2017-12-13 NOTE — Progress Notes (Signed)
  Echocardiogram 2D Echocardiogram has been performed.  Jennette Dubin 12/13/2017, 3:08 PM

## 2017-12-13 NOTE — Progress Notes (Signed)
PT Cancellation Note  Patient Details Name: KENIESHA ADDERLY MRN: [id number] DOB: 1946/05/15   Cancelled Treatment:    Reason Eval/Treat Not Completed: Patient not medically ready, Per MD note, PT will evaluate 12/17. On heparin for PE.    Claretha Cooper 12/13/2017, 2:18 PM Tresa Endo PT [phone]

## 2017-12-13 NOTE — Progress Notes (Signed)
LE venous duplex prelim: age- indeterminate DVT noted in bilateral femoral and popliteal veins, and the left posterior tibial veins. Landry Mellow, RDMS, RVT  Gave results to patient's nurse.

## 2017-12-13 NOTE — Evaluation (Signed)
Clinical/Bedside Swallow Evaluation Patient Details  Name: Hannah Pitts MRN: [id number] Date of Birth: 1946-09-17  Today's Date: 12/13/2017 Time: SLP Start Time (ACUTE ONLY): 1728 SLP Stop Time (ACUTE ONLY): 1745 SLP Time Calculation (min) (ACUTE ONLY): 17 min  Past Medical History:  Past Medical History:  Diagnosis Date  . Abnormal involuntary movements(781.0)   . Anemia of other chronic disease   . Depressive disorder, not elsewhere classified   . Diaphragmatic hernia without mention of obstruction or gangrene   . Dysthymic disorder   . Generalized hyperhidrosis   . GERD (gastroesophageal reflux disease)   . H/O hiatal hernia   . Lymphoma ([facility]) 03/2010   "they got it all w/surgery"  . Meniere's disease of left ear   . Migraine    "used to have classic kind; not anymore; last one was in 2012"  . Other rheumatoid arthritis with visceral or systemic involvement   . Palpitations   . Personal history of other disorders of nervous system and sense organs   . Systemic lupus erythematosus ([facility])   . Unspecified disease of pericardium    Pericarditis   . Unspecified hypothyroidism   . Urticaria, unspecified    Past Surgical History:  Past Surgical History:  Procedure Laterality Date  . ABDOMINAL HYSTERECTOMY  1978  . APPENDECTOMY  1974  . BREAST BIOPSY  03/2010   left breast;   . BREAST LUMPECTOMY  03/2010   left breast; nonHodgkins lymphoma; "not breast cancer"  . CHOLECYSTECTOMY  1974  . ESOPHAGEAL DILATION  ?02/2011  . ESOPHAGOGASTRODUODENOSCOPY  01-24-11   Slight stenosis esoph spasm  . Left VATS  12/1997  . LUMBAR DISC SURGERY  1977  . shunt     put in behind left ear ; Menier's syndrome   HPI:  The patient is a 71 year old female with history of COPD, Sjogren's and lupus on hydroxychloroquine, obesity who has had some shortness of breath for the last 5-6 months. Admitted for acute respiratory failure with hypoxia secondary to acute submassive pulmonary embolism with  evidence of right heart strain. CXR with no acute process. Pt has history of esophageal stricture s/p savary dilatation 01/24/11. Barium swallow 01/21/11 revealed nonobstructing esophageal web at C5, distal esophageal stricture.    Assessment / Plan / Recommendation Clinical Impression  Patient's oropharyngeal swallow appears grossly functional at bedside with adequate airway protection. She does have chronic xerostomia 2/2 Sjogren's, compensates by moistening solids with liquids. Delayed coughing after larger volumes of liquid and regular solids with reported globus sensation. She reports greater difficulty with solids vs liquids, endorses frequent coughing after meals and occasional regurgitation of solids (particularly meat). States she had an appointment with GI re: these symptoms but had to cancel due to family circumstances. RN reported difficulty with larger pills; pt stated in the past she has found relief by breaking larger pills into pieces and taking with applesauce. Recommend she continue current diet, small pills whole with liquid or in puree, break larger pills in puree, esophageal precautions. Will follow up briefly for education, diet tolerance. Consider esophageal w/u, possibly as an outpatient.  SLP Visit Diagnosis: Dysphagia, unspecified (R13.10)    Aspiration Risk  Mild aspiration risk    Diet Recommendation Regular;Thin liquid   Liquid Administration via: Cup;Straw Medication Administration: Whole meds with puree(break larger pills into pieces) Supervision: Patient able to self feed Compensations: Slow rate;Small sips/bites;Follow solids with liquid Postural Changes: Seated upright at 90 degrees;Remain upright for at least 30 minutes after po intake  Other  Recommendations Recommended Consults: Consider esophageal assessment Oral Care Recommendations: Oral care BID   Follow up Recommendations None      Frequency and Duration min 1 x/week  1 week       Prognosis  Prognosis for Safe Diet Advancement: Good      Swallow Study   General Date of Onset: 12/12/17 HPI: The patient is a 71 year old female with history of COPD, Sjogren's and lupus on hydroxychloroquine, obesity who has had some shortness of breath for the last 5-6 months. Admitted for acute respiratory failure with hypoxia secondary to acute submassive pulmonary embolism with evidence of right heart strain. CXR with no acute process. Pt has history of esophageal stricture s/p savary dilatation 01/24/11. Barium swallow 01/21/11 revealed nonobstructing esophageal web at C5, distal esophageal stricture.  Type of Study: Bedside Swallow Evaluation Previous Swallow Assessment: see HPI Diet Prior to this Study: Regular;Thin liquids Temperature Spikes Noted: Yes(99.8) Respiratory Status: Nasal cannula History of Recent Intubation: No Behavior/Cognition: Alert;Pleasant mood;Cooperative Oral Cavity Assessment: Dry(pt has Sjogren's) Oral Care Completed by SLP: No Oral Cavity - Dentition: Dentures, top;Dentures, bottom Vision: Functional for self-feeding Self-Feeding Abilities: Able to feed self Patient Positioning: Upright in bed Baseline Vocal Quality: Hoarse Volitional Cough: Strong Volitional Swallow: Able to elicit    Oral/Motor/Sensory Function Overall Oral Motor/Sensory Function: Within functional limits   Ice Chips Ice chips: Not tested   Thin Liquid Thin Liquid: Within functional limits Other Comments: delayed coughing after multiple swallows, suspect due to primary esophageal dysphagia vs aspiration    Nectar Thick Nectar Thick Liquid: Not tested   Honey Thick Honey Thick Liquid: Not tested   Puree Puree: Within functional limits   Solid   GO   Solid: Impaired Oral Phase Impairments: Impaired mastication Oral Phase Functional Implications: Prolonged oral transit;Impaired mastication Other Comments: xerostomia; pt uses liquid to moisten solid       Rondel Baton, MS,  CCC-SLP Speech-Language Pathologist  Arlana Lindau 12/13/2017,6:04 PM

## 2017-12-13 NOTE — Progress Notes (Signed)
Pt had tough time swallowing morning medications.  Informed Dr. Sheran Fava for a swallow evaluation.  Irven Baltimore, RN

## 2017-12-13 NOTE — Progress Notes (Signed)
[facility] for Heparin IV >> Xarelto Indication: pulmonary embolus  No Known Allergies  Patient Measurements: Height: 5\' 5"  (165.1 cm) Weight: 240 lb (108.9 kg) IBW/kg (Calculated) : 57  Vital Signs: Temp: 98.5 F (36.9 C) (12/16 1230) Temp Source: Oral (12/16 1230) BP: 174/70 (12/16 1719) Pulse Rate: 93 (12/16 1719)  Labs: Recent Labs    12/12/17 0549 12/12/17 1832 12/12/17 2245 12/13/17 0158 12/13/17 0344 12/13/17 1152  HGB 14.0  --   --   --  12.9  --   HCT 43.2  --   --   --  40.5  --   PLT 130*  --   --   --  116*  --   APTT  --  24  --   --  62*  --   LABPROT  --  14.1  --   --  14.6  --   INR  --  1.10  --   --  1.15  --   HEPARINUNFRC  --   --   --  0.33  --  1.18*  CREATININE 1.38*  --   --   --   --   --   TROPONINI 0.10* 0.17* 0.19*  --  0.14*  --     Estimated Creatinine Clearance: 45.3 mL/min (A) (by C-G formula based on SCr of 1.38 mg/dL (H)).   Infusions:  . sodium chloride 250 mL (12/13/17 0500)  . azithromycin Stopped (12/13/17 1556)    Assessment: 63 yoF with PMH Sjogren's syndrome, SLE, with SOB x 5-6 months, now with cough and SOB acutely worsening. CXR clear but CTa chest showed large R PE with RH strain. Patient ordered heparin per pharmacy, however signed & held order was not released until 7 hrs later   Baseline INR, aPTT: aPTT = 24sec,INR = 1.10  Prior anticoagulation: none  Significant events:  12/15 heparin started 7 hrs late 12/16 heparin held 1 hr for elevated level 12/16 non-severe bleeding noted at IV site; still continues at the time of this note writing, but has improved from earlier 12/16 PM - Switch to Xarelto  Today, 12/13/2017   Most recentHeparin level SUPRAtherapeutic at 1.18 on heparin at 1500 units/hr  CBC: Hgb wnl; Plt slightly low - trending down  Some bleeding as noted above, no infusion issues  Goal of Therapy: Prevent PE worsening or recurrence  Plan:  Stop  heparin  Begin Xarelto 15 mg PO bid with meals x 21 days, followed by 20 mg PO daily with a meal thereafter  Monitor for signs of bleeding or worsened thrombosis  Pharmacy to provide education prior to discharge   Reuel Boom, PharmD, BCPS Pager: [phone] 12/13/2017, 7:23 PM

## 2017-12-13 NOTE — Progress Notes (Signed)
ANTICOAGULATION CONSULT NOTE  Pharmacy Consult for Heparin IV Indication: pulmonary embolus  No Known Allergies  Patient Measurements: Height: 5\' 5"  (165.1 cm) Weight: 240 lb (108.9 kg) IBW/kg (Calculated) : 57 Heparin Dosing Weight: 82.5  Vital Signs: Temp: 98.2 F (36.8 C) (12/16 0000) Temp Source: Axillary (12/16 0000) BP: 153/68 (12/16 0000) Pulse Rate: 78 (12/16 0000)  Labs: Recent Labs    12/12/17 0549 12/12/17 1832 12/12/17 2245 12/13/17 0158  HGB 14.0  --   --   --   HCT 43.2  --   --   --   PLT 130*  --   --   --   APTT  --  24  --   --   LABPROT  --  14.1  --   --   INR  --  1.10  --   --   HEPARINUNFRC  --   --   --  0.33  CREATININE 1.38*  --   --   --   TROPONINI 0.10* 0.17* 0.19*  --     Estimated Creatinine Clearance: 45.3 mL/min (A) (by C-G formula based on SCr of 1.38 mg/dL (H)).   Medical History: Past Medical History:  Diagnosis Date  . Abnormal involuntary movements(781.0)   . Anemia of other chronic disease   . Depressive disorder, not elsewhere classified   . Diaphragmatic hernia without mention of obstruction or gangrene   . Dysthymic disorder   . Generalized hyperhidrosis   . GERD (gastroesophageal reflux disease)   . H/O hiatal hernia   . Lymphoma ([place]) 03/2010   "they got it all w/surgery"  . Meniere's disease of left ear   . Migraine    "used to have classic kind; not anymore; last one was in 2012"  . Other rheumatoid arthritis with visceral or systemic involvement   . Palpitations   . Personal history of other disorders of nervous system and sense organs   . Systemic lupus erythematosus ([place])   . Unspecified disease of pericardium    Pericarditis   . Unspecified hypothyroidism   . Urticaria, unspecified     Medications:  Medications Prior to Admission  Medication Sig Dispense Refill Last Dose  . acetaminophen (TYLENOL) 325 MG tablet Take 650 mg by mouth every 4 (four) hours as needed for mild pain.    Past Week at  Unknown time  . aspirin (ASPIRIN LOW DOSE) 81 MG EC tablet Take 81 mg by mouth daily.     Past Week at Unknown time  . Cholecalciferol (VITAMIN D) 1000 UNITS capsule Take 1,000 Units by mouth daily.    Past Week at Unknown time  . escitalopram (LEXAPRO) 10 MG tablet Take 1 tablet (10 mg total) by mouth daily. 90 tablet 3 Past Week at Unknown time  . hydroxychloroquine (PLAQUENIL) 200 MG tablet Take 200 mg by mouth 2 (two) times daily.    Past Week at Unknown time  . levothyroxine (SYNTHROID, LEVOTHROID) 112 MCG tablet Take 1 tablet (112 mcg total) by mouth daily. Except for 1/2 tab on Sun (Patient taking differently: Take 56-112 mcg by mouth daily. Take 1 tablet daily except for 1/2 tablet on Sundays) 90 tablet 3 Past Month at Unknown time  . loratadine (CLARITIN) 10 MG tablet Take 1 tablet (10 mg total) by mouth daily.   Past Week at Unknown time  . metoprolol succinate (TOPROL-XL) 50 MG 24 hr tablet Take 1 tablet (50 mg total) by mouth daily. Take with or immediately following a meal.  90 tablet 3 12/09/2017 at 0900  . predniSONE (DELTASONE) 5 MG tablet Take 5 mg by mouth 2 (two) times daily with a meal.    Past Week at Unknown time  . ranitidine (ZANTAC) 150 MG tablet Take 1 tablet (150 mg total) by mouth 2 (two) times daily. (Patient taking differently: Take 150 mg by mouth daily as needed for heartburn. )   Past Week at Unknown time   Scheduled:  . aspirin EC  81 mg Oral Daily  . chlorhexidine  15 mL Mouth Rinse BID  . docusate sodium  100 mg Oral BID  . escitalopram  10 mg Oral Daily  . furosemide  40 mg Intravenous BID  . Influenza vac split quadrivalent PF  0.5 mL Intramuscular Tomorrow-1000  . ipratropium-albuterol  3 mL Nebulization Q6H  . levothyroxine  112 mcg Oral Once per day on Mon Tue Wed Thu Fri Sat  . levothyroxine  56 mcg Oral Q Sun  . mouth rinse  15 mL Mouth Rinse q12n4p  . methylPREDNISolone (SOLU-MEDROL) injection  40 mg Intravenous Q6H  . potassium chloride  40 mEq Oral  Daily  . sodium chloride flush  3 mL Intravenous Q12H   Infusions:  . sodium chloride 250 mL (12/13/17 0000)  . azithromycin Stopped (12/12/17 1303)  . cefTRIAXone (ROCEPHIN)  IV Stopped (12/12/17 1133)  . heparin 1,400 Units/hr (12/13/17 0000)    Assessment: 33 yoF with PMH Sjogren's syndrome, SLE, with SOB x 5-6 months, now with cough and SOB acutely worsening. CXR clear but CTa chest showed large R PE with RH strain. Patient ordered heparin per pharmacy, however signed & held order was not released until 7 hrs later   Baseline INR, aPTT: pending  Prior anticoagulation: none  Significant events:  12/15 heparin started 7 hrs late  12/15  CBC: Hgb wnl; Plt slightly low  No bleeding or line issues per nursing  CrCl: 45 ml/min Today, 12/16  0158 HL= 0.33 at goal, no bleeding, infusion paused briefly earlier per RN.   Goal of Therapy: Heparin level 0.3-0.7 units/ml Monitor platelets by anticoagulation protocol: Yes  Plan:  Increase heparin drip to 1500 units/hr  Recheck HL in 8 hours  Daily CBC, daily heparin level once stable  Monitor for signs of bleeding or thrombosis  Dorrene German 12/13/2017, 3:24 AM

## 2017-12-14 ENCOUNTER — Inpatient Hospital Stay (HOSPITAL_COMMUNITY): Payer: Medicare Other

## 2017-12-14 DIAGNOSIS — N179 Acute kidney failure, unspecified: Secondary | ICD-10-CM

## 2017-12-14 LAB — BASIC METABOLIC PANEL
ANION GAP: 13 (ref 5–15)
BUN: 42 mg/dL — AB (ref 6–20)
CALCIUM: 8.7 mg/dL — AB (ref 8.9–10.3)
CO2: 25 mmol/L (ref 22–32)
Chloride: 101 mmol/L (ref 101–111)
Creatinine, Ser: 2.35 mg/dL — ABNORMAL HIGH (ref 0.44–1.00)
GFR calc Af Amer: 23 mL/min — ABNORMAL LOW (ref >60)
GFR calc non Af Amer: 20 mL/min — ABNORMAL LOW (ref >60)
GLUCOSE: 147 mg/dL — AB (ref 65–99)
POTASSIUM: 3.6 mmol/L (ref 3.5–5.1)
Sodium: 139 mmol/L (ref 135–145)

## 2017-12-14 LAB — HEPARIN LEVEL (UNFRACTIONATED)
HEPARIN UNFRACTIONATED: 1.28 [IU]/mL — AB (ref 0.30–0.70)
Heparin Unfractionated: >2.2 IU/mL — ABNORMAL HIGH (ref 0.30–0.70)

## 2017-12-14 LAB — APTT
APTT: 50 s — AB (ref 24–36)
aPTT: 24 seconds (ref 24–36)

## 2017-12-14 LAB — SODIUM, URINE, RANDOM: Sodium, Ur: 68 mmol/L

## 2017-12-14 LAB — URINALYSIS, ROUTINE W REFLEX MICROSCOPIC
BILIRUBIN URINE: NEGATIVE
Glucose, UA: NEGATIVE mg/dL
HGB URINE DIPSTICK: NEGATIVE
Ketones, ur: NEGATIVE mg/dL
Leukocytes, UA: NEGATIVE
Nitrite: NEGATIVE
PH: 5 (ref 5.0–8.0)
Protein, ur: NEGATIVE mg/dL
SPECIFIC GRAVITY, URINE: 1.014 (ref 1.005–1.030)

## 2017-12-14 LAB — CBC
HEMATOCRIT: 37.7 % (ref 36.0–46.0)
HEMOGLOBIN: 12.2 g/dL (ref 12.0–15.0)
MCH: 30.3 pg (ref 26.0–34.0)
MCHC: 32.4 g/dL (ref 30.0–36.0)
MCV: 93.5 fL (ref 78.0–100.0)
Platelets: 180 10*3/uL (ref 150–400)
RBC: 4.03 MIL/uL (ref 3.87–5.11)
RDW: 13.8 % (ref 11.5–15.5)
WBC: 21.2 10*3/uL — ABNORMAL HIGH (ref 4.0–10.5)

## 2017-12-14 LAB — CREATININE, URINE, RANDOM: Creatinine, Urine: 100.85 mg/dL

## 2017-12-14 MED ORDER — SODIUM CHLORIDE 0.9 % IV SOLN: 250.0000 mL | INTRAVENOUS | Status: DC | PRN

## 2017-12-14 MED ORDER — METOPROLOL TARTRATE 25 MG PO TABS
12.5000 mg | ORAL_TABLET | Freq: Two times a day (BID) | ORAL | Status: DC
  Administered 2017-12-14 – 2017-12-24 (×21): 12.5 mg via ORAL
  Filled 2017-12-14 (×22): qty 1

## 2017-12-14 MED ORDER — HEPARIN (PORCINE) IN NACL 100-0.45 UNIT/ML-% IJ SOLN: 1300.0000 [IU]/h | INTRAMUSCULAR | Status: DC

## 2017-12-14 MED ORDER — HEPARIN (PORCINE) IN NACL 100-0.45 UNIT/ML-% IJ SOLN
1450.0000 [IU]/h | INTRAMUSCULAR | Status: DC
  Administered 2017-12-14: 1450 [IU]/h via INTRAVENOUS
  Filled 2017-12-14: qty 250

## 2017-12-14 MED ORDER — IPRATROPIUM-ALBUTEROL 0.5-2.5 (3) MG/3ML IN SOLN
3.0000 mL | Freq: Three times a day (TID) | RESPIRATORY_TRACT | Status: DC
  Administered 2017-12-15 – 2017-12-19 (×12): 3 mL via RESPIRATORY_TRACT
  Filled 2017-12-14 (×14): qty 3

## 2017-12-14 MED ORDER — ALBUTEROL SULFATE (2.5 MG/3ML) 0.083% IN NEBU
2.5000 mg | INHALATION_SOLUTION | RESPIRATORY_TRACT | Status: DC | PRN
  Administered 2017-12-16: 2.5 mg via RESPIRATORY_TRACT
  Filled 2017-12-14: qty 3

## 2017-12-14 MED ORDER — HEPARIN BOLUS VIA INFUSION
1250.0000 [IU] | Freq: Once | INTRAVENOUS | Status: AC
  Administered 2017-12-14: 1250 [IU] via INTRAVENOUS
  Filled 2017-12-14: qty 1250

## 2017-12-14 NOTE — Progress Notes (Signed)
ANTICOAGULATION CONSULT NOTE  Pharmacy Consult for Heparin IV Indication: pulmonary embolus and DVT  No Known Allergies  Patient Measurements: Height: 5\' 5"  (165.1 cm) Weight: 240 lb (108.9 kg) IBW/kg (Calculated) : 57  Heparin dosing weight 82.5 kg  Vital Signs: Temp: 98.6 F (37 C) (12/17 0400) Temp Source: Oral (12/17 0400) BP: 153/106 (12/17 0600) Pulse Rate: 100 (12/17 0600)  Labs: Recent Labs    12/12/17 0549 12/12/17 1832 12/12/17 2245 12/13/17 0158 12/13/17 0344 12/13/17 1152 12/14/17 0329  HGB 14.0  --   --   --  12.9  --  12.2  HCT 43.2  --   --   --  40.5  --  37.7  PLT 130*  --   --   --  116*  --  180  APTT  --  24  --   --  62*  --   --   LABPROT  --  14.1  --   --  14.6  --   --   INR  --  1.10  --   --  1.15  --   --   HEPARINUNFRC  --   --   --  0.33  --  1.18*  --   CREATININE 1.38*  --   --   --   --   --  2.35*  TROPONINI 0.10* 0.17* 0.19*  --  0.14*  --   --     Estimated Creatinine Clearance: 26.6 mL/min (A) (by C-G formula based on SCr of 2.35 mg/dL (H)).   Infusions:  . sodium chloride 250 mL (12/13/17 0500)  . azithromycin Stopped (12/13/17 1556)    Assessment: 52 yoF with PMH Sjogren's syndrome, SLE, with SOB x 5-6 months, now with cough and SOB acutely worsening. CXR clear but CTa chest showed large R PE with RH strain. Heparin per pharmacy on 12/15, however signed & held order was not released until 7 hrs later.  No prior anticoagulation.  Significant events:  12/16 heparin held 1 hr for elevated level 12/16 non-severe bleeding noted at IV site; still continues at the time of this note writing, but has improved from earlier 12/16 PM - Switch from Heparin to Xarelto.  First dose given ~22:00 12/17 Transition back to Heparin d/t AKI  Today, 12/14/2017   Most recent Heparin level SUPRAtherapeutic at 1.18 on heparin at 1500 units/hr on 12/16 at ~12:00, prior to changing to Xarelto.  CBC: Hgb decreased but remains WNL; Plt  improved  Some bleeding at IV site (noted 12/16), no further bleeding reported 12/17, no infusion issues  SCr significantly increased 1.3 > 2.35, with CrCl ~ 26 ml/min  12 hrs after first Xarelto dose, APTT 24, Heparin level is elevated >2.2 as expected.  Goal of Therapy: Heparin level 0.3-0.7 units/ml APTT 66-102 Monitor platelets by anticoagulation protocol: Yes  Plan: No heparin bolus Start heparin IV infusion at 1300 units/hr Heparin level, APTT 8 hours after starting Heparin level artificially elevated d/t recent Xarelto.  Monitor and dose adjust using APTT until correlation with heparin level. Daily heparin level and CBC  Continue to monitor H&H and platelets   Gretta Arab PharmD, BCPS Pager [phone] 12/14/2017 7:01 AM

## 2017-12-14 NOTE — Progress Notes (Signed)
SLP Cancellation Note  Patient Details Name: Hannah Pitts MRN: [id number] DOB: 1946-09-08   Cancelled treatment:       Reason Eval/Treat Not Completed: Patient at procedure or test/unavailable - currently with nursing. Will continue efforts. Continue recommendation per BSE for GI consult for possible esophageal dysmotility.  Laymond Postle B. Quentin Ore [facility], CCC-SLP Speech Language Pathologist [phone]  Shonna Chock 12/14/2017, 11:04 AM

## 2017-12-14 NOTE — Progress Notes (Addendum)
ANTICOAGULATION CONSULT NOTE  Pharmacy Consult for Heparin IV Indication: pulmonary embolus and DVT  No Known Allergies  Patient Measurements: Height: 5\' 5"  (165.1 cm) Weight: 240 lb (108.9 kg) IBW/kg (Calculated) : 57  Heparin dosing weight 82.5 kg  Vital Signs: Temp: 98.6 F (37 C) (12/17 1600) Temp Source: Oral (12/17 1600) BP: 115/49 (12/17 1800) Pulse Rate: 88 (12/17 1800)  Labs: Recent Labs    12/12/17 0549  12/12/17 1832 12/12/17 2245  12/13/17 0344 12/13/17 1152 12/14/17 0329 12/14/17 0859 12/14/17 1845  HGB 14.0  --   --   --   --  12.9  --  12.2  --   --   HCT 43.2  --   --   --   --  40.5  --  37.7  --   --   PLT 130*  --   --   --   --  116*  --  180  --   --   APTT  --    < > 24  --   --  62*  --   --  24 50*  LABPROT  --   --  14.1  --   --  14.6  --   --   --   --   INR  --   --  1.10  --   --  1.15  --   --   --   --   HEPARINUNFRC  --   --   --   --    < >  --  1.18*  --  >2.20* 1.28*  CREATININE 1.38*  --   --   --   --   --   --  2.35*  --   --   TROPONINI 0.10*  --  0.17* 0.19*  --  0.14*  --   --   --   --    < > = values in this interval not displayed.    Estimated Creatinine Clearance: 26.6 mL/min (A) (by C-G formula based on SCr of 2.35 mg/dL (H)).   Infusions:  . sodium chloride    . heparin      Assessment: 23 yoF with PMH Sjogren's syndrome, SLE, with SOB x 5-6 months, now with cough and SOB acutely worsening. CXR clear but CTa chest showed large R PE with RH strain. Heparin per pharmacy on 12/15, however signed & held order was not released until 7 hrs later.  No prior anticoagulation.  Significant events:  12/16 heparin held 1 hr for elevated level 12/16 non-severe bleeding noted at IV site; still continues at the time of this note writing, but has improved from earlier 12/16 PM - Switch from Heparin to Xarelto.  First dose given ~22:00 12/17 Transition back to Heparin d/t AKI  Today, 12/14/2017   CBC: Hgb decreased but  remains WNL; Pltc improved to WNL  Some bleeding at IV site (noted 12/16), no further bleeding reported 12/17 per nursing, no infusion issues  SCr significantly increased 1.3 > 2.35, with CrCl ~ 26 ml/min  12 hrs after first Xarelto dose, APTT 24, Heparin level is elevated >2.2 as expected.  APTT at 1845 = 50 seconds, subtherapeutic. Heparin level elevated but decreased to 1.28 (still seeing effects of Xarelto on heparin level).   Goal of Therapy: Heparin level 0.3-0.7 units/ml APTT 66-102 seconds Monitor platelets by anticoagulation protocol: Yes  Plan: Heparin bolus 1250 units IV x 1, then increase IV heparin infusion to 1450  units/hr Heparin level, aPTT 8 hours after rate change Heparin level artificially elevated d/t recent Xarelto.  Monitor and dose adjust using aPTT until correlation with heparin level. Daily heparin level and CBC  Continue to monitor for s/sx of bleeding   Lindell Spar, PharmD, BCPS Pager: [phone] 12/14/2017 8:04 PM

## 2017-12-14 NOTE — Care Management Note (Signed)
Case Management Note  Patient Details  Name: SHYLAH DOSSANTOS MRN: [id number] Date of Birth: 08-27-1946  Subjective/Objective:                  Dyspnea and chest pain in pt with hx of Sjogren's and lupus  Action/Plan: Date: December 14, 2017 Velva Harman, BSN, [place], [place] Chart and notes review for patient progress and needs. Will follow for case management and discharge needs. Next review date: [id number] Expected Discharge Date:                  Expected Discharge Plan:  Home/Self Care  In-House Referral:     Discharge planning Services  CM Consult  Post Acute Care Choice:    Choice offered to:     DME Arranged:    DME Agency:     HH Arranged:    HH Agency:     Status of Service:  In process, will continue to follow  If discussed at Long Length of Stay Meetings, dates discussed:    Additional Comments:  Leeroy Cha, RN 12/14/2017, 7:57 AM

## 2017-12-14 NOTE — Evaluation (Signed)
Physical Therapy Evaluation Patient Details Name: Hannah Pitts MRN: [id number] DOB: 05-29-1946 Today's Date: 12/14/2017   History of Present Illness  71 yo female admitted with PE, bil LE DVTs. hx of Sjogren's lupus, obesity  Clinical Impression  On eval, pt required Min assist +2 for safety for mobility. She sat EOB for several minutes before standing. Pt c/o 3/10 L LQ pain that worsened with activity-pt c/o burning. After standing for ~15 seconds, pt c/o feeling like she would pass out. Sat pt back on bed immediately. Pt became less responsive. Once back in bed, pt became more alert. BP 112/77. Pt was disappointed she was unable to walk on today. Will continue to follow and progress activity as tolerated. Made RN aware of events.     Follow Up Recommendations Home health PT;Supervision/Assistance - 24 hour    Equipment Recommendations  None recommended by PT    Recommendations for Other Services       Precautions / Restrictions Precautions Precautions: Fall Precaution Comments: nearly passed out Restrictions Weight Bearing Restrictions: No      Mobility  Bed Mobility Overal bed mobility: Needs Assistance Bed Mobility: Supine to Sit;Sit to Supine     Supine to sit: Min guard;HOB elevated Sit to supine: Min assist   General bed mobility comments: Assist needed to get back to bed due to near syncopal episode after standing  Transfers Overall transfer level: Needs assistance   Transfers: Sit to/from Stand Sit to Stand: Min assist         General transfer comment: Assist to rise, steady, control descent. Pt stood ~15 seconds at EOB before c/o feeling like she would pass out. Assisted pt into sitting then back to supine  Ambulation/Gait             General Gait Details: NT due to near syncopal episode  Stairs            Wheelchair Mobility    Modified Rankin (Stroke Patients Only)       Balance Overall balance assessment: Needs assistance           Standing balance-Leahy Scale: Fair                               Pertinent Vitals/Pain Pain Assessment: Faces Faces Pain Scale: Hurts even more Pain Location: L LQ Pain Descriptors / Indicators: Burning Pain Intervention(s): Repositioned;Limited activity within patient's tolerance    Home Living Family/patient expects to be discharged to:: Private residence Living Arrangements: Children Available Help at Discharge: Family Type of Home: House         Home Equipment: Environmental consultant - 2 wheels;Cane - single point      Prior Function Level of Independence: Independent               Hand Dominance        Extremity/Trunk Assessment   Upper Extremity Assessment Upper Extremity Assessment: Generalized weakness    Lower Extremity Assessment Lower Extremity Assessment: Generalized weakness    Cervical / Trunk Assessment Cervical / Trunk Assessment: Normal  Communication   Communication: No difficulties  Cognition Arousal/Alertness: Awake/alert Behavior During Therapy: WFL for tasks assessed/performed Overall Cognitive Status: Within Functional Limits for tasks assessed  General Comments      Exercises     Assessment/Plan    PT Assessment Patient needs continued PT services  PT Problem List Decreased mobility;Decreased activity tolerance;Decreased balance;Pain;Obesity       PT Treatment Interventions DME instruction;Gait training;Functional mobility training;Therapeutic activities;Balance training;Patient/family education;Therapeutic exercise    PT Goals (Current goals can be found in the Care Plan section)  Acute Rehab PT Goals Patient Stated Goal: to get better, stronger PT Goal Formulation: With patient Time For Goal Achievement: 12/28/17 Potential to Achieve Goals: Good    Frequency Min 3X/week   Barriers to discharge        Co-evaluation               AM-PAC PT  "6 Clicks" Daily Activity  Outcome Measure Difficulty turning over in bed (including adjusting bedclothes, sheets and blankets)?: Unable Difficulty moving from lying on back to sitting on the side of the bed? : Unable Difficulty sitting down on and standing up from a chair with arms (e.g., wheelchair, bedside commode, etc,.)?: Unable Help needed moving to and from a bed to chair (including a wheelchair)?: A Little Help needed walking in hospital room?: A Little Help needed climbing 3-5 steps with a railing? : A Little 6 Click Score: 12    End of Session Equipment Utilized During Treatment: Oxygen Activity Tolerance: (limited by syncopal episode) Patient left: in bed;with call bell/phone within reach;with bed alarm set(with RT giving bedside breathing tx)   PT Visit Diagnosis: Muscle weakness (generalized) (M62.81);Difficulty in walking, not elsewhere classified (R26.2)    Time: 5183-3582 PT Time Calculation (min) (ACUTE ONLY): 18 min   Charges:   PT Evaluation $PT Eval Moderate Complexity: 1 Mod     PT G Codes:          Weston Anna, MPT Pager: [phone]

## 2017-12-14 NOTE — Progress Notes (Signed)
233cc of urine in bladder resulted with bladder scan.  Irven [place], RN

## 2017-12-14 NOTE — Progress Notes (Signed)
PROGRESS NOTE  Hannah Pitts  ZOX:[id number] DOB: [birth year] DOA: 12/12/2017 PCP: Tonia Ghent, MD  Brief Narrative:   The patient is a 71 year old female with history of Sjogren's and lupus on hydroxychloroquine, obesity who has had some shortness of breath for the last 5-6 months.  She became acutely more dyspneic over the last 2 days and presented to the emergency department where she was found to have submassive right pulmonary emboli with evidence of right heart strain.  Her initial troponin was minimally elevated.  She was started on a heparin drip and admitted to the stepdown unit.  Lower extremity duplex demonstrated bilateral DVTs and echocardiogram demonstrated grade 1 diastolic dysfunction, preserved ejection fraction of 60-65%.  Right ventricular systolic function was normal.  There was trivial tricuspid valve regurgitation and pulmonary arterial pressure cannot be accurately estimated.  It does not appear that she has significant right heart strain secondary to her submassive PE.  Initially, she was transitioned over to Xarelto, however this morning it appears she has some acute kidney injury.  Her Xarelto has been discontinued and she has been restarted on a heparin drip pending further investigation and treatment of her AK I.  Assessment & Plan:  Acute respiratory failure with hypoxia secondary to acute submassive pulmonary embolism -Resume heparin drip - Echocardiogram: No evidence of right heart strain - venous Dopplers: Bilateral lower extremity DVT in the femoral and popliteal veins, unable to discern if these are acute or chronic -PT evaluation -Ambulatory pulse ox will need to be obtained prior to discharge.  Acute on CKD stage 3, creatinine rose to 2.35 today, baseline of 1.3.   -Urinalysis, urine sodium and creatinine -Renal ultrasound -Start IV fluids -Bladder scan and Place Foley catheter if greater than 300 mL of retained urine  Elevated troponin is  secondary to acute submassive pulmonary embolism, echocardiogram demonstrates no regional wall motion abnormalities, preserved right ventricular systolic function  Possible acute COPD exacerbation, however I think her primary problem is her PE, mild wheezing today -Continue low-dose prednisone -Plan a 3-day treatment with Zithromax  Hypokalemia, resolved with supplementation.  Magnesium level was 1.5, will give magnesium sulfate 4 g IV once.  Leukocytosis, likely secondary to high-dose steroids  Severe debility - physical therapy  SLE, stable, resume Plaquenil  Hard of hearing  Telemetry: Normal sinus rhythm with some episodes of SVT.  Electrolytes have been corrected.  ECHO with preserved EF.   -  Resume low dose metoprolol and titrate up as blood pressure tolerates  DVT prophylaxis: Heparin drip Code Status: full Code Family Communication: Patient alone, no family at bedside Disposition Plan: Home in a couple of days, after addressing AKI and resuming oral anticoagulant  Consultants:   None  Procedures:   Echocardiogram  Lower extremity duplex  Renal ultrasound  Antimicrobials:  Anti-infectives (From admission, onward)   Start     Dose/Rate Route Frequency Ordered Stop   12/13/17 1530  hydroxychloroquine (PLAQUENIL) tablet 200 mg     200 mg Oral 2 times daily 12/13/17 1238     12/12/17 1030  cefTRIAXone (ROCEPHIN) 2 g in dextrose 5 % 50 mL IVPB  Status:  Discontinued     2 g 100 mL/hr over 30 Minutes Intravenous Daily 12/12/17 1022 12/13/17 1234   12/12/17 1030  azithromycin (ZITHROMAX) 500 mg in dextrose 5 % 250 mL IVPB     500 mg 250 mL/hr over 60 Minutes Intravenous Daily 12/12/17 1022 12/15/17 0959       Subjective:  Patient states that she continues to feel better.  She denies any pain in her legs or calves.  She has been breathing comfortably on supplemental oxygen.  She feels a little bit wheezy but has not been coughing very much.  He feels that  she has urinated less this morning and is having some abdominal discomfort in the left upper quadrant and radiating down to the left lower quadrant.  She denies any back pain.  Objective: Vitals:   12/14/17 0600 12/14/17 0800 12/14/17 0847 12/14/17 0856  BP: (!) 153/106     Pulse: 100     Resp: 20     Temp:  98.4 F (36.9 C)    TempSrc:  Oral    SpO2: 94%  (!) 85% 97%  Weight:      Height:        Intake/Output Summary (Last 24 hours) at 12/14/2017 1100 Last data filed at 12/13/2017 1800 Gross per 24 hour  Intake 159.47 ml  Output 500 ml  Net -340.53 ml   Filed Weights   12/12/17 0445  Weight: 108.9 kg (240 lb)    Examination:  General exam:  Adult female.  No acute distress.  HEENT:  NCAT, MMM Respiratory system: Wheezing throughout, no rales, no rhonchi Cardiovascular system: Regular rate and rhythm, normal S1/S2. No murmurs, rubs, gallops or clicks.  Warm extremities Gastrointestinal system: Normal active bowel sounds, soft, nondistended, mild tenderness to palpation in the left upper quadrant and left lower quadrant.  No suprapubic tenderness.  No CVA tenderness.  Urine and canister on wall is dark. MSK:  Normal tone and bulk, 1+ nonpitting left lower extremity edema, no calf tenderness bilaterally Neuro:  Grossly intact  Data Reviewed: I have personally reviewed following labs and imaging studies  CBC: Recent Labs  Lab 12/12/17 0549 12/13/17 0344 12/14/17 0329  WBC 7.6 12.3* 21.2*  HGB 14.0 12.9 12.2  HCT 43.2 40.5 37.7  MCV 92.7 93.8 93.5  PLT 130* 116* 440   Basic Metabolic Panel: Recent Labs  Lab 12/12/17 0549 12/12/17 1832 12/14/17 0329  NA 142  --  139  K 3.1*  --  3.6  CL 104  --  101  CO2 25  --  25  GLUCOSE 132*  --  147*  BUN 19  --  42*  CREATININE 1.38*  --  2.35*  CALCIUM 8.7*  --  8.7*  MG  --  1.5*  --    GFR: Estimated Creatinine Clearance: 26.6 mL/min (A) (by C-G formula based on SCr of 2.35 mg/dL (H)). Liver Function  Tests: Recent Labs  Lab 12/12/17 0549  AST 39  ALT 25  ALKPHOS 70  BILITOT 1.1  PROT 6.8  ALBUMIN 3.6   No results for input(s): LIPASE, AMYLASE in the last 168 hours. No results for input(s): AMMONIA in the last 168 hours. Coagulation Profile: Recent Labs  Lab 12/12/17 1832 12/13/17 0344  INR 1.10 1.15   Cardiac Enzymes: Recent Labs  Lab 12/12/17 0549 12/12/17 1832 12/12/17 2245 12/13/17 0344  TROPONINI 0.10* 0.17* 0.19* 0.14*   BNP (last 3 results) No results for input(s): PROBNP in the last 8760 hours. HbA1C: No results for input(s): HGBA1C in the last 72 hours. CBG: No results for input(s): GLUCAP in the last 168 hours. Lipid Profile: No results for input(s): CHOL, HDL, LDLCALC, TRIG, CHOLHDL, LDLDIRECT in the last 72 hours. Thyroid Function Tests: Recent Labs    12/12/17 1832  TSH 1.166   Anemia Panel: No results  for input(s): VITAMINB12, FOLATE, FERRITIN, TIBC, IRON, RETICCTPCT in the last 72 hours. Urine analysis:    Component Value Date/Time   COLORURINE yellow 12/27/2010 0801   APPEARANCEUR Clear 12/27/2010 0801   LABSPEC >=1.030 12/27/2010 0801   PHURINE 6.0 12/27/2010 0801   GLUCOSEU NEGATIVE 04/03/2010 1100   HGBUR negative 12/27/2010 0801   BILIRUBINUR Neg 05/20/2013 1010   KETONESUR NEGATIVE 04/03/2010 1100   PROTEINUR 2+ 05/20/2013 1010   PROTEINUR 30 (A) 04/03/2010 1100   UROBILINOGEN negative 05/20/2013 1010   UROBILINOGEN 0.2 12/27/2010 0801   NITRITE Neg 05/20/2013 1010   NITRITE negative 12/27/2010 0801   LEUKOCYTESUR Trace 05/20/2013 1010   Sepsis Labs: @LABRCNTIP (procalcitonin:4,lacticidven:4)  ) Recent Results (from the past 240 hour(s))  MRSA PCR Screening     Status: None   Collection Time: 12/12/17  5:14 PM  Result Value Ref Range Status   MRSA by PCR NEGATIVE NEGATIVE Final    Comment:        The GeneXpert MRSA Assay (FDA approved for NASAL specimens only), is one component of a comprehensive MRSA  colonization surveillance program. It is not intended to diagnose MRSA infection nor to guide or monitor treatment for MRSA infections.       Radiology Studies: No results found.   Scheduled Meds: . chlorhexidine  15 mL Mouth Rinse BID  . docusate sodium  100 mg Oral BID  . escitalopram  10 mg Oral Daily  . hydroxychloroquine  200 mg Oral BID  . ipratropium-albuterol  3 mL Nebulization Q6H  . levothyroxine  112 mcg Oral Once per day on Mon Tue Wed Thu Fri Sat  . levothyroxine  56 mcg Oral Q Sun  . mouth rinse  15 mL Mouth Rinse q12n4p  . predniSONE  5 mg Oral BID WC  . sodium chloride flush  3 mL Intravenous Q12H   Continuous Infusions: . sodium chloride    . azithromycin 500 mg (12/14/17 1055)  . heparin 1,300 Units/hr (12/14/17 1058)     LOS: 2 days    Time spent: 30 min    Janece Canterbury, MD [facility] Pager [phone]  If 7PM-7AM, please contact night-coverage [email or web address] Password [email or web address] 12/14/2017, 11:00 AM

## 2017-12-15 LAB — CBC
HEMATOCRIT: 29.3 % — AB (ref 36.0–46.0)
HEMOGLOBIN: 9.5 g/dL — AB (ref 12.0–15.0)
MCH: 30 pg (ref 26.0–34.0)
MCHC: 32.4 g/dL (ref 30.0–36.0)
MCV: 92.4 fL (ref 78.0–100.0)
Platelets: 141 10*3/uL — ABNORMAL LOW (ref 150–400)
RBC: 3.17 MIL/uL — ABNORMAL LOW (ref 3.87–5.11)
RDW: 13.9 % (ref 11.5–15.5)
WBC: 11.5 10*3/uL — ABNORMAL HIGH (ref 4.0–10.5)

## 2017-12-15 LAB — APTT
APTT: 136 s — AB (ref 24–36)
aPTT: 35 seconds (ref 24–36)

## 2017-12-15 LAB — BASIC METABOLIC PANEL
Anion gap: 10 (ref 5–15)
BUN: 57 mg/dL — AB (ref 6–20)
CHLORIDE: 101 mmol/L (ref 101–111)
CO2: 27 mmol/L (ref 22–32)
CREATININE: 3.09 mg/dL — AB (ref 0.44–1.00)
Calcium: 8.7 mg/dL — ABNORMAL LOW (ref 8.9–10.3)
GFR, EST AFRICAN AMERICAN: 16 mL/min — AB (ref >60)
GFR, EST NON AFRICAN AMERICAN: 14 mL/min — AB (ref >60)
Glucose, Bld: 92 mg/dL (ref 65–99)
Potassium: 3.6 mmol/L (ref 3.5–5.1)
SODIUM: 138 mmol/L (ref 135–145)

## 2017-12-15 LAB — HEPARIN LEVEL (UNFRACTIONATED)
HEPARIN UNFRACTIONATED: 0.5 [IU]/mL (ref 0.30–0.70)
Heparin Unfractionated: 1.1 IU/mL — ABNORMAL HIGH (ref 0.30–0.70)

## 2017-12-15 LAB — OCCULT BLOOD X 1 CARD TO LAB, STOOL: Fecal Occult Bld: POSITIVE — AB

## 2017-12-15 MED ORDER — BISACODYL 10 MG RE SUPP
10.0000 mg | Freq: Once | RECTAL | Status: AC
  Administered 2017-12-15: 10 mg via RECTAL
  Filled 2017-12-15: qty 1

## 2017-12-15 MED ORDER — POLYETHYLENE GLYCOL 3350 17 G PO PACK: 17.0000 g | PACK | Freq: Every day | ORAL | Status: DC

## 2017-12-15 MED ORDER — SODIUM CHLORIDE 0.9 % IV SOLN
INTRAVENOUS | Status: DC
  Administered 2017-12-15 – 2017-12-16 (×6): via INTRAVENOUS

## 2017-12-15 MED ORDER — POLYETHYLENE GLYCOL 3350 17 G PO PACK: 17.0000 g | PACK | Freq: Every day | ORAL | Status: DC | PRN

## 2017-12-15 MED ORDER — HEPARIN (PORCINE) IN NACL 100-0.45 UNIT/ML-% IJ SOLN
1200.0000 [IU]/h | INTRAMUSCULAR | Status: DC
  Administered 2017-12-15: 1350 [IU]/h via INTRAVENOUS
  Filled 2017-12-15: qty 250

## 2017-12-15 NOTE — Progress Notes (Signed)
  Speech Language Pathology Treatment: Dysphagia  Patient Details Name: Hannah Pitts MRN: [id number] DOB: 06-05-1946 Today's Date: 12/15/2017 Time: 9476-5465 SLP Time Calculation (min) (ACUTE ONLY): 12 min  Assessment / Plan / Recommendation Clinical Impression  Pt with excellent toleration of diet, observed with consumption of regular solids and large, successive boluses of thin liquids, consumed without s/s of aspiration.  No esophageal complaints today. Adequate respiratory/swallowing coordination with appropriate exhalations post-swallow.  No further acute SLP needs identified - our services will sign off. Pt verbalizes agreement.   HPI HPI: The patient is a 71 year old female with history of COPD, Sjogren's and lupus on hydroxychloroquine, obesity who has had some shortness of breath for the last 5-6 months. Admitted for acute respiratory failure with hypoxia secondary to acute submassive pulmonary embolism with evidence of right heart strain. CXR with no acute process. Pt has history of esophageal stricture s/p savary dilatation 01/24/11. Barium swallow 01/21/11 revealed nonobstructing esophageal web at C5, distal esophageal stricture.       SLP Plan  All goals met       Recommendations  Diet recommendations: Regular;Thin liquid Liquids provided via: Cup;Straw Medication Administration: Whole meds with puree Supervision: Patient able to self feed                Oral Care Recommendations: Oral care BID Follow up Recommendations: None SLP Visit Diagnosis: Dysphagia, unspecified (R13.10) Plan: All goals met       GO                Hannah Pitts 12/15/2017, 3:31 PM

## 2017-12-15 NOTE — Progress Notes (Signed)
PROGRESS NOTE  Hannah Pitts  QVZ:[id number] DOB: 06/04/1946 DOA: 12/12/2017 PCP: Tonia Ghent, MD  Brief Narrative:   The patient is a 71 year old female with history of Sjogren's and lupus on hydroxychloroquine, obesity who has had some shortness of breath for the last 5-6 months.  She became acutely more dyspneic over the last 2 days and presented to the emergency department where she was found to have submassive right pulmonary emboli with evidence of right heart strain.  Her initial troponin was minimally elevated.  She was started on a heparin drip and admitted to the stepdown unit.  Lower extremity duplex demonstrated bilateral DVTs and echocardiogram demonstrated grade 1 diastolic dysfunction, preserved ejection fraction of 60-65%.  Right ventricular systolic function was normal.  There was trivial tricuspid valve regurgitation and pulmonary arterial pressure cannot be accurately estimated.  It does not appear that she has significant right heart strain secondary to her submassive PE.  Initially, she was transitioned over to Xarelto, however this morning it appears she has some acute kidney injury.  Her Xarelto has been discontinued and she has been restarted on a heparin drip pending further investigation and treatment of her AKI.  To an ordering error, her IV fluids were not started yesterday.  I suspect that this is prerenal after she was diuresed when she first came to the emergency department wheezing.  Her renal ultrasound demonstrates no evidence of obstruction.  Her urinalysis was bland, inconsistent with lupus nephritis or ischemic kidney.  She has been started on IV fluids and we will repeat her creatinine tomorrow morning.  If her creatinine continues to rise despite IV fluids, nephrology will be consulted.  Assessment & Plan:  Acute respiratory failure with hypoxia secondary to acute submassive pulmonary embolism - Continue heparin drip - Echocardiogram: No evidence of right  heart strain - venous Dopplers: Bilateral lower extremity DVT in the femoral and popliteal veins, unable to discern if these are acute or chronic -PT: Recommending home health physical therapy with 24-hour assistance, but suspect she will need SNF  -Ambulatory pulse ox will need to be obtained prior to discharge  Acute on CKD stage 3, creatinine continues to rise, baseline of 1.3.   -Urinalysis with bland sediment - FENa 1.13% -Renal ultrasound without obstruction -Start IV fluids today and repeat BMP tomorrow  Elevated troponin is secondary to acute submassive pulmonary embolism, echocardiogram demonstrates no regional wall motion abnormalities, preserved right ventricular systolic function  Possible acute COPD exacerbation, however I think her primary problem is her PE, mild wheezing today -Continue low-dose prednisone -completed 3-day treatment with Zithromax  Hypokalemia, resolved with supplementation.  Magnesium supplemented yesterday. -  Repeat magnesium in AM  Leukocytosis, likely secondary to high-dose steroidsand trending down  Severe debility - physical therapy  SLE, stable, resume Plaquenil  Hard of hearing  SVT:  Telemetry: Normal sinus rhythm, no further episodes of SVT.  Electrolytes have been corrected.  ECHO with preserved EF.   -Continue low dose metoprolol and titrate up as blood pressure tolerates  Normocytic anemia, patient had a little bit of bleeding from where her IV site was.  No icterus on exam to suggest hemolysis. - Occult stool -Iron studies, B12, folate  Constipation, but has diarrhea intermittently  -continue Colace - start MiraLAX prn -Bisacodyl suppository since it has been several days since her last bowel movement.  DVT prophylaxis: Heparin drip Code Status: full Code Family Communication: Patient alone, no family at bedside Disposition Plan: Home in a couple  of days, after addressing AKI and resuming oral  anticoagulant  Consultants:   None  Procedures:   Echocardiogram  Lower extremity duplex  Renal ultrasound  Antimicrobials:  Anti-infectives (From admission, onward)   Start     Dose/Rate Route Frequency Ordered Stop   12/13/17 1530  hydroxychloroquine (PLAQUENIL) tablet 200 mg     200 mg Oral 2 times daily 12/13/17 1238     12/12/17 1030  cefTRIAXone (ROCEPHIN) 2 g in dextrose 5 % 50 mL IVPB  Status:  Discontinued     2 g 100 mL/hr over 30 Minutes Intravenous Daily 12/12/17 1022 12/13/17 1234   12/12/17 1030  azithromycin (ZITHROMAX) 500 mg in dextrose 5 % 250 mL IVPB     500 mg 250 mL/hr over 60 Minutes Intravenous Daily 12/12/17 1022 12/14/17 1155       Subjective:   Patient states that she feels wheezy but otherwise she feels well.  She is feeling better overall.  Is coughing slightly.  She continues to have some pain in the left upper quadrant, but no nausea or vomiting.  He has not had a bowel movement in several days.  Objective: Vitals:   12/15/17 1100 12/15/17 1200 12/15/17 1217 12/15/17 1357  BP:  (!) 133/58  112/86  Pulse: 95 79  86  Resp: (!) 22   (!) 21  Temp:   98.9 F (37.2 C) 99 F (37.2 C)  TempSrc:   Oral Oral  SpO2: 100% 100%  95%  Weight:    110 kg (242 lb 8.1 oz)  Height:    5\' 5"  (1.651 m)    Intake/Output Summary (Last 24 hours) at 12/15/2017 1500 Last data filed at 12/15/2017 1200 Gross per 24 hour  Intake 579.43 ml  Output 270 ml  Net 309.43 ml   Filed Weights   12/12/17 0445 12/15/17 1357  Weight: 108.9 kg (240 lb) 110 kg (242 lb 8.1 oz)    Examination:  General exam:  Adult female.  No acute distress.  HEENT:  NCAT, MMM Respiratory system: Wheezing throughout, no rales, no rhonchi Cardiovascular system: Regular rate and rhythm, normal S1/S2. No murmurs, rubs, gallops or clicks.  Warm extremities Gastrointestinal system: Normal active bowel sounds, soft, nondistended, mild tenderness to palpation in the left upper quadrant  and left lower quadrant.  No suprapubic tenderness.  No CVA tenderness.  Urine and canister on wall is dark. MSK:  Normal tone and bulk, 1+ nonpitting left lower extremity edema, no calf tenderness bilaterally Neuro:  Grossly intact  Data Reviewed: I have personally reviewed following labs and imaging studies  CBC: Recent Labs  Lab 12/12/17 0549 12/13/17 0344 12/14/17 0329 12/15/17 0537  WBC 7.6 12.3* 21.2* 11.5*  HGB 14.0 12.9 12.2 9.5*  HCT 43.2 40.5 37.7 29.3*  MCV 92.7 93.8 93.5 92.4  PLT 130* 116* 180 536*   Basic Metabolic Panel: Recent Labs  Lab 12/12/17 0549 12/12/17 1832 12/14/17 0329 12/15/17 0537  NA 142  --  139 138  K 3.1*  --  3.6 3.6  CL 104  --  101 101  CO2 25  --  25 27  GLUCOSE 132*  --  147* 92  BUN 19  --  42* 57*  CREATININE 1.38*  --  2.35* 3.09*  CALCIUM 8.7*  --  8.7* 8.7*  MG  --  1.5*  --   --    GFR: Estimated Creatinine Clearance: 20.3 mL/min (A) (by C-G formula based on SCr of 3.09 mg/dL (  H)). Liver Function Tests: Recent Labs  Lab 12/12/17 0549  AST 39  ALT 25  ALKPHOS 70  BILITOT 1.1  PROT 6.8  ALBUMIN 3.6   No results for input(s): LIPASE, AMYLASE in the last 168 hours. No results for input(s): AMMONIA in the last 168 hours. Coagulation Profile: Recent Labs  Lab 12/12/17 1832 12/13/17 0344  INR 1.10 1.15   Cardiac Enzymes: Recent Labs  Lab 12/12/17 0549 12/12/17 1832 12/12/17 2245 12/13/17 0344  TROPONINI 0.10* 0.17* 0.19* 0.14*   BNP (last 3 results) No results for input(s): PROBNP in the last 8760 hours. HbA1C: No results for input(s): HGBA1C in the last 72 hours. CBG: No results for input(s): GLUCAP in the last 168 hours. Lipid Profile: No results for input(s): CHOL, HDL, LDLCALC, TRIG, CHOLHDL, LDLDIRECT in the last 72 hours. Thyroid Function Tests: Recent Labs    12/12/17 1832  TSH 1.166   Anemia Panel: No results for input(s): VITAMINB12, FOLATE, FERRITIN, TIBC, IRON, RETICCTPCT in the last 72  hours. Urine analysis:    Component Value Date/Time   COLORURINE YELLOW 12/14/2017 1406   APPEARANCEUR CLEAR 12/14/2017 1406   LABSPEC 1.014 12/14/2017 1406   PHURINE 5.0 12/14/2017 1406   GLUCOSEU NEGATIVE 12/14/2017 1406   HGBUR NEGATIVE 12/14/2017 1406   HGBUR negative 12/27/2010 0801   BILIRUBINUR NEGATIVE 12/14/2017 1406   BILIRUBINUR Neg 05/20/2013 1010   KETONESUR NEGATIVE 12/14/2017 1406   PROTEINUR NEGATIVE 12/14/2017 1406   UROBILINOGEN negative 05/20/2013 1010   UROBILINOGEN 0.2 12/27/2010 0801   NITRITE NEGATIVE 12/14/2017 1406   LEUKOCYTESUR NEGATIVE 12/14/2017 1406   Sepsis Labs: @LABRCNTIP (procalcitonin:4,lacticidven:4)  ) Recent Results (from the past 240 hour(s))  MRSA PCR Screening     Status: None   Collection Time: 12/12/17  5:14 PM  Result Value Ref Range Status   MRSA by PCR NEGATIVE NEGATIVE Final    Comment:        The GeneXpert MRSA Assay (FDA approved for NASAL specimens only), is one component of a comprehensive MRSA colonization surveillance program. It is not intended to diagnose MRSA infection nor to guide or monitor treatment for MRSA infections.       Radiology Studies: US Renal  Result Date: 12/14/2017 CLINICAL DATA:  Acute kidney injury. EXAM: RENAL / URINARY TRACT ULTRASOUND COMPLETE COMPARISON:  Included portion from chest CT 12/12/2017 FINDINGS: Right Kidney: Length: 8.8 cm. Diffuse thinning of renal parenchyma. Echogenicity within normal limits. No mass or hydronephrosis visualized. Cyst in the upper right kidney on prior CT is not visualize sonographically. Left Kidney: Length: 8.9 cm. Diffuse thinning of renal parenchyma. Echogenicity within normal limits. No mass or hydronephrosis visualized. Bladder: Only minimally distended. Appears normal for degree of bladder distention. IMPRESSION: Renal atrophy.  No hydronephrosis. Electronically Signed   By: Jeb Levering M.D.   On: 12/14/2017 21:48     Scheduled Meds: .  chlorhexidine  15 mL Mouth Rinse BID  . docusate sodium  100 mg Oral BID  . escitalopram  10 mg Oral Daily  . hydroxychloroquine  200 mg Oral BID  . ipratropium-albuterol  3 mL Nebulization TID  . levothyroxine  112 mcg Oral Once per day on Mon Tue Wed Thu Fri Sat  . levothyroxine  56 mcg Oral Q Sun  . mouth rinse  15 mL Mouth Rinse q12n4p  . metoprolol tartrate  12.5 mg Oral BID  . predniSONE  5 mg Oral BID WC   Continuous Infusions: . sodium chloride 125 mL/hr at 12/15/17 1100  .  heparin 1,450 Units/hr (12/15/17 1100)     LOS: 3 days    Time spent: 30 min    Janece Canterbury, MD [facility] Pager [phone]  If 7PM-7AM, please contact night-coverage [email or web address] Password [email or web address] 12/15/2017, 3:00 PM

## 2017-12-15 NOTE — Progress Notes (Signed)
ANTICOAGULATION CONSULT NOTE  Pharmacy Consult for Heparin IV Indication: pulmonary embolus and DVT  No Known Allergies  Patient Measurements: Height: 5\' 5"  (165.1 cm) Weight: 242 lb 8.1 oz (110 kg) IBW/kg (Calculated) : 57  Heparin dosing weight 82.5 kg  Vital Signs: Temp: 99 F (37.2 C) (12/18 1357) Temp Source: Oral (12/18 1357) BP: 112/86 (12/18 1357) Pulse Rate: 86 (12/18 1357)  Labs: Recent Labs    12/12/17 1832 12/12/17 2245  12/13/17 0344  12/14/17 0329  12/14/17 1845 12/15/17 0537 12/15/17 1406  HGB  --   --    < > 12.9  --  12.2  --   --  9.5*  --   HCT  --   --   --  40.5  --  37.7  --   --  29.3*  --   PLT  --   --   --  116*  --  180  --   --  141*  --   APTT 24  --   --  62*  --   --    < > 50* 35 136*  LABPROT 14.1  --   --  14.6  --   --   --   --   --   --   INR 1.10  --   --  1.15  --   --   --   --   --   --   HEPARINUNFRC  --   --    < >  --    < >  --    < > 1.28* 0.50 1.10*  CREATININE  --   --   --   --   --  2.35*  --   --  3.09*  --   TROPONINI 0.17* 0.19*  --  0.14*  --   --   --   --   --   --    < > = values in this interval not displayed.    Estimated Creatinine Clearance: 20.3 mL/min (A) (by C-G formula based on SCr of 3.09 mg/dL (H)).   Infusions:  . sodium chloride 125 mL/hr at 12/15/17 1100  . heparin      Assessment: 24 yoF with PMH Sjogren's syndrome, SLE, with SOB x 5-6 months, now with cough and SOB acutely worsening. CXR clear but CTa chest showed large R PE with RH strain. Heparin per pharmacy on 12/15, however signed & held order was not released until 7 hrs later.  No prior anticoagulation.  Significant events:  12/16 heparin held 1 hr for elevated level 12/16 non-severe bleeding noted at IV site; still continues at the time of this note writing, but has improved from earlier 12/16 PM - Switch from Heparin to Xarelto.  First dose given ~22:00 12/17 Transition back to Heparin d/t AKI  Today, 12/15/2017   APTT 35 is  subtherapeutic, Heparin level 0.5 is therapeutic (but expect this is artificially elevated).  However, these labs were obtained at 0537 and the heparin infusion had to be stopped at 0539 for ~ 2 hours until 739 due to poor IV access.  I suspect that the heparin may not have been infusing correctly prior to the lab draw and the level look artificially low.    Due to previously supra-therapeutic level on higher heparin infusion rates, will continue current rate and recheck labs.  CBC: Hgb decreased to 9.5 from 12.2; Plt decreased to 141k  Some bleeding at IV site (noted  12/16), no further bleeding reported 12/17.  RN reports fairly large area of bruise around 2 prior IV sites in her forearm and antecubital area.  SCr continues to increase 1.3 > 2.35 > 3.09, with CrCl ~ 20 ml/min  4:03 PM - Heparin level elevated as expected (1.1) - APTT elevated (136) - No bleeding reported per discussion with RN  Goal of Therapy: Heparin level 0.3-0.7 units/ml APTT 66-102 Monitor platelets by anticoagulation protocol: Yes  Plan:  Hold heparin drip x 1 hour  Decrease heparin drip to 1350 units/hr  Recheck APTT and heparin level in 8hrs  Peggyann Juba, PharmD, BCPS Pager: [phone] 12/15/2017 4:03 PM

## 2017-12-15 NOTE — Progress Notes (Signed)
ANTICOAGULATION CONSULT NOTE  Pharmacy Consult for Heparin IV Indication: pulmonary embolus and DVT  No Known Allergies  Patient Measurements: Height: 5\' 5"  (165.1 cm) Weight: 240 lb (108.9 kg) IBW/kg (Calculated) : 57  Heparin dosing weight 82.5 kg  Vital Signs: Temp: 97.9 F (36.6 C) (12/18 0401) Temp Source: Oral (12/18 0401) BP: 126/43 (12/18 0000) Pulse Rate: 76 (12/18 0000)  Labs: Recent Labs    12/12/17 1832 12/12/17 2245  12/13/17 0344  12/14/17 0329 12/14/17 0859 12/14/17 1845 12/15/17 0537  HGB  --   --    < > 12.9  --  12.2  --   --  9.5*  HCT  --   --   --  40.5  --  37.7  --   --  29.3*  PLT  --   --   --  116*  --  180  --   --  141*  APTT 24  --   --  62*  --   --  24 50* 35  LABPROT 14.1  --   --  14.6  --   --   --   --   --   INR 1.10  --   --  1.15  --   --   --   --   --   HEPARINUNFRC  --   --    < >  --    < >  --  >2.20* 1.28* 0.50  CREATININE  --   --   --   --   --  2.35*  --   --  3.09*  TROPONINI 0.17* 0.19*  --  0.14*  --   --   --   --   --    < > = values in this interval not displayed.    Estimated Creatinine Clearance: 20.2 mL/min (A) (by C-G formula based on SCr of 3.09 mg/dL (H)).   Infusions:  . sodium chloride Stopped (12/15/17 0540)  . heparin 1,450 Units/hr (12/15/17 0739)    Assessment: Hannah Pitts with PMH Sjogren's syndrome, SLE, with SOB x 5-6 months, now with cough and SOB acutely worsening. CXR clear but CTa chest showed large R PE with RH strain. Heparin per pharmacy on 12/15, however signed & held order was not released until 7 hrs later.  No prior anticoagulation.  Significant events:  12/16 heparin held 1 hr for elevated level 12/16 non-severe bleeding noted at IV site; still continues at the time of this note writing, but has improved from earlier 12/16 PM - Switch from Heparin to Xarelto.  First dose given ~22:00 12/17 Transition back to Heparin d/t AKI  Today, 12/15/2017   APTT 35 is subtherapeutic, Heparin  level 0.5 is therapeutic (but expect this is artificially elevated).  However, these labs were obtained at 0537 and the heparin infusion had to be stopped at 0539 for ~ 2 hours until 739 due to poor IV access.  I suspect that the heparin may not have been infusing correctly prior to the lab draw and the level look artificially low.    Due to previously supra-therapeutic level on higher heparin infusion rates, will continue current rate and recheck labs.  CBC: Hgb decreased to 9.5 from 12.2; Plt decreased to 141k  Some bleeding at IV site (noted 12/16), no further bleeding reported 12/17.  RN reports fairly large area of bruise around 2 prior IV sites in her forearm and antecubital area.  SCr continues to increase 1.3 >  2.35 > 3.09, with CrCl ~ 20 ml/min  Goal of Therapy: Heparin level 0.3-0.7 units/ml APTT 66-102 Monitor platelets by anticoagulation protocol: Yes  Plan: Continue heparin IV infusion at 1450 units/hr Heparin level, APTT 8 hours after re-starting infusion Heparin level artificially elevated d/t recent Xarelto.  Monitor and dose adjust using APTT until correlation with heparin level. Daily heparin level and CBC  Continue to monitor H&H and platelets  Follow up plans for resuming Xarelto when renal function improves.   Gretta Arab PharmD, BCPS Pager [phone] 12/15/2017 8:14 AM

## 2017-12-16 ENCOUNTER — Inpatient Hospital Stay (HOSPITAL_COMMUNITY): Payer: Medicare Other

## 2017-12-16 DIAGNOSIS — I2609 Other pulmonary embolism with acute cor pulmonale: Secondary | ICD-10-CM

## 2017-12-16 LAB — VITAMIN B12: Vitamin B-12: 743 pg/mL (ref 180–914)

## 2017-12-16 LAB — CBC
HCT: 22.8 % — ABNORMAL LOW (ref 36.0–46.0)
HEMOGLOBIN: 7.6 g/dL — AB (ref 12.0–15.0)
MCH: 30.4 pg (ref 26.0–34.0)
MCHC: 33.3 g/dL (ref 30.0–36.0)
MCV: 91.2 fL (ref 78.0–100.0)
PLATELETS: 125 10*3/uL — AB (ref 150–400)
RBC: 2.5 MIL/uL — ABNORMAL LOW (ref 3.87–5.11)
RDW: 13.7 % (ref 11.5–15.5)
WBC: 9.4 10*3/uL (ref 4.0–10.5)

## 2017-12-16 LAB — BASIC METABOLIC PANEL
ANION GAP: 8 (ref 5–15)
BUN: 51 mg/dL — ABNORMAL HIGH (ref 6–20)
CALCIUM: 7.8 mg/dL — AB (ref 8.9–10.3)
CO2: 26 mmol/L (ref 22–32)
CREATININE: 1.87 mg/dL — AB (ref 0.44–1.00)
Chloride: 101 mmol/L (ref 101–111)
GFR, EST AFRICAN AMERICAN: 30 mL/min — AB (ref >60)
GFR, EST NON AFRICAN AMERICAN: 26 mL/min — AB (ref >60)
GLUCOSE: 96 mg/dL (ref 65–99)
Potassium: 3.7 mmol/L (ref 3.5–5.1)
Sodium: 135 mmol/L (ref 135–145)

## 2017-12-16 LAB — HEMOGLOBIN AND HEMATOCRIT, BLOOD
HCT: 24.3 % — ABNORMAL LOW (ref 36.0–46.0)
Hemoglobin: 8.1 g/dL — ABNORMAL LOW (ref 12.0–15.0)

## 2017-12-16 LAB — IRON AND TIBC
IRON: 18 ug/dL — AB (ref 28–170)
Saturation Ratios: 8 % — ABNORMAL LOW (ref 10.4–31.8)
TIBC: 232 ug/dL — AB (ref 250–450)
UIBC: 214 ug/dL

## 2017-12-16 LAB — APTT
APTT: 148 s — AB (ref 24–36)
APTT: 175 s — AB (ref 24–36)
APTT: 89 s — AB (ref 24–36)

## 2017-12-16 LAB — HEPARIN LEVEL (UNFRACTIONATED)
HEPARIN UNFRACTIONATED: 1.26 [IU]/mL — AB (ref 0.30–0.70)
HEPARIN UNFRACTIONATED: 1.03 [IU]/mL — AB (ref 0.30–0.70)
HEPARIN UNFRACTIONATED: 0.48 [IU]/mL (ref 0.30–0.70)

## 2017-12-16 LAB — FERRITIN: FERRITIN: 28 ng/mL (ref 11–307)

## 2017-12-16 LAB — FOLATE: Folate: 22 ng/mL (ref >5.9)

## 2017-12-16 MED ORDER — ORAL CARE MOUTH RINSE
15.0000 mL | Freq: Two times a day (BID) | OROMUCOSAL | Status: DC
  Administered 2017-12-16 – 2017-12-24 (×14): 15 mL via OROMUCOSAL

## 2017-12-16 MED ORDER — HEPARIN (PORCINE) IN NACL 100-0.45 UNIT/ML-% IJ SOLN
900.0000 [IU]/h | INTRAMUSCULAR | Status: DC
  Administered 2017-12-16: 900 [IU]/h via INTRAVENOUS
  Filled 2017-12-16: qty 250

## 2017-12-16 NOTE — Progress Notes (Signed)
ANTICOAGULATION CONSULT NOTE - Follow Up Consult  Pharmacy Consult for Heparin Indication: pulmonary embolus and DVT  No Known Allergies  Patient Measurements: Height: 5\' 5"  (165.1 cm) Weight: 242 lb 8.1 oz (110 kg) IBW/kg (Calculated) : 57 Heparin Dosing Weight:   Vital Signs: Temp: 99.3 F (37.4 C) (12/19 0406) Temp Source: Oral (12/19 0406) BP: 121/55 (12/19 0406) Pulse Rate: 79 (12/19 0406)  Labs: Recent Labs    12/14/17 0329  12/15/17 0537 12/15/17 1406 12/16/17 0106  HGB 12.2  --  9.5*  --  7.6*  HCT 37.7  --  29.3*  --  22.8*  PLT 180  --  141*  --  125*  APTT  --    < > 35 136* 148*  HEPARINUNFRC  --    < > 0.50 1.10* 1.26*  CREATININE 2.35*  --  3.09*  --  1.87*   < > = values in this interval not displayed.    Estimated Creatinine Clearance: 33.6 mL/min (A) (by C-G formula based on SCr of 1.87 mg/dL (H)).   Medications:  Infusions:  . sodium chloride 125 mL/hr at 12/16/17 0100  . heparin 1,200 Units/hr (12/16/17 0226)    Assessment: Patient with high heparin level and PTT.  Will only use PTT at this time due to prior rivaroxaban use.  PTT ordered with Heparin level until both correlate due to possible drug-lab interaction between oral anticoagulant (rivaroxaban, edoxaban, or apixaban) and anti-Xa level (aka heparin level)  No heparin issues per RN.  RN called back to confirm noted FOB + and drop in Hgb to 7.6.  Night coverage aware of Hgb drop from 12.2 -> 9.5 -> 7.6.    Goal of Therapy:  Heparin level 0.3-0.7 units/ml aPTT 66-102 seconds Monitor platelets by anticoagulation protocol: Yes   Plan:  Decrease heparin to 1200 units/hr Recheck heparin level/PTT at 1100.  Tyler Deis, Shea Stakes [place] 12/16/2017,4:17 AM

## 2017-12-16 NOTE — Progress Notes (Signed)
Physical Therapy Treatment Patient Details Name: Hannah Pitts MRN: [id number] DOB: Jul 04, 1946 Today's Date: 12/16/2017    History of Present Illness 71 yo female admitted with PE, bil LE DVTs. hx of Sjogren's lupus, obesity    PT Comments    The patient had no episodes today. Does have dyspnea with minimal mobility. Saturation on 2 L 100%. HR 89. The patient  Did ask about going to rehab as we discussed that she will need 24 hr/7 caregivers. Continue PT.  Follow Up Recommendations  SNF     Equipment Recommendations  None recommended by PT    Recommendations for Other Services       Precautions / Restrictions Precautions Precautions: Fall Precaution Comments: incontinent    Mobility  Bed Mobility Overal bed mobility: Needs Assistance Bed Mobility: Supine to Sit;Sit to Supine     Supine to sit: Mod assist;Min assist Sit to supine: Mod assist   General bed mobility comments: assist for trunk, HOB raised. Assist for legs onto bed  Transfers Overall transfer level: Needs assistance Equipment used: Rolling walker (2 wheeled) Transfers: Sit to/from Stand           General transfer comment: Assist to rise, steady, control descent. Pt stood ~ 10 secs x 3. Pericare provided . Took 3 small side steps along bed.  Ambulation/Gait                 Stairs            Wheelchair Mobility    Modified Rankin (Stroke Patients Only)       Balance                                            Cognition Arousal/Alertness: Awake/alert Behavior During Therapy: WFL for tasks assessed/performed                                          Exercises      General Comments        Pertinent Vitals/Pain Pain Assessment: No/denies pain    Home Living                      Prior Function            PT Goals (current goals can now be found in the care plan section) Progress towards PT goals: Progressing toward  goals    Frequency    Min 2X/week      PT Plan Discharge plan needs to be updated;Frequency needs to be updated    Co-evaluation              AM-PAC PT "6 Clicks" Daily Activity  Outcome Measure  Difficulty turning over in bed (including adjusting bedclothes, sheets and blankets)?: Unable Difficulty moving from lying on back to sitting on the side of the bed? : Unable Difficulty sitting down on and standing up from a chair with arms (e.g., wheelchair, bedside commode, etc,.)?: Unable Help needed moving to and from a bed to chair (including a wheelchair)?: Total Help needed walking in hospital room?: Total Help needed climbing 3-5 steps with a railing? : Total 6 Click Score: 6    End of Session Equipment Utilized During Treatment: Oxygen Activity Tolerance: Patient tolerated treatment well  Patient left: in bed;with call bell/phone within reach;with bed alarm set;with nursing/sitter in room Nurse Communication: Mobility status       Time: 1450-1520 PT Time Calculation (min) (ACUTE ONLY): 30 min  Charges:  $Therapeutic Activity: 23-37 mins                    G CodesTresa Endo PT [id number] }   Claretha Cooper 12/16/2017, 4:31 PM

## 2017-12-16 NOTE — Care Management Important Message (Signed)
Important Message  Patient Details  Name: Hannah Pitts MRN: [id number] Date of Birth: 27-Mar-1946   Medicare Important Message Given:  Yes    Kerin Salen 12/16/2017, 10:18 AMImportant Message  Patient Details  Name: Hannah Pitts MRN: [id number] Date of Birth: 1946-12-10   Medicare Important Message Given:  Yes    Kerin Salen 12/16/2017, 10:18 AM

## 2017-12-16 NOTE — Progress Notes (Signed)
Patient's Hgb dropped from 9.5 to 7.6 this morning. On call was notified and new orders were given to get an H/H this AM at 0500. Will continue to monitor.

## 2017-12-16 NOTE — Progress Notes (Signed)
Spoke with pt who declined Home Health at this present time.

## 2017-12-16 NOTE — Progress Notes (Signed)
ANTICOAGULATION CONSULT NOTE  Pharmacy Consult for Heparin IV Indication: pulmonary embolus and DVT  No Known Allergies  Patient Measurements: Height: 5\' 5"  (165.1 cm) Weight: 242 lb 8.1 oz (110 kg) IBW/kg (Calculated) : 57  Heparin dosing weight 82.5 kg  Vital Signs: Temp: 99.3 F (37.4 C) (12/19 0406) Temp Source: Oral (12/19 0406) BP: 121/55 (12/19 0406) Pulse Rate: 79 (12/19 0406)  Labs: Recent Labs    12/14/17 0329  12/15/17 0537 12/15/17 1406 12/16/17 0106 12/16/17 0453 12/16/17 1057  HGB 12.2  --  9.5*  --  7.6* 8.1*  --   HCT 37.7  --  29.3*  --  22.8* 24.3*  --   PLT 180  --  141*  --  125*  --   --   APTT  --    < > 35 136* 148*  --  175*  HEPARINUNFRC  --    < > 0.50 1.10* 1.26*  --   --   CREATININE 2.35*  --  3.09*  --  1.87*  --   --    < > = values in this interval not displayed.    Estimated Creatinine Clearance: 33.6 mL/min (A) (by C-G formula based on SCr of 1.87 mg/dL (H)).   Infusions:  . sodium chloride 125 mL/hr at 12/16/17 0920  . heparin 1,200 Units/hr (12/16/17 0226)    Assessment: 51 yoF with PMH Sjogren's syndrome, SLE, with SOB x 5-6 months, now with cough and SOB acutely worsening. CXR clear but CTa chest showed large R PE with RH strain. Heparin per pharmacy on 12/15, however signed & held order was not released until 7 hrs later.  No prior anticoagulation.  Significant events:  12/16 heparin held 1 hr for elevated level 12/16 non-severe bleeding noted at IV site; still continues at the time of this note writing, but has improved from earlier 12/16 PM - Switch from Heparin to Xarelto.  First dose given ~22:00 12/17 Transition back to Heparin d/t AKI  Today, 12/16/2017   APTT 175 supratherapeutic, Heparin level still pending but expect this to be artificially elevated due to Xarelto effects prior to changing back to IV UFH  CBC: Hgb continues to trend downward 14 to 12.9 to 12.2 to 9.5 to 7.5 to 8.1; Plt decreased from 141 to  125k  Small amount of blood noted in tissue after patient blew nose.  RN reports fairly large area of bruise around 2 prior IV sites in her forearm and antecubital area but has not worsened  SCr was increasing 1.3 > 2.35 > 3.09 but improved today to 1.87, with CrCl 34 ml/min   Goal of Therapy: Heparin level 0.3-0.7 units/ml APTT 66-102 Monitor platelets by anticoagulation protocol: Yes  Plan:  Hold heparin drip x 1 hour  Decrease heparin drip from 1200 units/hr to 900 units/hr  Recheck APTT and heparin level in [place], PharmD, BCPS Pager [phone] 12/16/2017 12:55 PM

## 2017-12-16 NOTE — Progress Notes (Addendum)
PROGRESS NOTE    Hannah Pitts  KXF:[id number] DOB: 1946-01-16 DOA: 12/12/2017 PCP: Tonia Ghent, MD   Brief Narrative 71 year old female with history of Sjogren's and lupus on hydroxychloroquine, obesity who has had some shortness of breath for the last 5-6 months.  She became acutely more dyspneic over the last 2 days and presented to the emergency department where she was found to have submassive right pulmonary emboli with evidence of right heart strain.  Her initial troponin was minimally elevated.  She was started on a heparin drip and admitted to the stepdown unit.  Lower extremity duplex demonstrated bilateral DVTs and echocardiogram demonstrated grade 1 diastolic dysfunction, preserved ejection fraction of 60-65%.  Right ventricular systolic function was normal.  There was trivial tricuspid valve regurgitation and pulmonary arterial pressure cannot be accurately estimated.  It does not appear that she has significant right heart strain secondary to her submassive PE.  Initially, she was transitioned over to Xarelto, however this morning it appears she has some acute kidney injury.  Her Xarelto has been discontinued and she has been restarted on a heparin drip pending further investigation and treatment of her AKI.  To an ordering error, her IV fluids were not started yesterday.  I suspect that this is prerenal after she was diuresed when she first came to the emergency department wheezing.  Her renal ultrasound demonstrates no evidence of obstruction.  Her urinalysis was bland, inconsistent with lupus nephritis or ischemic kidney.  She has been started on IV fluids and we will repeat her creatinine tomorrow morning.  If her creatinine continues to rise despite IV fluids, nephrology will be consulted CT abdomen order as patient has abdominal pain. 12/19- complains of abdominal pain,feels a bit better.   Active Problems:   Debility   COPD with acute bronchitis (HCC)   Pulmonary embolism  (HCC)   Elevated troponin   Congestive heart failure (CHF) (HCC)   Obesity hypoventilation syndrome (HCC)  Acute respiratory failure with hypoxia secondary to acute submassive pulmonary embolism - Continue heparin drip - Echocardiogram: No evidence of right heart strain - venous Dopplers: Bilateral lower extremity DVT in the femoral and popliteal veins, unable to discern if these are acute or chronic -PT: Recommending home health physical therapy with 24-hour assistance, but suspect she will need SNF  -Ambulatory pulse ox will need to be obtained prior to discharge  ANEMIA- ct abdomen to ro bleed.  Acute on CKD stage 3, creatinine continues to rise, baseline of 1.3.   -Urinalysis with bland sediment - FENa 1.13% -Renal ultrasound without obstruction -Start IV fluids today and repeat BMP tomorrow  Elevated troponin is secondary to acute submassive pulmonary embolism, echocardiogram demonstrates no regional wall motion abnormalities, preserved right ventricular systolic function  Possible acute COPD exacerbation, however I think her primary problem is her PE, mild wheezing today -Continue low-dose prednisone    DVT prophylaxis:heparin Code Status full Disposition Plan: tbd Consultants: none  Procedures:none Antimicrobials: none Subjective: Co abdominal pain  Objective:resting in bed.nad.feels a bit better. Vitals:   12/16/17 0415 12/16/17 0814 12/16/17 1310 12/16/17 1355  BP:   (!) 124/55   Pulse:   77 78  Resp:   20 18  Temp:   98 F (36.7 C)   TempSrc:   Oral   SpO2: 95% 96% 99% 93%  Weight:      Height:        Intake/Output Summary (Last 24 hours) at 12/16/2017 1605 Last data filed at 12/16/2017 1500  Gross per 24 hour  Intake 3377.38 ml  Output 1000 ml  Net 2377.38 ml   Filed Weights   12/12/17 0445 12/15/17 1357  Weight: 108.9 kg (240 lb) 110 kg (242 lb 8.1 oz)    Examination:  General exam: Appears calm and comfortable  Respiratory system:  Clear to auscultation. Respiratory effort normal. Cardiovascular system: S1 & S2 heard, RRR. No JVD, murmurs, rubs, gallops or clicks. No pedal edema. Gastrointestinal system: Abdomen is nondistended, soft and tender. GUARDING . organomegaly or masses felt. Normal bowel sounds heard. Central nervous system: Alert and oriented. No focal neurological deficits. Extremities: Symmetric 5 x 5 power. Skin: No rashes, lesions or ulcers Psychiatry: Judgement and insight appear normal. Mood & affect appropriate.     Data Reviewed: I have personally reviewed following labs and imaging studies  CBC: Recent Labs  Lab 12/12/17 0549 12/13/17 0344 12/14/17 0329 12/15/17 0537 12/16/17 0106 12/16/17 0453  WBC 7.6 12.3* 21.2* 11.5* 9.4  --   HGB 14.0 12.9 12.2 9.5* 7.6* 8.1*  HCT 43.2 40.5 37.7 29.3* 22.8* 24.3*  MCV 92.7 93.8 93.5 92.4 91.2  --   PLT 130* 116* 180 141* 125*  --    Basic Metabolic Panel: Recent Labs  Lab 12/12/17 0549 12/12/17 1832 12/14/17 0329 12/15/17 0537 12/16/17 0106  NA 142  --  139 138 135  K 3.1*  --  3.6 3.6 3.7  CL 104  --  101 101 101  CO2 25  --  25 27 26   GLUCOSE 132*  --  147* 92 96  BUN 19  --  42* 57* 51*  CREATININE 1.38*  --  2.35* 3.09* 1.87*  CALCIUM 8.7*  --  8.7* 8.7* 7.8*  MG  --  1.5*  --   --   --    GFR: Estimated Creatinine Clearance: 33.6 mL/min (A) (by C-G formula based on SCr of 1.87 mg/dL (H)). Liver Function Tests: Recent Labs  Lab 12/12/17 0549  AST 39  ALT 25  ALKPHOS 70  BILITOT 1.1  PROT 6.8  ALBUMIN 3.6   No results for input(s): LIPASE, AMYLASE in the last 168 hours. No results for input(s): AMMONIA in the last 168 hours. Coagulation Profile: Recent Labs  Lab 12/12/17 1832 12/13/17 0344  INR 1.10 1.15   Cardiac Enzymes: Recent Labs  Lab 12/12/17 0549 12/12/17 1832 12/12/17 2245 12/13/17 0344  TROPONINI 0.10* 0.17* 0.19* 0.14*   BNP (last 3 results) No results for input(s): PROBNP in the last 8760  hours. HbA1C: No results for input(s): HGBA1C in the last 72 hours. CBG: No results for input(s): GLUCAP in the last 168 hours. Lipid Profile: No results for input(s): CHOL, HDL, LDLCALC, TRIG, CHOLHDL, LDLDIRECT in the last 72 hours. Thyroid Function Tests: No results for input(s): TSH, T4TOTAL, FREET4, T3FREE, THYROIDAB in the last 72 hours. Anemia Panel: Recent Labs    12/16/17 0106  VITAMINB12 743  FOLATE 22.0  FERRITIN 28  TIBC 232*  IRON 18*   Sepsis Labs: No results for input(s): PROCALCITON, LATICACIDVEN in the last 168 hours.  Recent Results (from the past 240 hour(s))  MRSA PCR Screening     Status: None   Collection Time: 12/12/17  5:14 PM  Result Value Ref Range Status   MRSA by PCR NEGATIVE NEGATIVE Final    Comment:        The GeneXpert MRSA Assay (FDA approved for NASAL specimens only), is one component of a comprehensive MRSA colonization surveillance program. It  is not intended to diagnose MRSA infection nor to guide or monitor treatment for MRSA infections.          Radiology Studies: US Renal  Result Date: 12/14/2017 CLINICAL DATA:  Acute kidney injury. EXAM: RENAL / URINARY TRACT ULTRASOUND COMPLETE COMPARISON:  Included portion from chest CT 12/12/2017 FINDINGS: Right Kidney: Length: 8.8 cm. Diffuse thinning of renal parenchyma. Echogenicity within normal limits. No mass or hydronephrosis visualized. Cyst in the upper right kidney on prior CT is not visualize sonographically. Left Kidney: Length: 8.9 cm. Diffuse thinning of renal parenchyma. Echogenicity within normal limits. No mass or hydronephrosis visualized. Bladder: Only minimally distended. Appears normal for degree of bladder distention. IMPRESSION: Renal atrophy.  No hydronephrosis. Electronically Signed   By: Jeb Levering M.D.   On: 12/14/2017 21:48        Scheduled Meds: . docusate sodium  100 mg Oral BID  . escitalopram  10 mg Oral Daily  . hydroxychloroquine  200 mg Oral  BID  . ipratropium-albuterol  3 mL Nebulization TID  . levothyroxine  112 mcg Oral Once per day on Mon Tue Wed Thu Fri Sat  . levothyroxine  56 mcg Oral Q Sun  . mouth rinse  15 mL Mouth Rinse BID  . metoprolol tartrate  12.5 mg Oral BID  . predniSONE  5 mg Oral BID WC   Continuous Infusions: . sodium chloride 125 mL/hr at 12/16/17 0920  . heparin 900 Units/hr (12/16/17 1408)     LOS: 4 days      Georgette Shell, MD [facility]  If 7PM-7AM, please contact night-coverage [email or web address] Password [email or web address] 12/16/2017, 4:05 PM

## 2017-12-16 NOTE — Plan of Care (Signed)
  Health Behavior/Discharge Planning: Ability to manage health-related needs will improve 12/16/2017 2117 - Progressing by Ashley Murrain, RN   Clinical Measurements: Will remain free from infection 12/16/2017 2117 - Progressing by Ashley Murrain, RN   Clinical Measurements: Respiratory complications will improve 12/16/2017 2117 - Progressing by Ashley Murrain, RN

## 2017-12-16 NOTE — Progress Notes (Signed)
Follow-up:  Notified by RN regarding continued decrease in Hb. From 12.2 on 12/14/17 to 9.5 on 12/15/17. This am Hb 7.5 (plt's 141 yesterday and 125 today). There are no active signs of bleeding, urine is yellow and pt denies new flank or abd pain in fact has rested through-out the night. Of note pt did have a positive fecal occult blood card on 12/15/2017. RN reports pt had a small BM yesterday that was not reported to be bloody. Rx was notified by RN at approx 0200 this am and pharmacist Diona Browner) reports infusion rate decreased by 15%. I suspect some of this may be dilutional given late start on IV fluids d/t an ordering error. However remain concerned about possible occult bleed given admission Hb of 14 on 12/12/17. VSS (HR-79, BP-121/55). In the absence of objective s/s of bleeding or c/o new s/s by pt (and significance of large submassive PE and BLE DVT's) will repeat an H&H at 0500 this am. Low threshold to stop heparin if there is a continued drop in Hb over several hours. Will continue to monitor closely on telemetry.    Jeryl Columbia, NP-C [facility] Pager [phone]

## 2017-12-16 NOTE — Progress Notes (Addendum)
CRITICAL VALUE ALERT  Critical Value:  PTT 175  Date & Time Notied:  12/16/2017 1245  Provider Notified: Dr. Coralee Pesa, also discussed with [name], [facility]  Orders Received/Actions taken: stopped Heparin gtt for 1 hour, resume at a decreased rate after that hour and continue to monitor for signs of bleeding.  [name] [facility]

## 2017-12-16 NOTE — Care Management Note (Signed)
Case Management Note  Patient Details  Name: Hannah Pitts MRN: [id number] Date of Birth: 07/01/1946  Subjective/Objective:   Pt admitted with Pulmonary embolism, resp failure                 Action/Plan: Plan to discharge home with no needs at present time. Pt refused HH.    Expected Discharge Date:                  Expected Discharge Plan:  Home/Self Care  In-House Referral:     Discharge planning Services  CM Consult  Post Acute Care Choice:    Choice offered to:     DME Arranged:    DME Agency:     HH Arranged:  Patient Refused [place] Agency:     Status of Service:  Completed, signed off  If discussed at [employer] of Stay Meetings, dates discussed:    Additional CommentsPurcell Mouton, RN 12/16/2017, 12:17 PM

## 2017-12-17 ENCOUNTER — Encounter (HOSPITAL_COMMUNITY): Payer: Self-pay | Admitting: Interventional Radiology

## 2017-12-17 ENCOUNTER — Inpatient Hospital Stay (HOSPITAL_COMMUNITY): Payer: Medicare Other

## 2017-12-17 ENCOUNTER — Other Ambulatory Visit: Payer: Self-pay | Admitting: Oncology

## 2017-12-17 HISTORY — PX: IR IVC FILTER PLMT / S&I /IMG GUID/MOD SED: IMG701

## 2017-12-17 LAB — CBC
HCT: 19.3 % — ABNORMAL LOW (ref 36.0–46.0)
Hemoglobin: 6.4 g/dL — CL (ref 12.0–15.0)
MCH: 30.8 pg (ref 26.0–34.0)
MCHC: 33.2 g/dL (ref 30.0–36.0)
MCV: 92.8 fL (ref 78.0–100.0)
Platelets: 122 10*3/uL — ABNORMAL LOW (ref 150–400)
RBC: 2.08 MIL/uL — ABNORMAL LOW (ref 3.87–5.11)
RDW: 13.8 % (ref 11.5–15.5)
WBC: 9 10*3/uL (ref 4.0–10.5)

## 2017-12-17 LAB — BASIC METABOLIC PANEL
Anion gap: 5 (ref 5–15)
BUN: 34 mg/dL — ABNORMAL HIGH (ref 6–20)
CO2: 25 mmol/L (ref 22–32)
Calcium: 7.8 mg/dL — ABNORMAL LOW (ref 8.9–10.3)
Chloride: 111 mmol/L (ref 101–111)
Creatinine, Ser: 1.21 mg/dL — ABNORMAL HIGH (ref 0.44–1.00)
GFR calc Af Amer: 51 mL/min — ABNORMAL LOW (ref >60)
GFR calc non Af Amer: 44 mL/min — ABNORMAL LOW (ref >60)
Glucose, Bld: 89 mg/dL (ref 65–99)
Potassium: 3.9 mmol/L (ref 3.5–5.1)
Sodium: 141 mmol/L (ref 135–145)

## 2017-12-17 LAB — HEMOGLOBIN AND HEMATOCRIT, BLOOD
HCT: 22.2 % — ABNORMAL LOW (ref 36.0–46.0)
HCT: 26 % — ABNORMAL LOW (ref 36.0–46.0)
HEMATOCRIT: 19.9 % — AB (ref 36.0–46.0)
HEMOGLOBIN: 6.5 g/dL — AB (ref 12.0–15.0)
Hemoglobin: 7.3 g/dL — ABNORMAL LOW (ref 12.0–15.0)
Hemoglobin: 8.5 g/dL — ABNORMAL LOW (ref 12.0–15.0)

## 2017-12-17 LAB — PREPARE RBC (CROSSMATCH)

## 2017-12-17 LAB — ABO/RH: ABO/RH(D): B POS

## 2017-12-17 MED ORDER — MIDAZOLAM HCL 2 MG/2ML IJ SOLN
INTRAMUSCULAR | Status: AC
  Filled 2017-12-17: qty 4

## 2017-12-17 MED ORDER — FENTANYL CITRATE (PF) 100 MCG/2ML IJ SOLN
INTRAMUSCULAR | Status: AC | PRN
  Administered 2017-12-17 (×2): 50 ug via INTRAVENOUS

## 2017-12-17 MED ORDER — SODIUM CHLORIDE 0.9 % IV SOLN: Freq: Once | INTRAVENOUS | Status: AC

## 2017-12-17 MED ORDER — IOPAMIDOL (ISOVUE-300) INJECTION 61%
50.0000 mL | Freq: Once | INTRAVENOUS | Status: AC | PRN
  Administered 2017-12-17: 40 mL via INTRAVENOUS

## 2017-12-17 MED ORDER — SODIUM CHLORIDE 0.9 % IV SOLN
Freq: Once | INTRAVENOUS | Status: AC
Start: 2017-12-17 — End: 2017-12-17
  Administered 2017-12-17: 10:00:00 via INTRAVENOUS

## 2017-12-17 MED ORDER — LIDOCAINE HCL 1 % IJ SOLN
INTRAMUSCULAR | Status: AC | PRN
  Administered 2017-12-17: 10 mL

## 2017-12-17 MED ORDER — DIPHENHYDRAMINE HCL 25 MG PO CAPS
25.0000 mg | ORAL_CAPSULE | Freq: Once | ORAL | Status: AC
  Administered 2017-12-17: 25 mg via ORAL
  Filled 2017-12-17: qty 1

## 2017-12-17 MED ORDER — IOPAMIDOL (ISOVUE-300) INJECTION 61%
INTRAVENOUS | Status: AC
  Administered 2017-12-17: 40 mL via INTRAVENOUS
  Filled 2017-12-17: qty 50

## 2017-12-17 MED ORDER — FENTANYL CITRATE (PF) 100 MCG/2ML IJ SOLN
INTRAMUSCULAR | Status: AC
  Filled 2017-12-17: qty 2

## 2017-12-17 MED ORDER — MIDAZOLAM HCL 2 MG/2ML IJ SOLN
INTRAMUSCULAR | Status: AC | PRN
  Administered 2017-12-17 (×2): 1 mg via INTRAVENOUS

## 2017-12-17 NOTE — Progress Notes (Signed)
CRITICAL VALUE ALERT  Critical Value:  Hgb 6.5  Date & Time Notied:  12/17/2017 @ 0827  Provider Notified: Dr. Rodena Piety  Orders Received/Actions taken: orders already in place to transfuse 2 units of PRBC. Will start transfusion as soon as possible.  [name] [facility]

## 2017-12-17 NOTE — Progress Notes (Signed)
CRITICAL VALUE ALERT  Critical Value:  Hgb 6.4  Date & Time Notied:  12/17/18 0450  Provider Notified: [place]  Orders Received/Actions taken: New orders placed

## 2017-12-17 NOTE — Consult Note (Signed)
Chief Complaint: Patient was seen in consultation today for IVC filter placement Chief Complaint  Patient presents with  . Shortness of Breath    Referring Physician(s): Matthews,E  Supervising Physician: Sandi Mariscal  Patient Status: [facility] - In-pt  History of Present Illness: Hannah Pitts is a 71 y.o. female with history of Sjogren's syndrome, lupus, obesity who was recently admitted to [facility] with increasing dyspnea and subsequently found to have right pulmonary emboli.  She was subsequently placed on IV heparin as well as Xarelto for short-term however dropped her hemoglobin, developed abdominal pain and subsequent CT revealed large left rectus abdominis hematoma.  Bilateral lower extremity venous Dopplers were also positive for bilateral DVTs.  Request now received for IVC filter placement.  Past Medical History:  Diagnosis Date  . Abnormal involuntary movements(781.0)   . Anemia of other chronic disease   . Depressive disorder, not elsewhere classified   . Diaphragmatic hernia without mention of obstruction or gangrene   . Dysthymic disorder   . Generalized hyperhidrosis   . GERD (gastroesophageal reflux disease)   . H/O hiatal hernia   . Lymphoma ([place]) 03/2010   "they got it all w/surgery"  . Meniere's disease of left ear   . Migraine    "used to have classic kind; not anymore; last one was in 2012"  . Other rheumatoid arthritis with visceral or systemic involvement   . Palpitations   . Personal history of other disorders of nervous system and sense organs   . Systemic lupus erythematosus ([place])   . Unspecified disease of pericardium    Pericarditis   . Unspecified hypothyroidism   . Urticaria, unspecified     Past Surgical History:  Procedure Laterality Date  . ABDOMINAL HYSTERECTOMY  1978  . APPENDECTOMY  1974  . BREAST BIOPSY  03/2010   left breast;   . BREAST LUMPECTOMY  03/2010   left breast; nonHodgkins lymphoma; "not breast cancer"  .  CHOLECYSTECTOMY  1974  . ESOPHAGEAL DILATION  ?02/2011  . ESOPHAGOGASTRODUODENOSCOPY  01-24-11   Slight stenosis esoph spasm  . Left VATS  12/1997  . [place]  . shunt     put in behind left ear ; Menier's syndrome    Allergies: Patient has no known allergies.  Medications: Prior to Admission medications   Medication Sig Start Date End Date Taking? Authorizing Provider  acetaminophen (TYLENOL) 325 MG tablet Take 650 mg by mouth every 4 (four) hours as needed for mild pain.    Yes [provider]  aspirin (ASPIRIN LOW DOSE) 81 MG EC tablet Take 81 mg by mouth daily.     Yes [provider]  Cholecalciferol (VITAMIN D) 1000 UNITS capsule Take 1,000 Units by mouth daily.    Yes [provider]  escitalopram (LEXAPRO) 10 MG tablet Take 1 tablet (10 mg total) by mouth daily. 07/08/17  Yes Tonia Ghent, MD  hydroxychloroquine (PLAQUENIL) 200 MG tablet Take 200 mg by mouth 2 (two) times daily.    Yes [provider]  levothyroxine (SYNTHROID, LEVOTHROID) 112 MCG tablet Take 1 tablet (112 mcg total) by mouth daily. Except for 1/2 tab on Sun Patient taking differently: Take 56-112 mcg by mouth daily. Take 1 tablet daily except for 1/2 tablet on Sundays 04/29/17  Yes Tonia Ghent, MD  loratadine (CLARITIN) 10 MG tablet Take 1 tablet (10 mg total) by mouth daily. 08/05/16  Yes Tonia Ghent, MD  metoprolol succinate (TOPROL-XL)  50 MG 24 hr tablet Take 1 tablet (50 mg total) by mouth daily. Take with or immediately following a meal. 04/28/17  Yes Tonia Ghent, MD  predniSONE (DELTASONE) 5 MG tablet Take 5 mg by mouth 2 (two) times daily with a meal.    Yes [provider]  ranitidine (ZANTAC) 150 MG tablet Take 1 tablet (150 mg total) by mouth 2 (two) times daily. Patient taking differently: Take 150 mg by mouth daily as needed for heartburn.  08/05/16  Yes Tonia Ghent, MD     Family History  Problem Relation Age of Onset  .  Heart failure Mother   . Stroke Father   . Coronary artery disease Sister   . COPD Sister   . Coronary artery disease Brother   . Diabetes Brother   . Heart failure Brother   . Coronary artery disease Sister   . Coronary artery disease Sister   . Cancer Sister   . Heart failure Sister   . Cancer Sister        lung, esoph  . Cancer Sister        lung  . Breast cancer Maternal Aunt   . Colon cancer Neg Hx     Social History   Socioeconomic History  . Marital status: Widowed    Spouse name: None  . Number of children: 2  . Years of education: None  . Highest education level: None  Social Needs  . Financial resource strain: None  . Food insecurity - worry: None  . Food insecurity - inability: None  . Transportation needs - medical: None  . Transportation needs - non-medical: None  Occupational History  . Occupation: Disabled     Employer: RETIRED  Tobacco Use  . Smoking status: Never Smoker  . Smokeless tobacco: Never Used  Substance and Sexual Activity  . Alcohol use: No  . Drug use: No  . Sexual activity: No  Other Topics Concern  . None  Social History Narrative   Ms. [name] lives in [place], [place].    She is widowed with 2 daughters - she lives with one of her daughters.    She worked as a housewife and later in Psychologist, educational on an Designer, television/film set.       Review of Systems patient currently denies headache, chest pain, nausea/ vomiting.  She does have mild temperature elevation, some dyspnea when lying flat, occasional cough, abdominal primary left-sided and back pain.  Vital Signs: BP (!) 116/55   Pulse 79   Temp 99.4 F (37.4 C) (Oral)   Resp 18   Ht 5\' 5"  (1.651 m)   Wt 242 lb 8.1 oz (110 kg)   SpO2 100%   BMI 40.36 kg/m   Physical Exam awake, hard of hearing; chest clear to auscultation bilaterally.  Heart with regular rate and rhythm.  Abdomen obese, soft, mildly tender left rectus sheath region secondary to hematoma; trace left lower extremity  edema, no right lower extremity edema.  Imaging: Ct Abdomen Pelvis Wo Contrast  Result Date: 12/16/2017 CLINICAL DATA:  71 y/o F; history of Sjogren's, lupus, pulmonary embolus, an acute kidney injury. Abdominal distention. EXAM: CT ABDOMEN AND PELVIS WITHOUT CONTRAST TECHNIQUE: Multidetector CT imaging of the abdomen and pelvis was performed following the standard protocol without IV contrast. COMPARISON:  None. FINDINGS: Lower chest: Left lower lobe platelike opacity, likely atelectasis. Hepatobiliary: Hepatic steatosis. Cholecystectomy. No biliary ductal dilatation. Pancreas: Unremarkable. No pancreatic ductal dilatation or surrounding inflammatory changes. Spleen: Normal  in size without focal abnormality. Adrenals/Urinary Tract: Small bilateral renal cysts. Normal adrenal glands. No hydronephrosis. Normal bladder. Stomach/Bowel: Stomach is within normal limits. Mild sigmoid colon diverticulosis. No evidence of bowel wall thickening, distention, or inflammatory changes. Appendix not identified, no pericecal inflammation. Vascular/Lymphatic: Aortic atherosclerosis. No enlarged abdominal or pelvic lymph nodes. Reproductive: Status post hysterectomy. No adnexal masses. Other: No abdominal wall hernia or abnormality. No abdominopelvic ascites. Musculoskeletal: Large multicentric left rectus abdominis hematoma extending from the left paraumbilical region to the symphysis pubis. IMPRESSION: 1. Large multicentric left rectus abdominis hematoma extending from left paraumbilical region the symphysis pubis. 2. Hepatic steatosis. 3. Mild sigmoid diverticulosis. 4. Aortic atherosclerosis. These results will be called to the ordering clinician or representative by the Radiologist Assistant, and communication documented in the PACS or zVision Dashboard. Electronically Signed   By: Kristine Garbe M.D.   On: 12/16/2017 19:53   Dg Chest 2 View  Result Date: 12/12/2017 CLINICAL DATA:  Acute onset of cough,  shortness of breath, fatigue, fever and chills. EXAM: CHEST  2 VIEW COMPARISON:  Chest radiograph performed 07/03/2017 FINDINGS: The lungs are well-aerated and clear. There is no evidence of focal opacification, pleural effusion or pneumothorax. The heart is borderline normal in size. No acute osseous abnormalities are seen. IMPRESSION: No acute cardiopulmonary process seen. Electronically Signed   By: Garald Balding M.D.   On: 12/12/2017 05:38   Ct Angio Chest Pe W And/or Wo Contrast  Result Date: 12/12/2017 CLINICAL DATA:  Shortness of Breath EXAM: CT ANGIOGRAPHY CHEST WITH CONTRAST TECHNIQUE: Multidetector CT imaging of the chest was performed using the standard protocol during bolus administration of intravenous contrast. Multiplanar CT image reconstructions and MIPs were obtained to evaluate the vascular anatomy. CONTRAST:  40mL [id number] COMPARISON:  Chest x-ray from earlier in the same day. FINDINGS: Cardiovascular: The thoracic aorta demonstrates atherosclerotic calcifications without evidence of dissection. No significant and this aneurysmal dilatation is seen. Pulmonary artery is well visualized and demonstrates multiple filling defects primarily within the left lower lobe consistent with pulmonary emboli. There is elevation of the RV/LV ratio to 1. Mediastinum/Nodes: No hilar or mediastinal adenopathy is noted. The thoracic inlet is within normal limits. The esophagus is unremarkable. Lungs/Pleura: Lungs are well aerated bilaterally with evidence of mild bibasilar atelectasis. No focal confluent infiltrate is seen. No sizable effusion is noted. No pneumothorax is seen. No pulmonary non is noted. Upper Abdomen: Changes consistent with prior cholecystectomy. Mild fatty infiltration of the liver is seen. Mild renal cystic changes are noted. Musculoskeletal: Mild degenerative changes of thoracic spine are seen. Review of the MIP images confirms the above findings. IMPRESSION: Positive for acute PE with  CT evidence of right heart strain (RV/LV Ratio = 1) consistent with at least submassive (intermediate risk) PE. The presence of right heart strain has been associated with an increased risk of morbidity and mortality. Please activate Code PE by paging [phone]. Critical Value/emergent results were called by telephone at the time of interpretation on 12/12/2017 at 9:15 am to Dr. Lita Mains, who verbally acknowledged these results. Aortic Atherosclerosis (ICD10-I70.0). Electronically Signed   By: Inez Catalina M.D.   On: 12/12/2017 09:17   US Renal  Result Date: 12/14/2017 CLINICAL DATA:  Acute kidney injury. EXAM: RENAL / URINARY TRACT ULTRASOUND COMPLETE COMPARISON:  Included portion from chest CT 12/12/2017 FINDINGS: Right Kidney: Length: 8.8 cm. Diffuse thinning of renal parenchyma. Echogenicity within normal limits. No mass or hydronephrosis visualized. Cyst in the upper right kidney on prior CT is  not visualize sonographically. Left Kidney: Length: 8.9 cm. Diffuse thinning of renal parenchyma. Echogenicity within normal limits. No mass or hydronephrosis visualized. Bladder: Only minimally distended. Appears normal for degree of bladder distention. IMPRESSION: Renal atrophy.  No hydronephrosis. Electronically Signed   By: Jeb Levering M.D.   On: 12/14/2017 21:48   Dg Chest Port 1 View  Result Date: 12/17/2017 CLINICAL DATA:  Hypoxia EXAM: PORTABLE CHEST 1 VIEW COMPARISON:  12/12/2017 FINDINGS: Left ventricular prominence. Aortic atherosclerosis and unfolding. Upper lungs are clear. Minimal basilar atelectasis. No change since previous study. IMPRESSION: Minimal basilar atelectasis. Cardiomegaly and aortic atherosclerosis. Electronically Signed   By: Nelson Chimes M.D.   On: 12/17/2017 07:51    Labs:  CBC: Recent Labs    12/14/17 0329 12/15/17 0537 12/16/17 0106 12/16/17 0453 12/17/17 0405 12/17/17 0805  WBC 21.2* 11.5* 9.4  --  9.0  --   HGB 12.2 9.5* 7.6* 8.1* 6.4* 6.5*  HCT 37.7  29.3* 22.8* 24.3* 19.3* 19.9*  PLT 180 141* 125*  --  122*  --     COAGS: Recent Labs    12/12/17 1832 12/13/17 0344  12/15/17 1406 12/16/17 0106 12/16/17 1057 12/16/17 2201  INR 1.10 1.15  --   --   --   --   --   APTT 24 62*   < > 136* 148* 175* 89*   < > = values in this interval not displayed.    BMP: Recent Labs    12/14/17 0329 12/15/17 0537 12/16/17 0106 12/17/17 0405  NA 139 138 135 141  K 3.6 3.6 3.7 3.9  CL 101 101 101 111  CO2 25 27 26 25   GLUCOSE 147* 92 96 89  BUN 42* 57* 51* 34*  CALCIUM 8.7* 8.7* 7.8* 7.8*  CREATININE 2.35* 3.09* 1.87* 1.21*  GFRNONAA 20* 14* 26* 44*  GFRAA 23* 16* 30* 51*    LIVER FUNCTION TESTS: Recent Labs    04/28/17 1056 12/12/17 0549  BILITOT 0.4 1.1  AST 28 39  ALT 16 25  ALKPHOS 78 70  PROT 7.1 6.8  ALBUMIN 4.0 3.6    TUMOR MARKERS: No results for input(s): AFPTM, CEA, CA199, CHROMGRNA in the last 8760 hours.  Assessment and Plan:  71 y.o. female with history of Sjogren's syndrome, lupus, obesity who was recently admitted to [facility] with increasing dyspnea and subsequently found to have right pulmonary emboli.  She was subsequently placed on IV heparin as well as Xarelto for short-term however dropped her hemoglobin, developed abdominal pain and subsequent CT revealed large left rectus abdominis hematoma.  Bilateral lower extremity venous Dopplers were also positive for bilateral DVTs.  Request now received for IVC filter placement.  The case has been reviewed by Dr. Pascal Lux.Risks and benefits discussed with the patient/daughter  including, but not limited to bleeding, infection, contrast induced renal failure, filter fracture or migration which can lead to emergency surgery or even death, strut penetration with damage or irritation to adjacent structures and caval thrombosis.All of the patient's questions were answered, patient is agreeable to proceed. Consent signed and in chart.  Current labs include WBC 9.0,  hemoglobin 6.5-currently being transfused, platelets 122k, creatinine 1.21-may necessitate use of CO2., PT 14.6, INR 1.15.      Thank you for this interesting consult.  I greatly enjoyed meeting Hannah Pitts and look forward to participating in their care.  A copy of this report was sent to the requesting provider on this date.  Electronically Signed:  D. Rowe Robert, PA-C 12/17/2017, 12:36 PM   I spent a total of 30 minutes    in face to face in clinical consultation, greater than 50% of which was counseling/coordinating care for IVC filter placement

## 2017-12-17 NOTE — Progress Notes (Signed)
PROGRESS NOTE    Hannah Pitts  NKN:[id number] DOB: Sep 07, 1946 DOA: 12/12/2017 PCP: Tonia Ghent, MD Brief Narrative: 71 year old female with history of Sjogren's and lupus on hydroxychloroquine, obesity who has had some shortness of breath for the last 5-6 months. She became acutely more dyspneic over the last 2 days and presented to the emergency department where she was found to have submassive right pulmonary emboli with evidence of right heart strain. Her initial troponin was minimally elevated. She was started on a heparin drip and admitted to the stepdown unit. Lower extremity duplex demonstrated bilateral DVTs and echocardiogram demonstrated grade 1 diastolic dysfunction, preserved ejection fraction of 60-65%. Right ventricular systolic function was normal. There was trivial tricuspid valve regurgitation and pulmonary arterial pressure cannot be accurately estimated. It does not appear that she has significant right heart strain secondary to her submassive PE. Initially, she was transitioned over to Xarelto, however this morning it appears she has some acute kidney injury. Her Xarelto has been discontinued and she has been restarted on a heparin drip pending further investigation and treatment of her AKI.To an ordering error, her IV fluids were not started yesterday. I suspect that this is prerenal after she was diuresed when she first came to the emergency department wheezing. Her renal ultrasound demonstrates no evidence of obstruction. Her urinalysis was bland, inconsistent with lupus nephritis or ischemic kidney. She has been started on IV fluids and we will repeat her creatinine tomorrow morning. If her creatinine continues to rise despite IV fluids, nephrology will be consulted..   12/20- ct abdomen-Large multicentric left rectus abdominis hematoma extending from left paraumbilical region the symphysis pubis. 2. Hepatic steatosis. 3. Mild sigmoid diverticulosis. 4.  Aortic atherosclerosis. Assessment & Plan:   Active Problems:   Debility   COPD with acute bronchitis (HCC)   Pulmonary embolism (HCC)   Elevated troponin   Congestive heart failure (CHF) (HCC)   Obesity hypoventilation syndrome (HCC)  Acute respiratory failure with hypoxia secondary to acute submassive pulmonary embolism/bilateral lower extremity DVT-patient was started on heparin drip but her hemoglobin started dropping along with newly diagnosed left rectus sheath hematoma.  Appreciate assistance overnight from nurse practitioner [facility].  Heparin has been stopped at this time an IVC filter placement has been ordered to be done by IR.  This was discussed in detail with patient and patient's daughter.  Patient's daughter also reported that patient wants to be DO NOT RESUSCITATE.  Acute on chronic CKD stage III renal functions improved with IV hydration.   DVT prophylaxis scd Code Status DNR :Family Communication discussed with daughter Disposition Plan: TBD  Consultants:  interventional radiology  Procedures: None Antimicrobials None Subjective: Reports abdominal pain is better no nausea vomiting diarrhea reported   Objective: Patient resting in bed in no acute distress Vitals:   12/17/17 0915 12/17/17 0935 12/17/17 1050 12/17/17 1115  BP:  (!) 149/66 (!) 121/46 (!) 116/55  Pulse:  91 78 79  Resp:  18 18 18   Temp:  99 F (37.2 C) 99.5 F (37.5 C) 99.4 F (37.4 C)  TempSrc:  Oral Oral Oral  SpO2: 96% 100% 100% 100%  Weight:      Height:        Intake/Output Summary (Last 24 hours) at 12/17/2017 1132 Last data filed at 12/17/2017 1052 Gross per 24 hour  Intake 3691.7 ml  Output 2075 ml  Net 1616.7 ml   Filed Weights   12/12/17 0445 12/15/17 1357  Weight: 108.9 kg (240 lb)  110 kg (242 lb 8.1 oz)    Examination:  General exam: Appears calm and comfortable  Respiratory system: Clear to auscultation. Respiratory effort normal. Cardiovascular system: S1  & S2 heard, RRR. No JVD, murmurs, rubs, gallops or clicks. No pedal edema. Gastrointestinal system: Abdomen is nondistended, soft and tender. No organomegaly or masses felt. Normal bowel sounds heard. Central nervous system: Alert and oriented. No focal neurological deficits. Extremities: Symmetric 5 x 5 power. Skin: No rashes, lesions or ulcers Psychiatry: Judgement and insight appear normal. Mood & affect appropriate.     Data Reviewed: I have personally reviewed following labs and imaging studies  CBC: Recent Labs  Lab 12/13/17 0344 12/14/17 0329 12/15/17 0537 12/16/17 0106 12/16/17 0453 12/17/17 0405 12/17/17 0805  WBC 12.3* 21.2* 11.5* 9.4  --  9.0  --   HGB 12.9 12.2 9.5* 7.6* 8.1* 6.4* 6.5*  HCT 40.5 37.7 29.3* 22.8* 24.3* 19.3* 19.9*  MCV 93.8 93.5 92.4 91.2  --  92.8  --   PLT 116* 180 141* 125*  --  122*  --    Basic Metabolic Panel: Recent Labs  Lab 12/12/17 0549 12/12/17 1832 12/14/17 0329 12/15/17 0537 12/16/17 0106 12/17/17 0405  NA 142  --  139 138 135 141  K 3.1*  --  3.6 3.6 3.7 3.9  CL 104  --  101 101 101 111  CO2 25  --  25 27 26 25   GLUCOSE 132*  --  147* 92 96 89  BUN 19  --  42* 57* 51* 34*  CREATININE 1.38*  --  2.35* 3.09* 1.87* 1.21*  CALCIUM 8.7*  --  8.7* 8.7* 7.8* 7.8*  MG  --  1.5*  --   --   --   --    GFR: Estimated Creatinine Clearance: 51.9 mL/min (A) (by C-G formula based on SCr of 1.21 mg/dL (H)). Liver Function Tests: Recent Labs  Lab 12/12/17 0549  AST 39  ALT 25  ALKPHOS 70  BILITOT 1.1  PROT 6.8  ALBUMIN 3.6   No results for input(s): LIPASE, AMYLASE in the last 168 hours. No results for input(s): AMMONIA in the last 168 hours. Coagulation Profile: Recent Labs  Lab 12/12/17 1832 12/13/17 0344  INR 1.10 1.15   Cardiac Enzymes: Recent Labs  Lab 12/12/17 0549 12/12/17 1832 12/12/17 2245 12/13/17 0344  TROPONINI 0.10* 0.17* 0.19* 0.14*   BNP (last 3 results) No results for input(s): PROBNP in the last  8760 hours. HbA1C: No results for input(s): HGBA1C in the last 72 hours. CBG: No results for input(s): GLUCAP in the last 168 hours. Lipid Profile: No results for input(s): CHOL, HDL, LDLCALC, TRIG, CHOLHDL, LDLDIRECT in the last 72 hours. Thyroid Function Tests: No results for input(s): TSH, T4TOTAL, FREET4, T3FREE, THYROIDAB in the last 72 hours. Anemia Panel: Recent Labs    12/16/17 0106  VITAMINB12 743  FOLATE 22.0  FERRITIN 28  TIBC 232*  IRON 18*   Sepsis Labs: No results for input(s): PROCALCITON, LATICACIDVEN in the last 168 hours.  Recent Results (from the past 240 hour(s))  MRSA PCR Screening     Status: None   Collection Time: 12/12/17  5:14 PM  Result Value Ref Range Status   MRSA by PCR NEGATIVE NEGATIVE Final    Comment:        The GeneXpert MRSA Assay (FDA approved for NASAL specimens only), is one component of a comprehensive MRSA colonization surveillance program. It is not intended to diagnose MRSA infection  nor to guide or monitor treatment for MRSA infections.          Radiology Studies: Ct Abdomen Pelvis Wo Contrast  Result Date: 12/16/2017 CLINICAL DATA:  71 y/o F; history of Sjogren's, lupus, pulmonary embolus, an acute kidney injury. Abdominal distention. EXAM: CT ABDOMEN AND PELVIS WITHOUT CONTRAST TECHNIQUE: Multidetector CT imaging of the abdomen and pelvis was performed following the standard protocol without IV contrast. COMPARISON:  None. FINDINGS: Lower chest: Left lower lobe platelike opacity, likely atelectasis. Hepatobiliary: Hepatic steatosis. Cholecystectomy. No biliary ductal dilatation. Pancreas: Unremarkable. No pancreatic ductal dilatation or surrounding inflammatory changes. Spleen: Normal in size without focal abnormality. Adrenals/Urinary Tract: Small bilateral renal cysts. Normal adrenal glands. No hydronephrosis. Normal bladder. Stomach/Bowel: Stomach is within normal limits. Mild sigmoid colon diverticulosis. No evidence of  bowel wall thickening, distention, or inflammatory changes. Appendix not identified, no pericecal inflammation. Vascular/Lymphatic: Aortic atherosclerosis. No enlarged abdominal or pelvic lymph nodes. Reproductive: Status post hysterectomy. No adnexal masses. Other: No abdominal wall hernia or abnormality. No abdominopelvic ascites. Musculoskeletal: Large multicentric left rectus abdominis hematoma extending from the left paraumbilical region to the symphysis pubis. IMPRESSION: 1. Large multicentric left rectus abdominis hematoma extending from left paraumbilical region the symphysis pubis. 2. Hepatic steatosis. 3. Mild sigmoid diverticulosis. 4. Aortic atherosclerosis. These results will be called to the ordering clinician or representative by the Radiologist Assistant, and communication documented in the PACS or zVision Dashboard. Electronically Signed   By: Kristine Garbe M.D.   On: 12/16/2017 19:53   Dg Chest Port 1 View  Result Date: 12/17/2017 CLINICAL DATA:  Hypoxia EXAM: PORTABLE CHEST 1 VIEW COMPARISON:  12/12/2017 FINDINGS: Left ventricular prominence. Aortic atherosclerosis and unfolding. Upper lungs are clear. Minimal basilar atelectasis. No change since previous study. IMPRESSION: Minimal basilar atelectasis. Cardiomegaly and aortic atherosclerosis. Electronically Signed   By: Nelson Chimes M.D.   On: 12/17/2017 07:51        Scheduled Meds: . docusate sodium  100 mg Oral BID  . escitalopram  10 mg Oral Daily  . hydroxychloroquine  200 mg Oral BID  . ipratropium-albuterol  3 mL Nebulization TID  . levothyroxine  112 mcg Oral Once per day on Mon Tue Wed Thu Fri Sat  . levothyroxine  56 mcg Oral Q Sun  . mouth rinse  15 mL Mouth Rinse BID  . metoprolol tartrate  12.5 mg Oral BID  . predniSONE  5 mg Oral BID WC   Continuous Infusions: . sodium chloride 125 mL/hr at 12/16/17 2328     LOS: 5 days       Georgette Shell, MD [facility]  If 7PM-7AM, please  contact night-coverage [email or web address] Password [facility] 12/17/2017, 11:32 AM

## 2017-12-17 NOTE — Progress Notes (Signed)
Reviewed results of CT abdomen from 12/19. Results showed large multicentric left rectus abdominis hematoma extending from left paraumbilical region the symphysis pubis. Pt currently on heparin drip at 9 ml/hr. Pharmacist and NP on call notified. NP placed order to D/C heparin drip. Heparin drip D/C'd per order. Patient c/o mild tenderness in LLQ. VSS. No bleeding noted.   Per Network engineer the radiology assistant called the front desk and asked the secretary for a fax number and name of the nurse taking care of the patient. The secretary asked the radiology assistant if the call needs to be transferred to the nurse, the radiology assistant than stated no. No information was passed on to the nurse. Will continue to monitor the patient closely.

## 2017-12-17 NOTE — Progress Notes (Signed)
MEDICATION-RELATED CONSULT NOTE   IR Procedure Consult - Anticoagulant/Antiplatelet PTA/Inpatient Med List Review by Pharmacist    Procedure: placement of an infrarenal IVC filter via the R IJ    Completed: 12/201/18 at ~1546  Post-Procedural bleeding risk per IR MD assessment:  standard  Antithrombotic medications on inpatient or PTA profile prior to procedure:    New PE/DVT but not currently on anticoag. d/t left rectus sheath hematoma.    Plan:     - no action is needed at this time - pharmacy will sign off  Dia Sitter, PharmD, BCPS 12/17/2017 4:40 PM

## 2017-12-17 NOTE — Progress Notes (Addendum)
Shift event note: Notified at approx 0330 by RN regarding CT abd/pelvis from late yesterday afternoon. The Ct revealed a large multicentric left rectus abdominis hematoma extending from the left paraumbilical region to the symphysis pubis. The radiologist documented at approx 1953 that ordering MD (or her representative) would be notified of these results. However,  it remains unclear who was notified. The pt has rested w/o c/o. On assessment she has a soft abd, + bs and some mild (L) periumbilical TTP (per RN). There have been no objective s/s bleeding. HR- 79, R-18, 02 sats 100% on 2L [place], BP 138/67 and pt remains afebrile. Stat CBC/Bmet pending. Assessment/Plan:  1. Large abdominal hematoma: In pt on heparin drip. Heparin drip has been d/c'd. Rx notified. Discussed pt w/ Dr Hassell Done w/ general surgery service who agree's w/ stopping Heparin. He recommends complete bedrest for now and NPO status. Further recommends rounding MD discuss w/ pt the possibility of placing IVC filter. Will continue to monitor closely on telemetry.  Jeryl Columbia, NP-C [facility] Pager [phone]  0500: Notified of Hb 6.4. Have placed orders for pt to receive 2 units PRBC's now and for blood bank to keep 2 units ahead. VS remain stable. Will continue to monitor closely w/ low threshold to transfer to SDU as indicated.

## 2017-12-17 NOTE — Procedures (Signed)
Pre procedural Dx: DVT and PE, poorly tolerating anti-coagulation Post Procedural Dx: Same  Successful placement of an infrarenal IVC filter via the R IJ.  EBL: Trace  Complications: None immediate  Ronny Bacon, MD Pager #: [phone]

## 2017-12-18 LAB — BASIC METABOLIC PANEL
ANION GAP: 5 (ref 5–15)
BUN: 29 mg/dL — ABNORMAL HIGH (ref 6–20)
CHLORIDE: 109 mmol/L (ref 101–111)
CO2: 26 mmol/L (ref 22–32)
Calcium: 8.1 mg/dL — ABNORMAL LOW (ref 8.9–10.3)
Creatinine, Ser: 0.96 mg/dL (ref 0.44–1.00)
GFR calc non Af Amer: 58 mL/min — ABNORMAL LOW (ref >60)
Glucose, Bld: 91 mg/dL (ref 65–99)
POTASSIUM: 3.9 mmol/L (ref 3.5–5.1)
Sodium: 140 mmol/L (ref 135–145)

## 2017-12-18 LAB — CBC
HCT: 25.8 % — ABNORMAL LOW (ref 36.0–46.0)
HEMOGLOBIN: 8.5 g/dL — AB (ref 12.0–15.0)
MCH: 29.5 pg (ref 26.0–34.0)
MCHC: 32.9 g/dL (ref 30.0–36.0)
MCV: 89.6 fL (ref 78.0–100.0)
Platelets: 129 10*3/uL — ABNORMAL LOW (ref 150–400)
RBC: 2.88 MIL/uL — AB (ref 3.87–5.11)
RDW: 14.4 % (ref 11.5–15.5)
WBC: 7.9 10*3/uL (ref 4.0–10.5)

## 2017-12-18 LAB — HEMOGLOBIN AND HEMATOCRIT, BLOOD
HCT: 26.7 % — ABNORMAL LOW (ref 36.0–46.0)
HCT: 28.7 % — ABNORMAL LOW (ref 36.0–46.0)
HEMATOCRIT: 27.4 % — AB (ref 36.0–46.0)
HEMOGLOBIN: 8.8 g/dL — AB (ref 12.0–15.0)
HEMOGLOBIN: 9.4 g/dL — AB (ref 12.0–15.0)
Hemoglobin: 8.7 g/dL — ABNORMAL LOW (ref 12.0–15.0)

## 2017-12-18 LAB — TYPE AND SCREEN
ABO/RH(D): B POS
Antibody Screen: NEGATIVE
Unit division: 0
Unit division: 0

## 2017-12-18 LAB — BPAM RBC
Blood Product Expiration Date: 201901242359
Blood Product Expiration Date: 201901242359
ISSUE DATE / TIME: 201812201042
ISSUE DATE / TIME: 201812201716
Unit Type and Rh: 7300
Unit Type and Rh: 7300

## 2017-12-18 NOTE — NC FL2 (Signed)
[place] MEDICAID FL2 LEVEL OF CARE SCREENING TOOL     IDENTIFICATION  Patient Name: Hannah Pitts Birthdate: 05-Jan-1946 Sex: female Admission Date (Current Location): 12/12/2017  [facility] and [place] Number:  Engineering geologist and Address:  [facility],  [address] [place], [place]      Provider Number: [id number]  Attending Physician Name and Address:  Georgette Shell, MD  Relative Name and Phone Number:       Current Level of Care: Hospital Recommended Level of Care: [place] Prior Approval Number:    Date Approved/Denied:   PASRR Number:    Discharge Plan: SNF    Current Diagnoses: Patient Active Problem List   Diagnosis Date Noted  . Pulmonary embolism ([place]) 12/12/2017  . Elevated troponin 12/12/2017  . Congestive heart failure (CHF) ([place]) 12/12/2017  . Obesity hypoventilation syndrome ([place]) 12/12/2017  . Hives 08/06/2016  . Fall at home 08/05/2016  . Trochanteric bursitis 05/29/2015  . Muscle cramp 05/29/2015  . COPD with acute bronchitis ([place]) 12/12/2014  . Hearing loss 12/12/2014  . Low back pain 10/11/2014  . Advance care planning 07/31/2014  . Abnormality of gait 07/31/2014  . Dyspnea on exertion 07/20/2013  . Obesity 07/20/2013  . Debility 05/20/2013  . Medicare annual wellness visit, subsequent 07/27/2012  . Palpitations 03/23/2012  . Anxiety 06/10/2011  . RASH AND OTHER NONSPECIFIC SKIN ERUPTION 09/13/2010  . NON-HODGKIN'S LYMPHOMA, B-CELL OF BREAST 04/05/2010  . Dysphagia 03/12/2010  . LUPUS 09/25/2009  . SOB (shortness of breath) 03/28/2009  . SKIN LESION 09/21/2007  . Tremor 05/28/2007  . Hypothyroidism 05/27/2007  . ANEMIA OF CHRONIC DISEASE 05/27/2007  . PERICARDITIS 05/27/2007  . PERIPHERAL VASCULAR DISEASE 05/27/2007  . GERD 05/27/2007  . HIATAL HERNIA 05/27/2007  . ARTHRITIS, RHEUMATOID, SYSTEMIC NEC 05/27/2007  . MENIERE'S DISEASE, HX OF 05/27/2007    Orientation RESPIRATION  BLADDER Height & Weight     Self, Situation, Place, Time  O2 Incontinent Weight: 242 lb 8.1 oz (110 kg) Height:  5\' 5"  (165.1 cm)  BEHAVIORAL SYMPTOMS/MOOD NEUROLOGICAL BOWEL NUTRITION STATUS      Continent Diet(Heart)  AMBULATORY STATUS COMMUNICATION OF NEEDS Skin   Extensive Assist Verbally Other (Comment)(Incision(Closed)NeckRight Dressing Type: Pressure dressing changed 2x/day)                       Personal Care Assistance Level of Assistance  Bathing, Feeding, Dressing Bathing Assistance: Maximum assistance Feeding assistance: Independent Dressing Assistance: Maximum assistance     Functional Limitations Info  Sight, Hearing, Speech Sight Info: Adequate Hearing Info: Adequate Speech Info: Adequate    SPECIAL CARE FACTORS FREQUENCY  PT (By licensed PT), OT (By licensed OT)     PT Frequency: 5x OT Frequency: 5x            Contractures Contractures Info: Not present    Additional Factors Info  Code Status, Allergies Code Status Info: DNR Allergies Info: NKA           Current Medications (12/18/2017):  This is the current hospital active medication list Current Facility-Administered Medications  Medication Dose Route Frequency Provider Last Rate Last Dose  . acetaminophen (TYLENOL) tablet 650 mg  650 mg Oral Q6H PRN Monica Becton, MD   650 mg at 12/17/17 2007  . albuterol (PROVENTIL) (2.5 MG/3ML) 0.083% nebulizer solution 2.5 mg  2.5 mg Nebulization Q4H PRN Janece Canterbury, MD   2.5 mg at 12/16/17 0415  . docusate sodium (COLACE) capsule  100 mg  100 mg Oral BID Monica Becton, MD   100 mg at 12/18/17 7893  . escitalopram (LEXAPRO) tablet 10 mg  10 mg Oral Daily Monica Becton, MD   10 mg at 12/18/17 8101  . hydroxychloroquine (PLAQUENIL) tablet 200 mg  200 mg Oral BID Janece Canterbury, MD   200 mg at 12/18/17 7510  . ipratropium-albuterol (DUONEB) 0.5-2.5 (3) MG/3ML nebulizer solution 3 mL  3 mL Nebulization TID Janece Canterbury, MD   3 mL  at 12/18/17 0808  . levothyroxine (SYNTHROID, LEVOTHROID) tablet 112 mcg  112 mcg Oral Once per day on Mon Tue Wed Thu Fri Sat Monica Becton, MD   112 mcg at 12/18/17 2585  . levothyroxine (SYNTHROID, LEVOTHROID) tablet 56 mcg  56 mcg Oral Q Jenel Lucks, Grier Mitts, MD   56 mcg at 12/13/17 0909  . MEDLINE mouth rinse  15 mL Mouth Rinse BID Georgette Shell, MD   15 mL at 12/18/17 2778  . metoprolol tartrate (LOPRESSOR) tablet 12.5 mg  12.5 mg Oral BID Janece Canterbury, MD   12.5 mg at 12/18/17 2423  . polyethylene glycol (MIRALAX / GLYCOLAX) packet 17 g  17 g Oral Daily PRN Short, Mackenzie, MD      . predniSONE (DELTASONE) tablet 5 mg  5 mg Oral BID WC Janece Canterbury, MD   5 mg at 12/18/17 0808     Discharge Medications: Please see discharge summary for a list of discharge medications.  Relevant Imaging Results:  Relevant Lab Results:   Additional Information SSN    [id number]  Burnis Medin, LCSW

## 2017-12-18 NOTE — Progress Notes (Signed)
   12/18/17 1500  Clinical Encounter Type  Visited With Patient;Health care provider  Visit Type Initial  Referral From Nurse  Consult/Referral To Chaplain  Spiritual Encounters  Spiritual Needs Prayer;Literature   Responded to a SCC for a HCPA.  Patient was interested in the information.  I went over the form with her and she wants her daughter to look it over.  We talked about her family and her faith.  She wants to get stronger to get home soon.  Will follow as needed. Chaplain Katherene Ponto

## 2017-12-18 NOTE — Progress Notes (Signed)
PROGRESS NOTE    Hannah Pitts  KDX:[id number] DOB: 1946/09/30 DOA: 12/12/2017 PCP: Tonia Ghent, MD  Brief Narrative71 year old female with history of Sjogren's and lupus on hydroxychloroquine, obesity who has had some shortness of breath for the last 5-6 months. She became acutely more dyspneic over the last 2 days and presented to the emergency department where she was found to have submassive right pulmonary emboli with evidence of right heart strain. Her initial troponin was minimally elevated. She was started on a heparin drip and admitted to the stepdown unit. Lower extremity duplex demonstrated bilateral DVTs and echocardiogram demonstrated grade 1 diastolic dysfunction, preserved ejection fraction of 60-65%. Right ventricular systolic function was normal. There was trivial tricuspid valve regurgitation and pulmonary arterial pressure cannot be accurately estimated. It does not appear that she has significant right heart strain secondary to her submassive PE. Initially, she was transitioned over to Xarelto, however this morning it appears she has some acute kidney injury. Her Xarelto has been discontinued and she has been restarted on a heparin drip pending further investigation and treatment of her AKI.To an ordering error, her IV fluids were not started yesterday. I suspect that this is prerenal after she was diuresed when she first came to the emergency department wheezing. Her renal ultrasound demonstrates no evidence of obstruction. Her urinalysis was bland, inconsistent with lupus nephritis or ischemic kidney. She has been started on IV fluids and we will repeat her creatinine tomorrow morning. If her creatinine continues to rise despite IV fluids, nephrology will be consulted..   12/20- ct abdomen-Large multicentric left rectus abdominis hematoma extending from left paraumbilical region the symphysis pubis.    Assessment & Plan:   Active Problems:   Debility   COPD with acute bronchitis (HCC)   Pulmonary embolism (HCC)   Elevated troponin   Congestive heart failure (CHF) (HCC)   Obesity hypoventilation syndrome (HCC) Acute respiratory failure with hypoxia secondary to acute submassive pulmonary embolism/bilateral lower extremity DVT-patient was started on heparin drip but her hemoglobin started dropping along with newly diagnosed left rectus sheath hematoma.  Appreciate assistance overnight from nurse practitioner [facility].  Heparin has been stopped at this time an IVC filter placement has been ordered to be done by IR.  This was discussed in detail with patient and patient's daughter.  Patient's daughter also reported that patient wants to be DO NOT RESUSCITATE.  Acute on chronic CKD stage III renal functions improved with IV hydration.        DVT prophylaxis scd Code Status dnr Family Communication:no family availbale today Disposition Plan: tbd  Consultants: ir  Procedures:ivc filter Antimicrobials:  none Subjective: Feels better  Objective:resting in bed in nad Vitals:   12/17/17 2023 12/17/17 2051 12/18/17 0435 12/18/17 1443  BP: (!) 132/52  140/71 (!) 153/58  Pulse: 69  70 83  Resp: 19  18 20   Temp: 99.5 F (37.5 C)  98.1 F (36.7 C) 98 F (36.7 C)  TempSrc: Oral  Oral Oral  SpO2: 96% 96% 97% 97%  Weight:      Height:        Intake/Output Summary (Last 24 hours) at 12/18/2017 1512 Last data filed at 12/18/2017 1300 Gross per 24 hour  Intake 577.5 ml  Output 300 ml  Net 277.5 ml   Filed Weights   12/12/17 0445 12/15/17 1357  Weight: 108.9 kg (240 lb) 110 kg (242 lb 8.1 oz)    Examination:  General exam: Appears calm and comfortable  Respiratory  system: wheezing to auscultation. Respiratory effort normal. Cardiovascular system: S1 & S2 heard, RRR. No JVD, murmurs, rubs, gallops or clicks. No pedal edema. Gastrointestinal system: Abdomen is nondistended, soft and nontender. No organomegaly or  masses felt. Normal bowel sounds heard. Central nervous system: Alert and oriented. No focal neurological deficits. Extremities: Symmetric 5 x 5 power. Skin: No rashes, lesions or ulcers Psychiatry: Judgement and insight appear normal. Mood & affect appropriate.     Data Reviewed: I have personally reviewed following labs and imaging studies  CBC: Recent Labs  Lab 12/14/17 0329 12/15/17 0537 12/16/17 0106  12/17/17 0405 12/17/17 0805 12/17/17 1651 12/17/17 2205 12/18/17 0414  WBC 21.2* 11.5* 9.4  --  9.0  --   --   --  7.9  HGB 12.2 9.5* 7.6*   < > 6.4* 6.5* 7.3* 8.5* 8.5*  HCT 37.7 29.3* 22.8*   < > 19.3* 19.9* 22.2* 26.0* 25.8*  MCV 93.5 92.4 91.2  --  92.8  --   --   --  89.6  PLT 180 141* 125*  --  122*  --   --   --  129*   < > = values in this interval not displayed.   Basic Metabolic Panel: Recent Labs  Lab 12/12/17 1832 12/14/17 0329 12/15/17 0537 12/16/17 0106 12/17/17 0405 12/18/17 0414  NA  --  139 138 135 141 140  K  --  3.6 3.6 3.7 3.9 3.9  CL  --  101 101 101 111 109  CO2  --  25 27 26 25 26   GLUCOSE  --  147* 92 96 89 91  BUN  --  42* 57* 51* 34* 29*  CREATININE  --  2.35* 3.09* 1.87* 1.21* 0.96  CALCIUM  --  8.7* 8.7* 7.8* 7.8* 8.1*  MG 1.5*  --   --   --   --   --    GFR: Estimated Creatinine Clearance: 65.4 mL/min (by C-G formula based on SCr of 0.96 mg/dL). Liver Function Tests: Recent Labs  Lab 12/12/17 0549  AST 39  ALT 25  ALKPHOS 70  BILITOT 1.1  PROT 6.8  ALBUMIN 3.6   No results for input(s): LIPASE, AMYLASE in the last 168 hours. No results for input(s): AMMONIA in the last 168 hours. Coagulation Profile: Recent Labs  Lab 12/12/17 1832 12/13/17 0344  INR 1.10 1.15   Cardiac Enzymes: Recent Labs  Lab 12/12/17 0549 12/12/17 1832 12/12/17 2245 12/13/17 0344  TROPONINI 0.10* 0.17* 0.19* 0.14*   BNP (last 3 results) No results for input(s): PROBNP in the last 8760 hours. HbA1C: No results for input(s): HGBA1C in the  last 72 hours. CBG: No results for input(s): GLUCAP in the last 168 hours. Lipid Profile: No results for input(s): CHOL, HDL, LDLCALC, TRIG, CHOLHDL, LDLDIRECT in the last 72 hours. Thyroid Function Tests: No results for input(s): TSH, T4TOTAL, FREET4, T3FREE, THYROIDAB in the last 72 hours. Anemia Panel: Recent Labs    12/16/17 0106  VITAMINB12 743  FOLATE 22.0  FERRITIN 28  TIBC 232*  IRON 18*   Sepsis Labs: No results for input(s): PROCALCITON, LATICACIDVEN in the last 168 hours.  Recent Results (from the past 240 hour(s))  MRSA PCR Screening     Status: None   Collection Time: 12/12/17  5:14 PM  Result Value Ref Range Status   MRSA by PCR NEGATIVE NEGATIVE Final    Comment:        The GeneXpert MRSA Assay (FDA approved for  NASAL specimens only), is one component of a comprehensive MRSA colonization surveillance program. It is not intended to diagnose MRSA infection nor to guide or monitor treatment for MRSA infections.          Radiology Studies: Ct Abdomen Pelvis Wo Contrast  Result Date: 12/16/2017 CLINICAL DATA:  71 y/o F; history of Sjogren's, lupus, pulmonary embolus, an acute kidney injury. Abdominal distention. EXAM: CT ABDOMEN AND PELVIS WITHOUT CONTRAST TECHNIQUE: Multidetector CT imaging of the abdomen and pelvis was performed following the standard protocol without IV contrast. COMPARISON:  None. FINDINGS: Lower chest: Left lower lobe platelike opacity, likely atelectasis. Hepatobiliary: Hepatic steatosis. Cholecystectomy. No biliary ductal dilatation. Pancreas: Unremarkable. No pancreatic ductal dilatation or surrounding inflammatory changes. Spleen: Normal in size without focal abnormality. Adrenals/Urinary Tract: Small bilateral renal cysts. Normal adrenal glands. No hydronephrosis. Normal bladder. Stomach/Bowel: Stomach is within normal limits. Mild sigmoid colon diverticulosis. No evidence of bowel wall thickening, distention, or inflammatory changes.  Appendix not identified, no pericecal inflammation. Vascular/Lymphatic: Aortic atherosclerosis. No enlarged abdominal or pelvic lymph nodes. Reproductive: Status post hysterectomy. No adnexal masses. Other: No abdominal wall hernia or abnormality. No abdominopelvic ascites. Musculoskeletal: Large multicentric left rectus abdominis hematoma extending from the left paraumbilical region to the symphysis pubis. IMPRESSION: 1. Large multicentric left rectus abdominis hematoma extending from left paraumbilical region the symphysis pubis. 2. Hepatic steatosis. 3. Mild sigmoid diverticulosis. 4. Aortic atherosclerosis. These results will be called to the ordering clinician or representative by the Radiologist Assistant, and communication documented in the PACS or zVision Dashboard. Electronically Signed   By: Kristine Garbe M.D.   On: 12/16/2017 19:53   Ir Ivc Filter Plmt / S&i /img Guid/mod Sed  Result Date: 12/17/2017 INDICATION: History of Sjogren's syndrome and lupus who was recently admitted to [facility] with increasing dyspnea and subsequently found to have right-sided pulmonary embolism and bilateral lower extremity DVTs. The patient was placed on anti coagulation, however subsequently developed a large rectus sheath hematoma and as such, request is made for IVC filter placement for the purposes of temporary caval interruption. EXAM: ULTRASOUND GUIDANCE FOR VASCULAR ACCESS IVC CATHETERIZATION AND VENOGRAM IVC FILTER INSERTION COMPARISON:  CT abdomen pelvis - 12/16/2017; chest CTA - 12/12/2017 MEDICATIONS: None. ANESTHESIA/SEDATION: Fentanyl 100 mcg IV; Versed 2 mg IV Sedation Time: 16 minutes; The patient was continuously monitored during the procedure by the interventional radiology nurse under my direct supervision. CONTRAST:  70mL ISOVUE-300 IOPAMIDOL (ISOVUE-300) INJECTION 61% FLUOROSCOPY TIME:  1 minutes 42 seconds (017 mGy) COMPLICATIONS: None immediate PROCEDURE: Informed consent was  obtained from the patient following explanation of the procedure, risks, benefits and alternatives. The patient understands, agrees and consents for the procedure. All questions were addressed. A time out was performed prior to the initiation of the procedure. Maximal barrier sterile technique utilized including caps, mask, sterile gowns, sterile gloves, large sterile drape, hand hygiene, and Betadine prep. Under sterile condition and local anesthesia, right internal jugular venous access was performed with ultrasound. An ultrasound image was saved and sent to PACS. Over a guidewire, the IVC filter delivery sheath and inner dilator were advanced into the IVC just above the IVC bifurcation. Contrast injection was performed for an IVC venogram. Through the delivery sheath, a retrievable Denali IVC filter was deployed below the level of the renal veins and above the IVC bifurcation. Limited post deployment venacavagram was performed. The delivery sheath was removed and hemostasis was obtained with manual compression. A dressing was placed. The patient tolerated the procedure well  without immediate post procedural complication. FINDINGS: The IVC is patent. No evidence of thrombus, stenosis, or occlusion. No variant venous anatomy. Successful placement of the IVC filter below the level of the renal veins. IMPRESSION: Successful ultrasound and fluoroscopically guided placement of an infrarenal retrievable IVC filter via right jugular approach. PLAN: This IVC filter is potentially retrievable. The patient will be assessed for filter retrieval by Interventional Radiology in approximately 8-12 weeks. Further recommendations regarding filter retrieval, continued surveillance or declaration of device permanence, will be made at that time. Electronically Signed   By: Sandi Mariscal M.D.   On: 12/17/2017 17:19   Dg Chest Port 1 View  Result Date: 12/17/2017 CLINICAL DATA:  Hypoxia EXAM: PORTABLE CHEST 1 VIEW COMPARISON:   12/12/2017 FINDINGS: Left ventricular prominence. Aortic atherosclerosis and unfolding. Upper lungs are clear. Minimal basilar atelectasis. No change since previous study. IMPRESSION: Minimal basilar atelectasis. Cardiomegaly and aortic atherosclerosis. Electronically Signed   By: Nelson Chimes M.D.   On: 12/17/2017 07:51        Scheduled Meds: . docusate sodium  100 mg Oral BID  . escitalopram  10 mg Oral Daily  . hydroxychloroquine  200 mg Oral BID  . ipratropium-albuterol  3 mL Nebulization TID  . levothyroxine  112 mcg Oral Once per day on Mon Tue Wed Thu Fri Sat  . levothyroxine  56 mcg Oral Q Sun  . mouth rinse  15 mL Mouth Rinse BID  . metoprolol tartrate  12.5 mg Oral BID  . predniSONE  5 mg Oral BID WC   Continuous Infusions:   LOS: 6 days      Georgette Shell, MD [facility]  If 7PM-7AM, please contact night-coverage [email or web address] Password [email or web address] 12/18/2017, 3:12 PM

## 2017-12-18 NOTE — Clinical Social Work Placement (Signed)
Patient's PASRR pending under Manual review, MUST ID [id number].  CLINICAL SOCIAL WORK PLACEMENT  NOTE  Date:  12/18/2017  Patient Details  Name: Hannah Pitts MRN: [id number] Date of Birth: Jul 24, 1946  Clinical Social Work is seeking post-discharge placement for this patient at the [place] level of care (*CSW will initial, date and re-position this form in  chart as items are completed):  Yes   Patient/family provided with [place] Work Department's list of facilities offering this level of care within the geographic area requested by the patient (or if unable, by the patient's family).  Yes   Patient/family informed of their freedom to choose among providers that offer the needed level of care, that participate in Medicare, Medicaid or managed care program needed by the patient, have an available bed and are willing to accept the patient.  Yes   Patient/family informed of [facility]'s ownership interest in [facility] and [facility], as well as of the fact that they are under no obligation to receive care at these facilities.  PASRR submitted to EDS on 12/18/17     PASRR number received on       Existing PASRR number confirmed on       FL2 transmitted to all facilities in geographic area requested by pt/family on 12/18/17     FL2 transmitted to all facilities within larger geographic area on       Patient informed that his/her managed care company has contracts with or will negotiate with certain facilities, including the following:            Patient/family informed of bed offers received.  Patient chooses bed at       Physician recommends and patient chooses bed at      Patient to be transferred to   on  .  Patient to be transferred to facility by       Patient family notified on   of transfer.  Name of family member notified:        PHYSICIAN       Additional Comment:     _______________________________________________ Burnis Medin, LCSW 12/18/2017, 3:06 PM

## 2017-12-18 NOTE — Progress Notes (Signed)
Physical Therapy Treatment Patient Details Name: Hannah Pitts MRN: [id number] DOB: [birth year] Today's Date: 12/18/2017    History of Present Illness 71 yo female admitted with PE, bil LE DVTs. hx of Sjogren's lupus, obesity    PT Comments    Progressing slowly with mobility. Pt remains weak and demonstrated decreased activity tolerance. Continue to recommend ST rehab at [facility].    Follow Up Recommendations  SNF     Equipment Recommendations  None recommended by PT    Recommendations for Other Services       Precautions / Restrictions Precautions Precautions: Fall Precaution Comments: incontinent Restrictions Weight Bearing Restrictions: No    Mobility  Bed Mobility Overal bed mobility: Needs Assistance Bed Mobility: Supine to Sit     Supine to sit: HOB elevated;Mod assist     General bed mobility comments: Assist for trunk and LEs. Increased time.   Transfers Overall transfer level: Needs assistance Equipment used: Rolling walker (2 wheeled) Transfers: Sit to/from Stand Sit to Stand: Min assist;+2 physical assistance;+2 safety/equipment         General transfer comment: Assist to rise, stabilize, control descent. VCS safety, hand placement, technique. Stood statically at bedside for a short while before ambulating  Ambulation/Gait Ambulation/Gait assistance: Min assist;+2 physical assistance;+2 safety/equipment Ambulation Distance (Feet): 15 Feet Assistive device: Rolling walker (2 wheeled) Gait Pattern/deviations: Step-through pattern;Step-to pattern;Decreased stride length     General Gait Details: Assist to support pt throughout distance. Bil LE instability/weakness noted. Very slow gait speed. Followed closely with recliner. Remained on [place] O2. Pt fatigues easily.    Stairs            Wheelchair Mobility    Modified Rankin (Stroke Patients Only)       Balance Overall balance assessment: Needs assistance         Standing balance  support: Bilateral upper extremity supported Standing balance-Leahy Scale: Poor                              Cognition Arousal/Alertness: Awake/alert Behavior During Therapy: WFL for tasks assessed/performed Overall Cognitive Status: Within Functional Limits for tasks assessed                                        Exercises      General Comments        Pertinent Vitals/Pain Faces Pain Scale: Hurts little more Pain Location: L LQ Pain Descriptors / Indicators: Discomfort;Sore Pain Intervention(s): Limited activity within patient's tolerance;Repositioned    Home Living                      Prior Function            PT Goals (current goals can now be found in the care plan section) Progress towards PT goals: Progressing toward goals    Frequency    Min 3X/week      PT Plan Frequency needs to be updated    Co-evaluation              AM-PAC PT "6 Clicks" Daily Activity  Outcome Measure  Difficulty turning over in bed (including adjusting bedclothes, sheets and blankets)?: Unable Difficulty moving from lying on back to sitting on the side of the bed? : Unable Difficulty sitting down on and standing up from a chair with arms (e.g.,  wheelchair, bedside commode, etc,.)?: Unable Help needed moving to and from a bed to chair (including a wheelchair)?: A Lot Help needed walking in hospital room?: A Lot Help needed climbing 3-5 steps with a railing? : Total 6 Click Score: 8    End of Session Equipment Utilized During Treatment: Gait belt;Oxygen Activity Tolerance: Patient limited by fatigue Patient left: in chair;with call bell/phone within reach;with family/visitor present   PT Visit Diagnosis: Muscle weakness (generalized) (M62.81);Difficulty in walking, not elsewhere classified (R26.2)     Time: 0981-1914 PT Time Calculation (min) (ACUTE ONLY): 10 min  Charges:  $Gait Training: 8-22 mins                    G  Codes:          Weston Anna, MPT Pager: [phone]

## 2017-12-18 NOTE — Clinical Social Work Note (Signed)
Clinical Social Work Assessment  Patient Details  Name: Hannah Pitts MRN: [id number] Date of Birth: 05/15/1946  Date of referral:  12/18/17               Reason for consult:  Facility Placement                Permission sought to share information with:  Facility Sport and exercise psychologist, Family Supports Permission granted to share information::  Yes, Verbal Permission Granted  Name::     Hannah Pitts  Agency::     Relationship::  Daughter   Contact Information:  [phone]  Housing/Transportation Living arrangements for the past 2 months:  Single Family Home Source of Information:  Patient Patient Interpreter Needed:  None Criminal Activity/Legal Involvement Pertinent to Current Situation/Hospitalization:  No - Comment as needed Significant Relationships:  Adult Children Lives with:  Adult Children Do you feel safe going back to the place where you live?  (PT recommending SNF) Need for family participation in patient care:  No (Coment)  Care giving concerns:  Patient from home with daughter Hannah Pitts). Patient reported that prior to hospitalization she was independent with ADLs, noting it took her a while to complete things. Patient reported that she has issues with her hands and weakness. PT recommending SNF.   Social Worker assessment / plan:  CSW spoke with patient at bedside regarding PT recommendation for SNF, patient agreeable. CSW explained SNF placement process, patient verbalized understanding.  CSW will complete FL2 and provide bed offers. CSW will continue to follow and assist with discharge planning.   Employment status:  Retired Nurse, adult PT Recommendations:  [place] / Referral to community resources:  [facility]  Patient/Family's Response to care:  Patient agreeable to SNF for [employer].  Patient/Family's Understanding of and Emotional Response to Diagnosis, Current Treatment, and  Prognosis:  Patient presented calm and verbalized some understanding of diagnosis. Patient verbalized plan to discharge to SNF for ST rehab, noting she cant go home in her current condition. CSW provided active listening.   Emotional Assessment Appearance:  Appears stated age Attitude/Demeanor/Rapport:  Other(Cooperative) Affect (typically observed):  Calm Orientation:  Oriented to Self, Oriented to Place, Oriented to  Time, Oriented to Situation Alcohol / Substance use:  Not Applicable Psych involvement (Current and /or in the community):  No (Comment)  Discharge Needs  Concerns to be addressed:  Care Coordination Readmission within the last 30 days:  No Current discharge risk:  Physical Impairment Barriers to Discharge:  Continued Medical Work up   [employer], LCSW 12/18/2017, 2:44 PM

## 2017-12-19 LAB — BASIC METABOLIC PANEL
ANION GAP: 8 (ref 5–15)
BUN: 26 mg/dL — ABNORMAL HIGH (ref 6–20)
CO2: 27 mmol/L (ref 22–32)
Calcium: 8.2 mg/dL — ABNORMAL LOW (ref 8.9–10.3)
Chloride: 104 mmol/L (ref 101–111)
Creatinine, Ser: 0.91 mg/dL (ref 0.44–1.00)
GFR calc non Af Amer: >60 mL/min (ref >60)
Glucose, Bld: 98 mg/dL (ref 65–99)
POTASSIUM: 3.6 mmol/L (ref 3.5–5.1)
SODIUM: 139 mmol/L (ref 135–145)

## 2017-12-19 LAB — CBC
HCT: 26 % — ABNORMAL LOW (ref 36.0–46.0)
HEMOGLOBIN: 8.8 g/dL — AB (ref 12.0–15.0)
MCH: 30.6 pg (ref 26.0–34.0)
MCHC: 33.8 g/dL (ref 30.0–36.0)
MCV: 90.3 fL (ref 78.0–100.0)
PLATELETS: 136 10*3/uL — AB (ref 150–400)
RBC: 2.88 MIL/uL — AB (ref 3.87–5.11)
RDW: 14.2 % (ref 11.5–15.5)
WBC: 6.5 10*3/uL (ref 4.0–10.5)

## 2017-12-19 LAB — HEMOGLOBIN AND HEMATOCRIT, BLOOD
HCT: 27.2 % — ABNORMAL LOW (ref 36.0–46.0)
HEMATOCRIT: 26.5 % — AB (ref 36.0–46.0)
HEMATOCRIT: 27.1 % — AB (ref 36.0–46.0)
HEMOGLOBIN: 8.8 g/dL — AB (ref 12.0–15.0)
HEMOGLOBIN: 8.7 g/dL — AB (ref 12.0–15.0)
HEMOGLOBIN: 8.6 g/dL — AB (ref 12.0–15.0)

## 2017-12-19 MED ORDER — DIPHENHYDRAMINE HCL 25 MG PO CAPS
25.0000 mg | ORAL_CAPSULE | Freq: Three times a day (TID) | ORAL | Status: DC | PRN
  Administered 2017-12-19 – 2017-12-22 (×3): 25 mg via ORAL
  Filled 2017-12-19 (×3): qty 1

## 2017-12-19 MED ORDER — IPRATROPIUM-ALBUTEROL 0.5-2.5 (3) MG/3ML IN SOLN
3.0000 mL | Freq: Two times a day (BID) | RESPIRATORY_TRACT | Status: DC
  Administered 2017-12-19 – 2017-12-23 (×9): 3 mL via RESPIRATORY_TRACT
  Filled 2017-12-19 (×10): qty 3

## 2017-12-19 NOTE — Progress Notes (Signed)
PROGRESS NOTE    JAELEEN Pitts  KWI:[id number] DOB: 1946-10-20 DOA: 12/12/2017 PCP: Tonia Ghent, MD   Brief Narrative:[age]ative76 year old female with history of Sjogren's and lupus on hydroxychloroquine, obesity who has had some shortness of breath for the last 5-6 months. She became acutely more dyspneic over the last 2 days and presented to the emergency department where she was found to have submassive right pulmonary emboli with evidence of right heart strain. Her initial troponin was minimally elevated. She was started on a heparin drip and admitted to the stepdown unit. Lower extremity duplex demonstrated bilateral DVTs and echocardiogram demonstrated grade 1 diastolic dysfunction, preserved ejection fraction of 60-65%. Right ventricular systolic function was normal. There was trivial tricuspid valve regurgitation and pulmonary arterial pressure cannot be accurately estimated. It does not appear that she has significant right heart strain secondary to her submassive PE. Initially, she was transitioned over to Xarelto, however this morning it appears she has some acute kidney injury. Her Xarelto has been discontinued and she has been restarted on a heparin drip pending further investigation and treatment of her AKI.To an ordering error, her IV fluids were not started yesterday. I suspect that this is prerenal after she was diuresed when she first came to the emergency department wheezing. Her renal ultrasound demonstrates no evidence of obstruction. Her urinalysis was bland, inconsistent with lupus nephritis or ischemic kidney. She has been started on IV fluids and we will repeat her creatinine tomorrow morning. If her creatinine continues to rise despite IV fluids, nephrology will be consulted..   12/20- ct abdomen-Large multicentric left rectus abdominis hematoma extending from left paraumbilical region the symphysis pubis.     Assessment & Plan:   Active  Problems:   Debility   COPD with acute bronchitis (HCC)   Pulmonary embolism (HCC)   Elevated troponin   Congestive heart failure (CHF) (HCC)   Obesity hypoventilation syndrome (HCC)  Submassive acute bilateral pulmonary embolism with acute bilateral lower extremity DVT-patient was started on heparin drip.  She developed left rectus sheath hematoma with drop in hemoglobin.  Heparin was stopped.  IVC filter was placed.  Her abdominal pain is getting better she received 2 units of blood transfusion.  Her hemoglobin has been stable since stopping heparin and receiving transfusion.  Acute on chronic CKD stage III stable  Sjogren's syndrome no symptoms stable at this time  SLE was on hydroxychloroquine.  On Deltasone 5 mg twice a day.  Hypothyroidism continue Synthroid.       DVT prophylaxis: None patient has bilateral acute DVT.  And she has rectus hematoma on heparin. Code Status: DO NOT RESUSCITATE Family Communication: Discussed with daughter Disposition Plan: Patient most likely would need sniff placement for rehab upon discharge.  Patient lives at home with her daughter I would think she should be able to be discharged in the next 1-2 days. Consultants:  Interventional radiology appreciate their input Procedures: IVC filter placement 12/18/19 18 Antimicrobials: None Subjective: Feels better pain in the abdomen better   Objective: Resting in bed in no acute distress Vitals:   12/18/17 2223 12/19/17 0405 12/19/17 0759 12/19/17 0803  BP: (!) 143/73 (!) 156/66    Pulse: 85 76    Resp: 18 20    Temp: 99 F (37.2 C) 97.7 F (36.5 C)    TempSrc: Oral Oral    SpO2: 98% 99% 94% 94%  Weight:  98.3 kg (216 lb 11.4 oz)    Height:  Intake/Output Summary (Last 24 hours) at 12/19/2017 1328 Last data filed at 12/19/2017 1130 Gross per 24 hour  Intake 360 ml  Output 400 ml  Net -40 ml   Filed Weights   12/12/17 0445 12/15/17 1357 12/19/17 0405  Weight: 108.9 kg (240 lb)  110 kg (242 lb 8.1 oz) 98.3 kg (216 lb 11.4 oz)    Examination:  General exam: Appears calm and comfortable  Respiratory system: wheezing auscultation. Respiratory effort normal. Cardiovascular system: S1 & S2 heard, RRR. No JVD, murmurs, rubs, gallops or clicks. No pedal edema. Gastrointestinal system: Abdomen is nondistended, soft and nontender. No organomegaly or masses felt. Normal bowel sounds heard. Central nervous system: Alert and oriented. No focal neurological deficits. Extremities: Symmetric 5 x 5 power. Skin: No rashes, lesions or ulcers Psychiatry: Judgement and insight appear normal. Mood & affect appropriate.     Data Reviewed: I have personally reviewed following labs and imaging studies  CBC: Recent Labs  Lab 12/15/17 0537 12/16/17 0106  12/17/17 0405  12/18/17 0414 12/18/17 1038 12/18/17 1638 12/18/17 2254 12/19/17 0517 12/19/17 1100  WBC 11.5* 9.4  --  9.0  --  7.9  --   --   --  6.5  --   HGB 9.5* 7.6*   < > 6.4*   < > 8.5* 8.7* 8.8* 9.4* 8.8* 8.8*  HCT 29.3* 22.8*   < > 19.3*   < > 25.8* 26.7* 27.4* 28.7* 26.0* 26.5*  MCV 92.4 91.2  --  92.8  --  89.6  --   --   --  90.3  --   PLT 141* 125*  --  122*  --  129*  --   --   --  136*  --    < > = values in this interval not displayed.   Basic Metabolic Panel: Recent Labs  Lab 12/12/17 1832  12/15/17 0537 12/16/17 0106 12/17/17 0405 12/18/17 0414 12/19/17 0517  NA  --    < > 138 135 141 140 139  K  --    < > 3.6 3.7 3.9 3.9 3.6  CL  --    < > 101 101 111 109 104  CO2  --    < > 27 26 25 26 27   GLUCOSE  --    < > 92 96 89 91 98  BUN  --    < > 57* 51* 34* 29* 26*  CREATININE  --    < > 3.09* 1.87* 1.21* 0.96 0.91  CALCIUM  --    < > 8.7* 7.8* 7.8* 8.1* 8.2*  MG 1.5*  --   --   --   --   --   --    < > = values in this interval not displayed.   GFR: Estimated Creatinine Clearance: 64.8 mL/min (by C-G formula based on SCr of 0.91 mg/dL). Liver Function Tests: No results for input(s): AST, ALT,  ALKPHOS, BILITOT, PROT, ALBUMIN in the last 168 hours. No results for input(s): LIPASE, AMYLASE in the last 168 hours. No results for input(s): AMMONIA in the last 168 hours. Coagulation Profile: Recent Labs  Lab 12/12/17 1832 12/13/17 0344  INR 1.10 1.15   Cardiac Enzymes: Recent Labs  Lab 12/12/17 1832 12/12/17 2245 12/13/17 0344  TROPONINI 0.17* 0.19* 0.14*   BNP (last 3 results) No results for input(s): PROBNP in the last 8760 hours. HbA1C: No results for input(s): HGBA1C in the last 72 hours. CBG: No results for input(s): GLUCAP  in the last 168 hours. Lipid Profile: No results for input(s): CHOL, HDL, LDLCALC, TRIG, CHOLHDL, LDLDIRECT in the last 72 hours. Thyroid Function Tests: No results for input(s): TSH, T4TOTAL, FREET4, T3FREE, THYROIDAB in the last 72 hours. Anemia Panel: No results for input(s): VITAMINB12, FOLATE, FERRITIN, TIBC, IRON, RETICCTPCT in the last 72 hours. Sepsis Labs: No results for input(s): PROCALCITON, LATICACIDVEN in the last 168 hours.  Recent Results (from the past 240 hour(s))  MRSA PCR Screening     Status: None   Collection Time: 12/12/17  5:14 PM  Result Value Ref Range Status   MRSA by PCR NEGATIVE NEGATIVE Final    Comment:        The GeneXpert MRSA Assay (FDA approved for NASAL specimens only), is one component of a comprehensive MRSA colonization surveillance program. It is not intended to diagnose MRSA infection nor to guide or monitor treatment for MRSA infections.          Radiology Studies: Ir Ivc Filter Plmt / S&i /img Guid/mod Sed  Result Date: 12/17/2017 INDICATION: History of Sjogren's syndrome and lupus who was recently admitted to [facility] with increasing dyspnea and subsequently found to have right-sided pulmonary embolism and bilateral lower extremity DVTs. The patient was placed on anti coagulation, however subsequently developed a large rectus sheath hematoma and as such, request is made for  IVC filter placement for the purposes of temporary caval interruption. EXAM: ULTRASOUND GUIDANCE FOR VASCULAR ACCESS IVC CATHETERIZATION AND VENOGRAM IVC FILTER INSERTION COMPARISON:  CT abdomen pelvis - 12/16/2017; chest CTA - 12/12/2017 MEDICATIONS: None. ANESTHESIA/SEDATION: Fentanyl 100 mcg IV; Versed 2 mg IV Sedation Time: 16 minutes; The patient was continuously monitored during the procedure by the interventional radiology nurse under my direct supervision. CONTRAST:  40mL ISOVUE-300 IOPAMIDOL (ISOVUE-300) INJECTION 61% FLUOROSCOPY TIME:  1 minutes 42 seconds (235 mGy) COMPLICATIONS: None immediate PROCEDURE: Informed consent was obtained from the patient following explanation of the procedure, risks, benefits and alternatives. The patient understands, agrees and consents for the procedure. All questions were addressed. A time out was performed prior to the initiation of the procedure. Maximal barrier sterile technique utilized including caps, mask, sterile gowns, sterile gloves, large sterile drape, hand hygiene, and Betadine prep. Under sterile condition and local anesthesia, right internal jugular venous access was performed with ultrasound. An ultrasound image was saved and sent to PACS. Over a guidewire, the IVC filter delivery sheath and inner dilator were advanced into the IVC just above the IVC bifurcation. Contrast injection was performed for an IVC venogram. Through the delivery sheath, a retrievable Denali IVC filter was deployed below the level of the renal veins and above the IVC bifurcation. Limited post deployment venacavagram was performed. The delivery sheath was removed and hemostasis was obtained with manual compression. A dressing was placed. The patient tolerated the procedure well without immediate post procedural complication. FINDINGS: The IVC is patent. No evidence of thrombus, stenosis, or occlusion. No variant venous anatomy. Successful placement of the IVC filter below the level of  the renal veins. IMPRESSION: Successful ultrasound and fluoroscopically guided placement of an infrarenal retrievable IVC filter via right jugular approach. PLAN: This IVC filter is potentially retrievable. The patient will be assessed for filter retrieval by Interventional Radiology in approximately 8-12 weeks. Further recommendations regarding filter retrieval, continued surveillance or declaration of device permanence, will be made at that time. Electronically Signed   By: Sandi Mariscal M.D.   On: 12/17/2017 17:19  Scheduled Meds: . docusate sodium  100 mg Oral BID  . escitalopram  10 mg Oral Daily  . hydroxychloroquine  200 mg Oral BID  . ipratropium-albuterol  3 mL Nebulization BID  . levothyroxine  112 mcg Oral Once per day on Mon Tue Wed Thu Fri Sat  . levothyroxine  56 mcg Oral Q Sun  . mouth rinse  15 mL Mouth Rinse BID  . metoprolol tartrate  12.5 mg Oral BID  . predniSONE  5 mg Oral BID WC   Continuous Infusions:   LOS: 7 days       Georgette Shell, MD [facility]  If 7PM-7AM, please contact night-coverage [email or web address] Password [email or web address] 12/19/2017, 1:28 PM

## 2017-12-20 LAB — CBC
HCT: 26.8 % — ABNORMAL LOW (ref 36.0–46.0)
HEMOGLOBIN: 8.9 g/dL — AB (ref 12.0–15.0)
MCH: 30.1 pg (ref 26.0–34.0)
MCHC: 33.2 g/dL (ref 30.0–36.0)
MCV: 90.5 fL (ref 78.0–100.0)
Platelets: 155 10*3/uL (ref 150–400)
RBC: 2.96 MIL/uL — AB (ref 3.87–5.11)
RDW: 14.2 % (ref 11.5–15.5)
WBC: 6.9 10*3/uL (ref 4.0–10.5)

## 2017-12-20 LAB — BASIC METABOLIC PANEL
ANION GAP: 7 (ref 5–15)
BUN: 24 mg/dL — ABNORMAL HIGH (ref 6–20)
CALCIUM: 8.1 mg/dL — AB (ref 8.9–10.3)
CO2: 28 mmol/L (ref 22–32)
Chloride: 107 mmol/L (ref 101–111)
Creatinine, Ser: 0.83 mg/dL (ref 0.44–1.00)
Glucose, Bld: 84 mg/dL (ref 65–99)
POTASSIUM: 3.8 mmol/L (ref 3.5–5.1)
Sodium: 142 mmol/L (ref 135–145)

## 2017-12-20 LAB — HEMOGLOBIN AND HEMATOCRIT, BLOOD
HEMATOCRIT: 29.2 % — AB (ref 36.0–46.0)
HEMOGLOBIN: 9.4 g/dL — AB (ref 12.0–15.0)

## 2017-12-20 MED ORDER — CAMPHOR-MENTHOL 0.5-0.5 % EX LOTN
TOPICAL_LOTION | CUTANEOUS | Status: DC | PRN
  Administered 2017-12-20 – 2017-12-22 (×3): via TOPICAL
  Filled 2017-12-20: qty 222

## 2017-12-20 NOTE — Plan of Care (Signed)
Patient stable during 7 a to 7 p shift, able to maintain oxygen saturation on room air in the 90's.  Patient up to [facility] and chair with one assist, continues to have some dyspnea with exertion but states this is much improved.  Large hematomas to both arms, breasts and sides, Sarna lotion applied with improvement in itching.  Daughter at bedside during shift.

## 2017-12-20 NOTE — Plan of Care (Signed)
  Progressing Nutrition: Adequate nutrition will be maintained 12/20/2017 2254 - Progressing by Talbert Forest, RN Elimination: Will not experience complications related to urinary retention 12/20/2017 2254 - Progressing by Talbert Forest, RN Pain Managment: General experience of comfort will improve 12/20/2017 2254 - Progressing by Talbert Forest, RN Safety: Ability to remain free from injury will improve 12/20/2017 2254 - Progressing by Talbert Forest, RN Skin Integrity: Risk for impaired skin integrity will decrease 12/20/2017 2254 - Progressing by Talbert Forest, RN

## 2017-12-20 NOTE — Progress Notes (Addendum)
CSW provided current offers to patient dtr for review- she is hopeful for [facility] who had not responded to referral yet  CSW attempted to call admissions coordinator at [facility] but encouraged pt dtr to have 2nd choice of facility for likely DC tomorrow  CSW will continue to follow  Jorge Ny, [place] Social Worker [phone]

## 2017-12-20 NOTE — Progress Notes (Signed)
Physical Therapy Treatment Patient Details Name: Hannah Pitts MRN: [id number] DOB: 08/25/1946 Today's Date: 12/20/2017    History of Present Illness 71 yo female admitted with PE, bil LE DVTs. hx of Sjogren's lupus, obesity    PT Comments    Pt is pleasant, cooperative and motivated but limited by fatigue, DOE;  VSS this session but  Pt continues to report LE weakness, mild dizziness and have 3-4 DOE;  Encouraged OOB in chair; will continue to follow in     acute setting; pt will benefit from SNF  Follow Up Recommendations  SNF     Equipment Recommendations  None recommended by PT    Recommendations for Other Services       Precautions / Restrictions Precautions Precautions: Fall Precaution Comments: pt LEs weak and trembling, needed to sit in chair quickly Restrictions Weight Bearing Restrictions: No    Mobility  Bed Mobility Overal bed mobility: Needs Assistance Bed Mobility: Supine to Sit     Supine to sit: Min assist;HOB elevated     General bed mobility comments: Assist for trunk. Increased time.   Transfers Overall transfer level: Needs assistance Equipment used: Rolling walker (2 wheeled) Transfers: Sit to/from Stand Sit to Stand: Min assist         General transfer comment: 2 standing trials d/t weakness dizziness (VSS);Assist to rise, stabilize, control descent. VCS safety, hand placement, technique. Stood for ~30sec  at bedside, pregait activities/wt shifting prior to  ambulating  Ambulation/Gait Ambulation/Gait assistance: Museum/gallery curator (Feet): 10 Feet Assistive device: Rolling walker (2 wheeled) Gait Pattern/deviations: Step-through pattern;Step-to pattern;Decreased stride length Gait velocity: pt with c/o weakness, dizziness, significant DOE noted (has PE)--BP 144/74, SpO2 95% on RA, HR 100 max   General Gait Details: Assist to support pt throughout distance. Bil LE instability/weakness noted. Very slow gait speed. Followed  closely with recliner.Pt fatigues easily.   Stairs            Wheelchair Mobility    Modified Rankin (Stroke Patients Only)       Balance Overall balance assessment: Needs assistance Sitting-balance support: No upper extremity supported;Feet supported Sitting balance-Leahy Scale: Fair     Standing balance support: Bilateral upper extremity supported Standing balance-Leahy Scale: Poor Standing balance comment: reliant on UEs for static stand                            Cognition Arousal/Alertness: Awake/alert Behavior During Therapy: WFL for tasks assessed/performed Overall Cognitive Status: Within Functional Limits for tasks assessed                                 General Comments: pt demo's insight into current deficits and asks appropriate questions regarding course of rehab in post acute venue      Exercises General Exercises - Lower Extremity Ankle Circles/Pumps: AROM;Both;10 reps Quad Sets: AROM;Both;5 reps Gluteal Sets: AROM;Both;5 reps    General Comments        Pertinent Vitals/Pain Pain Assessment: No/denies pain    Home Living                      Prior Function            PT Goals (current goals can now be found in the care plan section) Acute Rehab PT Goals Patient Stated Goal: to get better, stronger PT Goal Formulation: With  patient Time For Goal Achievement: 12/28/17 Potential to Achieve Goals: Good Progress towards PT goals: Progressing toward goals    Frequency    Min 3X/week      PT Plan Current plan remains appropriate    Co-evaluation              AM-PAC PT "6 Clicks" Daily Activity  Outcome Measure  Difficulty turning over in bed (including adjusting bedclothes, sheets and blankets)?: Unable Difficulty moving from lying on back to sitting on the side of the bed? : Unable Difficulty sitting down on and standing up from a chair with arms (e.g., wheelchair, bedside commode,  etc,.)?: Unable Help needed moving to and from a bed to chair (including a wheelchair)?: A Little Help needed walking in hospital room?: A Lot Help needed climbing 3-5 steps with a railing? : Total 6 Click Score: 9    End of Session Equipment Utilized During Treatment: Gait belt Activity Tolerance: Patient limited by fatigue Patient left: in chair;with call bell/phone within reach Nurse Communication: Mobility status PT Visit Diagnosis: Muscle weakness (generalized) (M62.81);Difficulty in walking, not elsewhere classified (R26.2)     Time: 8329-1916 PT Time Calculation (min) (ACUTE ONLY): 18 min  Charges:  $Gait Training: 8-22 mins                    G CodesKenyon Ana, PT Pager: [phone] 12/20/2017    [facility] 12/20/2017, 3:48 PM

## 2017-12-20 NOTE — Progress Notes (Signed)
Patient ID: Hannah Pitts, female   DOB: 06-28-1946, 71 y.o.   MRN: [id number]  PROGRESS NOTE    MASSIE COGLIANO  VFI:[id number] DOB: [birth year] DOA: 12/12/2017  PCP: Tonia Ghent, MD   Brief Narrative:  71 year old female with history of Sjogren's and lupus on hydroxychloroquine, obesity who has had some shortness of breath for the last 5-6 months. She became acutely more dyspneic over the last 2 days and presented to the emergency department where she was found to have submassive right pulmonary emboli with evidence of right heart strain. Her initial troponin was minimally elevated. She was started on a heparin drip and admitted to the stepdown unit. Lower extremity duplex demonstrated bilateral DVTs and echocardiogram demonstrated grade 1 diastolic dysfunction, preserved ejection fraction of 60-65%. Right ventricular systolic function was normal. There was trivial tricuspid valve regurgitation and pulmonary arterial pressure cannot be accurately estimated. It does not appear that she has significant right heart strain secondary to her submassive PE. Initially, she was transitioned over to Xarelto. Her Xarelto has been discontinued and she has been restarted on a heparin drip pending further investigation and treatment of her AKI.Marland Kitchen Pt was found to have left rectus abdominis hematoma so IVC filter placed 12/20.   Assessment & Plan:  Submassive acute bilateral pulmonary embolism with acute bilateral lower extremity DVT - Developed rectus abdominis hematoma - Now has IVC filter, placed 12/20   Acute on chronic CKD stage III  - Stable   Sjogren's syndrome - Stable   SLE  - Was on Plaquenil - Now on Deltasone  Hypothyroidism - Continue synthroid     DVT prophylaxis: Developed hematoma while on hep drip so now has IVC filter  Code Status: DNR/DNI  Family Communication: no family at the bedside Disposition Plan: home in am   Consultants:   IR for IVC filter  placement   Procedures:   IVC filter placed 12/20   Antimicrobials:   None    Subjective: No overnight events.  Objective: Vitals:   12/20/17 0819 12/20/17 0921 12/20/17 0924 12/20/17 0948  BP:    (!) 141/58  Pulse:    93  Resp:      Temp:      TempSrc:      SpO2: 97% 93% 93% 95%  Weight:      Height:        Intake/Output Summary (Last 24 hours) at 12/20/2017 1107 Last data filed at 12/20/2017 0819 Gross per 24 hour  Intake 240 ml  Output 600 ml  Net -360 ml   Filed Weights   12/12/17 0445 12/15/17 1357 12/19/17 0405  Weight: 108.9 kg (240 lb) 110 kg (242 lb 8.1 oz) 98.3 kg (216 lb 11.4 oz)    Examination:  General exam: Appears calm and comfortable  Respiratory system: Clear to auscultation. Respiratory effort normal. Cardiovascular system: S1 & S2 heard, Rate controlled  Gastrointestinal system: Abdomen is nondistended, soft and nontender. No organomegaly or masses felt. Normal bowel sounds heard. Central nervous system: Alert and oriented. No focal neurological deficits. Extremities: Symmetric 5 x 5 power. Skin: No rashes, lesions or ulcers Psychiatry: Judgement and insight appear normal. Mood & affect appropriate.   Data Reviewed: I have personally reviewed following labs and imaging studies  CBC: Recent Labs  Lab 12/16/17 0106  12/17/17 0405  12/18/17 0414  12/19/17 0517 12/19/17 1100 12/19/17 1624 12/19/17 2248 12/20/17 0529 12/20/17 1033  WBC 9.4  --  9.0  --  7.9  --  6.5  --   --   --  6.9  --   HGB 7.6*   < > 6.4*   < > 8.5*   < > 8.8* 8.8* 8.7* 8.6* 8.9* 9.4*  HCT 22.8*   < > 19.3*   < > 25.8*   < > 26.0* 26.5* 27.1* 27.2* 26.8* 29.2*  MCV 91.2  --  92.8  --  89.6  --  90.3  --   --   --  90.5  --   PLT 125*  --  122*  --  129*  --  136*  --   --   --  155  --    < > = values in this interval not displayed.   Basic Metabolic Panel: Recent Labs  Lab 12/16/17 0106 12/17/17 0405 12/18/17 0414 12/19/17 0517 12/20/17 0529  NA 135  141 140 139 142  K 3.7 3.9 3.9 3.6 3.8  CL 101 111 109 104 107  CO2 26 25 26 27 28   GLUCOSE 96 89 91 98 84  BUN 51* 34* 29* 26* 24*  CREATININE 1.87* 1.21* 0.96 0.91 0.83  CALCIUM 7.8* 7.8* 8.1* 8.2* 8.1*   GFR: Estimated Creatinine Clearance: 71.1 mL/min (by C-G formula based on SCr of 0.83 mg/dL). Liver Function Tests: No results for input(s): AST, ALT, ALKPHOS, BILITOT, PROT, ALBUMIN in the last 168 hours. No results for input(s): LIPASE, AMYLASE in the last 168 hours. No results for input(s): AMMONIA in the last 168 hours. Coagulation Profile: No results for input(s): INR, PROTIME in the last 168 hours. Cardiac Enzymes: No results for input(s): CKTOTAL, CKMB, CKMBINDEX, TROPONINI in the last 168 hours. BNP (last 3 results) No results for input(s): PROBNP in the last 8760 hours. HbA1C: No results for input(s): HGBA1C in the last 72 hours. CBG: No results for input(s): GLUCAP in the last 168 hours. Lipid Profile: No results for input(s): CHOL, HDL, LDLCALC, TRIG, CHOLHDL, LDLDIRECT in the last 72 hours. Thyroid Function Tests: No results for input(s): TSH, T4TOTAL, FREET4, T3FREE, THYROIDAB in the last 72 hours. Anemia Panel: No results for input(s): VITAMINB12, FOLATE, FERRITIN, TIBC, IRON, RETICCTPCT in the last 72 hours. Urine analysis:    Component Value Date/Time   COLORURINE YELLOW 12/14/2017 1406   APPEARANCEUR CLEAR 12/14/2017 1406   LABSPEC 1.014 12/14/2017 1406   PHURINE 5.0 12/14/2017 1406   GLUCOSEU NEGATIVE 12/14/2017 1406   HGBUR NEGATIVE 12/14/2017 1406   HGBUR negative 12/27/2010 0801   BILIRUBINUR NEGATIVE 12/14/2017 1406   BILIRUBINUR Neg 05/20/2013 1010   KETONESUR NEGATIVE 12/14/2017 1406   PROTEINUR NEGATIVE 12/14/2017 1406   UROBILINOGEN negative 05/20/2013 1010   UROBILINOGEN 0.2 12/27/2010 0801   NITRITE NEGATIVE 12/14/2017 1406   LEUKOCYTESUR NEGATIVE 12/14/2017 1406   Sepsis Labs: @LABRCNTIP (procalcitonin:4,lacticidven:4)    MRSA PCR  Screening     Status: None   Collection Time: 12/12/17  5:14 PM  Result Value Ref Range Status   MRSA by PCR NEGATIVE NEGATIVE Final      Radiology Studies: Ct Abdomen Pelvis Wo Contrast Result Date: 12/16/2017 1. Large multicentric left rectus abdominis hematoma extending from left paraumbilical region the symphysis pubis. 2. Hepatic steatosis. 3. Mild sigmoid diverticulosis. 4. Aortic atherosclerosis.  Ir Ivc Filter Plmt / S&i /img Guid/mod Sed Result Date: 12/17/2017 Successful ultrasound and fluoroscopically guided placement of an infrarenal retrievable IVC filter via right jugular approach. PLAN: This IVC filter is potentially retrievable. The patient will be assessed for filter retrieval by Interventional Radiology in  approximately 8-12 weeks. Further recommendations regarding filter retrieval, continued surveillance or declaration of device permanence, will be made at that time.   Dg Chest Port 1 View Result Date: 12/17/2017 Minimal basilar atelectasis. Cardiomegaly and aortic atherosclerosis.     Scheduled Meds: . docusate sodium  100 mg Oral BID  . escitalopram  10 mg Oral Daily  . hydroxychloroquine  200 mg Oral BID  . ipratropium-albutero  3 mL Nebulization BID  . levothyroxine  112 mcg Oral Mon Tue Wed Thu Fri Sat  . levothyroxine  56 mcg Oral Q Sun  . metoprolol tartrate  12.5 mg Oral BID  . predniSONE  5 mg Oral BID WC   Continuous Infusions:   LOS: 8 days    Time spent: 25 minutes  Greater than 50% of the time spent on counseling and coordinating the care.   Leisa Lenz, MD [facility] Pager [phone]  If 7PM-7AM, please contact night-coverage [email or web address] Password [facility] 12/20/2017, 11:07 AM

## 2017-12-21 LAB — BASIC METABOLIC PANEL
ANION GAP: 7 (ref 5–15)
BUN: 26 mg/dL — ABNORMAL HIGH (ref 6–20)
CALCIUM: 8.3 mg/dL — AB (ref 8.9–10.3)
CO2: 27 mmol/L (ref 22–32)
Chloride: 106 mmol/L (ref 101–111)
Creatinine, Ser: 0.88 mg/dL (ref 0.44–1.00)
GLUCOSE: 94 mg/dL (ref 65–99)
POTASSIUM: 3.5 mmol/L (ref 3.5–5.1)
SODIUM: 140 mmol/L (ref 135–145)

## 2017-12-21 LAB — CBC
HEMATOCRIT: 27.6 % — AB (ref 36.0–46.0)
HEMOGLOBIN: 9 g/dL — AB (ref 12.0–15.0)
MCH: 29.4 pg (ref 26.0–34.0)
MCHC: 32.6 g/dL (ref 30.0–36.0)
MCV: 90.2 fL (ref 78.0–100.0)
Platelets: 156 10*3/uL (ref 150–400)
RBC: 3.06 MIL/uL — AB (ref 3.87–5.11)
RDW: 14.2 % (ref 11.5–15.5)
WBC: 6.7 10*3/uL (ref 4.0–10.5)

## 2017-12-21 MED ORDER — DIPHENHYDRAMINE HCL 25 MG PO CAPS: 25.0000 mg | ORAL_CAPSULE | Freq: Three times a day (TID) | ORAL | 0 refills | Status: DC | PRN

## 2017-12-21 MED ORDER — ALBUTEROL SULFATE (2.5 MG/3ML) 0.083% IN NEBU: 2.5000 mg | INHALATION_SOLUTION | RESPIRATORY_TRACT | 12 refills | Status: DC | PRN

## 2017-12-21 MED ORDER — LEVOTHYROXINE SODIUM 112 MCG PO TABS: 56.0000 ug | ORAL_TABLET | ORAL | 0 refills | Status: DC

## 2017-12-21 MED ORDER — DOCUSATE SODIUM 100 MG PO CAPS: 100.0000 mg | ORAL_CAPSULE | Freq: Two times a day (BID) | ORAL | 0 refills | Status: DC

## 2017-12-21 MED ORDER — IPRATROPIUM-ALBUTEROL 0.5-2.5 (3) MG/3ML IN SOLN: 3.0000 mL | Freq: Two times a day (BID) | RESPIRATORY_TRACT | 0 refills | Status: DC

## 2017-12-21 MED ORDER — CAMPHOR-MENTHOL 0.5-0.5 % EX LOTN: TOPICAL_LOTION | CUTANEOUS | 0 refills | Status: DC | PRN

## 2017-12-21 NOTE — Plan of Care (Signed)
  Progressing Activity: Risk for activity intolerance will decrease 12/21/2017 2225 - Progressing by Talbert Forest, RN Nutrition: Adequate nutrition will be maintained 12/21/2017 2225 - Progressing by Talbert Forest, RN Elimination: Will not experience complications related to bowel motility 12/21/2017 2225 - Progressing by Talbert Forest, RN Will not experience complications related to urinary retention 12/21/2017 2225 - Progressing by Talbert Forest, RN Pain Managment: General experience of comfort will improve 12/21/2017 2225 - Progressing by Talbert Forest, RN Safety: Ability to remain free from injury will improve 12/21/2017 2225 - Progressing by Talbert Forest, RN Skin Integrity: Risk for impaired skin integrity will decrease 12/21/2017 2225 - Progressing by Talbert Forest, RN

## 2017-12-21 NOTE — Progress Notes (Signed)
CSW following for assistance with disposition.  It has been recommended that pt admit to SNF for rehab at [place]. Referrals made, pt's choices are [facility] (no bed offer but admissions aware of referral), [facility], and [facility] (both made bed offers). However, pt does not yet have PASSR number with approval for SNF admission (PASSR request was made Friday afternoon 12/18/17 and CSW faxed requested documents today, however PASSR office closed for Holiday until 12/24/17 therefore do not expect reply until that date). Spoke with the 3 facilities listed above and all confirmed pt cannot admit to SNF until PASSR approval. Discussed barrier with pt and her 2 daughters, they are all understanding. Updated attending and RN as well.  Will continue following and assist with obtaining PASSR and facilitating transition to SNF at [place].  Sharren Bridge, MSW, LCSW Clinical Social Work 12/21/2017 [phone]

## 2017-12-21 NOTE — Discharge Summary (Addendum)
Physician Discharge Summary  Hannah Pitts IRJ:[id number] DOB: 11-Feb-1946 DOA: 12/12/2017  PCP: Tonia Ghent, MD  Admit date: 12/12/2017 Discharge date: 12/24/2017  Recommendations for Outpatient Follow-up:  1. Check CBC and BMP per SNF protocol 2. Follow up with PCP in 1-2 weeks after discharge to make sure symptoms are stable   Discharge Diagnoses:  Active Problems:   Debility   COPD with acute bronchitis (HCC)   Pulmonary embolism (HCC)   Elevated troponin   Congestive heart failure (CHF) (HCC)   Obesity hypoventilation syndrome ([place])    Discharge Condition: stable   Diet recommendation: as tolerated   History of present illness:  71 year old female with history of Sjogren's and lupus on hydroxychloroquine, obesity who has had some shortness of breath for the last 5-6 months. She became acutely more dyspneic over the last 2 days and presented to the emergency department where she was found to have submassive right pulmonary emboli with evidence of right heart strain. Her initial troponin was minimally elevated. She was started on a heparin drip and admitted to the stepdown unit. Lower extremity duplex demonstrated bilateral DVTs and echocardiogram demonstrated grade 1 diastolic dysfunction, preserved ejection fraction of 60-65%. Right ventricular systolic function was normal. There was trivial tricuspid valve regurgitation and pulmonary arterial pressure cannot be accurately estimated. It does not appear that she has significant right heart strain secondary to her submassive PE. Initially, she was transitioned over to Xarelto. Her Xarelto has been discontinued and she has been restarted on a heparin drip pending further investigation and treatment of her AKI.Marland Kitchen Pt was found to have left rectus abdominis hematoma so IVC filter placed 12/20.  Hospital Course:   Assessment & Plan:  Submassive acute bilateral pulmonary embolism with acute bilateral lower extremity  DVT - Developed rectus abdominis hematoma - Now has IVC filter, placed 12/20  - Stable   Acute on chronic CKD stage III  - Stable and Cr is WNL  Sjogren's syndrome - Stable   SLE  - Continue Plaquenil  Hypothyroidism - Continue synthroid   Chronic diastolic CHF - Compensated  - Does not required lasix on discharge, outpt follow up   DVT prophylaxis: IVC filter  Code Status: DNR/DNI  Family Communication: No family at the bedside     Consultants:   IR for IVC filter placement   Procedures:   IVC filter placed 12/20   Antimicrobials:   None      Signed:  Leisa Lenz, MD  [facility] 12/21/2017, 7:13 AM  Pager #: [phone]  Time spent in minutes: more than 30 minutes    Discharge Exam: Vitals:   12/20/17 2043 12/21/17 0503  BP: (!) 132/42 (!) 168/68  Pulse: 86 81  Resp: (!) 22 (!) 22  Temp: 99.5 F (37.5 C) 98.9 F (37.2 C)  SpO2: 93% 93%   Vitals:   12/20/17 1530 12/20/17 1932 12/20/17 2043 12/21/17 0503  BP: (!) 148/74  (!) 132/42 (!) 168/68  Pulse: 89  86 81  Resp: 20  (!) 22 (!) 22  Temp: 98 F (36.7 C)  99.5 F (37.5 C) 98.9 F (37.2 C)  TempSrc: Oral  Oral Oral  SpO2: 96% 90% 93% 93%  Weight:      Height:        General: Pt is alert, follows commands appropriately, not in acute distress Cardiovascular: Regular rate and rhythm, S1/S2 (+) Respiratory: Clear to auscultation bilaterally, no wheezing, no crackles, no rhonchi Abdominal: Soft, non tender, non distended, bowel  sounds +, no guarding Extremities: no cyanosis, pulses palpable bilaterally DP and PT Neuro: Grossly nonfocal  Discharge Instructions  Discharge Instructions    Call MD for:  persistant nausea and vomiting   Complete by:  As directed    Call MD for:  redness, tenderness, or signs of infection (pain, swelling, redness, odor or green/yellow discharge around incision site)   Complete by:  As directed    Call MD for:  severe uncontrolled  pain   Complete by:  As directed    Diet - low sodium heart healthy   Complete by:  As directed    Increase activity slowly   Complete by:  As directed      Allergies as of 12/21/2017   No Known Allergies     Medication List    TAKE these medications   acetaminophen 325 MG tablet Commonly known as:  TYLENOL Take 650 mg by mouth every 4 (four) hours as needed for mild pain.   albuterol (2.5 MG/3ML) 0.083% nebulizer solution Commonly known as:  PROVENTIL Take 3 mLs (2.5 mg total) by nebulization every 4 (four) hours as needed for wheezing or shortness of breath.   ASPIRIN LOW DOSE 81 MG EC tablet Generic drug:  aspirin Take 81 mg by mouth daily.   camphor-menthol lotion Commonly known as:  SARNA Apply topically as needed for itching.   diphenhydrAMINE 25 mg capsule Commonly known as:  BENADRYL Take 1 capsule (25 mg total) by mouth every 8 (eight) hours as needed for itching.   docusate sodium 100 MG capsule Commonly known as:  COLACE Take 1 capsule (100 mg total) by mouth 2 (two) times daily.   escitalopram 10 MG tablet Commonly known as:  LEXAPRO Take 1 tablet (10 mg total) by mouth daily.   ipratropium-albuterol 0.5-2.5 (3) MG/3ML Soln Commonly known as:  DUONEB Take 3 mLs by nebulization 2 (two) times daily.   levothyroxine 112 MCG tablet Commonly known as:  SYNTHROID, LEVOTHROID Take 0.5 tablets (56 mcg total) by mouth every Sunday. Start taking on:  12/27/2017 What changed:    how much to take  when to take this  additional instructions   loratadine 10 MG tablet Commonly known as:  CLARITIN Take 1 tablet (10 mg total) by mouth daily.   metoprolol succinate 50 MG 24 hr tablet Commonly known as:  TOPROL-XL Take 1 tablet (50 mg total) by mouth daily. Take with or immediately following a meal.   PLAQUENIL 200 MG tablet Generic drug:  hydroxychloroquine Take 200 mg by mouth 2 (two) times daily.   predniSONE 5 MG tablet Commonly known as:   DELTASONE Take 5 mg by mouth 2 (two) times daily with a meal.   ranitidine 150 MG tablet Commonly known as:  ZANTAC Take 1 tablet (150 mg total) by mouth 2 (two) times daily. What changed:    when to take this  reasons to take this   Vitamin D 1000 units capsule Take 1,000 Units by mouth daily.      Follow-up Information    Tonia Ghent, MD. Schedule an appointment as soon as possible for a visit in 1 week(s).   Specialty:  Family Medicine Contact information: [place] [address] [phone]            The results of significant diagnostics from this hospitalization (including imaging, microbiology, ancillary and laboratory) are listed below for reference.    Significant Diagnostic Studies: Ct Abdomen Pelvis Wo Contrast  Result Date: 12/16/2017 CLINICAL DATA:  71 y/o F; history of Sjogren's, lupus, pulmonary embolus, an acute kidney injury. Abdominal distention. EXAM: CT ABDOMEN AND PELVIS WITHOUT CONTRAST TECHNIQUE: Multidetector CT imaging of the abdomen and pelvis was performed following the standard protocol without IV contrast. COMPARISON:  None. FINDINGS: Lower chest: Left lower lobe platelike opacity, likely atelectasis. Hepatobiliary: Hepatic steatosis. Cholecystectomy. No biliary ductal dilatation. Pancreas: Unremarkable. No pancreatic ductal dilatation or surrounding inflammatory changes. Spleen: Normal in size without focal abnormality. Adrenals/Urinary Tract: Small bilateral renal cysts. Normal adrenal glands. No hydronephrosis. Normal bladder. Stomach/Bowel: Stomach is within normal limits. Mild sigmoid colon diverticulosis. No evidence of bowel wall thickening, distention, or inflammatory changes. Appendix not identified, no pericecal inflammation. Vascular/Lymphatic: Aortic atherosclerosis. No enlarged abdominal or pelvic lymph nodes. Reproductive: Status post hysterectomy. No adnexal masses. Other: No abdominal wall hernia or  abnormality. No abdominopelvic ascites. Musculoskeletal: Large multicentric left rectus abdominis hematoma extending from the left paraumbilical region to the symphysis pubis. IMPRESSION: 1. Large multicentric left rectus abdominis hematoma extending from left paraumbilical region the symphysis pubis. 2. Hepatic steatosis. 3. Mild sigmoid diverticulosis. 4. Aortic atherosclerosis. These results will be called to the ordering clinician or representative by the Radiologist Assistant, and communication documented in the PACS or zVision Dashboard. Electronically Signed   By: Kristine Garbe M.D.   On: 12/16/2017 19:53   Dg Chest 2 View  Result Date: 12/12/2017 CLINICAL DATA:  Acute onset of cough, shortness of breath, fatigue, fever and chills. EXAM: CHEST  2 VIEW COMPARISON:  Chest radiograph performed 07/03/2017 FINDINGS: The lungs are well-aerated and clear. There is no evidence of focal opacification, pleural effusion or pneumothorax. The heart is borderline normal in size. No acute osseous abnormalities are seen. IMPRESSION: No acute cardiopulmonary process seen. Electronically Signed   By: Garald Balding M.D.   On: 12/12/2017 05:38   Ct Angio Chest Pe W And/or Wo Contrast  Result Date: 12/12/2017 CLINICAL DATA:  Shortness of Breath EXAM: CT ANGIOGRAPHY CHEST WITH CONTRAST TECHNIQUE: Multidetector CT imaging of the chest was performed using the standard protocol during bolus administration of intravenous contrast. Multiplanar CT image reconstructions and MIPs were obtained to evaluate the vascular anatomy. CONTRAST:  34mL ISOVUE-370 COMPARISON:  Chest x-ray from earlier in the same day. FINDINGS: Cardiovascular: The thoracic aorta demonstrates atherosclerotic calcifications without evidence of dissection. No significant and this aneurysmal dilatation is seen. Pulmonary artery is well visualized and demonstrates multiple filling defects primarily within the left lower lobe consistent with  pulmonary emboli. There is elevation of the RV/LV ratio to 1. Mediastinum/Nodes: No hilar or mediastinal adenopathy is noted. The thoracic inlet is within normal limits. The esophagus is unremarkable. Lungs/Pleura: Lungs are well aerated bilaterally with evidence of mild bibasilar atelectasis. No focal confluent infiltrate is seen. No sizable effusion is noted. No pneumothorax is seen. No pulmonary non is noted. Upper Abdomen: Changes consistent with prior cholecystectomy. Mild fatty infiltration of the liver is seen. Mild renal cystic changes are noted. Musculoskeletal: Mild degenerative changes of thoracic spine are seen. Review of the MIP images confirms the above findings. IMPRESSION: Positive for acute PE with CT evidence of right heart strain (RV/LV Ratio = 1) consistent with at least submassive (intermediate risk) PE. The presence of right heart strain has been associated with an increased risk of morbidity and mortality. Please activate Code PE by paging [phone]. Critical Value/emergent results were called by telephone at the time of interpretation on 12/12/2017 at 9:15 am to Dr. Lita Mains, who verbally  acknowledged these results. Aortic Atherosclerosis (ICD10-I70.0). Electronically Signed   By: Inez Catalina M.D.   On: 12/12/2017 09:17   Ir Ivc Filter Plmt / S&i /img Guid/mod Sed  Result Date: 12/17/2017 INDICATION: History of Sjogren's syndrome and lupus who was recently admitted to [facility] with increasing dyspnea and subsequently found to have right-sided pulmonary embolism and bilateral lower extremity DVTs. The patient was placed on anti coagulation, however subsequently developed a large rectus sheath hematoma and as such, request is made for IVC filter placement for the purposes of temporary caval interruption. EXAM: ULTRASOUND GUIDANCE FOR VASCULAR ACCESS IVC CATHETERIZATION AND VENOGRAM IVC FILTER INSERTION COMPARISON:  CT abdomen pelvis - 12/16/2017; chest CTA - 12/12/2017  MEDICATIONS: None. ANESTHESIA/SEDATION: Fentanyl 100 mcg IV; Versed 2 mg IV Sedation Time: 16 minutes; The patient was continuously monitored during the procedure by the interventional radiology nurse under my direct supervision. CONTRAST:  10mL ISOVUE-300 IOPAMIDOL (ISOVUE-300) INJECTION 61% FLUOROSCOPY TIME:  1 minutes 42 seconds (341 mGy) COMPLICATIONS: None immediate PROCEDURE: Informed consent was obtained from the patient following explanation of the procedure, risks, benefits and alternatives. The patient understands, agrees and consents for the procedure. All questions were addressed. A time out was performed prior to the initiation of the procedure. Maximal barrier sterile technique utilized including caps, mask, sterile gowns, sterile gloves, large sterile drape, hand hygiene, and Betadine prep. Under sterile condition and local anesthesia, right internal jugular venous access was performed with ultrasound. An ultrasound image was saved and sent to PACS. Over a guidewire, the IVC filter delivery sheath and inner dilator were advanced into the IVC just above the IVC bifurcation. Contrast injection was performed for an IVC venogram. Through the delivery sheath, a retrievable Denali IVC filter was deployed below the level of the renal veins and above the IVC bifurcation. Limited post deployment venacavagram was performed. The delivery sheath was removed and hemostasis was obtained with manual compression. A dressing was placed. The patient tolerated the procedure well without immediate post procedural complication. FINDINGS: The IVC is patent. No evidence of thrombus, stenosis, or occlusion. No variant venous anatomy. Successful placement of the IVC filter below the level of the renal veins. IMPRESSION: Successful ultrasound and fluoroscopically guided placement of an infrarenal retrievable IVC filter via right jugular approach. PLAN: This IVC filter is potentially retrievable. The patient will be assessed  for filter retrieval by Interventional Radiology in approximately 8-12 weeks. Further recommendations regarding filter retrieval, continued surveillance or declaration of device permanence, will be made at that time. Electronically Signed   By: Sandi Mariscal M.D.   On: 12/17/2017 17:19   US Renal  Result Date: 12/14/2017 CLINICAL DATA:  Acute kidney injury. EXAM: RENAL / URINARY TRACT ULTRASOUND COMPLETE COMPARISON:  Included portion from chest CT 12/12/2017 FINDINGS: Right Kidney: Length: 8.8 cm. Diffuse thinning of renal parenchyma. Echogenicity within normal limits. No mass or hydronephrosis visualized. Cyst in the upper right kidney on prior CT is not visualize sonographically. Left Kidney: Length: 8.9 cm. Diffuse thinning of renal parenchyma. Echogenicity within normal limits. No mass or hydronephrosis visualized. Bladder: Only minimally distended. Appears normal for degree of bladder distention. IMPRESSION: Renal atrophy.  No hydronephrosis. Electronically Signed   By: Jeb Levering M.D.   On: 12/14/2017 21:48   Dg Chest Port 1 View  Result Date: 12/17/2017 CLINICAL DATA:  Hypoxia EXAM: PORTABLE CHEST 1 VIEW COMPARISON:  12/12/2017 FINDINGS: Left ventricular prominence. Aortic atherosclerosis and unfolding. Upper lungs are clear. Minimal basilar atelectasis. No change since  previous study. IMPRESSION: Minimal basilar atelectasis. Cardiomegaly and aortic atherosclerosis. Electronically Signed   By: Nelson Chimes M.D.   On: 12/17/2017 07:51    Microbiology: Recent Results (from the past 240 hour(s))  MRSA PCR Screening     Status: None   Collection Time: 12/12/17  5:14 PM  Result Value Ref Range Status   MRSA by PCR NEGATIVE NEGATIVE Final    Comment:        The GeneXpert MRSA Assay (FDA approved for NASAL specimens only), is one component of a comprehensive MRSA colonization surveillance program. It is not intended to diagnose MRSA infection nor to guide or monitor treatment for MRSA  infections.      Labs: Basic Metabolic Panel: Recent Labs  Lab 12/17/17 0405 12/18/17 0414 12/19/17 0517 12/20/17 0529 12/21/17 0430  NA 141 140 139 142 140  K 3.9 3.9 3.6 3.8 3.5  CL 111 109 104 107 106  CO2 25 26 27 28 27   GLUCOSE 89 91 98 84 94  BUN 34* 29* 26* 24* 26*  CREATININE 1.21* 0.96 0.91 0.83 0.88  CALCIUM 7.8* 8.1* 8.2* 8.1* 8.3*   Liver Function Tests: No results for input(s): AST, ALT, ALKPHOS, BILITOT, PROT, ALBUMIN in the last 168 hours. No results for input(s): LIPASE, AMYLASE in the last 168 hours. No results for input(s): AMMONIA in the last 168 hours. CBC: Recent Labs  Lab 12/17/17 0405  12/18/17 0414  12/19/17 0517  12/19/17 1624 12/19/17 2248 12/20/17 0529 12/20/17 1033 12/21/17 0430  WBC 9.0  --  7.9  --  6.5  --   --   --  6.9  --  6.7  HGB 6.4*   < > 8.5*   < > 8.8*   < > 8.7* 8.6* 8.9* 9.4* 9.0*  HCT 19.3*   < > 25.8*   < > 26.0*   < > 27.1* 27.2* 26.8* 29.2* 27.6*  MCV 92.8  --  89.6  --  90.3  --   --   --  90.5  --  90.2  PLT 122*  --  129*  --  136*  --   --   --  155  --  156   < > = values in this interval not displayed.   Cardiac Enzymes: No results for input(s): CKTOTAL, CKMB, CKMBINDEX, TROPONINI in the last 168 hours. BNP: BNP (last 3 results) No results for input(s): BNP in the last 8760 hours.  ProBNP (last 3 results) No results for input(s): PROBNP in the last 8760 hours.  CBG: No results for input(s): GLUCAP in the last 168 hours.

## 2017-12-21 NOTE — Discharge Instructions (Signed)

## 2017-12-21 NOTE — Progress Notes (Signed)
Notified by CCMD that pt's O2 was 78%. Pt sleeping upon approach. O2 applied at 2L. O2 92% at this time. Will continue to monitor.

## 2017-12-22 ENCOUNTER — Encounter (HOSPITAL_COMMUNITY): Payer: Self-pay

## 2017-12-22 LAB — BASIC METABOLIC PANEL
ANION GAP: 8 (ref 5–15)
BUN: 26 mg/dL — ABNORMAL HIGH (ref 6–20)
CHLORIDE: 107 mmol/L (ref 101–111)
CO2: 26 mmol/L (ref 22–32)
Calcium: 8.6 mg/dL — ABNORMAL LOW (ref 8.9–10.3)
Creatinine, Ser: 0.95 mg/dL (ref 0.44–1.00)
GFR calc non Af Amer: 58 mL/min — ABNORMAL LOW (ref >60)
Glucose, Bld: 104 mg/dL — ABNORMAL HIGH (ref 65–99)
POTASSIUM: 3.8 mmol/L (ref 3.5–5.1)
SODIUM: 141 mmol/L (ref 135–145)

## 2017-12-22 NOTE — Progress Notes (Signed)
Pt medically stable for discharge. Please refer to discharge summary completed 12/21/2017.  Leisa Lenz [facility] [phone]

## 2017-12-23 LAB — BASIC METABOLIC PANEL
ANION GAP: 5 (ref 5–15)
BUN: 25 mg/dL — ABNORMAL HIGH (ref 6–20)
CALCIUM: 8.4 mg/dL — AB (ref 8.9–10.3)
CHLORIDE: 109 mmol/L (ref 101–111)
CO2: 28 mmol/L (ref 22–32)
CREATININE: 0.96 mg/dL (ref 0.44–1.00)
GFR calc non Af Amer: 58 mL/min — ABNORMAL LOW (ref >60)
Glucose, Bld: 84 mg/dL (ref 65–99)
Potassium: 3.7 mmol/L (ref 3.5–5.1)
Sodium: 142 mmol/L (ref 135–145)

## 2017-12-23 NOTE — Progress Notes (Signed)
CSW following to assist with transition to SNF at DC. Pt awaiting PASSR# prior to being able to admit to SNF.  [facility] is pt's 1st preference- per admissions, currently expecting bed availability end of the week.  Pt's 2nd choices are [facility] and [facility], both of which expect bed availability as well. Updated pt's daughter- she is aware of barrier and prepared to move forward with SNF admission once PASSR # received.   Sharren Bridge, MSW, LCSW Clinical Social Work 12/23/2017 [phone]

## 2017-12-23 NOTE — Progress Notes (Signed)
Patient ID: Hannah Pitts, female   DOB: 1946/10/08, 71 y.o.   MRN: [id number]  PROGRESS NOTE    Hannah Pitts  GYB:[id number] DOB: [birth year] DOA: 12/12/2017  PCP: Tonia Ghent, MD   Brief Narrative:  71 year old female with history of Sjogren's and lupus on hydroxychloroquine, obesity who has had some shortness of breath for the last 5-6 months. She became acutely more dyspneic over the last 2 days and presented to the emergency department where she was found to have submassive right pulmonary emboli with evidence of right heart strain. Her initial troponin was minimally elevated. She was started on a heparin drip and admitted to the stepdown unit. Lower extremity duplex demonstrated bilateral DVTs and echocardiogram demonstrated grade 1 diastolic dysfunction, preserved ejection fraction of 60-65%. Right ventricular systolic function was normal. There was trivial tricuspid valve regurgitation and pulmonary arterial pressure cannot be accurately estimated. It does not appear that she has significant right heart strain secondary to her submassive PE. Initially, she was transitioned over to Xarelto. Her Xarelto has been discontinued and she has been restarted on a heparin drip pending further investigation and treatment of her AKI.Marland Kitchen Pt was found to have left rectus abdominis hematoma so IVC filter placed 12/20.  Assessment & Plan:   Submassive acute bilateral pulmonary embolism with acute bilateral lower extremity DVT - Developed rectus abdominis hematoma - IVC filter placed 12/20  Acute on chronic CKD stage III - Cr WNL  Sjogren's syndrome - Stable   SLE - Continue Plaquenil  Hypothyroidism - Continue Plaquenil     DVT prophylaxis: IVC filter  Code Status: DNR/DNI Family Communication: no family at the bedside  Disposition Plan: to SNF once bed available    Consultants:   IR  Procedures:  IVC filter placed 12/20  Antimicrobials:   None     Subjective: No overnight events.   Objective: Vitals:   12/22/17 2015 12/22/17 2026 12/23/17 0410 12/23/17 0751  BP: (!) 148/70  (!) 194/83 (!) 158/72  Pulse: 75  67 71  Resp: 18  18   Temp: 98.6 F (37 C)  97.8 F (36.6 C)   TempSrc: Oral  Oral   SpO2: 96% 95% 98%   Weight:      Height:        Intake/Output Summary (Last 24 hours) at 12/23/2017 0757 Last data filed at 12/23/2017 0600 Gross per 24 hour  Intake 380 ml  Output -  Net 380 ml   Filed Weights   12/12/17 0445 12/15/17 1357 12/19/17 0405  Weight: 108.9 kg (240 lb) 110 kg (242 lb 8.1 oz) 98.3 kg (216 lb 11.4 oz)    Examination:  General exam: Appears calm and comfortable  Respiratory system: Clear to auscultation. Respiratory effort normal. Cardiovascular system: S1 & S2 heard, RRR. Gastrointestinal system: Abdomen is nondistended, soft and nontender. No organomegaly or masses felt. Normal bowel sounds heard. Central nervous system: Alert and oriented. No focal neurological deficits. Extremities: Ecchymoses on UE, palpable pulses  Skin: No rashes, lesions or ulcers Psychiatry: Judgement and insight appear normal. Mood & affect appropriate.   Data Reviewed: I have personally reviewed following labs and imaging studies  CBC: Recent Labs  Lab 12/17/17 0405  12/18/17 0414  12/19/17 0517  12/19/17 1624 12/19/17 2248 12/20/17 0529 12/20/17 1033 12/21/17 0430  WBC 9.0  --  7.9  --  6.5  --   --   --  6.9  --  6.7  HGB 6.4*   < >  8.5*   < > 8.8*   < > 8.7* 8.6* 8.9* 9.4* 9.0*  HCT 19.3*   < > 25.8*   < > 26.0*   < > 27.1* 27.2* 26.8* 29.2* 27.6*  MCV 92.8  --  89.6  --  90.3  --   --   --  90.5  --  90.2  PLT 122*  --  129*  --  136*  --   --   --  155  --  156   < > = values in this interval not displayed.   Basic Metabolic Panel: Recent Labs  Lab 12/19/17 0517 12/20/17 0529 12/21/17 0430 12/22/17 0436 12/23/17 0420  NA 139 142 140 141 142  K 3.6 3.8 3.5 3.8 3.7  CL 104 107 106 107 109   CO2 27 28 27 26 28   GLUCOSE 98 84 94 104* 84  BUN 26* 24* 26* 26* 25*  CREATININE 0.91 0.83 0.88 0.95 0.96  CALCIUM 8.2* 8.1* 8.3* 8.6* 8.4*   GFR: Estimated Creatinine Clearance: 61.5 mL/min (by C-G formula based on SCr of 0.96 mg/dL). Liver Function Tests: No results for input(s): AST, ALT, ALKPHOS, BILITOT, PROT, ALBUMIN in the last 168 hours. No results for input(s): LIPASE, AMYLASE in the last 168 hours. No results for input(s): AMMONIA in the last 168 hours. Coagulation Profile: No results for input(s): INR, PROTIME in the last 168 hours. Cardiac Enzymes: No results for input(s): CKTOTAL, CKMB, CKMBINDEX, TROPONINI in the last 168 hours. BNP (last 3 results) No results for input(s): PROBNP in the last 8760 hours. HbA1C: No results for input(s): HGBA1C in the last 72 hours. CBG: No results for input(s): GLUCAP in the last 168 hours. Lipid Profile: No results for input(s): CHOL, HDL, LDLCALC, TRIG, CHOLHDL, LDLDIRECT in the last 72 hours. Thyroid Function Tests: No results for input(s): TSH, T4TOTAL, FREET4, T3FREE, THYROIDAB in the last 72 hours. Anemia Panel: No results for input(s): VITAMINB12, FOLATE, FERRITIN, TIBC, IRON, RETICCTPCT in the last 72 hours. Urine analysis:    Component Value Date/Time   COLORURINE YELLOW 12/14/2017 1406   APPEARANCEUR CLEAR 12/14/2017 1406   LABSPEC 1.014 12/14/2017 1406   PHURINE 5.0 12/14/2017 1406   GLUCOSEU NEGATIVE 12/14/2017 1406   HGBUR NEGATIVE 12/14/2017 1406   HGBUR negative 12/27/2010 0801   BILIRUBINUR NEGATIVE 12/14/2017 1406   BILIRUBINUR Neg 05/20/2013 1010   KETONESUR NEGATIVE 12/14/2017 1406   PROTEINUR NEGATIVE 12/14/2017 1406   UROBILINOGEN negative 05/20/2013 1010   UROBILINOGEN 0.2 12/27/2010 0801   NITRITE NEGATIVE 12/14/2017 1406   LEUKOCYTESUR NEGATIVE 12/14/2017 1406   Sepsis Labs: @LABRCNTIP (procalcitonin:4,lacticidven:4)   )No results found for this or any previous visit (from the past 240  hour(s)).    Radiology Studies: No results found.    Scheduled Meds: . docusate sodium  100 mg Oral BID  . escitalopram  10 mg Oral Daily  . hydroxychloroquine  200 mg Oral BID  . ipratropium-albuterol  3 mL Nebulization BID  . levothyroxine  112 mcg Oral Once per day on Mon Tue Wed Thu Fri Sat  . levothyroxine  56 mcg Oral Q Sun  . mouth rinse  15 mL Mouth Rinse BID  . metoprolol tartrate  12.5 mg Oral BID  . predniSONE  5 mg Oral BID WC   Continuous Infusions:   LOS: 11 days    Time spent: 25 minutes  Greater than 50% of the time spent on counseling and coordinating the care.   Leisa Lenz, MD [facility] Pager [phone]  If 7PM-7AM, please contact night-coverage [email or web address] Password [facility] 12/23/2017, 7:57 AM

## 2017-12-23 NOTE — Plan of Care (Signed)
  Activity: Risk for activity intolerance will decrease 12/23/2017 1225 - Progressing by Bertrum Sol, RN  Able to ambulate to the bathroom with a walker.

## 2017-12-24 DIAGNOSIS — Z23 Encounter for immunization: Secondary | ICD-10-CM | POA: Diagnosis not present

## 2017-12-24 LAB — BASIC METABOLIC PANEL
ANION GAP: 5 (ref 5–15)
BUN: 26 mg/dL — ABNORMAL HIGH (ref 6–20)
CALCIUM: 8.3 mg/dL — AB (ref 8.9–10.3)
CHLORIDE: 106 mmol/L (ref 101–111)
CO2: 28 mmol/L (ref 22–32)
CREATININE: 1 mg/dL (ref 0.44–1.00)
GFR calc non Af Amer: 55 mL/min — ABNORMAL LOW (ref >60)
Glucose, Bld: 92 mg/dL (ref 65–99)
Potassium: 3.5 mmol/L (ref 3.5–5.1)
SODIUM: 139 mmol/L (ref 135–145)

## 2017-12-24 NOTE — Progress Notes (Signed)
CSW spoke with patient at bedside regarding discharge planning. CSW informed patient that the current barrier is not having a PASRR number, patient verbalized understanding. Patient reported that she prefers to discharge home. Patient reports that she resides with her daughter and that her granddaughter will be able to stay with her during the day while her daughter is at work. Patient reported that she is agreeable to HHPT. Patient reported that she is able to arrange transportation to return home. CSW informed patient's RNCM. Patient discharging home. CSW signing off, no other needs identified at this time.  Abundio Miu, [place] Social Worker [facility] Cell#: [phone]

## 2017-12-24 NOTE — Progress Notes (Addendum)
Received a call from [facility], patient now wishes to d/c home. Contacted attending, obtained [facility] orders, patient with no preference for agency. Contacted [facility] for referral. Patient to go home with assistance from her daughter. [phone]

## 2017-12-24 NOTE — Discharge Summary (Addendum)
Physician Discharge Summary  Hannah Pitts QHU:[id number] DOB: 1945/12/31 DOA: 12/12/2017  PCP: Tonia Ghent, MD  Admit date: 12/12/2017 Discharge date: 12/24/2017  Recommendations for Outpatient Follow-up:  Check CBC and BMP per SNF protocol  Discharge Diagnoses:  Active Problems:   Debility   COPD with acute bronchitis (HCC)   Pulmonary embolism (HCC)   Elevated troponin   Congestive heart failure (CHF) (HCC)   Obesity hypoventilation syndrome ([place])    Discharge Condition: stable   Diet recommendation: as tolerated   History of present illness:  71 year old female with history of Sjogren's and lupus on hydroxychloroquine, obesity who has had some shortness of breath for the last 5-6 months. She became acutely more dyspneic over the last 2 days and presented to the emergency department where she was found to have submassive right pulmonary emboli with evidence of right heart strain. Her initial troponin was minimally elevated. She was started on a heparin drip and admitted to the stepdown unit. Lower extremity duplex demonstrated bilateral DVTs and echocardiogram demonstrated grade 1 diastolic dysfunction, preserved ejection fraction of 60-65%. Right ventricular systolic function was normal. There was trivial tricuspid valve regurgitation and pulmonary arterial pressure cannot be accurately estimated. It does not appear that she has significant right heart strain secondary to her submassive PE. Initially, she was transitioned over to Xarelto. Her Xarelto has been discontinued and she has been restarted on a heparin drip pending further investigation and treatment of her AKI.Marland Kitchen Pt was found to have left rectus abdominis hematoma so IVC filter placed 12/20.  Hospital Course:   Assessment & Plan:  Submassive acute bilateral pulmonary embolism with acute bilateral lower extremity DVT - Developed rectus abdominis hematoma - IVC filter, placed 12/20 - Stable    Acute on chronic CKD stage III - Cr WNL  Sjogren's syndrome - Stable   SLE - Continue Plaquenil  Hypothyroidism - Continue synthroid  Chronic diastolic CHF - pt did require lasix initially but has been off of lasix now and her resp status is stable - Outpt follow up - Does not require lasix on discharge     DVT prophylaxis:IVC filter Code Status:DNR/DNI Family Communication:no family at bedside    Consultants:  IR for IVC filter placement  Procedures:  IVC filter placed 12/20  Antimicrobials:  None     Signed:  Leisa Lenz, MD  [facility] 12/24/2017, 8:33 AM  Pager #: [phone]  Time spent in minutes: more than 30 minutes   Discharge Exam: Vitals:   12/23/17 2018 12/24/17 0620  BP: (!) 127/56 (!) 155/72  Pulse: 81 64  Resp: 16 16  Temp: 98.4 F (36.9 C) 98.1 F (36.7 C)  SpO2: 94% 96%   Vitals:   12/23/17 1336 12/23/17 1941 12/23/17 2018 12/24/17 0620  BP: 140/71  (!) 127/56 (!) 155/72  Pulse: 67  81 64  Resp: 14  16 16   Temp: 98.3 F (36.8 C)  98.4 F (36.9 C) 98.1 F (36.7 C)  TempSrc: Oral  Oral Oral  SpO2: 94% 93% 94% 96%  Weight:      Height:        General: Pt is alert, follows commands appropriately, not in acute distress Cardiovascular: Regular rate and rhythm, S1/S2 + Respiratory: Clear to auscultation bilaterally, no wheezing, no crackles, no rhonchi Abdominal: Soft, non tender, non distended, bowel sounds +, no guarding Extremities: UE ecchymoses, palpable pulses  Neuro: Grossly nonfocal  Discharge Instructions  Discharge Instructions    Call MD for:  persistant  nausea and vomiting   Complete by:  As directed    Call MD for:  persistant nausea and vomiting   Complete by:  As directed    Call MD for:  redness, tenderness, or signs of infection (pain, swelling, redness, odor or green/yellow discharge around incision site)   Complete by:  As directed    Call MD for:  redness,  tenderness, or signs of infection (pain, swelling, redness, odor or green/yellow discharge around incision site)   Complete by:  As directed    Call MD for:  severe uncontrolled pain   Complete by:  As directed    Call MD for:  severe uncontrolled pain   Complete by:  As directed    Diet - low sodium heart healthy   Complete by:  As directed    Diet - low sodium heart healthy   Complete by:  As directed    Increase activity slowly   Complete by:  As directed    Increase activity slowly   Complete by:  As directed      Allergies as of 12/24/2017   No Known Allergies     Medication List    STOP taking these medications   ASPIRIN LOW DOSE 81 MG EC tablet Generic drug:  aspirin     TAKE these medications   acetaminophen 325 MG tablet Commonly known as:  TYLENOL Take 650 mg by mouth every 4 (four) hours as needed for mild pain.   albuterol (2.5 MG/3ML) 0.083% nebulizer solution Commonly known as:  PROVENTIL Take 3 mLs (2.5 mg total) by nebulization every 4 (four) hours as needed for wheezing or shortness of breath.   camphor-menthol lotion Commonly known as:  SARNA Apply topically as needed for itching.   diphenhydrAMINE 25 mg capsule Commonly known as:  BENADRYL Take 1 capsule (25 mg total) by mouth every 8 (eight) hours as needed for itching.   docusate sodium 100 MG capsule Commonly known as:  COLACE Take 1 capsule (100 mg total) by mouth 2 (two) times daily.   escitalopram 10 MG tablet Commonly known as:  LEXAPRO Take 1 tablet (10 mg total) by mouth daily.   ipratropium-albuterol 0.5-2.5 (3) MG/3ML Soln Commonly known as:  DUONEB Take 3 mLs by nebulization 2 (two) times daily.   levothyroxine 112 MCG tablet Commonly known as:  SYNTHROID, LEVOTHROID Take 0.5 tablets (56 mcg total) by mouth every Sunday. Start taking on:  12/27/2017 What changed:    how much to take  when to take this  additional instructions   loratadine 10 MG tablet Commonly known  as:  CLARITIN Take 1 tablet (10 mg total) by mouth daily.   metoprolol succinate 50 MG 24 hr tablet Commonly known as:  TOPROL-XL Take 1 tablet (50 mg total) by mouth daily. Take with or immediately following a meal.   PLAQUENIL 200 MG tablet Generic drug:  hydroxychloroquine Take 200 mg by mouth 2 (two) times daily.   predniSONE 5 MG tablet Commonly known as:  DELTASONE Take 5 mg by mouth 2 (two) times daily with a meal.   ranitidine 150 MG tablet Commonly known as:  ZANTAC Take 1 tablet (150 mg total) by mouth 2 (two) times daily. What changed:    when to take this  reasons to take this   Vitamin D 1000 units capsule Take 1,000 Units by mouth daily.      Follow-up Information    Tonia Ghent, MD. Schedule an appointment as soon as  possible for a visit in 1 week(s).   Specialty:  Family Medicine Contact information: [address] [place] [address] [phone]            The results of significant diagnostics from this hospitalization (including imaging, microbiology, ancillary and laboratory) are listed below for reference.    Significant Diagnostic Studies: Ct Abdomen Pelvis Wo Contrast  Result Date: 12/16/2017 CLINICAL DATA:  71 y/o F; history of Sjogren's, lupus, pulmonary embolus, an acute kidney injury. Abdominal distention. EXAM: CT ABDOMEN AND PELVIS WITHOUT CONTRAST TECHNIQUE: Multidetector CT imaging of the abdomen and pelvis was performed following the standard protocol without IV contrast. COMPARISON:  None. FINDINGS: Lower chest: Left lower lobe platelike opacity, likely atelectasis. Hepatobiliary: Hepatic steatosis. Cholecystectomy. No biliary ductal dilatation. Pancreas: Unremarkable. No pancreatic ductal dilatation or surrounding inflammatory changes. Spleen: Normal in size without focal abnormality. Adrenals/Urinary Tract: Small bilateral renal cysts. Normal adrenal glands. No hydronephrosis. Normal bladder. Stomach/Bowel: Stomach is  within normal limits. Mild sigmoid colon diverticulosis. No evidence of bowel wall thickening, distention, or inflammatory changes. Appendix not identified, no pericecal inflammation. Vascular/Lymphatic: Aortic atherosclerosis. No enlarged abdominal or pelvic lymph nodes. Reproductive: Status post hysterectomy. No adnexal masses. Other: No abdominal wall hernia or abnormality. No abdominopelvic ascites. Musculoskeletal: Large multicentric left rectus abdominis hematoma extending from the left paraumbilical region to the symphysis pubis. IMPRESSION: 1. Large multicentric left rectus abdominis hematoma extending from left paraumbilical region the symphysis pubis. 2. Hepatic steatosis. 3. Mild sigmoid diverticulosis. 4. Aortic atherosclerosis. These results will be called to the ordering clinician or representative by the Radiologist Assistant, and communication documented in the PACS or zVision Dashboard. Electronically Signed   By: Kristine Garbe M.D.   On: 12/16/2017 19:53   Dg Chest 2 View  Result Date: 12/12/2017 CLINICAL DATA:  Acute onset of cough, shortness of breath, fatigue, fever and chills. EXAM: CHEST  2 VIEW COMPARISON:  Chest radiograph performed 07/03/2017 FINDINGS: The lungs are well-aerated and clear. There is no evidence of focal opacification, pleural effusion or pneumothorax. The heart is borderline normal in size. No acute osseous abnormalities are seen. IMPRESSION: No acute cardiopulmonary process seen. Electronically Signed   By: Garald Balding M.D.   On: 12/12/2017 05:38   Ct Angio Chest Pe W And/or Wo Contrast  Result Date: 12/12/2017 CLINICAL DATA:  Shortness of Breath EXAM: CT ANGIOGRAPHY CHEST WITH CONTRAST TECHNIQUE: Multidetector CT imaging of the chest was performed using the standard protocol during bolus administration of intravenous contrast. Multiplanar CT image reconstructions and MIPs were obtained to evaluate the vascular anatomy. CONTRAST:  47mL ISOVUE-370  COMPARISON:  Chest x-ray from earlier in the same day. FINDINGS: Cardiovascular: The thoracic aorta demonstrates atherosclerotic calcifications without evidence of dissection. No significant and this aneurysmal dilatation is seen. Pulmonary artery is well visualized and demonstrates multiple filling defects primarily within the left lower lobe consistent with pulmonary emboli. There is elevation of the RV/LV ratio to 1. Mediastinum/Nodes: No hilar or mediastinal adenopathy is noted. The thoracic inlet is within normal limits. The esophagus is unremarkable. Lungs/Pleura: Lungs are well aerated bilaterally with evidence of mild bibasilar atelectasis. No focal confluent infiltrate is seen. No sizable effusion is noted. No pneumothorax is seen. No pulmonary non is noted. Upper Abdomen: Changes consistent with prior cholecystectomy. Mild fatty infiltration of the liver is seen. Mild renal cystic changes are noted. Musculoskeletal: Mild degenerative changes of thoracic spine are seen. Review of the MIP images confirms the above findings. IMPRESSION: Positive for acute  PE with CT evidence of right heart strain (RV/LV Ratio = 1) consistent with at least submassive (intermediate risk) PE. The presence of right heart strain has been associated with an increased risk of morbidity and mortality. Please activate Code PE by paging [phone]. Critical Value/emergent results were called by telephone at the time of interpretation on 12/12/2017 at 9:15 am to Dr. Lita Mains, who verbally acknowledged these results. Aortic Atherosclerosis (ICD10-I70.0). Electronically Signed   By: Inez Catalina M.D.   On: 12/12/2017 09:17   Ir Ivc Filter Plmt / S&i /img Guid/mod Sed  Result Date: 12/17/2017 INDICATION: History of Sjogren's syndrome and lupus who was recently admitted to [facility] with increasing dyspnea and subsequently found to have right-sided pulmonary embolism and bilateral lower extremity DVTs. The patient was  placed on anti coagulation, however subsequently developed a large rectus sheath hematoma and as such, request is made for IVC filter placement for the purposes of temporary caval interruption. EXAM: ULTRASOUND GUIDANCE FOR VASCULAR ACCESS IVC CATHETERIZATION AND VENOGRAM IVC FILTER INSERTION COMPARISON:  CT abdomen pelvis - 12/16/2017; chest CTA - 12/12/2017 MEDICATIONS: None. ANESTHESIA/SEDATION: Fentanyl 100 mcg IV; Versed 2 mg IV Sedation Time: 16 minutes; The patient was continuously monitored during the procedure by the interventional radiology nurse under my direct supervision. CONTRAST:  41mL ISOVUE-300 IOPAMIDOL (ISOVUE-300) INJECTION 61% FLUOROSCOPY TIME:  1 minutes 42 seconds (563 mGy) COMPLICATIONS: None immediate PROCEDURE: Informed consent was obtained from the patient following explanation of the procedure, risks, benefits and alternatives. The patient understands, agrees and consents for the procedure. All questions were addressed. A time out was performed prior to the initiation of the procedure. Maximal barrier sterile technique utilized including caps, mask, sterile gowns, sterile gloves, large sterile drape, hand hygiene, and Betadine prep. Under sterile condition and local anesthesia, right internal jugular venous access was performed with ultrasound. An ultrasound image was saved and sent to PACS. Over a guidewire, the IVC filter delivery sheath and inner dilator were advanced into the IVC just above the IVC bifurcation. Contrast injection was performed for an IVC venogram. Through the delivery sheath, a retrievable Denali IVC filter was deployed below the level of the renal veins and above the IVC bifurcation. Limited post deployment venacavagram was performed. The delivery sheath was removed and hemostasis was obtained with manual compression. A dressing was placed. The patient tolerated the procedure well without immediate post procedural complication. FINDINGS: The IVC is patent. No  evidence of thrombus, stenosis, or occlusion. No variant venous anatomy. Successful placement of the IVC filter below the level of the renal veins. IMPRESSION: Successful ultrasound and fluoroscopically guided placement of an infrarenal retrievable IVC filter via right jugular approach. PLAN: This IVC filter is potentially retrievable. The patient will be assessed for filter retrieval by Interventional Radiology in approximately 8-12 weeks. Further recommendations regarding filter retrieval, continued surveillance or declaration of device permanence, will be made at that time. Electronically Signed   By: Sandi Mariscal M.D.   On: 12/17/2017 17:19   US Renal  Result Date: 12/14/2017 CLINICAL DATA:  Acute kidney injury. EXAM: RENAL / URINARY TRACT ULTRASOUND COMPLETE COMPARISON:  Included portion from chest CT 12/12/2017 FINDINGS: Right Kidney: Length: 8.8 cm. Diffuse thinning of renal parenchyma. Echogenicity within normal limits. No mass or hydronephrosis visualized. Cyst in the upper right kidney on prior CT is not visualize sonographically. Left Kidney: Length: 8.9 cm. Diffuse thinning of renal parenchyma. Echogenicity within normal limits. No mass or hydronephrosis visualized. Bladder: Only minimally distended. Appears normal  for degree of bladder distention. IMPRESSION: Renal atrophy.  No hydronephrosis. Electronically Signed   By: Jeb Levering M.D.   On: 12/14/2017 21:48   Dg Chest Port 1 View  Result Date: 12/17/2017 CLINICAL DATA:  Hypoxia EXAM: PORTABLE CHEST 1 VIEW COMPARISON:  12/12/2017 FINDINGS: Left ventricular prominence. Aortic atherosclerosis and unfolding. Upper lungs are clear. Minimal basilar atelectasis. No change since previous study. IMPRESSION: Minimal basilar atelectasis. Cardiomegaly and aortic atherosclerosis. Electronically Signed   By: Nelson Chimes M.D.   On: 12/17/2017 07:51    Microbiology: No results found for this or any previous visit (from the past 240 hour(s)).    Labs: Basic Metabolic Panel: Recent Labs  Lab 12/20/17 0529 12/21/17 0430 12/22/17 0436 12/23/17 0420 12/24/17 0405  NA 142 140 141 142 139  K 3.8 3.5 3.8 3.7 3.5  CL 107 106 107 109 106  CO2 28 27 26 28 28   GLUCOSE 84 94 104* 84 92  BUN 24* 26* 26* 25* 26*  CREATININE 0.83 0.88 0.95 0.96 1.00  CALCIUM 8.1* 8.3* 8.6* 8.4* 8.3*   Liver Function Tests: No results for input(s): AST, ALT, ALKPHOS, BILITOT, PROT, ALBUMIN in the last 168 hours. No results for input(s): LIPASE, AMYLASE in the last 168 hours. No results for input(s): AMMONIA in the last 168 hours. CBC: Recent Labs  Lab 12/18/17 0414  12/19/17 0517  12/19/17 1624 12/19/17 2248 12/20/17 0529 12/20/17 1033 12/21/17 0430  WBC 7.9  --  6.5  --   --   --  6.9  --  6.7  HGB 8.5*   < > 8.8*   < > 8.7* 8.6* 8.9* 9.4* 9.0*  HCT 25.8*   < > 26.0*   < > 27.1* 27.2* 26.8* 29.2* 27.6*  MCV 89.6  --  90.3  --   --   --  90.5  --  90.2  PLT 129*  --  136*  --   --   --  155  --  156   < > = values in this interval not displayed.   Cardiac Enzymes: No results for input(s): CKTOTAL, CKMB, CKMBINDEX, TROPONINI in the last 168 hours. BNP: BNP (last 3 results) No results for input(s): BNP in the last 8760 hours.  ProBNP (last 3 results) No results for input(s): PROBNP in the last 8760 hours.  CBG: No results for input(s): GLUCAP in the last 168 hours.

## 2017-12-25 ENCOUNTER — Telehealth: Payer: Self-pay | Admitting: *Deleted

## 2017-12-25 NOTE — Telephone Encounter (Signed)
Copied from [place] [id number]. Topic: Quick Communication - Office Called Patient >> Dec 25, 2017  8:51 AM Modena Nunnery, CMA wrote: Reason for CRM: Lm requesting return call to complete TCM and confirm hosp f/u appt

## 2017-12-26 DIAGNOSIS — I2699 Other pulmonary embolism without acute cor pulmonale: Secondary | ICD-10-CM | POA: Diagnosis not present

## 2017-12-26 DIAGNOSIS — I82403 Acute embolism and thrombosis of unspecified deep veins of lower extremity, bilateral: Secondary | ICD-10-CM | POA: Diagnosis not present

## 2017-12-26 DIAGNOSIS — I130 Hypertensive heart and chronic kidney disease with heart failure and stage 1 through stage 4 chronic kidney disease, or unspecified chronic kidney disease: Secondary | ICD-10-CM | POA: Diagnosis not present

## 2017-12-26 DIAGNOSIS — S301XXD Contusion of abdominal wall, subsequent encounter: Secondary | ICD-10-CM | POA: Diagnosis not present

## 2017-12-26 DIAGNOSIS — Z48812 Encounter for surgical aftercare following surgery on the circulatory system: Secondary | ICD-10-CM | POA: Diagnosis not present

## 2017-12-28 ENCOUNTER — Encounter: Payer: Self-pay | Admitting: Family Medicine

## 2017-12-28 ENCOUNTER — Ambulatory Visit (INDEPENDENT_AMBULATORY_CARE_PROVIDER_SITE_OTHER): Payer: Medicare Other | Admitting: Family Medicine

## 2017-12-28 VITALS — BP 118/64 | HR 64 | Temp 98.6°F | Wt 234.5 lb

## 2017-12-28 DIAGNOSIS — D649 Anemia, unspecified: Secondary | ICD-10-CM | POA: Diagnosis not present

## 2017-12-28 DIAGNOSIS — I2699 Other pulmonary embolism without acute cor pulmonale: Secondary | ICD-10-CM | POA: Diagnosis not present

## 2017-12-28 LAB — CBC WITH DIFFERENTIAL/PLATELET
BASOS ABS: 0 10*3/uL (ref 0.0–0.1)
Basophils Relative: 0.5 % (ref 0.0–3.0)
EOS PCT: 0.8 % (ref 0.0–5.0)
Eosinophils Absolute: 0 10*3/uL (ref 0.0–0.7)
HCT: 33.5 % — ABNORMAL LOW (ref 36.0–46.0)
HEMOGLOBIN: 10.8 g/dL — AB (ref 12.0–15.0)
LYMPHS ABS: 1.1 10*3/uL (ref 0.7–4.0)
Lymphocytes Relative: 18.4 % (ref 12.0–46.0)
MCHC: 32.2 g/dL (ref 30.0–36.0)
MCV: 92.3 fl (ref 78.0–100.0)
MONO ABS: 0.6 10*3/uL (ref 0.1–1.0)
MONOS PCT: 10.3 % (ref 3.0–12.0)
NEUTROS PCT: 70 % (ref 43.0–77.0)
Neutro Abs: 4.1 10*3/uL (ref 1.4–7.7)
Platelets: 185 10*3/uL (ref 150.0–400.0)
RBC: 3.63 Mil/uL — AB (ref 3.87–5.11)
RDW: 15.7 % — ABNORMAL HIGH (ref 11.5–15.5)
WBC: 5.9 10*3/uL (ref 4.0–10.5)

## 2017-12-28 LAB — BASIC METABOLIC PANEL
BUN: 27 mg/dL — ABNORMAL HIGH (ref 6–23)
CALCIUM: 9 mg/dL (ref 8.4–10.5)
CO2: 29 mEq/L (ref 19–32)
Chloride: 105 mEq/L (ref 96–112)
Creatinine, Ser: 1.24 mg/dL — ABNORMAL HIGH (ref 0.40–1.20)
GFR: 45.08 mL/min — AB (ref >60.00)
GLUCOSE: 92 mg/dL (ref 70–99)
POTASSIUM: 4.1 meq/L (ref 3.5–5.1)
SODIUM: 143 meq/L (ref 135–145)

## 2017-12-28 LAB — IRON: Iron: 103 ug/dL (ref 42–145)

## 2017-12-28 MED ORDER — LEVOTHYROXINE SODIUM 112 MCG PO TABS: ORAL_TABLET | ORAL | Status: DC

## 2017-12-28 NOTE — Progress Notes (Signed)
S/p PE.  Inpatient course d/w pt.  Off anticoagulation.  She has filter placed.  She had sig bruising on anticoagulation.  She had 1 unit PRBC while inpatient.   She hasn't needed nebs yet- the machine is coming in today.  She hasn't used them since discharge yet.    CKD noted prev, due for f/u labs.  dw pt.    She has walked the most today since coming home from a hospital.  She is using a walker at baseline now.  She has home PT set up with return visits pending.  She is doing home exercises.    PMH and SH reviewed  ROS: Per HPI unless specifically indicated in ROS section   Meds, vitals, and allergies reviewed.   GEN: nad, alert and oriented HEENT: mucous membranes moist NECK: supple w/o LA CV: rrr.  PULM: ctab, no inc wob ABD: soft, +bs EXT: no edema, calf not ttp B SKIN: no acute rash but bruising noted especially on the right arm and the lower abdominal wall

## 2017-12-28 NOTE — Patient Instructions (Signed)
We'll contact you with your lab report. Don't change your meds for now.  Keep using your walker and working with PT.  Update me as needed.  We'll see about follow up when I get your labs back.  Take care.  Glad to see you.

## 2017-12-30 ENCOUNTER — Other Ambulatory Visit: Payer: Self-pay | Admitting: Family Medicine

## 2017-12-30 ENCOUNTER — Telehealth: Payer: Self-pay | Admitting: *Deleted

## 2017-12-30 DIAGNOSIS — Z48812 Encounter for surgical aftercare following surgery on the circulatory system: Secondary | ICD-10-CM | POA: Diagnosis not present

## 2017-12-30 DIAGNOSIS — I130 Hypertensive heart and chronic kidney disease with heart failure and stage 1 through stage 4 chronic kidney disease, or unspecified chronic kidney disease: Secondary | ICD-10-CM | POA: Diagnosis not present

## 2017-12-30 DIAGNOSIS — I2699 Other pulmonary embolism without acute cor pulmonale: Secondary | ICD-10-CM

## 2017-12-30 DIAGNOSIS — I82403 Acute embolism and thrombosis of unspecified deep veins of lower extremity, bilateral: Secondary | ICD-10-CM | POA: Diagnosis not present

## 2017-12-30 DIAGNOSIS — S301XXD Contusion of abdominal wall, subsequent encounter: Secondary | ICD-10-CM | POA: Diagnosis not present

## 2017-12-30 NOTE — Telephone Encounter (Signed)
Verbal order given to [facility] by telephone and verbalized understanding.

## 2017-12-30 NOTE — Telephone Encounter (Signed)
Copied from [place] [id number]. Topic: General - Other >> Dec 30, 2017 12:32 PM Synthia Innocent wrote: Reason for CRM: Needing verbal orders for OT, 1 x a week for 2 weeks, 0 x a week for 1 week, 1 x a week for 1 week for ADL, tranfers, excerise, Heart failure education, ok to DC when goal met

## 2017-12-30 NOTE — Assessment & Plan Note (Signed)
She had a pulmonary embolism, admitted, unable to tolerate anticoagulation, filter placed.  Off anticoagulation now.  She had significant bruising and required transfusion.  Recheck hemoglobin today given her history of anemia.  Recheck routine labs today, including her creatinine.  We talked about the pathophysiology of pulmonary embolism/DVT.  She likely does not have an inherited issue given this being her first episode.  Likely a combination of relative immobility and possible contribution from lupus.  We talked about the fact that occult malignancy can present with pulmonary embolism, however she had no obvious findings suggestive of occult malignancy on her workup as inpatient and it would likely be more hazardous to put her through further invasive testing at this point, such as colonoscopy.  She agrees.  In the meantime continue with physical therapy given her relative stability.  See notes on labs.  She agrees. >25 minutes spent in face to face time with patient, >50% spent in counselling or coordination of care.

## 2017-12-30 NOTE — Telephone Encounter (Signed)
Please give the order.  Thanks.   

## 2018-01-01 DIAGNOSIS — I82403 Acute embolism and thrombosis of unspecified deep veins of lower extremity, bilateral: Secondary | ICD-10-CM | POA: Diagnosis not present

## 2018-01-01 DIAGNOSIS — Z48812 Encounter for surgical aftercare following surgery on the circulatory system: Secondary | ICD-10-CM | POA: Diagnosis not present

## 2018-01-01 DIAGNOSIS — I2699 Other pulmonary embolism without acute cor pulmonale: Secondary | ICD-10-CM | POA: Diagnosis not present

## 2018-01-01 DIAGNOSIS — S301XXD Contusion of abdominal wall, subsequent encounter: Secondary | ICD-10-CM | POA: Diagnosis not present

## 2018-01-01 DIAGNOSIS — I130 Hypertensive heart and chronic kidney disease with heart failure and stage 1 through stage 4 chronic kidney disease, or unspecified chronic kidney disease: Secondary | ICD-10-CM | POA: Diagnosis not present

## 2018-01-05 ENCOUNTER — Inpatient Hospital Stay: Payer: Medicare Other | Admitting: Family Medicine

## 2018-01-07 DIAGNOSIS — I130 Hypertensive heart and chronic kidney disease with heart failure and stage 1 through stage 4 chronic kidney disease, or unspecified chronic kidney disease: Secondary | ICD-10-CM | POA: Diagnosis not present

## 2018-01-07 DIAGNOSIS — I2699 Other pulmonary embolism without acute cor pulmonale: Secondary | ICD-10-CM | POA: Diagnosis not present

## 2018-01-07 DIAGNOSIS — I82403 Acute embolism and thrombosis of unspecified deep veins of lower extremity, bilateral: Secondary | ICD-10-CM | POA: Diagnosis not present

## 2018-01-07 DIAGNOSIS — S301XXD Contusion of abdominal wall, subsequent encounter: Secondary | ICD-10-CM | POA: Diagnosis not present

## 2018-01-07 DIAGNOSIS — Z48812 Encounter for surgical aftercare following surgery on the circulatory system: Secondary | ICD-10-CM | POA: Diagnosis not present

## 2018-01-09 DIAGNOSIS — Z48812 Encounter for surgical aftercare following surgery on the circulatory system: Secondary | ICD-10-CM | POA: Diagnosis not present

## 2018-01-09 DIAGNOSIS — I2699 Other pulmonary embolism without acute cor pulmonale: Secondary | ICD-10-CM | POA: Diagnosis not present

## 2018-01-09 DIAGNOSIS — I130 Hypertensive heart and chronic kidney disease with heart failure and stage 1 through stage 4 chronic kidney disease, or unspecified chronic kidney disease: Secondary | ICD-10-CM | POA: Diagnosis not present

## 2018-01-09 DIAGNOSIS — S301XXD Contusion of abdominal wall, subsequent encounter: Secondary | ICD-10-CM | POA: Diagnosis not present

## 2018-01-09 DIAGNOSIS — I82403 Acute embolism and thrombosis of unspecified deep veins of lower extremity, bilateral: Secondary | ICD-10-CM | POA: Diagnosis not present

## 2018-01-13 DIAGNOSIS — I130 Hypertensive heart and chronic kidney disease with heart failure and stage 1 through stage 4 chronic kidney disease, or unspecified chronic kidney disease: Secondary | ICD-10-CM | POA: Diagnosis not present

## 2018-01-13 DIAGNOSIS — Z48812 Encounter for surgical aftercare following surgery on the circulatory system: Secondary | ICD-10-CM | POA: Diagnosis not present

## 2018-01-13 DIAGNOSIS — I82403 Acute embolism and thrombosis of unspecified deep veins of lower extremity, bilateral: Secondary | ICD-10-CM | POA: Diagnosis not present

## 2018-01-13 DIAGNOSIS — S301XXD Contusion of abdominal wall, subsequent encounter: Secondary | ICD-10-CM | POA: Diagnosis not present

## 2018-01-13 DIAGNOSIS — I2699 Other pulmonary embolism without acute cor pulmonale: Secondary | ICD-10-CM | POA: Diagnosis not present

## 2018-01-15 ENCOUNTER — Ambulatory Visit: Payer: Medicare Other | Admitting: Family Medicine

## 2018-01-15 DIAGNOSIS — I2699 Other pulmonary embolism without acute cor pulmonale: Secondary | ICD-10-CM | POA: Diagnosis not present

## 2018-01-15 DIAGNOSIS — I82403 Acute embolism and thrombosis of unspecified deep veins of lower extremity, bilateral: Secondary | ICD-10-CM | POA: Diagnosis not present

## 2018-01-15 DIAGNOSIS — Z48812 Encounter for surgical aftercare following surgery on the circulatory system: Secondary | ICD-10-CM | POA: Diagnosis not present

## 2018-01-15 DIAGNOSIS — I130 Hypertensive heart and chronic kidney disease with heart failure and stage 1 through stage 4 chronic kidney disease, or unspecified chronic kidney disease: Secondary | ICD-10-CM | POA: Diagnosis not present

## 2018-01-15 DIAGNOSIS — S301XXD Contusion of abdominal wall, subsequent encounter: Secondary | ICD-10-CM | POA: Diagnosis not present

## 2018-01-18 DIAGNOSIS — Z48812 Encounter for surgical aftercare following surgery on the circulatory system: Secondary | ICD-10-CM | POA: Diagnosis not present

## 2018-01-18 DIAGNOSIS — I82403 Acute embolism and thrombosis of unspecified deep veins of lower extremity, bilateral: Secondary | ICD-10-CM | POA: Diagnosis not present

## 2018-01-18 DIAGNOSIS — I2699 Other pulmonary embolism without acute cor pulmonale: Secondary | ICD-10-CM | POA: Diagnosis not present

## 2018-01-18 DIAGNOSIS — I130 Hypertensive heart and chronic kidney disease with heart failure and stage 1 through stage 4 chronic kidney disease, or unspecified chronic kidney disease: Secondary | ICD-10-CM | POA: Diagnosis not present

## 2018-01-18 DIAGNOSIS — S301XXD Contusion of abdominal wall, subsequent encounter: Secondary | ICD-10-CM | POA: Diagnosis not present

## 2018-01-19 ENCOUNTER — Ambulatory Visit (INDEPENDENT_AMBULATORY_CARE_PROVIDER_SITE_OTHER): Payer: Medicare Other | Admitting: Family Medicine

## 2018-01-19 ENCOUNTER — Encounter: Payer: Self-pay | Admitting: Family Medicine

## 2018-01-19 VITALS — BP 126/84 | HR 95 | Temp 98.6°F | Wt 219.2 lb

## 2018-01-19 DIAGNOSIS — R7989 Other specified abnormal findings of blood chemistry: Secondary | ICD-10-CM

## 2018-01-19 DIAGNOSIS — D649 Anemia, unspecified: Secondary | ICD-10-CM

## 2018-01-19 DIAGNOSIS — I2699 Other pulmonary embolism without acute cor pulmonale: Secondary | ICD-10-CM | POA: Diagnosis not present

## 2018-01-19 DIAGNOSIS — R269 Unspecified abnormalities of gait and mobility: Secondary | ICD-10-CM | POA: Diagnosis not present

## 2018-01-19 LAB — CBC WITH DIFFERENTIAL/PLATELET
BASOS PCT: 0.3 % (ref 0.0–3.0)
Basophils Absolute: 0 10*3/uL (ref 0.0–0.1)
EOS ABS: 0.1 10*3/uL (ref 0.0–0.7)
Eosinophils Relative: 1 % (ref 0.0–5.0)
HEMATOCRIT: 38.4 % (ref 36.0–46.0)
Hemoglobin: 12.5 g/dL (ref 12.0–15.0)
LYMPHS ABS: 1.8 10*3/uL (ref 0.7–4.0)
LYMPHS PCT: 28.5 % (ref 12.0–46.0)
MCHC: 32.5 g/dL (ref 30.0–36.0)
MCV: 91.4 fl (ref 78.0–100.0)
Monocytes Absolute: 0.6 10*3/uL (ref 0.1–1.0)
Monocytes Relative: 10.1 % (ref 3.0–12.0)
NEUTROS ABS: 3.8 10*3/uL (ref 1.4–7.7)
NEUTROS PCT: 60.1 % (ref 43.0–77.0)
PLATELETS: 142 10*3/uL — AB (ref 150.0–400.0)
RBC: 4.2 Mil/uL (ref 3.87–5.11)
RDW: 16.4 % — AB (ref 11.5–15.5)
WBC: 6.3 10*3/uL (ref 4.0–10.5)

## 2018-01-19 LAB — BASIC METABOLIC PANEL
BUN: 26 mg/dL — AB (ref 6–23)
CO2: 31 mEq/L (ref 19–32)
Calcium: 9.6 mg/dL (ref 8.4–10.5)
Chloride: 105 mEq/L (ref 96–112)
Creatinine, Ser: 1.57 mg/dL — ABNORMAL HIGH (ref 0.40–1.20)
GFR: 34.33 mL/min — AB (ref >60.00)
Glucose, Bld: 92 mg/dL (ref 70–99)
POTASSIUM: 4.8 meq/L (ref 3.5–5.1)
Sodium: 144 mEq/L (ref 135–145)

## 2018-01-19 MED ORDER — NYSTATIN 100000 UNIT/GM EX CREA: 1.0000 "application " | TOPICAL_CREAM | Freq: Two times a day (BID) | CUTANEOUS | 5 refills | Status: DC | PRN

## 2018-01-19 NOTE — Progress Notes (Signed)
"  Everything is better." Her strength is better with PT.  Still with walker use, but using/needing it less. No leg pain.  She is doing her home exercises.    Her BP was recently low with PT but she was fasting prior to the event.  Her sx resolved with rest.  No sx in the meantime.   Bruising is resolving.  Her breathing is better than prev.    Her tremor is episodically getting more noted.  Waxes and wanes.  May be related to relative fasting, d/w pt.    She wanted to have nystatin to use if needed for rash under her breasts.  D/w pt.    Due for f/u labs, recheck Cr and Hgb.  Was anemic on last set of labs but hemoglobin was improved.  Iron level was improved on last set of labs.  She did have a small increase in her creatinine on last set of labs.  Discussed with patient.  PMH and SH reviewed  ROS: Per HPI unless specifically indicated in ROS section   Meds, vitals, and allergies reviewed.   GEN: nad, alert and oriented HEENT: mucous membranes moist NECK: supple w/o LA CV: rrr.  PULM: ctab, no inc wob ABD: soft, +bs EXT: no edema Chronic hand arthritic changes noted. B hand tremor episodically noted but no asterixis.  Using walker at baseline but her gait is improved with quicker pacing compared to previous.

## 2018-01-19 NOTE — Patient Instructions (Addendum)
Make sure to get an snack and something to drink prior to exercise/PT.   Go to the lab on the way out.  We'll contact you with your lab report. If you get lightheaded more often then skip the metoprolol and let me know.  Take care.  Glad to see you.

## 2018-01-20 DIAGNOSIS — R7989 Other specified abnormal findings of blood chemistry: Secondary | ICD-10-CM | POA: Insufficient documentation

## 2018-01-20 NOTE — Assessment & Plan Note (Signed)
Previously mild elevation.  Recheck creatinine today.  See notes on labs.

## 2018-01-20 NOTE — Assessment & Plan Note (Signed)
She is overall improving with strengthening exercises through physical therapy.  Continue as needed use of her walker.

## 2018-01-20 NOTE — Assessment & Plan Note (Signed)
Was previously improving.  No new known bleeding.  Recheck CBC today.  See notes on labs.  Discussed with patient. >25 minutes spent in face to face time with patient, >50% spent in counselling or coordination of care.

## 2018-01-20 NOTE — Assessment & Plan Note (Signed)
She has a filter in place.  No anticoagulation at this point.  Still on baseline medications otherwise.  Her breathing is improved.  See notes on follow-up labs.

## 2018-01-21 DIAGNOSIS — Z48812 Encounter for surgical aftercare following surgery on the circulatory system: Secondary | ICD-10-CM | POA: Diagnosis not present

## 2018-01-21 DIAGNOSIS — I2699 Other pulmonary embolism without acute cor pulmonale: Secondary | ICD-10-CM | POA: Diagnosis not present

## 2018-01-21 DIAGNOSIS — S301XXD Contusion of abdominal wall, subsequent encounter: Secondary | ICD-10-CM | POA: Diagnosis not present

## 2018-01-21 DIAGNOSIS — I82403 Acute embolism and thrombosis of unspecified deep veins of lower extremity, bilateral: Secondary | ICD-10-CM | POA: Diagnosis not present

## 2018-01-21 DIAGNOSIS — I130 Hypertensive heart and chronic kidney disease with heart failure and stage 1 through stage 4 chronic kidney disease, or unspecified chronic kidney disease: Secondary | ICD-10-CM | POA: Diagnosis not present

## 2018-01-22 ENCOUNTER — Other Ambulatory Visit: Payer: Self-pay | Admitting: Family Medicine

## 2018-01-22 DIAGNOSIS — R7989 Other specified abnormal findings of blood chemistry: Secondary | ICD-10-CM

## 2018-01-23 DIAGNOSIS — S301XXD Contusion of abdominal wall, subsequent encounter: Secondary | ICD-10-CM | POA: Diagnosis not present

## 2018-01-23 DIAGNOSIS — Z48812 Encounter for surgical aftercare following surgery on the circulatory system: Secondary | ICD-10-CM | POA: Diagnosis not present

## 2018-01-23 DIAGNOSIS — I2699 Other pulmonary embolism without acute cor pulmonale: Secondary | ICD-10-CM | POA: Diagnosis not present

## 2018-01-23 DIAGNOSIS — I82403 Acute embolism and thrombosis of unspecified deep veins of lower extremity, bilateral: Secondary | ICD-10-CM | POA: Diagnosis not present

## 2018-01-23 DIAGNOSIS — I130 Hypertensive heart and chronic kidney disease with heart failure and stage 1 through stage 4 chronic kidney disease, or unspecified chronic kidney disease: Secondary | ICD-10-CM | POA: Diagnosis not present

## 2018-01-27 ENCOUNTER — Other Ambulatory Visit: Payer: Medicare Other

## 2018-01-28 ENCOUNTER — Telehealth: Payer: Self-pay | Admitting: Family Medicine

## 2018-01-28 DIAGNOSIS — I2699 Other pulmonary embolism without acute cor pulmonale: Secondary | ICD-10-CM | POA: Diagnosis not present

## 2018-01-28 DIAGNOSIS — I130 Hypertensive heart and chronic kidney disease with heart failure and stage 1 through stage 4 chronic kidney disease, or unspecified chronic kidney disease: Secondary | ICD-10-CM | POA: Diagnosis not present

## 2018-01-28 DIAGNOSIS — I82403 Acute embolism and thrombosis of unspecified deep veins of lower extremity, bilateral: Secondary | ICD-10-CM | POA: Diagnosis not present

## 2018-01-28 DIAGNOSIS — S301XXD Contusion of abdominal wall, subsequent encounter: Secondary | ICD-10-CM | POA: Diagnosis not present

## 2018-01-28 DIAGNOSIS — Z48812 Encounter for surgical aftercare following surgery on the circulatory system: Secondary | ICD-10-CM | POA: Diagnosis not present

## 2018-01-28 NOTE — Telephone Encounter (Signed)
Please give the order.  Thanks.   

## 2018-01-28 NOTE — Telephone Encounter (Signed)
Left detailed message on voicemail.  

## 2018-01-28 NOTE — Telephone Encounter (Signed)
Copied from [place] [id number]. Topic: Quick Communication - See Telephone Encounter >> Jan 28, 2018 12:31 PM Hewitt Shorts wrote: CRM for notification. See Telephone encounter for: physical therapy is calling to request verbal order of continue for 1 a week for 2 weeks  Best number [phone] [facility]  01/28/18.

## 2018-01-29 ENCOUNTER — Other Ambulatory Visit (INDEPENDENT_AMBULATORY_CARE_PROVIDER_SITE_OTHER): Payer: Medicare Other

## 2018-01-29 DIAGNOSIS — R7989 Other specified abnormal findings of blood chemistry: Secondary | ICD-10-CM

## 2018-01-29 LAB — BASIC METABOLIC PANEL
BUN: 28 mg/dL — ABNORMAL HIGH (ref 6–23)
CO2: 29 meq/L (ref 19–32)
CREATININE: 1.52 mg/dL — AB (ref 0.40–1.20)
Calcium: 9.1 mg/dL (ref 8.4–10.5)
Chloride: 107 mEq/L (ref 96–112)
GFR: 35.63 mL/min — ABNORMAL LOW (ref >60.00)
Glucose, Bld: 97 mg/dL (ref 70–99)
Potassium: 4.5 mEq/L (ref 3.5–5.1)
SODIUM: 144 meq/L (ref 135–145)

## 2018-01-29 NOTE — Addendum Note (Signed)
Addended by: Lurlean Nanny on: 01/29/2018 10:25 AM   Modules accepted: Orders

## 2018-02-01 ENCOUNTER — Other Ambulatory Visit: Payer: Self-pay | Admitting: Family Medicine

## 2018-02-01 DIAGNOSIS — R7989 Other specified abnormal findings of blood chemistry: Secondary | ICD-10-CM

## 2018-02-02 DIAGNOSIS — I130 Hypertensive heart and chronic kidney disease with heart failure and stage 1 through stage 4 chronic kidney disease, or unspecified chronic kidney disease: Secondary | ICD-10-CM | POA: Diagnosis not present

## 2018-02-02 DIAGNOSIS — I2699 Other pulmonary embolism without acute cor pulmonale: Secondary | ICD-10-CM | POA: Diagnosis not present

## 2018-02-02 DIAGNOSIS — I82403 Acute embolism and thrombosis of unspecified deep veins of lower extremity, bilateral: Secondary | ICD-10-CM | POA: Diagnosis not present

## 2018-02-02 DIAGNOSIS — Z48812 Encounter for surgical aftercare following surgery on the circulatory system: Secondary | ICD-10-CM | POA: Diagnosis not present

## 2018-02-02 DIAGNOSIS — S301XXD Contusion of abdominal wall, subsequent encounter: Secondary | ICD-10-CM | POA: Diagnosis not present

## 2018-02-03 ENCOUNTER — Telehealth: Payer: Self-pay | Admitting: Family Medicine

## 2018-02-03 NOTE — Telephone Encounter (Signed)
Patient notified as instructed by telephone and verbalized understanding. Patient stated that the physical therapist checked her BP yesterday and it was 90/62. Patient stated that she will be in touch with Dr. Damita Dunnings in a few days as instructed.

## 2018-02-03 NOTE — Telephone Encounter (Signed)
Noted.  Please have her update me about her BP, pulse, and how she feels in general in a few days.  Thanks.

## 2018-02-03 NOTE — Telephone Encounter (Signed)
Noted. Thanks. I'll await input.

## 2018-02-03 NOTE — Telephone Encounter (Signed)
Copied from [place]. Topic: Quick Communication - Rx Refill/Question >> Feb 03, 2018 12:39 PM Scherrie Gerlach wrote: Medication: metoprolol succinate (TOPROL-XL) 50 MG 24 hr tablet   pt was to let Dr Damita Dunnings know if/when she went off this med, and she has stopped taking as of today

## 2018-03-05 ENCOUNTER — Ambulatory Visit: Payer: Medicare Other | Admitting: Family Medicine

## 2018-05-20 ENCOUNTER — Other Ambulatory Visit: Payer: Self-pay | Admitting: Family Medicine

## 2018-07-21 DIAGNOSIS — M1990 Unspecified osteoarthritis, unspecified site: Secondary | ICD-10-CM | POA: Diagnosis not present

## 2018-07-21 DIAGNOSIS — Z79899 Other long term (current) drug therapy: Secondary | ICD-10-CM | POA: Diagnosis not present

## 2018-07-21 DIAGNOSIS — M79643 Pain in unspecified hand: Secondary | ICD-10-CM | POA: Diagnosis not present

## 2018-07-21 DIAGNOSIS — M609 Myositis, unspecified: Secondary | ICD-10-CM | POA: Diagnosis not present

## 2018-07-21 DIAGNOSIS — M329 Systemic lupus erythematosus, unspecified: Secondary | ICD-10-CM | POA: Diagnosis not present

## 2018-08-05 ENCOUNTER — Other Ambulatory Visit: Payer: Self-pay | Admitting: Family Medicine

## 2018-08-22 ENCOUNTER — Other Ambulatory Visit: Payer: Self-pay | Admitting: Family Medicine

## 2018-08-22 DIAGNOSIS — E039 Hypothyroidism, unspecified: Secondary | ICD-10-CM

## 2018-08-22 DIAGNOSIS — I739 Peripheral vascular disease, unspecified: Secondary | ICD-10-CM

## 2018-08-22 DIAGNOSIS — D649 Anemia, unspecified: Secondary | ICD-10-CM

## 2018-08-22 DIAGNOSIS — M329 Systemic lupus erythematosus, unspecified: Secondary | ICD-10-CM

## 2018-08-23 ENCOUNTER — Ambulatory Visit: Payer: Medicare Other

## 2018-08-31 ENCOUNTER — Encounter: Payer: Medicare Other | Admitting: Family Medicine

## 2018-10-02 ENCOUNTER — Other Ambulatory Visit: Payer: Self-pay | Admitting: Family Medicine

## 2018-10-31 ENCOUNTER — Other Ambulatory Visit: Payer: Self-pay | Admitting: Family Medicine

## 2018-11-16 DIAGNOSIS — Z79899 Other long term (current) drug therapy: Secondary | ICD-10-CM | POA: Diagnosis not present

## 2018-11-16 DIAGNOSIS — M6281 Muscle weakness (generalized): Secondary | ICD-10-CM | POA: Diagnosis not present

## 2018-11-16 DIAGNOSIS — M329 Systemic lupus erythematosus, unspecified: Secondary | ICD-10-CM | POA: Diagnosis not present

## 2018-11-16 DIAGNOSIS — M1990 Unspecified osteoarthritis, unspecified site: Secondary | ICD-10-CM | POA: Diagnosis not present

## 2018-11-16 DIAGNOSIS — M545 Low back pain: Secondary | ICD-10-CM | POA: Diagnosis not present

## 2018-12-30 IMAGING — CT CT ANGIO CHEST
2 of 6 series · 18 of 46 positions shown · IV contrast (ISOVUE)
Comparison: Chest x-ray from earlier in the same day.

CLINICAL DATA: Shortness of Breath

EXAM:
CT ANGIOGRAPHY CHEST WITH CONTRAST
TECHNIQUE: Multidetector CT imaging of the chest was performed using the
standard protocol during bolus administration of intravenous
contrast. Multiplanar CT image reconstructions and MIPs were
obtained to evaluate the vascular anatomy.
CONTRAST:  70mL [id number]

[Series 6: thins · axial · 0.72mm/px · z∈[-343,-46]mm · 15 of 327 slices shown]
[im 15/327  lung]
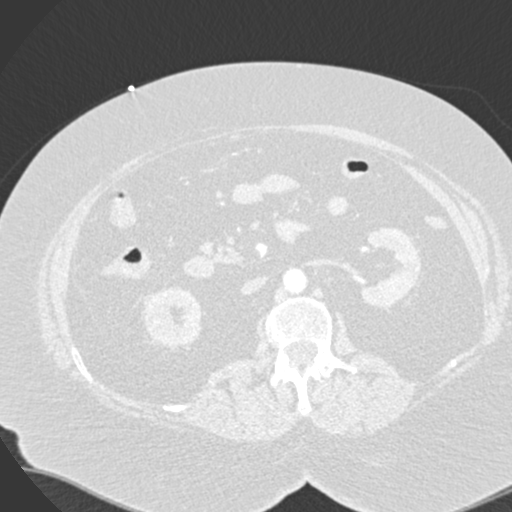
[im 43/327  soft-tissue]
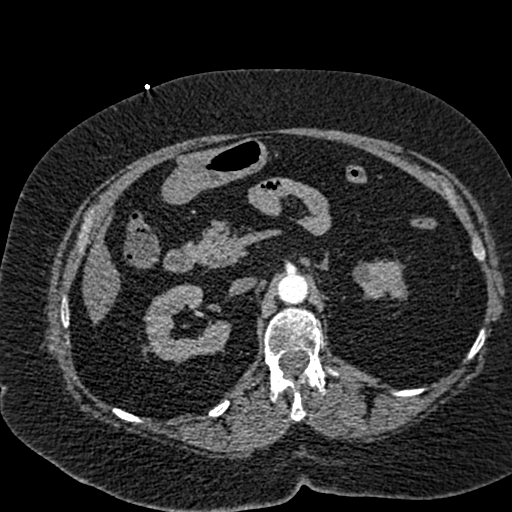
[im 57/327  lung]
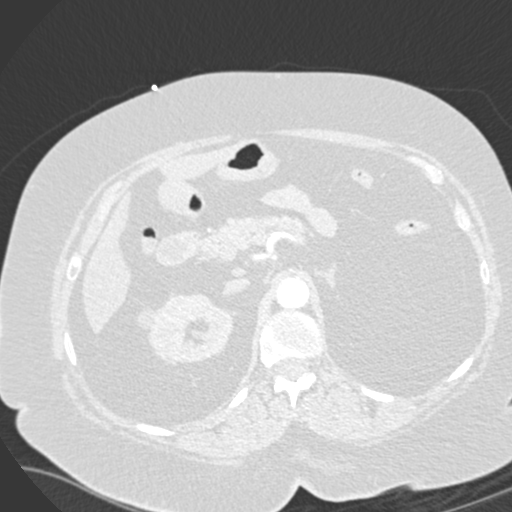
[im 86/327  soft-tissue]
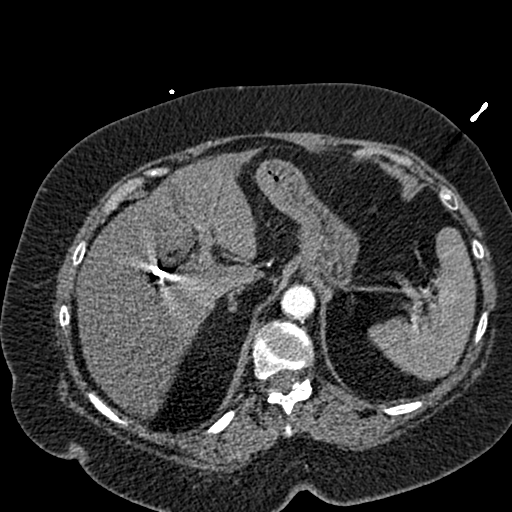
[im 100/327  lung]
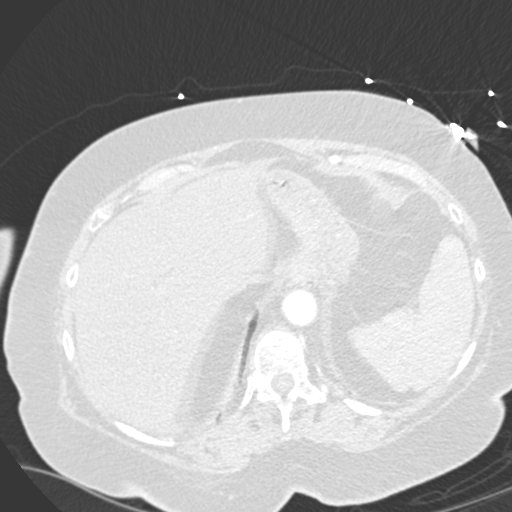
[im 128/327  soft-tissue]
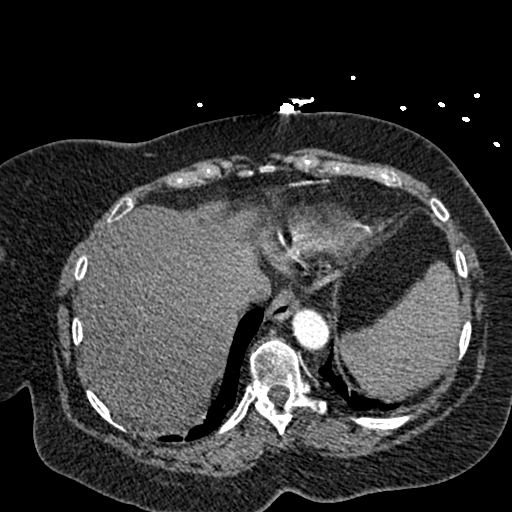
[im 142/327  lung]
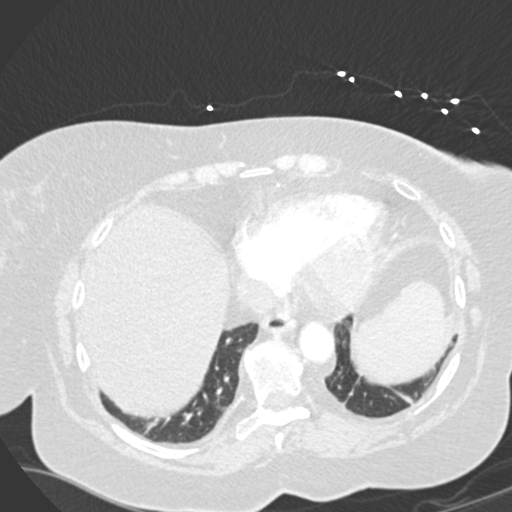
[im 171/327  soft-tissue]
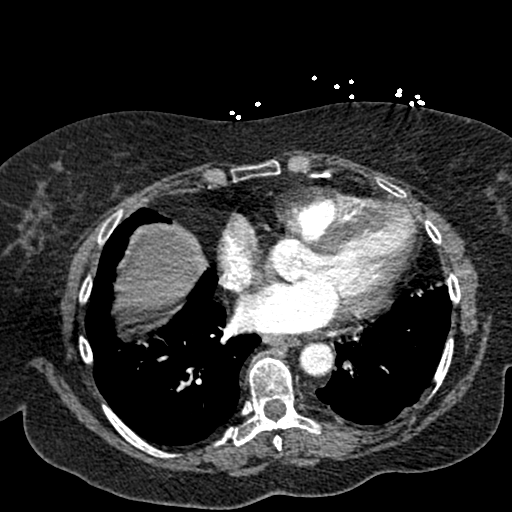
[im 185/327  lung]
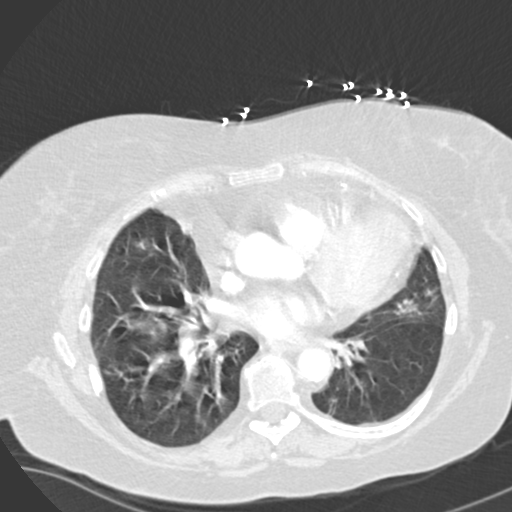
[im 199/327  soft-tissue]
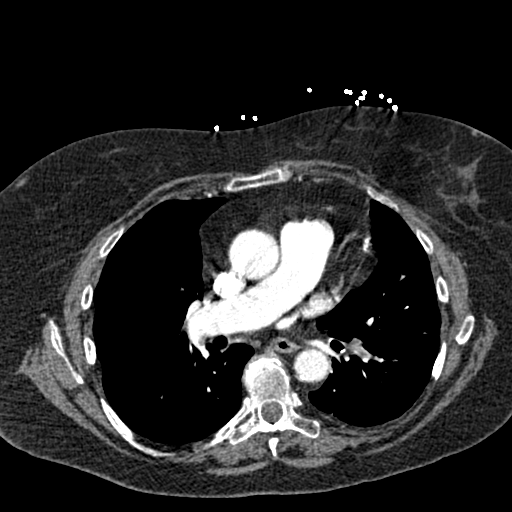
[im 227/327  lung]
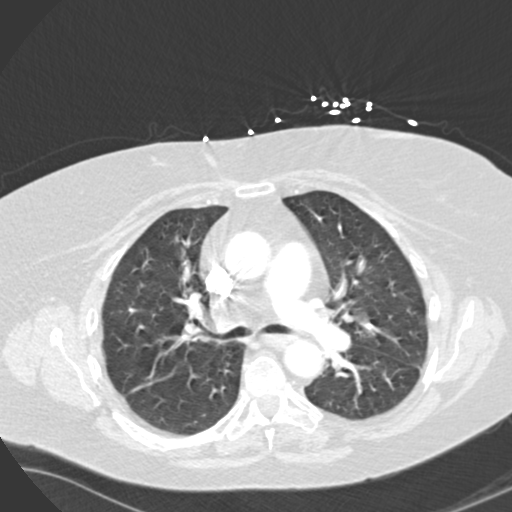
[im 241/327  soft-tissue]
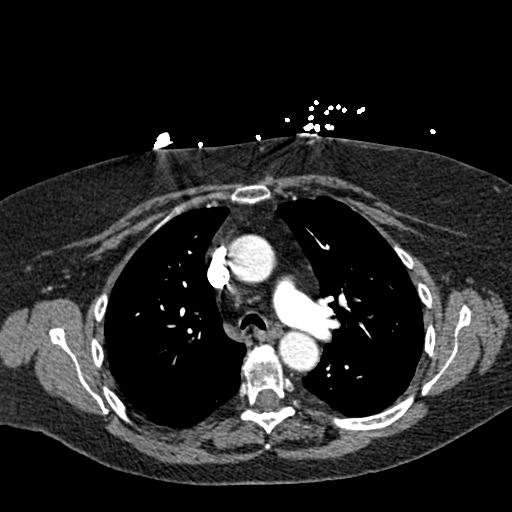
[im 270/327  lung]
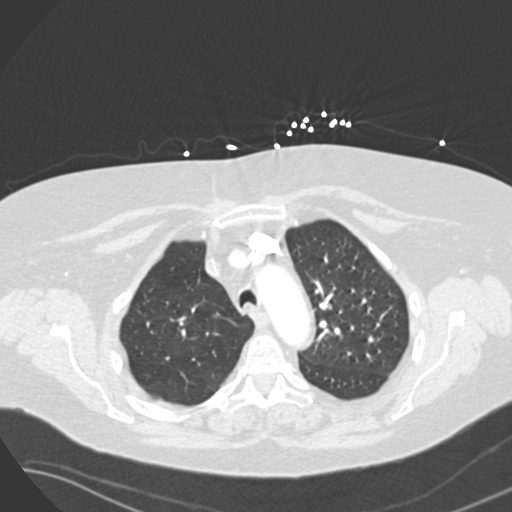
[im 284/327  soft-tissue]
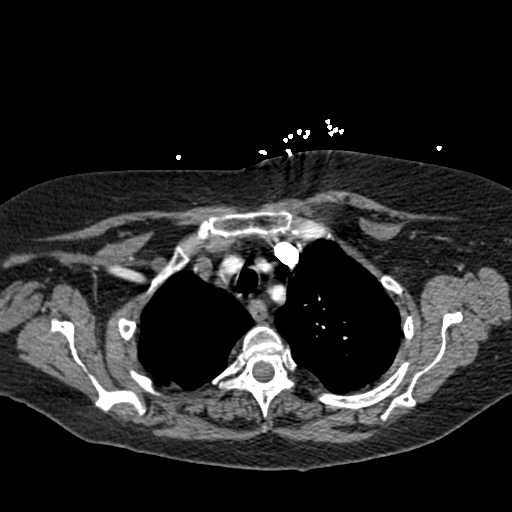
[im 312/327  lung]
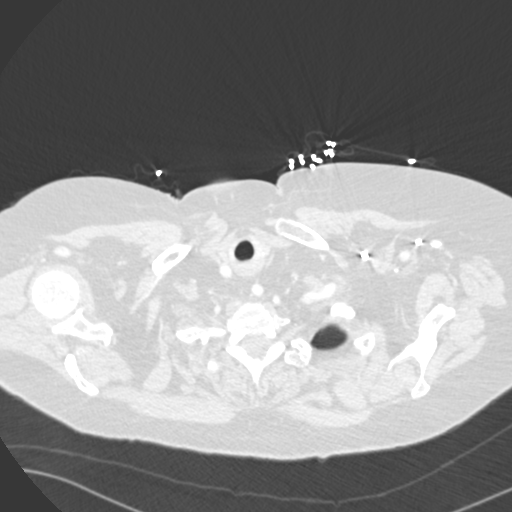

[Series 7: coronal mpr · coronal · 0.65mm/px · 3 of 139 slices shown]
[im 35/139  soft-tissue]
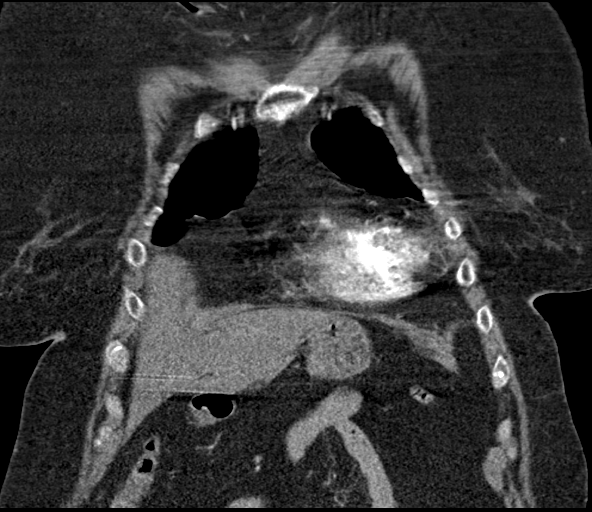
[im 70/139  soft-tissue]
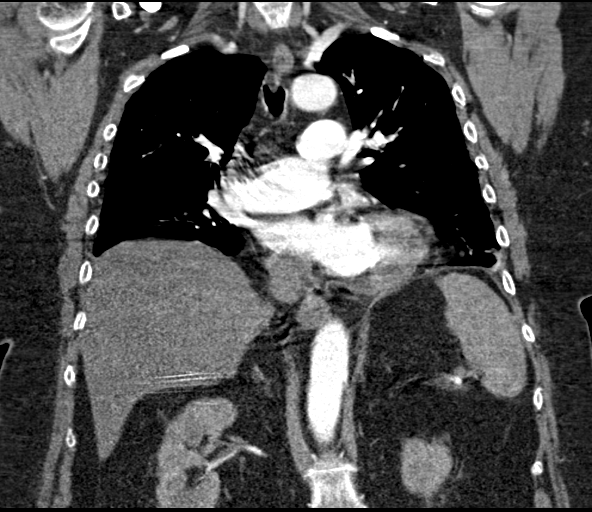
[im 104/139  soft-tissue]
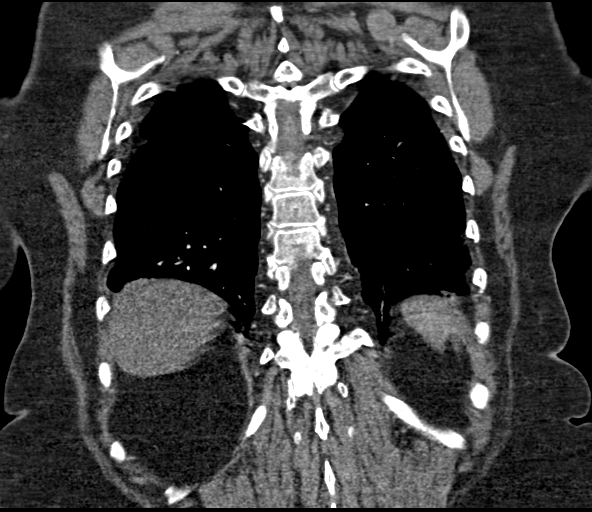

[18 of 46 positions shown; findings below may reference images not displayed]

FINDINGS: Cardiovascular: The thoracic aorta demonstrates atherosclerotic
calcifications without evidence of dissection. No significant and
this aneurysmal dilatation is seen. Pulmonary artery is well
visualized and demonstrates multiple filling defects primarily
within the left lower lobe consistent with pulmonary emboli. There
is elevation of the RV/LV ratio to 1.

Mediastinum/Nodes: No hilar or mediastinal adenopathy is noted. The
thoracic inlet is within normal limits. The esophagus is
unremarkable.

Lungs/Pleura: Lungs are well aerated bilaterally with evidence of
mild bibasilar atelectasis. No focal confluent infiltrate is seen.
No sizable effusion is noted. No pneumothorax is seen. No pulmonary
non is noted.

Upper Abdomen: Changes consistent with prior cholecystectomy. Mild
fatty infiltration of the liver is seen. Mild renal cystic changes
are noted.

Musculoskeletal: Mild degenerative changes of thoracic spine are
seen.

Review of the MIP images confirms the above findings.
IMPRESSION: Positive for acute PE with CT evidence of right heart strain (RV/LV
Ratio = 1) consistent with at least submassive (intermediate risk)
PE. The presence of right heart strain has been associated with an
increased risk of morbidity and mortality. Please activate Code PE
by paging [phone].

Critical Value/emergent results were called by telephone at the time
of interpretation on 12/12/2017 at [DATE] to Dr. Pernel, who
verbally acknowledged these results.

Aortic Atherosclerosis ([id number]-WP1.1).

## 2019-02-13 ENCOUNTER — Other Ambulatory Visit: Payer: Self-pay | Admitting: Family Medicine

## 2019-02-14 ENCOUNTER — Encounter: Payer: Self-pay | Admitting: *Deleted

## 2019-04-28 ENCOUNTER — Other Ambulatory Visit: Payer: Self-pay | Admitting: Family Medicine

## 2019-05-16 ENCOUNTER — Ambulatory Visit: Payer: Medicare Other | Admitting: Family Medicine

## 2019-05-18 ENCOUNTER — Other Ambulatory Visit: Payer: Self-pay | Admitting: Family Medicine

## 2019-06-10 DIAGNOSIS — M1612 Unilateral primary osteoarthritis, left hip: Secondary | ICD-10-CM | POA: Diagnosis not present

## 2019-06-10 DIAGNOSIS — M545 Low back pain: Secondary | ICD-10-CM | POA: Diagnosis not present

## 2019-06-10 DIAGNOSIS — M25552 Pain in left hip: Secondary | ICD-10-CM | POA: Diagnosis not present

## 2019-06-10 DIAGNOSIS — M1990 Unspecified osteoarthritis, unspecified site: Secondary | ICD-10-CM | POA: Diagnosis not present

## 2019-06-10 DIAGNOSIS — M609 Myositis, unspecified: Secondary | ICD-10-CM | POA: Diagnosis not present

## 2019-06-10 DIAGNOSIS — M8589 Other specified disorders of bone density and structure, multiple sites: Secondary | ICD-10-CM | POA: Diagnosis not present

## 2019-06-10 DIAGNOSIS — Z79899 Other long term (current) drug therapy: Secondary | ICD-10-CM | POA: Diagnosis not present

## 2019-06-10 DIAGNOSIS — M329 Systemic lupus erythematosus, unspecified: Secondary | ICD-10-CM | POA: Diagnosis not present

## 2019-06-14 ENCOUNTER — Other Ambulatory Visit: Payer: Self-pay | Admitting: Family Medicine

## 2019-07-01 IMAGING — US US RENAL
1 series · 14 of 25 positions shown · non-contrast
Comparison: Included portion from chest CT 12/12/2017

CLINICAL DATA: Acute kidney injury.

EXAM:
RENAL / URINARY TRACT ULTRASOUND COMPLETE

[Series 1: us renal · 0.30mm/px · 14 of 29 slices shown]
[im 1/29]
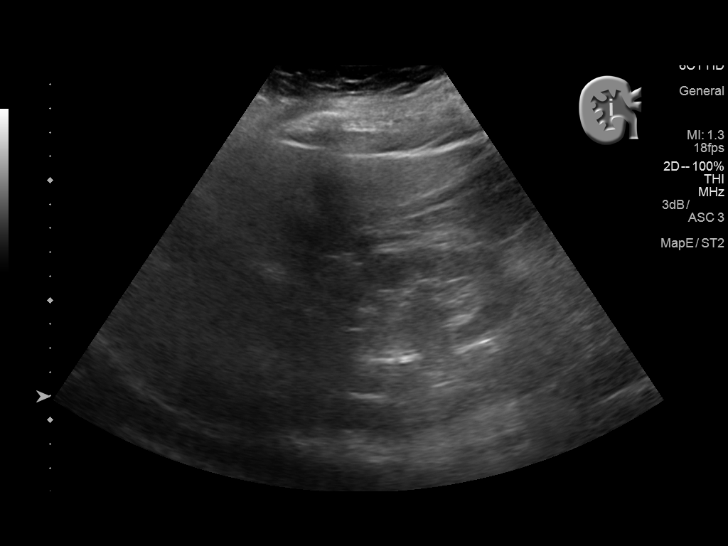
[im 3/29]
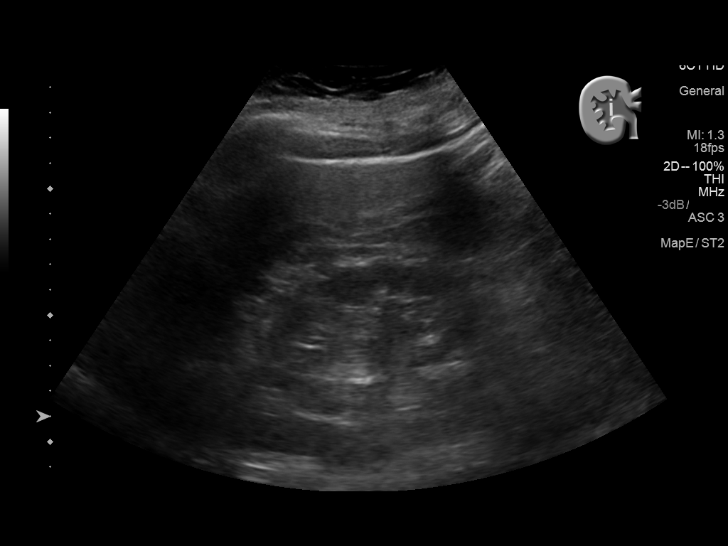
[im 5/29]
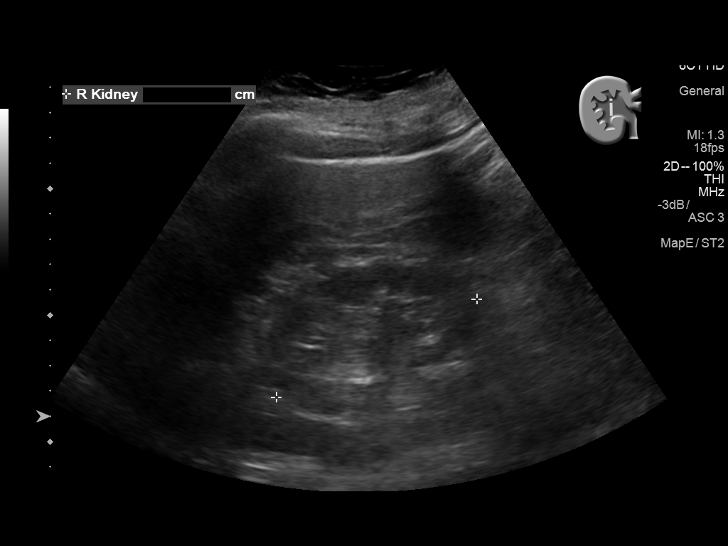
[im 8/29]
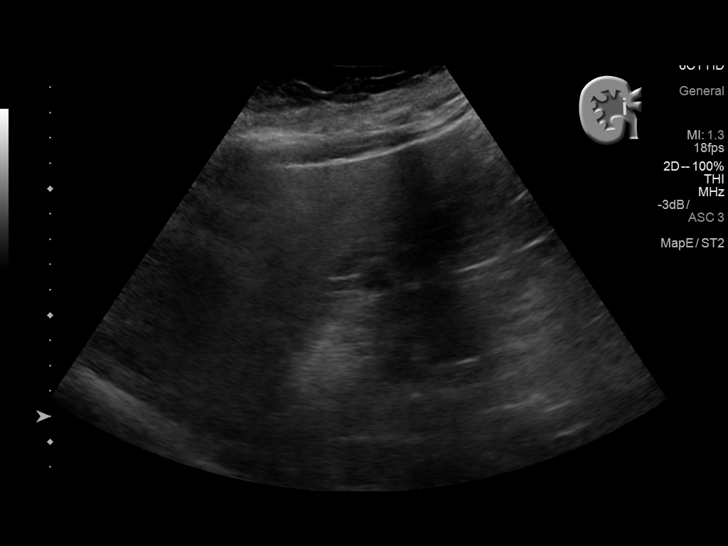
[im 10/29]
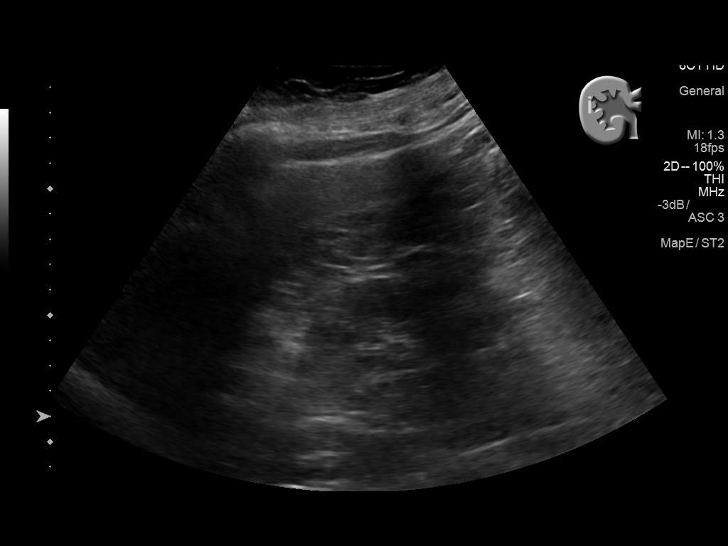
[im 11/29]
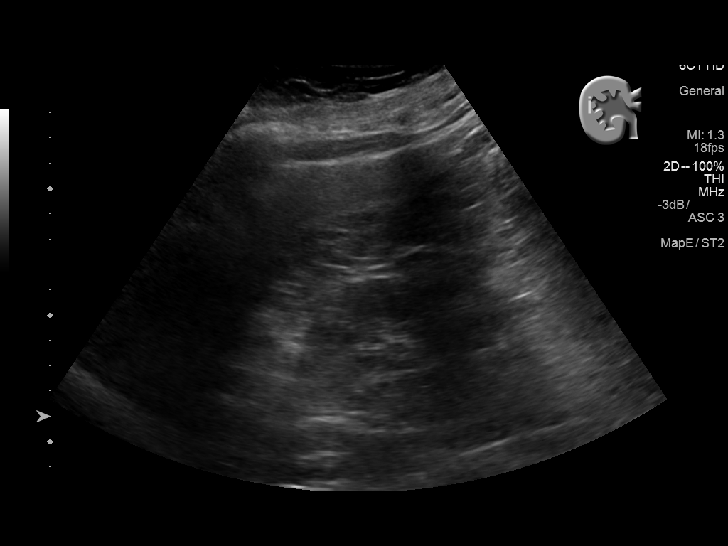
[im 13/29]
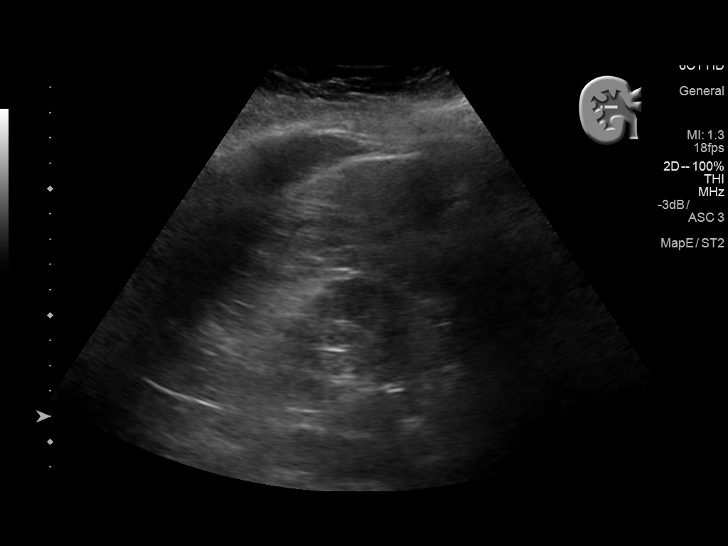
[im 16/29]
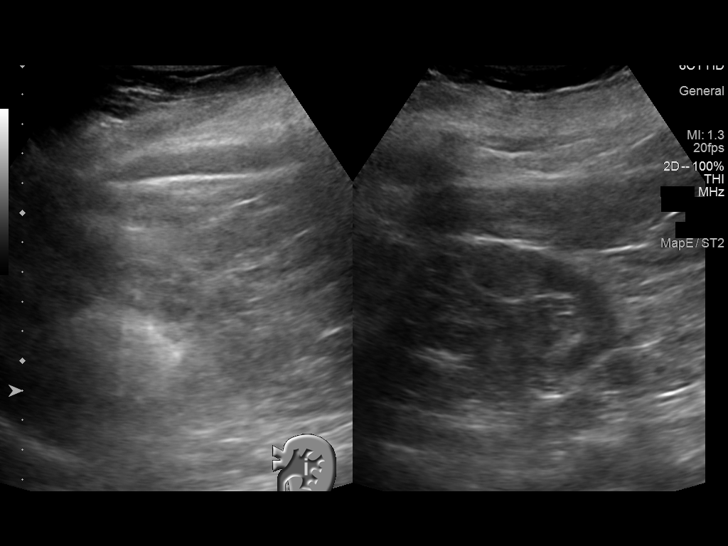
[im 18/29]
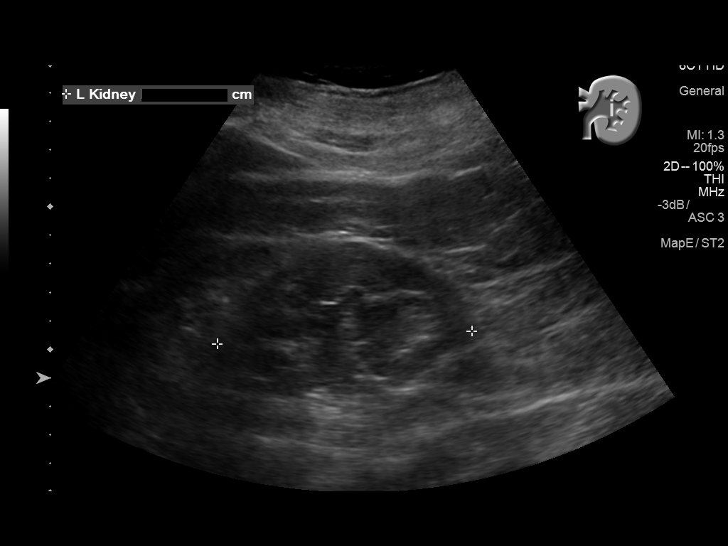
[im 19/29]
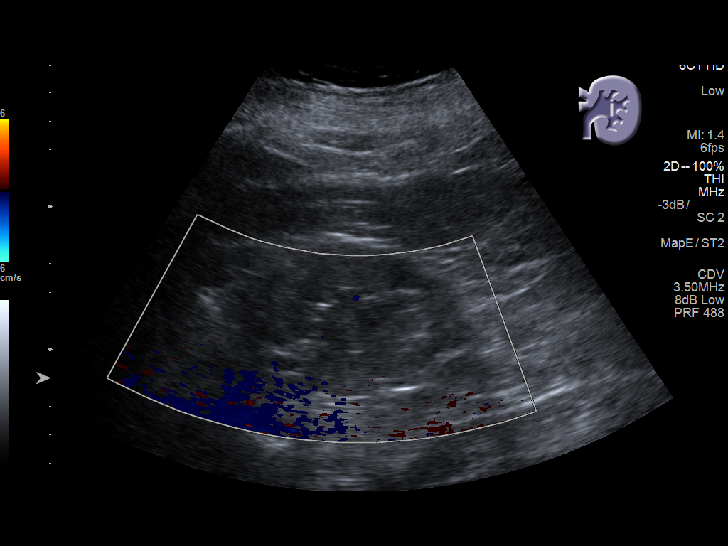
[im 22/29]
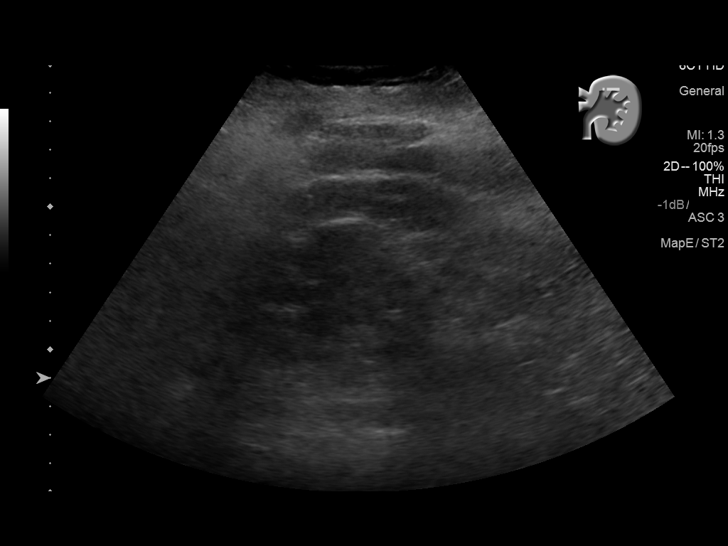
[im 24/29]
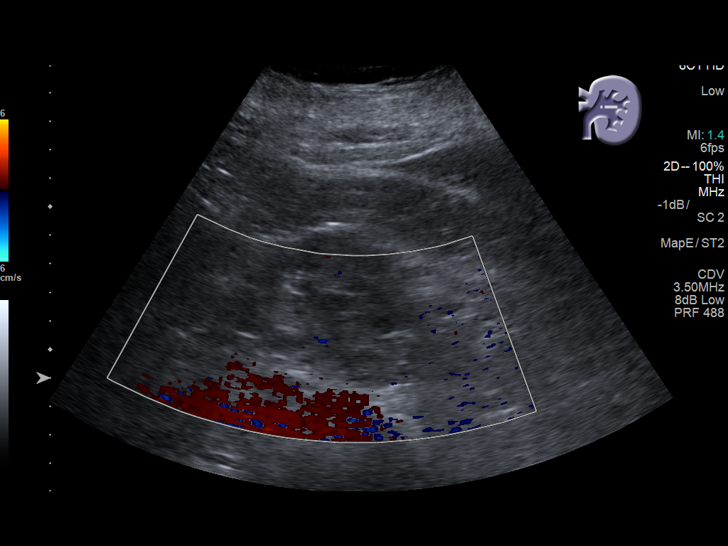
[im 26/29]
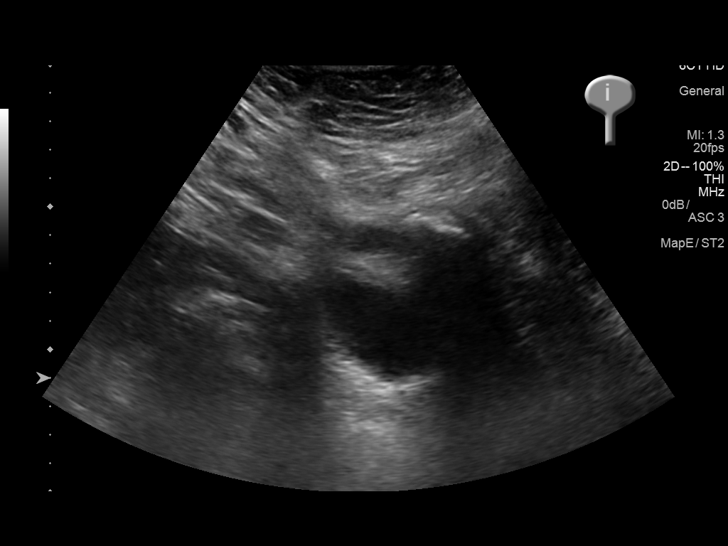
[im 29/29]
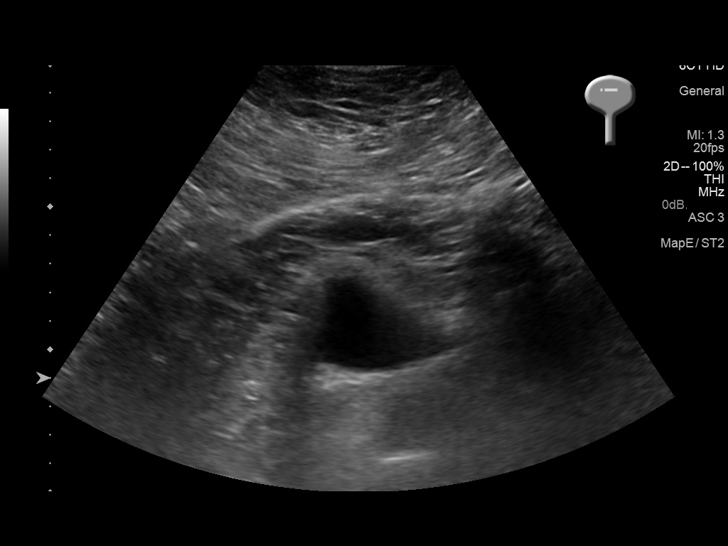

[14 of 25 positions shown; findings below may reference images not displayed]

FINDINGS: Right Kidney:

Length: 8.8 cm. Diffuse thinning of renal parenchyma. Echogenicity
within normal limits. No mass or hydronephrosis visualized. Cyst in
the upper right kidney on prior CT is not visualize sonographically.

Left Kidney:

Length: 8.9 cm. Diffuse thinning of renal parenchyma. Echogenicity
within normal limits. No mass or hydronephrosis visualized.

Bladder:

Only minimally distended. Appears normal for degree of bladder
distention.
IMPRESSION: Renal atrophy.  No hydronephrosis.

## 2019-08-05 ENCOUNTER — Other Ambulatory Visit: Payer: Self-pay | Admitting: Family Medicine

## 2019-08-28 ENCOUNTER — Other Ambulatory Visit: Payer: Self-pay | Admitting: Family Medicine

## 2019-08-28 DIAGNOSIS — E785 Hyperlipidemia, unspecified: Secondary | ICD-10-CM

## 2019-09-08 ENCOUNTER — Ambulatory Visit: Payer: Medicare Other

## 2019-09-08 ENCOUNTER — Other Ambulatory Visit (INDEPENDENT_AMBULATORY_CARE_PROVIDER_SITE_OTHER): Payer: Medicare Other

## 2019-09-08 DIAGNOSIS — I739 Peripheral vascular disease, unspecified: Secondary | ICD-10-CM | POA: Diagnosis not present

## 2019-09-08 DIAGNOSIS — M329 Systemic lupus erythematosus, unspecified: Secondary | ICD-10-CM | POA: Diagnosis not present

## 2019-09-08 DIAGNOSIS — D649 Anemia, unspecified: Secondary | ICD-10-CM | POA: Diagnosis not present

## 2019-09-08 DIAGNOSIS — E039 Hypothyroidism, unspecified: Secondary | ICD-10-CM | POA: Diagnosis not present

## 2019-09-08 DIAGNOSIS — E785 Hyperlipidemia, unspecified: Secondary | ICD-10-CM

## 2019-09-08 LAB — CBC WITH DIFFERENTIAL/PLATELET
Basophils Absolute: 0 10*3/uL (ref 0.0–0.1)
Basophils Relative: 0.5 % (ref 0.0–3.0)
Eosinophils Absolute: 0.1 10*3/uL (ref 0.0–0.7)
Eosinophils Relative: 1.9 % (ref 0.0–5.0)
HCT: 42.2 % (ref 36.0–46.0)
Hemoglobin: 13.8 g/dL (ref 12.0–15.0)
Lymphocytes Relative: 43.7 % (ref 12.0–46.0)
Lymphs Abs: 1.8 10*3/uL (ref 0.7–4.0)
MCHC: 32.7 g/dL (ref 30.0–36.0)
MCV: 90.2 fl (ref 78.0–100.0)
Monocytes Absolute: 0.4 10*3/uL (ref 0.1–1.0)
Monocytes Relative: 8.3 % (ref 3.0–12.0)
Neutro Abs: 1.9 10*3/uL (ref 1.4–7.7)
Neutrophils Relative %: 45.6 % (ref 43.0–77.0)
Platelets: 142 10*3/uL — ABNORMAL LOW (ref 150.0–400.0)
RBC: 4.68 Mil/uL (ref 3.87–5.11)
RDW: 14.4 % (ref 11.5–15.5)
WBC: 4.2 10*3/uL (ref 4.0–10.5)

## 2019-09-08 LAB — COMPREHENSIVE METABOLIC PANEL
ALT: 15 U/L (ref 0–35)
AST: 26 U/L (ref 0–37)
Albumin: 4 g/dL (ref 3.5–5.2)
Alkaline Phosphatase: 104 U/L (ref 39–117)
BUN: 22 mg/dL (ref 6–23)
CO2: 30 mEq/L (ref 19–32)
Calcium: 9.5 mg/dL (ref 8.4–10.5)
Chloride: 106 mEq/L (ref 96–112)
Creatinine, Ser: 1.33 mg/dL — ABNORMAL HIGH (ref 0.40–1.20)
GFR: 38.94 mL/min — ABNORMAL LOW (ref >60.00)
Glucose, Bld: 96 mg/dL (ref 70–99)
Potassium: 4.7 mEq/L (ref 3.5–5.1)
Sodium: 143 mEq/L (ref 135–145)
Total Bilirubin: 0.6 mg/dL (ref 0.2–1.2)
Total Protein: 6.5 g/dL (ref 6.0–8.3)

## 2019-09-08 LAB — LIPID PANEL
Cholesterol: 179 mg/dL (ref 0–200)
HDL: 42.3 mg/dL (ref >39.00)
LDL Cholesterol: 108 mg/dL — ABNORMAL HIGH (ref 0–99)
NonHDL: 136.93
Total CHOL/HDL Ratio: 4
Triglycerides: 144 mg/dL (ref 0.0–149.0)
VLDL: 28.8 mg/dL (ref 0.0–40.0)

## 2019-09-08 LAB — VITAMIN D 25 HYDROXY (VIT D DEFICIENCY, FRACTURES): VITD: 37.39 ng/mL (ref 30.00–100.00)

## 2019-09-08 LAB — IBC PANEL
Iron: 87 ug/dL (ref 42–145)
Saturation Ratios: 29 % (ref 20.0–50.0)
Transferrin: 214 mg/dL (ref 212.0–360.0)

## 2019-09-08 LAB — TSH: TSH: 8.48 u[IU]/mL — ABNORMAL HIGH (ref 0.35–4.50)

## 2019-09-09 DIAGNOSIS — M1990 Unspecified osteoarthritis, unspecified site: Secondary | ICD-10-CM | POA: Diagnosis not present

## 2019-09-09 DIAGNOSIS — M609 Myositis, unspecified: Secondary | ICD-10-CM | POA: Diagnosis not present

## 2019-09-09 DIAGNOSIS — Z23 Encounter for immunization: Secondary | ICD-10-CM | POA: Diagnosis not present

## 2019-09-09 DIAGNOSIS — M545 Low back pain: Secondary | ICD-10-CM | POA: Diagnosis not present

## 2019-09-09 DIAGNOSIS — M329 Systemic lupus erythematosus, unspecified: Secondary | ICD-10-CM | POA: Diagnosis not present

## 2019-09-09 DIAGNOSIS — Z79899 Other long term (current) drug therapy: Secondary | ICD-10-CM | POA: Diagnosis not present

## 2019-09-14 ENCOUNTER — Other Ambulatory Visit: Payer: Self-pay | Admitting: Family Medicine

## 2019-09-15 ENCOUNTER — Ambulatory Visit (INDEPENDENT_AMBULATORY_CARE_PROVIDER_SITE_OTHER): Payer: Medicare Other | Admitting: Family Medicine

## 2019-09-15 ENCOUNTER — Other Ambulatory Visit: Payer: Self-pay

## 2019-09-15 ENCOUNTER — Encounter: Payer: Self-pay | Admitting: Family Medicine

## 2019-09-15 VITALS — BP 130/80 | HR 75 | Temp 97.5°F | Ht 65.0 in | Wt 215.2 lb

## 2019-09-15 DIAGNOSIS — F419 Anxiety disorder, unspecified: Secondary | ICD-10-CM

## 2019-09-15 DIAGNOSIS — Z7189 Other specified counseling: Secondary | ICD-10-CM

## 2019-09-15 DIAGNOSIS — E039 Hypothyroidism, unspecified: Secondary | ICD-10-CM | POA: Diagnosis not present

## 2019-09-15 DIAGNOSIS — Z0000 Encounter for general adult medical examination without abnormal findings: Secondary | ICD-10-CM | POA: Diagnosis not present

## 2019-09-15 DIAGNOSIS — M329 Systemic lupus erythematosus, unspecified: Secondary | ICD-10-CM

## 2019-09-15 MED ORDER — TRAMADOL HCL 50 MG PO TABS: 50.0000 mg | ORAL_TABLET | Freq: Two times a day (BID) | ORAL | 1 refills | Status: AC | PRN

## 2019-09-15 MED ORDER — ESCITALOPRAM OXALATE 10 MG PO TABS: 10.0000 mg | ORAL_TABLET | Freq: Every day | ORAL | 3 refills | Status: DC

## 2019-09-15 MED ORDER — NYSTATIN 100000 UNIT/GM EX POWD: Freq: Two times a day (BID) | CUTANEOUS | 2 refills | Status: DC | PRN

## 2019-09-15 MED ORDER — LEVOTHYROXINE SODIUM 112 MCG PO TABS: ORAL_TABLET | ORAL | 3 refills | Status: DC

## 2019-09-15 NOTE — Progress Notes (Addendum)
I have personally reviewed the Medicare Annual Wellness questionnaire and have noted 1. The patient's medical and social history 2. Their use of alcohol, tobacco or illicit drugs 3. Their current medications and supplements 4. The patient's functional ability including ADL's, fall risks, home safety risks and hearing or visual             impairment. 5. Diet and physical activities 6. Evidence for depression or mood disorders  The patients weight, height, BMI have been recorded in the chart and visual acuity is per eye clinic.  I have made referrals, counseling and provided education to the patient based review of the above and I have provided the pt with a written personalized care plan for preventive services.  Provider list updated- see scanned forms.  Routine anticipatory guidance given to patient.  See health maintenance. The possibility exists that previously documented standard health maintenance information may have been brought forward from a previous encounter into this note.  If needed, that same information has been updated to reflect the current situation based on today's encounter.    Flu prev done.  Shingles d/w pt.  PNA updated  Tetanus 2011 D/w patient [id number] for colon cancer screening, including IFOB vs. colonoscopy. Risks and benefits of both were discussed and patient voiced understanding. Pt declines screening.  Breast cancer screening- declined, d/ wpt.  DXA prev done by rheum, she'll notify me if f/u needed here.  Advance directive- both daughters designated if patient were incapacitated.  Cognitive function addressed- see scanned forms- and if abnormal then additional documentation follows.   She is hard of hearing at baseline.  She can walk farther than prev, but still using a cane.  She wants to be as independent as possible.  Her breathing is better in the meantime.   Hypothyroidism.  D/w pt about inc dose due to high TSH, labs d/w pt.  No neck mass.   Compliant.   She needed a refill on nystatin, preferred powder for prn use for rash.   Mood d/w pt.  Better on SSRI, wanted to continue.  No adverse effect on medication.  Her mood is better on med.  She is following with rheumatology re: lupus.   She has been having a lot of joint pain.  This is a chronic issue for her.  She had tried tramadol a long time ago and she did not recall a lot of help with that medication.  We talked about retrial of medication to see if she could tolerate it and to see if it would help with the pain.   PMH and SH reviewed  Meds, vitals, and allergies reviewed.   ROS: Per HPI.  Unless specifically indicated otherwise in HPI, the patient denies:  General: fever. Eyes: acute vision changes ENT: sore throat Cardiovascular: chest pain Respiratory: SOB GI: vomiting GU: dysuria Musculoskeletal: acute back pain Derm: acute rash Neuro: acute motor dysfunction Psych: worsening mood Endocrine: polydipsia Heme: bleeding Allergy: hayfever  GEN: nad, alert and oriented HEENT: mucous membranes moist NECK: supple w/o LA CV: rrr. PULM: ctab, no inc wob ABD: soft, +bs EXT: no edema SKIN: no acute rash Chronic hand/IP changes noted.    Health Maintenance  Topic Date Due  . COLONOSCOPY  02/13/1995  . DEXA SCAN  02/13/2010  . MAMMOGRAM  04/29/2037 (Originally 03/31/2015)  . TETANUS/TDAP  03/12/2020  . INFLUENZA VACCINE  Completed  . Hepatitis C Screening  Completed  . PNA vac Low Risk Adult  Completed

## 2019-09-15 NOTE — Patient Instructions (Addendum)
Ask the rheumatology clinic if you can take the new shingles shot.   Increase thyroid medicine. Take 1 tab a day on 6 days, then 1/2 tab on Sundays.  Take care.  Glad to see you.

## 2019-09-19 NOTE — Assessment & Plan Note (Signed)
Flu prev done.  Shingles d/w pt.  PNA updated  Tetanus 2011 D/w patient [id number] for colon cancer screening, including IFOB vs. colonoscopy. Risks and benefits of both were discussed and patient voiced understanding. Pt declines screening.  Breast cancer screening- declined, d/ wpt.  DXA prev done by rheum, she'll notify me if f/u needed here.  Advance directive- both daughters designated if patient were incapacitated.  Cognitive function addressed- see scanned forms- and if abnormal then additional documentation follows.

## 2019-09-19 NOTE — Assessment & Plan Note (Signed)
D/w pt about inc dose due to high TSH, labs d/w pt.  No neck mass.  Compliant with medication o/w.   Recheck TSH in about 2 months.

## 2019-09-19 NOTE — Assessment & Plan Note (Signed)
Advance directive- both daughters designated if patient were incapacitated.

## 2019-09-19 NOTE — Assessment & Plan Note (Signed)
Mood d/w pt.  Better on SSRI, wanted to continue.  No adverse effect on medication.  Her mood is better on med.

## 2019-09-19 NOTE — Assessment & Plan Note (Signed)
She is following with rheumatology re: lupus.   She has been having a lot of joint pain.  This is a chronic issue for her.  She had tried tramadol a long time ago and she did not recall a lot of help with that medication.  We talked about retrial of medication to see if she could tolerate it and to see if it would help with the pain.  She will update me as needed.

## 2019-11-15 ENCOUNTER — Other Ambulatory Visit: Payer: Medicare Other

## 2019-11-18 ENCOUNTER — Other Ambulatory Visit: Payer: Medicare Other

## 2019-12-26 ENCOUNTER — Telehealth: Payer: Self-pay

## 2019-12-26 NOTE — Telephone Encounter (Signed)
Noted. Thanks. I'll await the [facility] notes.

## 2019-12-26 NOTE — Telephone Encounter (Signed)
Hannah Pitts (DPR not signed) left v/m that pt has prod cough with clear phlegm since 12/23/19 and cold like symptoms. I spoke with Hannah Pitts with permission given by pt to speak with Hannah Pitts. No fever,chills,weakness (no more than usual), H/A,rash,SOB upon exertion and is no worse than usual. No abd pain or diarrhea,no bruising or bleeding or vomiting.no travel and no known exposure to + covid. Pt has scratchy throat and feels tired. Pt has hx of bronchitis. Pt is not in any distress except pt coughing all the time. pt will go to  [facility] for eval and possible xray if needed. FYI to Dr Damita Dunnings.

## 2020-01-01 ENCOUNTER — Telehealth: Payer: Self-pay | Admitting: Family Medicine

## 2020-01-01 NOTE — Telephone Encounter (Signed)
Please check with patient about her cough.  Let me know how she is doing.  She is due for follow-up TSH when possible.  The order is already in the EMR.  How is she doing with her joint pain?  Did tramadol help at all?  Please let me know.  Thanks.

## 2020-01-02 NOTE — Telephone Encounter (Signed)
Left detailed message on voicemail to return call for lab appt and update on cough and joint pain.

## 2020-01-03 NOTE — Telephone Encounter (Signed)
I hoped tramadol would help.  If her joint pain and cough are some better, then I'll defer otherwise.   I'll await the f/u lab. Thanks.

## 2020-01-03 NOTE — Telephone Encounter (Signed)
Patient called to schedule lab appointment  She stated that the Tramadol did not help with pain. Patient said that her cough is breaking up and join pain is doing better

## 2020-01-09 ENCOUNTER — Other Ambulatory Visit: Payer: Medicare Other

## 2020-01-10 DIAGNOSIS — M329 Systemic lupus erythematosus, unspecified: Secondary | ICD-10-CM | POA: Diagnosis not present

## 2020-01-10 DIAGNOSIS — Z79899 Other long term (current) drug therapy: Secondary | ICD-10-CM | POA: Diagnosis not present

## 2020-01-10 DIAGNOSIS — M1990 Unspecified osteoarthritis, unspecified site: Secondary | ICD-10-CM | POA: Diagnosis not present

## 2020-01-10 DIAGNOSIS — M545 Low back pain: Secondary | ICD-10-CM | POA: Diagnosis not present

## 2020-01-10 DIAGNOSIS — M609 Myositis, unspecified: Secondary | ICD-10-CM | POA: Diagnosis not present

## 2020-01-27 DIAGNOSIS — H35372 Puckering of macula, left eye: Secondary | ICD-10-CM | POA: Diagnosis not present

## 2020-01-27 DIAGNOSIS — H2513 Age-related nuclear cataract, bilateral: Secondary | ICD-10-CM | POA: Diagnosis not present

## 2020-01-27 DIAGNOSIS — H35363 Drusen (degenerative) of macula, bilateral: Secondary | ICD-10-CM | POA: Diagnosis not present

## 2020-01-27 DIAGNOSIS — Z79899 Other long term (current) drug therapy: Secondary | ICD-10-CM | POA: Diagnosis not present

## 2020-06-13 DIAGNOSIS — M609 Myositis, unspecified: Secondary | ICD-10-CM | POA: Diagnosis not present

## 2020-06-13 DIAGNOSIS — M545 Low back pain: Secondary | ICD-10-CM | POA: Diagnosis not present

## 2020-06-13 DIAGNOSIS — Z79899 Other long term (current) drug therapy: Secondary | ICD-10-CM | POA: Diagnosis not present

## 2020-06-13 DIAGNOSIS — M1990 Unspecified osteoarthritis, unspecified site: Secondary | ICD-10-CM | POA: Diagnosis not present

## 2020-06-13 DIAGNOSIS — M329 Systemic lupus erythematosus, unspecified: Secondary | ICD-10-CM | POA: Diagnosis not present

## 2020-08-06 DIAGNOSIS — H35372 Puckering of macula, left eye: Secondary | ICD-10-CM | POA: Diagnosis not present

## 2020-08-06 DIAGNOSIS — H25013 Cortical age-related cataract, bilateral: Secondary | ICD-10-CM | POA: Diagnosis not present

## 2020-08-06 DIAGNOSIS — H35363 Drusen (degenerative) of macula, bilateral: Secondary | ICD-10-CM | POA: Diagnosis not present

## 2020-08-06 DIAGNOSIS — H2512 Age-related nuclear cataract, left eye: Secondary | ICD-10-CM | POA: Diagnosis not present

## 2020-08-06 DIAGNOSIS — Z79899 Other long term (current) drug therapy: Secondary | ICD-10-CM | POA: Diagnosis not present

## 2020-08-06 DIAGNOSIS — H2513 Age-related nuclear cataract, bilateral: Secondary | ICD-10-CM | POA: Diagnosis not present

## 2020-08-27 ENCOUNTER — Other Ambulatory Visit: Payer: Self-pay

## 2020-08-27 ENCOUNTER — Encounter (INDEPENDENT_AMBULATORY_CARE_PROVIDER_SITE_OTHER): Payer: Medicare Other | Admitting: Ophthalmology

## 2020-08-27 DIAGNOSIS — H35342 Macular cyst, hole, or pseudohole, left eye: Secondary | ICD-10-CM

## 2020-08-27 DIAGNOSIS — H43813 Vitreous degeneration, bilateral: Secondary | ICD-10-CM

## 2020-08-27 DIAGNOSIS — H35372 Puckering of macula, left eye: Secondary | ICD-10-CM

## 2020-08-27 DIAGNOSIS — H2513 Age-related nuclear cataract, bilateral: Secondary | ICD-10-CM | POA: Diagnosis not present

## 2020-09-08 ENCOUNTER — Other Ambulatory Visit: Payer: Self-pay | Admitting: Family Medicine

## 2020-09-11 DIAGNOSIS — H25812 Combined forms of age-related cataract, left eye: Secondary | ICD-10-CM | POA: Diagnosis not present

## 2020-09-11 DIAGNOSIS — H2512 Age-related nuclear cataract, left eye: Secondary | ICD-10-CM | POA: Diagnosis not present

## 2020-09-15 ENCOUNTER — Other Ambulatory Visit: Payer: Self-pay | Admitting: Family Medicine

## 2020-09-23 ENCOUNTER — Other Ambulatory Visit: Payer: Self-pay | Admitting: Family Medicine

## 2020-09-23 DIAGNOSIS — M329 Systemic lupus erythematosus, unspecified: Secondary | ICD-10-CM

## 2020-09-23 DIAGNOSIS — D649 Anemia, unspecified: Secondary | ICD-10-CM

## 2020-09-23 DIAGNOSIS — E785 Hyperlipidemia, unspecified: Secondary | ICD-10-CM

## 2020-09-23 DIAGNOSIS — E039 Hypothyroidism, unspecified: Secondary | ICD-10-CM

## 2020-09-24 DIAGNOSIS — M1990 Unspecified osteoarthritis, unspecified site: Secondary | ICD-10-CM | POA: Diagnosis not present

## 2020-09-24 DIAGNOSIS — Z23 Encounter for immunization: Secondary | ICD-10-CM | POA: Diagnosis not present

## 2020-09-24 DIAGNOSIS — Z79899 Other long term (current) drug therapy: Secondary | ICD-10-CM | POA: Diagnosis not present

## 2020-09-24 DIAGNOSIS — M545 Low back pain: Secondary | ICD-10-CM | POA: Diagnosis not present

## 2020-09-24 DIAGNOSIS — M609 Myositis, unspecified: Secondary | ICD-10-CM | POA: Diagnosis not present

## 2020-09-24 DIAGNOSIS — M329 Systemic lupus erythematosus, unspecified: Secondary | ICD-10-CM | POA: Diagnosis not present

## 2020-10-01 ENCOUNTER — Other Ambulatory Visit: Payer: Medicare Other

## 2020-10-05 ENCOUNTER — Encounter: Payer: Medicare Other | Admitting: Family Medicine

## 2020-10-17 DIAGNOSIS — H35363 Drusen (degenerative) of macula, bilateral: Secondary | ICD-10-CM | POA: Diagnosis not present

## 2020-12-05 ENCOUNTER — Encounter (INDEPENDENT_AMBULATORY_CARE_PROVIDER_SITE_OTHER): Payer: Medicare Other | Admitting: Ophthalmology

## 2021-01-29 ENCOUNTER — Ambulatory Visit: Payer: Medicare Other | Admitting: Family Medicine

## 2021-01-29 DIAGNOSIS — Z0289 Encounter for other administrative examinations: Secondary | ICD-10-CM

## 2021-02-25 ENCOUNTER — Encounter (INDEPENDENT_AMBULATORY_CARE_PROVIDER_SITE_OTHER): Payer: Medicare Other | Admitting: Ophthalmology

## 2021-03-22 ENCOUNTER — Other Ambulatory Visit (INDEPENDENT_AMBULATORY_CARE_PROVIDER_SITE_OTHER): Payer: Medicare Other

## 2021-03-22 ENCOUNTER — Other Ambulatory Visit: Payer: Self-pay

## 2021-03-22 DIAGNOSIS — D649 Anemia, unspecified: Secondary | ICD-10-CM | POA: Diagnosis not present

## 2021-03-22 DIAGNOSIS — M329 Systemic lupus erythematosus, unspecified: Secondary | ICD-10-CM

## 2021-03-22 DIAGNOSIS — E785 Hyperlipidemia, unspecified: Secondary | ICD-10-CM | POA: Diagnosis not present

## 2021-03-22 DIAGNOSIS — E039 Hypothyroidism, unspecified: Secondary | ICD-10-CM

## 2021-03-22 LAB — COMPREHENSIVE METABOLIC PANEL
ALT: 23 U/L (ref 0–35)
AST: 30 U/L (ref 0–37)
Albumin: 4.3 g/dL (ref 3.5–5.2)
Alkaline Phosphatase: 111 U/L (ref 39–117)
BUN: 30 mg/dL — ABNORMAL HIGH (ref 6–23)
CO2: 32 mEq/L (ref 19–32)
Calcium: 9.4 mg/dL (ref 8.4–10.5)
Chloride: 106 mEq/L (ref 96–112)
Creatinine, Ser: 1.17 mg/dL (ref 0.40–1.20)
GFR: 45.46 mL/min — ABNORMAL LOW (ref >60.00)
Glucose, Bld: 104 mg/dL — ABNORMAL HIGH (ref 70–99)
Potassium: 4.8 mEq/L (ref 3.5–5.1)
Sodium: 144 mEq/L (ref 135–145)
Total Bilirubin: 0.6 mg/dL (ref 0.2–1.2)
Total Protein: 6.8 g/dL (ref 6.0–8.3)

## 2021-03-22 LAB — CBC WITH DIFFERENTIAL/PLATELET
Basophils Absolute: 0 10*3/uL (ref 0.0–0.1)
Basophils Relative: 0.3 % (ref 0.0–3.0)
Eosinophils Absolute: 0.1 10*3/uL (ref 0.0–0.7)
Eosinophils Relative: 2 % (ref 0.0–5.0)
HCT: 41.7 % (ref 36.0–46.0)
Hemoglobin: 14.1 g/dL (ref 12.0–15.0)
Lymphocytes Relative: 37 % (ref 12.0–46.0)
Lymphs Abs: 1.8 10*3/uL (ref 0.7–4.0)
MCHC: 33.7 g/dL (ref 30.0–36.0)
MCV: 90.6 fl (ref 78.0–100.0)
Monocytes Absolute: 0.5 10*3/uL (ref 0.1–1.0)
Monocytes Relative: 9.5 % (ref 3.0–12.0)
Neutro Abs: 2.5 10*3/uL (ref 1.4–7.7)
Neutrophils Relative %: 51.2 % (ref 43.0–77.0)
Platelets: 153 10*3/uL (ref 150.0–400.0)
RBC: 4.61 Mil/uL (ref 3.87–5.11)
RDW: 14.3 % (ref 11.5–15.5)
WBC: 4.9 10*3/uL (ref 4.0–10.5)

## 2021-03-22 LAB — LIPID PANEL
Cholesterol: 186 mg/dL (ref 0–200)
HDL: 42.1 mg/dL (ref >39.00)
LDL Cholesterol: 113 mg/dL — ABNORMAL HIGH (ref 0–99)
NonHDL: 144.1
Total CHOL/HDL Ratio: 4
Triglycerides: 155 mg/dL — ABNORMAL HIGH (ref 0.0–149.0)
VLDL: 31 mg/dL (ref 0.0–40.0)

## 2021-03-22 LAB — IBC PANEL
Iron: 52 ug/dL (ref 42–145)
Saturation Ratios: 14 % — ABNORMAL LOW (ref 20.0–50.0)
Transferrin: 265 mg/dL (ref 212.0–360.0)

## 2021-03-22 LAB — TSH: TSH: 1.57 u[IU]/mL (ref 0.35–4.50)

## 2021-03-22 LAB — VITAMIN D 25 HYDROXY (VIT D DEFICIENCY, FRACTURES): VITD: 45.47 ng/mL (ref 30.00–100.00)

## 2021-03-27 DIAGNOSIS — Z79899 Other long term (current) drug therapy: Secondary | ICD-10-CM | POA: Diagnosis not present

## 2021-03-27 DIAGNOSIS — M1990 Unspecified osteoarthritis, unspecified site: Secondary | ICD-10-CM | POA: Diagnosis not present

## 2021-03-27 DIAGNOSIS — M8580 Other specified disorders of bone density and structure, unspecified site: Secondary | ICD-10-CM | POA: Diagnosis not present

## 2021-03-27 DIAGNOSIS — M79643 Pain in unspecified hand: Secondary | ICD-10-CM | POA: Diagnosis not present

## 2021-03-27 DIAGNOSIS — M329 Systemic lupus erythematosus, unspecified: Secondary | ICD-10-CM | POA: Diagnosis not present

## 2021-03-27 DIAGNOSIS — M609 Myositis, unspecified: Secondary | ICD-10-CM | POA: Diagnosis not present

## 2021-03-27 DIAGNOSIS — M5459 Other low back pain: Secondary | ICD-10-CM | POA: Diagnosis not present

## 2021-03-27 DIAGNOSIS — M064 Inflammatory polyarthropathy: Secondary | ICD-10-CM | POA: Diagnosis not present

## 2021-03-27 DIAGNOSIS — M25552 Pain in left hip: Secondary | ICD-10-CM | POA: Diagnosis not present

## 2021-03-29 ENCOUNTER — Encounter: Payer: Self-pay | Admitting: Family Medicine

## 2021-03-29 ENCOUNTER — Ambulatory Visit (INDEPENDENT_AMBULATORY_CARE_PROVIDER_SITE_OTHER): Payer: Medicare Other | Admitting: Family Medicine

## 2021-03-29 ENCOUNTER — Other Ambulatory Visit: Payer: Self-pay

## 2021-03-29 VITALS — BP 128/72 | HR 73 | Temp 97.6°F | Ht 65.0 in | Wt 219.0 lb

## 2021-03-29 DIAGNOSIS — F419 Anxiety disorder, unspecified: Secondary | ICD-10-CM | POA: Diagnosis not present

## 2021-03-29 DIAGNOSIS — E039 Hypothyroidism, unspecified: Secondary | ICD-10-CM

## 2021-03-29 DIAGNOSIS — Z0000 Encounter for general adult medical examination without abnormal findings: Secondary | ICD-10-CM

## 2021-03-29 DIAGNOSIS — M069 Rheumatoid arthritis, unspecified: Secondary | ICD-10-CM

## 2021-03-29 DIAGNOSIS — Z7189 Other specified counseling: Secondary | ICD-10-CM

## 2021-03-29 MED ORDER — HYDROXYCHLOROQUINE SULFATE 200 MG PO TABS: 200.0000 mg | ORAL_TABLET | Freq: Every day | ORAL | Status: DC

## 2021-03-29 NOTE — Progress Notes (Signed)
This visit occurred during the SARS-CoV-2 public health emergency.  Safety protocols were in place, including screening questions prior to the visit, additional usage of staff PPE, and extensive cleaning of exam room while observing appropriate contact time as indicated for disinfecting solutions.  I have personally reviewed the Medicare Annual Wellness questionnaire and have noted 1. The patient's medical and social history 2. Their use of alcohol, tobacco or illicit drugs 3. Their current medications and supplements 4. The patient's functional ability including ADL's, fall risks, home safety risks and hearing or visual             impairment. 5. Diet and physical activities 6. Evidence for depression or mood disorders  The patients weight, height, BMI have been recorded in the chart and visual acuity is per eye clinic.  I have made referrals, counseling and provided education to the patient based review of the above and I have provided the pt with a written personalized care plan for preventive services.  Provider list updated- see scanned forms.  Routine anticipatory guidance given to patient.  See health maintenance. The possibility exists that previously documented standard health maintenance information may have been brought forward from a previous encounter into this note.  If needed, that same information has been updated to reflect the current situation based on today's encounter.    Flu up-to-date Shingles deferred 2022 given other issues. PNA previously done Tetanus 2011 Covid vaccine previously done. Colon cancer screening declined by patient. Breast cancer screening declined, discussed. Bone density test previously done Advance directive-both daughters designated patient were incapacitated. Cognitive function addressed- see scanned forms- and if abnormal then additional documentation follows.   Hypothyroidism.  No neck mass. No dysphagia.  Compliant with replacement.  Labs  discussed with patient.  She has some discomfort from varicose veins when supine, better walking.  No bleeding.  Mood d/w pt.  Her hearing aids didn't help.  She is trying to get by w/o aids.  Some days are worse than others.  She has extended family at home, d/w pt.  Still on lexapro.  No adverse effect on medication.  Arthritis.  She is on lower dose of plaquenil now, without a change in sx.  She'll have routine eye exam soon.    PMH and SH reviewed  Meds, vitals, and allergies reviewed.   ROS: Per HPI.  Unless specifically indicated otherwise in HPI, the patient denies:  General: fever. Eyes: acute vision changes ENT: sore throat Cardiovascular: chest pain Respiratory: SOB GI: vomiting GU: dysuria Musculoskeletal: acute back pain Derm: acute rash Neuro: acute motor dysfunction Psych: worsening mood Endocrine: polydipsia Heme: bleeding Allergy: hayfever  GEN: nad, alert and oriented HEENT: ncat, HOH at baseline.   NECK: supple w/o LA CV: rrr. PULM: ctab, no inc wob ABD: soft, +bs EXT: no edema She has chronic IP joint changes at baseline in the hands. SKIN: no acute rash but 18mm likely SK, had been present for years, partly removed by patient, irritated.  dw pt about liq N2 tx. verbal informed consent obtained and frozen x3 without complication, routine post cryotherapy instructions given to patient.

## 2021-03-29 NOTE — Patient Instructions (Signed)
Don't change your meds for now.  The spot should blister and then heal.  Update me as needed.  Take care.  Glad to see you.

## 2021-03-31 NOTE — Assessment & Plan Note (Signed)
TSH normal.  Would continue levothyroxine.  Recheck periodically.  She agrees.

## 2021-03-31 NOTE — Assessment & Plan Note (Signed)
Would continue as is with Lexapro.  She is putting up with hearing loss as her hearing aids did not help.  She will update me as needed.

## 2021-03-31 NOTE — Assessment & Plan Note (Signed)
Per outside clinic, now on lower dose of Plaquenil.  I will defer.  She agrees.

## 2021-03-31 NOTE — Assessment & Plan Note (Signed)
Advance directive-both daughters designated patient were incapacitated.

## 2021-03-31 NOTE — Assessment & Plan Note (Signed)
Flu up-to-date Shingles deferred 2022 given other issues. PNA previously done Tetanus 2011 Covid vaccine previously done. Colon cancer screening declined by patient. Breast cancer screening declined, discussed. Bone density test previously done Advance directive-both daughters designated patient were incapacitated. Cognitive function addressed- see scanned forms- and if abnormal then additional documentation follows.

## 2021-04-18 ENCOUNTER — Other Ambulatory Visit: Payer: Self-pay

## 2021-04-18 ENCOUNTER — Emergency Department (HOSPITAL_COMMUNITY): Payer: Medicare Other

## 2021-04-18 ENCOUNTER — Inpatient Hospital Stay (HOSPITAL_COMMUNITY)
Admission: EM | Admit: 2021-04-18 | Discharge: 2021-04-21 | DRG: 871 | Disposition: A | Discharge status: Home / Self Care | Payer: Medicare Other | Attending: Internal Medicine | Admitting: Internal Medicine

## 2021-04-18 ENCOUNTER — Encounter (HOSPITAL_COMMUNITY): Payer: Self-pay

## 2021-04-18 DIAGNOSIS — I5032 Chronic diastolic (congestive) heart failure: Secondary | ICD-10-CM | POA: Diagnosis not present

## 2021-04-18 DIAGNOSIS — R197 Diarrhea, unspecified: Secondary | ICD-10-CM | POA: Diagnosis present

## 2021-04-18 DIAGNOSIS — Z743 Need for continuous supervision: Secondary | ICD-10-CM | POA: Diagnosis not present

## 2021-04-18 DIAGNOSIS — R531 Weakness: Secondary | ICD-10-CM | POA: Diagnosis present

## 2021-04-18 DIAGNOSIS — R6889 Other general symptoms and signs: Secondary | ICD-10-CM | POA: Diagnosis not present

## 2021-04-18 DIAGNOSIS — J101 Influenza due to other identified influenza virus with other respiratory manifestations: Secondary | ICD-10-CM | POA: Diagnosis present

## 2021-04-18 DIAGNOSIS — D631 Anemia in chronic kidney disease: Secondary | ICD-10-CM | POA: Diagnosis present

## 2021-04-18 DIAGNOSIS — Z823 Family history of stroke: Secondary | ICD-10-CM | POA: Diagnosis not present

## 2021-04-18 DIAGNOSIS — Z95828 Presence of other vascular implants and grafts: Secondary | ICD-10-CM | POA: Diagnosis not present

## 2021-04-18 DIAGNOSIS — Z803 Family history of malignant neoplasm of breast: Secondary | ICD-10-CM

## 2021-04-18 DIAGNOSIS — D84821 Immunodeficiency due to drugs: Secondary | ICD-10-CM | POA: Diagnosis not present

## 2021-04-18 DIAGNOSIS — D696 Thrombocytopenia, unspecified: Secondary | ICD-10-CM | POA: Diagnosis not present

## 2021-04-18 DIAGNOSIS — F32A Depression, unspecified: Secondary | ICD-10-CM | POA: Diagnosis present

## 2021-04-18 DIAGNOSIS — E669 Obesity, unspecified: Secondary | ICD-10-CM | POA: Diagnosis present

## 2021-04-18 DIAGNOSIS — E6609 Other obesity due to excess calories: Secondary | ICD-10-CM

## 2021-04-18 DIAGNOSIS — R0902 Hypoxemia: Secondary | ICD-10-CM | POA: Diagnosis not present

## 2021-04-18 DIAGNOSIS — Z86718 Personal history of other venous thrombosis and embolism: Secondary | ICD-10-CM

## 2021-04-18 DIAGNOSIS — Z86711 Personal history of pulmonary embolism: Secondary | ICD-10-CM | POA: Diagnosis not present

## 2021-04-18 DIAGNOSIS — R404 Transient alteration of awareness: Secondary | ICD-10-CM | POA: Diagnosis not present

## 2021-04-18 DIAGNOSIS — H9190 Unspecified hearing loss, unspecified ear: Secondary | ICD-10-CM | POA: Diagnosis present

## 2021-04-18 DIAGNOSIS — E039 Hypothyroidism, unspecified: Secondary | ICD-10-CM | POA: Diagnosis present

## 2021-04-18 DIAGNOSIS — M329 Systemic lupus erythematosus, unspecified: Secondary | ICD-10-CM

## 2021-04-18 DIAGNOSIS — N189 Chronic kidney disease, unspecified: Secondary | ICD-10-CM | POA: Diagnosis not present

## 2021-04-18 DIAGNOSIS — Z825 Family history of asthma and other chronic lower respiratory diseases: Secondary | ICD-10-CM | POA: Diagnosis not present

## 2021-04-18 DIAGNOSIS — Z888 Allergy status to other drugs, medicaments and biological substances status: Secondary | ICD-10-CM

## 2021-04-18 DIAGNOSIS — Z8249 Family history of ischemic heart disease and other diseases of the circulatory system: Secondary | ICD-10-CM

## 2021-04-18 DIAGNOSIS — N179 Acute kidney failure, unspecified: Secondary | ICD-10-CM

## 2021-04-18 DIAGNOSIS — N1832 Chronic kidney disease, stage 3b: Secondary | ICD-10-CM | POA: Diagnosis present

## 2021-04-18 DIAGNOSIS — A4189 Other specified sepsis: Secondary | ICD-10-CM | POA: Diagnosis not present

## 2021-04-18 DIAGNOSIS — M069 Rheumatoid arthritis, unspecified: Secondary | ICD-10-CM | POA: Diagnosis not present

## 2021-04-18 DIAGNOSIS — Z833 Family history of diabetes mellitus: Secondary | ICD-10-CM

## 2021-04-18 DIAGNOSIS — Z20822 Contact with and (suspected) exposure to covid-19: Secondary | ICD-10-CM | POA: Diagnosis not present

## 2021-04-18 DIAGNOSIS — K219 Gastro-esophageal reflux disease without esophagitis: Secondary | ICD-10-CM | POA: Diagnosis not present

## 2021-04-18 DIAGNOSIS — J111 Influenza due to unidentified influenza virus with other respiratory manifestations: Secondary | ICD-10-CM

## 2021-04-18 DIAGNOSIS — I517 Cardiomegaly: Secondary | ICD-10-CM | POA: Diagnosis not present

## 2021-04-18 DIAGNOSIS — Z6836 Body mass index (BMI) 36.0-36.9, adult: Secondary | ICD-10-CM | POA: Diagnosis not present

## 2021-04-18 DIAGNOSIS — R6520 Severe sepsis without septic shock: Secondary | ICD-10-CM | POA: Diagnosis present

## 2021-04-18 DIAGNOSIS — J9601 Acute respiratory failure with hypoxia: Secondary | ICD-10-CM

## 2021-04-18 DIAGNOSIS — N289 Disorder of kidney and ureter, unspecified: Secondary | ICD-10-CM

## 2021-04-18 DIAGNOSIS — Z79899 Other long term (current) drug therapy: Secondary | ICD-10-CM

## 2021-04-18 DIAGNOSIS — A419 Sepsis, unspecified organism: Secondary | ICD-10-CM | POA: Diagnosis not present

## 2021-04-18 DIAGNOSIS — Z79890 Hormone replacement therapy (postmenopausal): Secondary | ICD-10-CM

## 2021-04-18 LAB — CBC WITH DIFFERENTIAL/PLATELET
Abs Immature Granulocytes: 0.02 10*3/uL (ref 0.00–0.07)
Basophils Absolute: 0 10*3/uL (ref 0.0–0.1)
Basophils Relative: 0 %
Eosinophils Absolute: 0 10*3/uL (ref 0.0–0.5)
Eosinophils Relative: 0 %
HCT: 40.3 % (ref 36.0–46.0)
Hemoglobin: 13 g/dL (ref 12.0–15.0)
Immature Granulocytes: 1 %
Lymphocytes Relative: 10 %
Lymphs Abs: 0.4 10*3/uL — ABNORMAL LOW (ref 0.7–4.0)
MCH: 30.2 pg (ref 26.0–34.0)
MCHC: 32.3 g/dL (ref 30.0–36.0)
MCV: 93.7 fL (ref 80.0–100.0)
Monocytes Absolute: 0.4 10*3/uL (ref 0.1–1.0)
Monocytes Relative: 10 %
Neutro Abs: 3.2 10*3/uL (ref 1.7–7.7)
Neutrophils Relative %: 79 %
Platelets: 130 10*3/uL — ABNORMAL LOW (ref 150–400)
RBC: 4.3 MIL/uL (ref 3.87–5.11)
RDW: 12.5 % (ref 11.5–15.5)
WBC: 4 10*3/uL (ref 4.0–10.5)
nRBC: 0 % (ref 0.0–0.2)

## 2021-04-18 LAB — COMPREHENSIVE METABOLIC PANEL
ALT: 20 U/L (ref 0–44)
AST: 33 U/L (ref 15–41)
Albumin: 3.5 g/dL (ref 3.5–5.0)
Alkaline Phosphatase: 81 U/L (ref 38–126)
Anion gap: 11 (ref 5–15)
BUN: 34 mg/dL — ABNORMAL HIGH (ref 8–23)
CO2: 25 mmol/L (ref 22–32)
Calcium: 8.8 mg/dL — ABNORMAL LOW (ref 8.9–10.3)
Chloride: 103 mmol/L (ref 98–111)
Creatinine, Ser: 1.56 mg/dL — ABNORMAL HIGH (ref 0.44–1.00)
GFR, Estimated: 34 mL/min — ABNORMAL LOW (ref >60)
Glucose, Bld: 126 mg/dL — ABNORMAL HIGH (ref 70–99)
Potassium: 4.2 mmol/L (ref 3.5–5.1)
Sodium: 139 mmol/L (ref 135–145)
Total Bilirubin: 0.7 mg/dL (ref 0.3–1.2)
Total Protein: 6.7 g/dL (ref 6.5–8.1)

## 2021-04-18 LAB — RESP PANEL BY RT-PCR (FLU A&B, COVID) ARPGX2
Influenza A by PCR: POSITIVE — AB
Influenza B by PCR: NEGATIVE
SARS Coronavirus 2 by RT PCR: NEGATIVE

## 2021-04-18 LAB — PROTIME-INR
INR: 1.1 (ref 0.8–1.2)
Prothrombin Time: 14.5 seconds (ref 11.4–15.2)

## 2021-04-18 LAB — LACTIC ACID, PLASMA
Lactic Acid, Venous: 1.7 mmol/L (ref 0.5–1.9)
Lactic Acid, Venous: 1.2 mmol/L (ref 0.5–1.9)

## 2021-04-18 LAB — APTT: aPTT: 20 seconds — ABNORMAL LOW (ref 24–36)

## 2021-04-18 MED ORDER — LACTATED RINGERS IV SOLN: INTRAVENOUS | Status: DC

## 2021-04-18 MED ORDER — CALCIUM GLUCONATE-NACL 1-0.675 GM/50ML-% IV SOLN
1.0000 g | Freq: Once | INTRAVENOUS | Status: AC
  Administered 2021-04-18: 1000 mg via INTRAVENOUS
  Filled 2021-04-18: qty 50

## 2021-04-18 MED ORDER — ACETAMINOPHEN 325 MG PO TABS
650.0000 mg | ORAL_TABLET | Freq: Once | ORAL | Status: AC
  Administered 2021-04-18: 650 mg via ORAL
  Filled 2021-04-18: qty 2

## 2021-04-18 MED ORDER — TRAMADOL HCL 50 MG PO TABS
50.0000 mg | ORAL_TABLET | Freq: Once | ORAL | Status: AC
  Administered 2021-04-18: 50 mg via ORAL
  Filled 2021-04-18: qty 1

## 2021-04-18 MED ORDER — POLYVINYL ALCOHOL 1.4 % OP SOLN
1.0000 [drp] | Freq: Four times a day (QID) | OPHTHALMIC | Status: DC
  Administered 2021-04-18 – 2021-04-19 (×6): 1 [drp] via OPHTHALMIC
  Administered 2021-04-20 (×2): 2 [drp] via OPHTHALMIC
  Administered 2021-04-20 – 2021-04-21 (×4): 1 [drp] via OPHTHALMIC
  Filled 2021-04-18 (×2): qty 15

## 2021-04-18 MED ORDER — OSELTAMIVIR PHOSPHATE 30 MG PO CAPS
30.0000 mg | ORAL_CAPSULE | Freq: Two times a day (BID) | ORAL | Status: DC
  Administered 2021-04-18 – 2021-04-21 (×6): 30 mg via ORAL
  Filled 2021-04-18 (×7): qty 1

## 2021-04-18 MED ORDER — GUAIFENESIN ER 600 MG PO TB12
600.0000 mg | ORAL_TABLET | Freq: Two times a day (BID) | ORAL | Status: DC
  Administered 2021-04-18 – 2021-04-21 (×6): 600 mg via ORAL
  Filled 2021-04-18 (×7): qty 1

## 2021-04-18 MED ORDER — LEVOTHYROXINE SODIUM 112 MCG PO TABS
112.0000 ug | ORAL_TABLET | ORAL | Status: DC
  Administered 2021-04-18 – 2021-04-20 (×3): 112 ug via ORAL
  Filled 2021-04-18 (×3): qty 1

## 2021-04-18 MED ORDER — LEVOTHYROXINE SODIUM 112 MCG PO TABS
56.0000 ug | ORAL_TABLET | ORAL | Status: DC
  Administered 2021-04-21: 56 ug via ORAL
  Filled 2021-04-18: qty 1

## 2021-04-18 MED ORDER — SODIUM CHLORIDE 0.9% FLUSH
3.0000 mL | Freq: Two times a day (BID) | INTRAVENOUS | Status: DC
  Administered 2021-04-18 – 2021-04-21 (×7): 3 mL via INTRAVENOUS

## 2021-04-18 MED ORDER — SODIUM CHLORIDE 0.9 % IV SOLN
INTRAVENOUS | Status: DC
  Administered 2021-04-19: 1000 mL via INTRAVENOUS

## 2021-04-18 MED ORDER — ACETAMINOPHEN 325 MG PO TABS
650.0000 mg | ORAL_TABLET | Freq: Four times a day (QID) | ORAL | Status: DC | PRN
  Filled 2021-04-18: qty 2

## 2021-04-18 MED ORDER — SODIUM CHLORIDE 0.9 % IV BOLUS
500.0000 mL | Freq: Once | INTRAVENOUS | Status: AC
  Administered 2021-04-18: 500 mL via INTRAVENOUS

## 2021-04-18 MED ORDER — ENOXAPARIN SODIUM 30 MG/0.3ML ~~LOC~~ SOLN: 30.0000 mg | SUBCUTANEOUS | Status: DC

## 2021-04-18 MED ORDER — ESCITALOPRAM OXALATE 10 MG PO TABS
10.0000 mg | ORAL_TABLET | Freq: Every day | ORAL | Status: DC
  Administered 2021-04-18 – 2021-04-21 (×4): 10 mg via ORAL
  Filled 2021-04-18 (×4): qty 1

## 2021-04-18 MED ORDER — ACETAMINOPHEN 650 MG RE SUPP: 650.0000 mg | Freq: Four times a day (QID) | RECTAL | Status: DC | PRN

## 2021-04-18 MED ORDER — AZATHIOPRINE 50 MG PO TABS
75.0000 mg | ORAL_TABLET | Freq: Every day | ORAL | Status: DC
  Administered 2021-04-19 – 2021-04-21 (×3): 75 mg via ORAL
  Filled 2021-04-18 (×4): qty 2

## 2021-04-18 MED ORDER — ONDANSETRON HCL 4 MG/2ML IJ SOLN: 4.0000 mg | Freq: Four times a day (QID) | INTRAMUSCULAR | Status: DC | PRN

## 2021-04-18 MED ORDER — NYSTATIN 100000 UNIT/GM EX POWD
1.0000 "application " | Freq: Two times a day (BID) | CUTANEOUS | Status: DC | PRN
  Filled 2021-04-18: qty 15

## 2021-04-18 MED ORDER — ONDANSETRON HCL 4 MG PO TABS: 4.0000 mg | ORAL_TABLET | Freq: Four times a day (QID) | ORAL | Status: DC | PRN

## 2021-04-18 MED ORDER — ENOXAPARIN SODIUM 40 MG/0.4ML ~~LOC~~ SOLN
40.0000 mg | SUBCUTANEOUS | Status: DC
  Administered 2021-04-19 – 2021-04-21 (×3): 40 mg via SUBCUTANEOUS
  Filled 2021-04-18 (×3): qty 0.4

## 2021-04-18 MED ORDER — OSELTAMIVIR PHOSPHATE 75 MG PO CAPS
75.0000 mg | ORAL_CAPSULE | Freq: Once | ORAL | Status: AC
  Administered 2021-04-18: 75 mg via ORAL
  Filled 2021-04-18: qty 1

## 2021-04-18 MED ORDER — ALBUTEROL SULFATE (2.5 MG/3ML) 0.083% IN NEBU: 2.5000 mg | INHALATION_SOLUTION | Freq: Four times a day (QID) | RESPIRATORY_TRACT | Status: DC | PRN

## 2021-04-18 MED ORDER — HYDROXYCHLOROQUINE SULFATE 200 MG PO TABS
200.0000 mg | ORAL_TABLET | Freq: Every day | ORAL | Status: DC
  Administered 2021-04-19 – 2021-04-21 (×3): 200 mg via ORAL
  Filled 2021-04-18 (×4): qty 1

## 2021-04-18 MED ORDER — ONDANSETRON HCL 4 MG/2ML IJ SOLN
4.0000 mg | Freq: Once | INTRAMUSCULAR | Status: AC
  Administered 2021-04-18: 4 mg via INTRAVENOUS
  Filled 2021-04-18: qty 2

## 2021-04-18 MED ORDER — LOPERAMIDE HCL 2 MG PO CAPS: 4.0000 mg | ORAL_CAPSULE | ORAL | Status: DC | PRN

## 2021-04-18 NOTE — ED Provider Notes (Signed)
Patient with flulike illness.  Alert lactic acid normal.  No leukocytosis.  Suspect COVID.  COVID testing is pending.  Patient's oxygenation off of 2 L of oxygen rapidly goes down to 87% on room air.  Patient not normally on oxygen at home.  Will require admission.  Patient has had pulmonary embolus in the past but most of this seems to be viral with a fever.  But currently her GFR is in the low 30s.  Most likely will improve with fluids.  But not a candidate based on that for CT angio chest at this time.  Discussed with the hospitalist service for admission.  Patient followed by Melvenia Needles.   Fredia Sorrow, MD 04/18/21 [phone]

## 2021-04-18 NOTE — ED Triage Notes (Signed)
EMS arrival from home, co n/v/d/cough/fever since Sunday. Orthro positive with EMS, hx lupus  20G LAC 4mg  zofran 538mL NS bolus  Pt hard of hearing, A/O X4  Pt 88% on RA, no home O2, placed on 2L [place]

## 2021-04-18 NOTE — ED Notes (Signed)
Placed pt back on 2L O2 [place], O2 was 88% on RA

## 2021-04-18 NOTE — ED Provider Notes (Signed)
[facility] EMERGENCY DEPARTMENT Provider Note   CSN: [id number] Arrival date & time: 04/18/21  0601   History Chief complaint: Flulike illness.  Hannah Pitts is a 75 y.o. female.  The history is provided by the patient.  She has history of lupus, pulmonary embolism and comes in with flulike symptoms for the last 4 days.  She has had a nonproductive cough, nausea, diarrhea.  She started running a fever today, which has been as high as 101 degrees.  She has had chills and sweats.  She denies arthralgias or myalgias.  She has had sick contacts within the family with similar illness.  She has received the COVID-19 vaccine, including booster.  She has also received her influenza immunization.  Past Medical History:  Diagnosis Date  . Abnormal involuntary movements(781.0)   . Anemia of other chronic disease   . Depressive disorder, not elsewhere classified   . Diaphragmatic hernia without mention of obstruction or gangrene   . Dysthymic disorder   . Generalized hyperhidrosis   . GERD (gastroesophageal reflux disease)   . H/O hiatal hernia   . History of pulmonary embolus (PE)   . Lymphoma ([place]) 03/2010   "they got it all w/surgery"  . Meniere's disease of left ear   . Migraine    "used to have classic kind; not anymore; last one was in 2012"  . Other rheumatoid arthritis with visceral or systemic involvement   . Palpitations   . Personal history of other disorders of nervous system and sense organs   . Systemic lupus erythematosus ([place])   . Unspecified disease of pericardium    Pericarditis   . Unspecified hypothyroidism   . Urticaria, unspecified     Patient Active Problem List   Diagnosis Date Noted  . Elevated serum creatinine 01/20/2018  . Pulmonary embolism ([place]) 12/12/2017  . Congestive heart failure (CHF) ([place]) 12/12/2017  . Obesity hypoventilation syndrome ([place]) 12/12/2017  . Fall at home 08/05/2016  . COPD with acute bronchitis ([place]) 12/12/2014   . Hearing loss 12/12/2014  . Low back pain 10/11/2014  . Advance care planning 07/31/2014  . Abnormality of gait 07/31/2014  . Dyspnea on exertion 07/20/2013  . Obesity 07/20/2013  . Medicare annual wellness visit, subsequent 07/27/2012  . Palpitations 03/23/2012  . Anxiety 06/10/2011  . RASH AND OTHER NONSPECIFIC SKIN ERUPTION 09/13/2010  . Dysphagia 03/12/2010  . LUPUS 09/25/2009  . SKIN LESION 09/21/2007  . Tremor 05/28/2007  . Hypothyroidism 05/27/2007  . Anemia 05/27/2007  . PERIPHERAL VASCULAR DISEASE 05/27/2007  . GERD 05/27/2007  . Rheumatoid arthritis ([place]) 05/27/2007  . MENIERE'S DISEASE, HX OF 05/27/2007    Past Surgical History:  Procedure Laterality Date  . ABDOMINAL HYSTERECTOMY  1978  . APPENDECTOMY  1974  . BREAST BIOPSY  03/2010   left breast;   . BREAST LUMPECTOMY  03/2010   left breast; nonHodgkins lymphoma; "not breast cancer"  . CHOLECYSTECTOMY  1974  . ESOPHAGEAL DILATION  ?02/2011  . ESOPHAGOGASTRODUODENOSCOPY  01-24-11   Slight stenosis esoph spasm  . IR IVC FILTER PLMT / S&I /IMG GUID/MOD SED  12/17/2017  . Left VATS  12/1997  . [place]  . shunt     put in behind left ear ; Menier's syndrome     OB History   No obstetric history on file.     Family History  Problem Relation Age of Onset  . Heart failure Mother   . Stroke Father   .  Coronary artery disease Sister   . COPD Sister   . Coronary artery disease Brother   . Diabetes Brother   . Heart failure Brother   . Coronary artery disease Sister   . Coronary artery disease Sister   . Cancer Sister   . Heart failure Sister   . Cancer Sister        lung, esoph  . Cancer Sister        lung  . Breast cancer Maternal Aunt   . Colon cancer Neg Hx     Social History   Tobacco Use  . Smoking status: Never Smoker  . Smokeless tobacco: Never Used  Substance Use Topics  . Alcohol use: No  . Drug use: No    Home Medications Prior to Admission medications    Medication Sig Start Date End Date Taking? Authorizing Provider  acetaminophen (TYLENOL) 500 MG tablet Take 500 mg by mouth every 6 (six) hours as needed.    [provider]  azaTHIOprine (IMURAN) 50 MG tablet Take 75 mg by mouth daily.    [provider]  Cholecalciferol (VITAMIN D) 1000 UNITS capsule Take 1,000 Units by mouth daily.     [provider]  escitalopram (LEXAPRO) 10 MG tablet TAKE 1 TABLET BY MOUTH EVERY DAY 09/17/20   Tonia Ghent, MD  hydroxychloroquine (PLAQUENIL) 200 MG tablet Take 1 tablet (200 mg total) by mouth daily. 03/29/21   Tonia Ghent, MD  levothyroxine (SYNTHROID) 112 MCG tablet TAKE 1 TABLET BY MOUTH EVERY DAY EXCEPT ON SUNDAY ONLY TAKE 1/2 TABLET 09/10/20   Tonia Ghent, MD  nystatin (MYCOSTATIN/NYSTOP) powder Apply topically 2 (two) times daily as needed. 09/15/19   Tonia Ghent, MD    Allergies    Heparin  Review of Systems   Review of Systems  All other systems reviewed and are negative.   Physical Exam Updated Vital Signs BP (!) 157/88 (BP Location: Right Arm)   Pulse 81   Temp (!) 101.4 F (38.6 C) (Oral)   Resp 18   Ht 5\' 5"  (1.651 m)   Wt 99.3 kg   SpO2 (!) 88%   BMI 36.43 kg/m   Physical Exam Vitals and nursing note reviewed.   75 year old female, resting comfortably and in no acute distress. Vital signs are significant for elevated blood pressure. Oxygen saturation is 88%, which is hypoxic.  She is placed on nasal oxygen with oxygen saturation improving to 93%. Head is normocephalic and atraumatic. PERRLA, EOMI. Oropharynx is clear. Neck is nontender and supple without adenopathy or JVD. Back is nontender and there is no CVA tenderness. Lungs are clear without rales, wheezes, or rhonchi. Chest is nontender. Heart has regular rate and rhythm without murmur. Abdomen is soft, flat, nontender without masses or hepatosplenomegaly and peristalsis is hypoactive. Extremities have no cyanosis or edema,  full range of motion is present. Skin is warm and dry without rash. Neurologic: Mental status is normal, cranial nerves are intact, there are no motor or sensory deficits.  ED Results / Procedures / Treatments   Labs (all labs ordered are listed, but only abnormal results are displayed) Labs Reviewed  COMPREHENSIVE METABOLIC PANEL - Abnormal; Notable for the following components:      Result Value   Glucose, Bld 126 (*)    BUN 34 (*)    Creatinine, Ser 1.56 (*)    Calcium 8.8 (*)    GFR, Estimated 34 (*)    All other components  within normal limits  CBC WITH DIFFERENTIAL/PLATELET - Abnormal; Notable for the following components:   Platelets 130 (*)    Lymphs Abs 0.4 (*)    All other components within normal limits  APTT - Abnormal; Notable for the following components:   aPTT 20 (*)    All other components within normal limits  URINE CULTURE  CULTURE, BLOOD (ROUTINE X 2)  CULTURE, BLOOD (ROUTINE X 2)  RESP PANEL BY RT-PCR (FLU A&B, COVID) ARPGX2  LACTIC ACID, PLASMA  PROTIME-INR  LACTIC ACID, PLASMA  URINALYSIS, ROUTINE W REFLEX MICROSCOPIC    EKG EKG Interpretation  Date/Time:  Thursday April 18 2021 06:22:35 EDT Ventricular Rate:  84 PR Interval:  144 QRS Duration: 101 QT Interval:  373 QTC Calculation: 441 R Axis:   -36 Text Interpretation: Sinus rhythm Incomplete RBBB and LAFB Abnormal R-wave progression, late transition Repol abnrm suggests ischemia, lateral leads When compared with ECG of 12/12/2017, REPOLARIZATION ABNORMALITY is less prominent Confirmed by Delora Fuel ([id number]) on 04/18/2021 6:38:51 AM   Radiology No results found.  Procedures Procedures   Medications Ordered in ED Medications  lactated ringers infusion (has no administration in time range)  ondansetron (ZOFRAN) injection 4 mg (has no administration in time range)    ED Course  I have reviewed the triage vital signs and the nursing notes.  Pertinent labs & imaging results that were  available during my care of the patient were reviewed by me and considered in my medical decision making (see chart for details).  MDM Rules/Calculators/A&P Fever and chills with nausea and diarrhea and cough, probable viral illness.  Consider influenza, consider COVID-19.  Patient is nontoxic-appearing, but with fever and hypoxia, is started on evolving sepsis pathway.  She is given acetaminophen for fever.  Old records are reviewed, and she has no relevant past visits.  Chest x-ray shows no evidence of pneumonia.  Labs show renal insufficiency in range that patient has been in previously.  Mild thrombocytopenia is also present which had been present previously.  Respiratory pathogen panel is pending.  Case is signed out to Dr. Rogene Houston.  Final Clinical Impression(s) / ED Diagnoses Final diagnoses:  Influenza-like illness  Hypoxia  Renal insufficiency  Thrombocytopenia ([place])    Rx / DC Orders ED Discharge Orders    None       Delora Fuel, MD 37/35/78 [phone]

## 2021-04-18 NOTE — H&P (Addendum)
History and Physical    Hannah Pitts NID:[id number] DOB: Jul 23, 1946 DOA: 04/18/2021  Referring MD/NP/PA: Fredia Sorrow, MD PCP: Tonia Ghent, MD  Patient coming from: Home via EMS  Chief Complaint: Flulike illness  I have personally briefly reviewed patient's old medical records in [place]   HPI: Hannah Pitts is a 75 y.o. female with medical history significant of lupus on immunosuppressive therapy, thrombocytopenia, history of PE not on anticoagulation s/p IVC filter, hypothyroidism, depression, and GERD presents with complaints of flulike illness over the last 3-4 days.  Symptoms started out with headache.  Subsequently patient developed mostly nonproductive cough, nausea, diarrhea, chills, and fevers noted to be as high as 101 F.  Due to her symptoms she had no appetite and reports other family members with a similar illness.  Denies any significant change in taste/smell, myalgias, abdominal pain, vomiting, leg swelling, or calf pain.  She had received her COVID-19 vaccine as well as her influenza vaccine.  Normally she does not require oxygen at baseline.  ED Course: Upon admission into the emergency department patient was seen to be febrile up to 101.4 F with respiration 18-24, O2 saturations as low as 87% on room air with improvement on 2 L nasal cannula oxygen to 95%, and all other vital signs stable.  Labs significant for WBC 4, platelets 130, BUN 34, creatinine 1.56, and lactic acid 1.7.  Sepsis protocol has been initiated with blood cultures obtained.  Chest x-ray with no acute findings when compared to previous imaging from 2018.  Patient was given acetaminophen, Zofran, normal saline 500 mL bolus, and started on a rate of 75 mL/h.  COVID-19 screening came back negative, but patient was positive for influenza A  Review of Systems  Constitutional: Positive for chills, fever and malaise/fatigue.  HENT: Positive for congestion and hearing loss.   Eyes: Negative  for photophobia and pain.  Respiratory: Positive for cough, sputum production and shortness of breath.   Cardiovascular: Negative for chest pain and leg swelling.  Gastrointestinal: Positive for diarrhea. Negative for abdominal pain.  Genitourinary: Negative for dysuria and frequency.  Musculoskeletal: Positive for back pain. Negative for myalgias.  Neurological: Positive for headaches. Negative for loss of consciousness.  Endo/Heme/Allergies: Negative for polydipsia.  Psychiatric/Behavioral: Negative for memory loss and substance abuse.    Past Medical History:  Diagnosis Date  . Abnormal involuntary movements(781.0)   . Anemia of other chronic disease   . Depressive disorder, not elsewhere classified   . Diaphragmatic hernia without mention of obstruction or gangrene   . Dysthymic disorder   . Generalized hyperhidrosis   . GERD (gastroesophageal reflux disease)   . H/O hiatal hernia   . History of pulmonary embolus (PE)   . Lymphoma ([place]) 03/2010   "they got it all w/surgery"  . Meniere's disease of left ear   . Migraine    "used to have classic kind; not anymore; last one was in 2012"  . Other rheumatoid arthritis with visceral or systemic involvement   . Palpitations   . Personal history of other disorders of nervous system and sense organs   . Systemic lupus erythematosus ([place])   . Unspecified disease of pericardium    Pericarditis   . Unspecified hypothyroidism   . Urticaria, unspecified     Past Surgical History:  Procedure Laterality Date  . ABDOMINAL HYSTERECTOMY  1978  . APPENDECTOMY  1974  . BREAST BIOPSY  03/2010   left breast;   . BREAST LUMPECTOMY  03/2010   left breast; nonHodgkins lymphoma; "not breast cancer"  . CHOLECYSTECTOMY  1974  . ESOPHAGEAL DILATION  ?02/2011  . ESOPHAGOGASTRODUODENOSCOPY  01-24-11   Slight stenosis esoph spasm  . IR IVC FILTER PLMT / S&I /IMG GUID/MOD SED  12/17/2017  . Left VATS  12/1997  . [place]  . shunt      put in behind left ear ; Menier's syndrome     reports that she has never smoked. She has never used smokeless tobacco. She reports that she does not drink alcohol and does not use drugs.  Allergies  Allergen Reactions  . Heparin     Bruising/bleeding    Family History  Problem Relation Age of Onset  . Heart failure Mother   . Stroke Father   . Coronary artery disease Sister   . COPD Sister   . Coronary artery disease Brother   . Diabetes Brother   . Heart failure Brother   . Coronary artery disease Sister   . Coronary artery disease Sister   . Cancer Sister   . Heart failure Sister   . Cancer Sister        lung, esoph  . Cancer Sister        lung  . Breast cancer Maternal Aunt   . Colon cancer Neg Hx     Prior to Admission medications   Medication Sig Start Date End Date Taking? Authorizing Provider  acetaminophen (TYLENOL) 500 MG tablet Take 500 mg by mouth every 6 (six) hours as needed.    [provider]  azaTHIOprine (IMURAN) 50 MG tablet Take 75 mg by mouth daily.    [provider]  Cholecalciferol (VITAMIN D) 1000 UNITS capsule Take 1,000 Units by mouth daily.     [provider]  escitalopram (LEXAPRO) 10 MG tablet TAKE 1 TABLET BY MOUTH EVERY DAY 09/17/20   Tonia Ghent, MD  hydroxychloroquine (PLAQUENIL) 200 MG tablet Take 1 tablet (200 mg total) by mouth daily. 03/29/21   Tonia Ghent, MD  levothyroxine (SYNTHROID) 112 MCG tablet TAKE 1 TABLET BY MOUTH EVERY DAY EXCEPT ON SUNDAY ONLY TAKE 1/2 TABLET 09/10/20   Tonia Ghent, MD  nystatin (MYCOSTATIN/NYSTOP) powder Apply topically 2 (two) times daily as needed. 09/15/19   Tonia Ghent, MD    Physical Exam:  Constitutional: Elderly female who appears acutely ill Vitals:   04/18/21 0608 04/18/21 0610 04/18/21 0948  BP: (!) 157/88  (!) 136/52  Pulse: 81  89  Resp: 18  (!) 24  Temp: (!) 101.4 F (38.6 C)    TempSrc: Oral    SpO2: (!) 88%  95%  Weight:  99.3 kg    Height:  5\' 5"  (1.651 m)    Eyes: PERRL, lids and conjunctivae normal ENMT: Mucous membranes are dry. Posterior pharynx clear of any exudate or lesions.  Hard of hearing Neck: normal, supple, no masses, no thyromegaly Respiratory: Mildly tachypneic with no significant wheezes or rhonchi but decreased overall aeration.  O2 saturations currently maintained on 2 L of nasal cannula oxygen Cardiovascular: Regular rate and rhythm, no murmurs / rubs / gallops. No extremity edema. 2+ pedal pulses. No carotid bruits.  Abdomen: no tenderness, no masses palpated. No hepatosplenomegaly. Bowel sounds positive.  Musculoskeletal: no clubbing / cyanosis. No joint deformity upper and lower extremities. Good ROM, no contractures. Normal muscle tone.  Skin: no rashes, lesions, ulcers. No induration Neurologic: CN 2-12 grossly intact. Sensation intact, DTR normal.  Strength 5/5 in all 4.  Psychiatric: Normal judgment and insight. Alert and oriented x 3. Normal mood.     Labs on Admission: I have personally reviewed following labs and imaging studies  CBC: Recent Labs  Lab 04/18/21 0646  WBC 4.0  NEUTROABS 3.2  HGB 13.0  HCT 40.3  MCV 93.7  PLT 878*   Basic Metabolic Panel: Recent Labs  Lab 04/18/21 0646  NA 139  K 4.2  CL 103  CO2 25  GLUCOSE 126*  BUN 34*  CREATININE 1.56*  CALCIUM 8.8*   GFR: Estimated Creatinine Clearance: 35.8 mL/min (A) (by C-G formula based on SCr of 1.56 mg/dL (H)). Liver Function Tests: Recent Labs  Lab 04/18/21 0646  AST 33  ALT 20  ALKPHOS 81  BILITOT 0.7  PROT 6.7  ALBUMIN 3.5   No results for input(s): LIPASE, AMYLASE in the last 168 hours. No results for input(s): AMMONIA in the last 168 hours. Coagulation Profile: Recent Labs  Lab 04/18/21 0646  INR 1.1   Cardiac Enzymes: No results for input(s): CKTOTAL, CKMB, CKMBINDEX, TROPONINI in the last 168 hours. BNP (last 3 results) No results for input(s): PROBNP in the last 8760  hours. HbA1C: No results for input(s): HGBA1C in the last 72 hours. CBG: No results for input(s): GLUCAP in the last 168 hours. Lipid Profile: No results for input(s): CHOL, HDL, LDLCALC, TRIG, CHOLHDL, LDLDIRECT in the last 72 hours. Thyroid Function Tests: No results for input(s): TSH, T4TOTAL, FREET4, T3FREE, THYROIDAB in the last 72 hours. Anemia Panel: No results for input(s): VITAMINB12, FOLATE, FERRITIN, TIBC, IRON, RETICCTPCT in the last 72 hours. Urine analysis:    Component Value Date/Time   COLORURINE YELLOW 12/14/2017 1406   APPEARANCEUR CLEAR 12/14/2017 1406   LABSPEC 1.014 12/14/2017 1406   PHURINE 5.0 12/14/2017 1406   GLUCOSEU NEGATIVE 12/14/2017 1406   HGBUR NEGATIVE 12/14/2017 1406   HGBUR negative 12/27/2010 0801   BILIRUBINUR NEGATIVE 12/14/2017 1406   BILIRUBINUR Neg 05/20/2013 1010   KETONESUR NEGATIVE 12/14/2017 1406   PROTEINUR NEGATIVE 12/14/2017 1406   UROBILINOGEN negative 05/20/2013 1010   UROBILINOGEN 0.2 12/27/2010 0801   NITRITE NEGATIVE 12/14/2017 1406   LEUKOCYTESUR NEGATIVE 12/14/2017 1406   Sepsis Labs: No results found for this or any previous visit (from the past 240 hour(s)).   Radiological Exams on Admission: DG Chest Port 1 View  Result Date: 04/18/2021 CLINICAL DATA:  Questionable sepsis EXAM: PORTABLE CHEST 1 VIEW COMPARISON:  12/17/2017 FINDINGS: Mild cardiomegaly. Stable aortic and hilar contours. Interstitial coarsening at the bases which is unchanged. There is no edema, consolidation, effusion, or pneumothorax. IMPRESSION: No acute finding when compared to 2018. Electronically Signed   By: Monte Fantasia M.D.   On: 04/18/2021 07:00    EKG: Independently reviewed.  Sinus rhythm at 84 bpm with incomplete RBBB and LAFB   Assessment/Plan sepsis secondary to influenza A with acute respiratory failure with hypoxia: Patient presents with complaints of fever, chills, nausea, diarrhea, and headache over the last 3 to 4 days.  Initially  noted to be tachypneic and febrile up to 101.3 F meeting SIRS criteria.  She was found to be positive for influenza A as likely source.  Lactic acids were reassuring at 1.7.   patient noted to be initially hypoxic on room air down to 87% with improvement on 2 L nasal cannula oxygen.  Chest x-ray otherwise clear. -Admit to a medical telemetry bed  -Droplet precautions -Continuous pulse oximetry with nasal cannula oxygen to maintain  O2 saturation greater than 90% -Tamiflu 75 mg p.o. x1 dose, then start 30 mg twice daily given renal function -Albuterol inhaler as needed for shortness of breath/wheezing -Imodium as needed for diarrhea -Mucinex  Acute kidney injury superimposed on chronic kidney disease stage IIIa: Patient presents with a creatinine 1.56 with BUN 34.  Baseline creatinine previously noted to be 1.17.  Suspect prerenal cause of symptoms given elevated BUN to creatinine ratio. -Continue IV fluids at 75 mL/h overnight -Recheck kidney function in a.m.   Lupus on chronic immunosuppressive therapy -Continue azathioprine and Plaquenil  History of DVT/PE s/p IVC filter: Patient with a prior history of DVT back in 11/2017 status post IVC filter due to a left rectus abdominis hematoma.   Diastolic CHF: Patient appears to be relatively euvolemic at this time.  Chest x-ray was otherwise noted to be clear.  Last echocardiogram revealed EF of 60 to 65% with grade 1 diastolic dysfunction back in 11/2017. -Strict intake and output  -daily weights  Hypothyroidism -Continue levothyroxine  Depression -Continue Lexapro  Thrombocytopenia: Chronic.  Platelet count 130.  Patient denies any reports of bleeding. -Continue to monitor  Obesity:BMI 36.43 kg/m DVT prophylaxis: Low-dose Lovenox for DVT prophylaxis due to borderline kidney function Code Status: Full Family Communication: Updated over the phone Disposition Plan: Likely discharge home once medically stable Consults called: none   Admission status: Inpatient   Norval Morton MD [facility]   If 7PM-7AM, please contact night-coverage   04/18/2021, 9:54 AM

## 2021-04-18 NOTE — Progress Notes (Signed)
Received pt at 1545 hrs, alert and oriented, able to make needs known, must speak into her left ear, can not hear through right ear

## 2021-04-18 NOTE — Plan of Care (Signed)
  Problem: Education: Goal: Knowledge of General Education information will improve Description Including pain rating scale, medication(s)/side effects and non-pharmacologic comfort measures Outcome: Progressing   

## 2021-04-19 LAB — BASIC METABOLIC PANEL
Anion gap: 8 (ref 5–15)
BUN: 35 mg/dL — ABNORMAL HIGH (ref 8–23)
CO2: 25 mmol/L (ref 22–32)
Calcium: 8.6 mg/dL — ABNORMAL LOW (ref 8.9–10.3)
Chloride: 108 mmol/L (ref 98–111)
Creatinine, Ser: 1.33 mg/dL — ABNORMAL HIGH (ref 0.44–1.00)
GFR, Estimated: 41 mL/min — ABNORMAL LOW (ref >60)
Glucose, Bld: 86 mg/dL (ref 70–99)
Potassium: 4.2 mmol/L (ref 3.5–5.1)
Sodium: 141 mmol/L (ref 135–145)

## 2021-04-19 LAB — CBC
HCT: 39 % (ref 36.0–46.0)
Hemoglobin: 12.5 g/dL (ref 12.0–15.0)
MCH: 30 pg (ref 26.0–34.0)
MCHC: 32.1 g/dL (ref 30.0–36.0)
MCV: 93.5 fL (ref 80.0–100.0)
Platelets: 124 10*3/uL — ABNORMAL LOW (ref 150–400)
RBC: 4.17 MIL/uL (ref 3.87–5.11)
RDW: 12.8 % (ref 11.5–15.5)
WBC: 3 10*3/uL — ABNORMAL LOW (ref 4.0–10.5)
nRBC: 0 % (ref 0.0–0.2)

## 2021-04-19 LAB — MAGNESIUM: Magnesium: 1.5 mg/dL — ABNORMAL LOW (ref 1.7–2.4)

## 2021-04-19 MED ORDER — MAGNESIUM SULFATE 2 GM/50ML IV SOLN
2.0000 g | Freq: Once | INTRAVENOUS | Status: AC
  Administered 2021-04-19: 2 g via INTRAVENOUS
  Filled 2021-04-19: qty 50

## 2021-04-19 NOTE — Progress Notes (Addendum)
PROGRESS NOTE    Hannah Pitts  NGE:[id number] DOB: 1946/04/08 DOA: 04/18/2021 PCP: Tonia Ghent, MD   Brief Narrative:  HPI: Hannah Pitts is a 75 y.o. female with medical history significant of lupus on immunosuppressive therapy, thrombocytopenia, history of PE not on anticoagulation s/p IVC filter, hypothyroidism, depression, and GERD presents with complaints of flulike illness over the last 3-4 days.  Symptoms started out with headache.  Subsequently patient developed mostly nonproductive cough, nausea, diarrhea, chills, and fevers noted to be as high as 101 F.  Due to her symptoms she had no appetite and reports other family members with a similar illness.  Denies any significant change in taste/smell, myalgias, abdominal pain, vomiting, leg swelling, or calf pain.  She had received her COVID-19 vaccine as well as her influenza vaccine.  Normally she does not require oxygen at baseline.  ED Course: Upon admission into the emergency department patient was seen to be febrile up to 101.4 F with respiration 18-24, O2 saturations as low as 87% on room air with improvement on 2 L nasal cannula oxygen to 95%, and all other vital signs stable.  Labs significant for WBC 4, platelets 130, BUN 34, creatinine 1.56, and lactic acid 1.7.  Sepsis protocol has been initiated with blood cultures obtained.  Chest x-ray with no acute findings when compared to previous imaging from 2018.  Patient was given acetaminophen, Zofran, normal saline 500 mL bolus, and started on a rate of 75 mL/h.  COVID-19 screening came back negative, but patient was positive for influenza A  Assessment & Plan:   Principal Problem:   Acute respiratory failure with hypoxia ([place]) Active Problems:   Obesity   Acute kidney injury superimposed on chronic kidney disease ([place])   History of DVT (deep vein thrombosis)   History of pulmonary embolism   Thrombocytopenia ([facility])  Severe sepsis and acute hypoxic respiratory failure  secondary to influenza A infection: Presented with fever up to 101.3 F and tachypnea.  She was found to be positive for influenza A as likely source.  Lactic acids were reassuring at 1.7.   patient noted to be initially hypoxic on room air down to 87% with improvement on 2 L nasal cannula oxygen.  Chest x-ray otherwise clear.  Feels much better.  Now on room air and comfortable.  Continue Tamiflu.  Wheezy on exam.  No history of asthma or COPD.  Continue albuterol nebulizer.  CKD stage IIIb: Patient's baseline GFR seems to be between 35 and 45 and currently she is at her baseline.  Lupus on chronic immunosuppressive therapy -Continue azathioprine and Plaquenil  Hypomagnesemia: 1.5.  Will replace.   History of DVT/PE s/p IVC filter: Patient with a prior history of DVT back in 11/2017 status post IVC filter due to a left rectus abdominis hematoma.   Diastolic CHF: Patient appears to be relatively euvolemic at this time.  Chest x-ray was otherwise noted to be clear.  Last echocardiogram revealed EF of 60 to 65% with grade 1 diastolic dysfunction back in 11/2017. -Strict intake and output  -daily weights  Hypothyroidism -Continue levothyroxine  Depression -Continue Lexapro  Thrombocytopenia: Chronic and stable.  No signs of bleeding.  Monitor.  Generalized weakness: To be seen by PT OT.  DVT prophylaxis: enoxaparin (LOVENOX) injection 40 mg Start: 04/19/21 1300 SCDs Start: 04/18/21 1016   Code Status: Full Code  Family Communication:  None present at bedside.  Plan of care discussed with patient in length and he verbalized understanding  and agreed with it.  Status is: Inpatient  Remains inpatient appropriate because:Inpatient level of care appropriate due to severity of illness   Dispo: The patient is from: Home              Anticipated d/c is to: Home              Patient currently is not medically stable to d/c.   Difficult to place patient No        Estimated body  mass index is 36.43 kg/m as calculated from the following:   Height as of this encounter: 5\' 5"  (1.651 m).   Weight as of this encounter: 99.3 kg.      Nutritional status:               Consultants:   None  Procedures:   None  Antimicrobials:  Anti-infectives (From admission, onward)   Start     Dose/Rate Route Frequency Ordered Stop   04/18/21 2200  oseltamivir (TAMIFLU) capsule 30 mg        30 mg Oral 2 times daily 04/18/21 1136 04/22/21 2159   04/18/21 1300  hydroxychloroquine (PLAQUENIL) tablet 200 mg        200 mg Oral Daily 04/18/21 1144     04/18/21 1115  oseltamivir (TAMIFLU) capsule 75 mg        75 mg Oral  Once 04/18/21 1108 04/18/21 1140         Subjective: Seen and examined.  She states that she feels very weak.  Breathing is improved.  No new complaint.  Objective: Vitals:   04/18/21 1418 04/18/21 1506 04/18/21 1605 04/18/21 2146  BP: 125/74 125/78 (!) 145/69 136/77  Pulse: 78 79 68 73  Resp: 15 15 16 16   Temp:  98 F (36.7 C)  98.4 F (36.9 C)  TempSrc:  Oral  Oral  SpO2: 99% 96% 96% 93%  Weight:      Height:        Intake/Output Summary (Last 24 hours) at 04/19/2021 1122 Last data filed at 04/18/2021 2115 Gross per 24 hour  Intake 1166.25 ml  Output --  Net 1166.25 ml   Filed Weights   04/18/21 0610  Weight: 99.3 kg    Examination:  General exam: Appears calm and comfortable but looks very weak Respiratory system: Diffuse expiratory wheezes bilaterally with diminished breath sounds. Respiratory effort normal. Cardiovascular system: S1 & S2 heard, RRR. No JVD, murmurs, rubs, gallops or clicks. No pedal edema. Gastrointestinal system: Abdomen is nondistended, soft and nontender. No organomegaly or masses felt. Normal bowel sounds heard. Central nervous system: Alert and oriented. No focal neurological deficits. Extremities: Symmetric 5 x 5 power. Skin: No rashes, lesions or ulcers Psychiatry: Judgement and insight appear  normal. Mood & affect appropriate.    Data Reviewed: I have personally reviewed following labs and imaging studies  CBC: Recent Labs  Lab 04/18/21 0646 04/19/21 0152  WBC 4.0 3.0*  NEUTROABS 3.2  --   HGB 13.0 12.5  HCT 40.3 39.0  MCV 93.7 93.5  PLT 130* 517*   Basic Metabolic Panel: Recent Labs  Lab 04/18/21 0646 04/19/21 0152  NA 139 141  K 4.2 4.2  CL 103 108  CO2 25 25  GLUCOSE 126* 86  BUN 34* 35*  CREATININE 1.56* 1.33*  CALCIUM 8.8* 8.6*  MG  --  1.5*   GFR: Estimated Creatinine Clearance: 42 mL/min (A) (by C-G formula based on SCr of 1.33 mg/dL (H)). Liver Function  Tests: Recent Labs  Lab 04/18/21 0646  AST 33  ALT 20  ALKPHOS 81  BILITOT 0.7  PROT 6.7  ALBUMIN 3.5   No results for input(s): LIPASE, AMYLASE in the last 168 hours. No results for input(s): AMMONIA in the last 168 hours. Coagulation Profile: Recent Labs  Lab 04/18/21 0646  INR 1.1   Cardiac Enzymes: No results for input(s): CKTOTAL, CKMB, CKMBINDEX, TROPONINI in the last 168 hours. BNP (last 3 results) No results for input(s): PROBNP in the last 8760 hours. HbA1C: No results for input(s): HGBA1C in the last 72 hours. CBG: No results for input(s): GLUCAP in the last 168 hours. Lipid Profile: No results for input(s): CHOL, HDL, LDLCALC, TRIG, CHOLHDL, LDLDIRECT in the last 72 hours. Thyroid Function Tests: No results for input(s): TSH, T4TOTAL, FREET4, T3FREE, THYROIDAB in the last 72 hours. Anemia Panel: No results for input(s): VITAMINB12, FOLATE, FERRITIN, TIBC, IRON, RETICCTPCT in the last 72 hours. Sepsis Labs: Recent Labs  Lab 04/18/21 0646 04/18/21 0919  LATICACIDVEN 1.7 1.2    Recent Results (from the past 240 hour(s))  Blood Culture (routine x 2)     Status: None (Preliminary result)   Collection Time: 04/18/21  6:46 AM   Specimen: BLOOD  Result Value Ref Range Status   Specimen Description BLOOD RIGHT ANTECUBITAL  Final   Special Requests   Final     BOTTLES DRAWN AEROBIC AND ANAEROBIC Blood Culture results may not be optimal due to an inadequate volume of blood received in culture bottles   Culture   Final    NO GROWTH < 24 HOURS Performed at [facility], [place] [address] [place], [place] [address]    Report Status PENDING  Incomplete  Blood Culture (routine x 2)     Status: None (Preliminary result)   Collection Time: 04/18/21  6:51 AM   Specimen: BLOOD RIGHT FOREARM  Result Value Ref Range Status   Specimen Description BLOOD RIGHT FOREARM  Final   Special Requests   Final    BOTTLES DRAWN AEROBIC AND ANAEROBIC Blood Culture adequate volume   Culture   Final    NO GROWTH < 24 HOURS Performed at [facility], [place] [address] [place], [place] [address]    Report Status PENDING  Incomplete  Resp Panel by RT-PCR (Flu A&B, Covid) Nasopharyngeal Swab     Status: Abnormal   Collection Time: 04/18/21  9:10 AM   Specimen: Nasopharyngeal Swab; Nasopharyngeal(NP) swabs in vial transport medium  Result Value Ref Range Status   SARS Coronavirus 2 by RT PCR NEGATIVE NEGATIVE Final    Comment: (NOTE) SARS-CoV-2 target nucleic acids are NOT DETECTED.  The SARS-CoV-2 RNA is generally detectable in upper respiratory specimens during the acute phase of infection. The lowest concentration of SARS-CoV-2 viral copies this assay can detect is 138 copies/mL. A negative result does not preclude SARS-Cov-2 infection and should not be used as the sole basis for treatment or other patient management decisions. A negative result may occur with  improper specimen collection/handling, submission of specimen other than nasopharyngeal swab, presence of viral mutation(s) within the areas targeted by this assay, and inadequate number of viral copies(<138 copies/mL). A negative result must be combined with clinical observations, patient history, and epidemiological information. The expected result is Negative.  Fact Sheet for Patients:   EntrepreneurPulse.com.au  Fact Sheet for Healthcare Providers:  IncredibleEmployment.be  This test is no t yet approved or cleared by the [place] and  has been authorized for detection and/or diagnosis of SARS-CoV-2 by FDA under an Emergency Use Authorization (EUA). This EUA will remain  in effect (meaning this test can be used) for the duration of the COVID-19 declaration under Section 564(b)(1) of the Act, 21 U.S.C.section 360bbb-3(b)(1), unless the authorization is terminated  or revoked sooner.       Influenza A by PCR POSITIVE (A) NEGATIVE Final   Influenza B by PCR NEGATIVE NEGATIVE Final    Comment: (NOTE) The Xpert Xpress SARS-CoV-2/FLU/RSV plus assay is intended as an aid in the diagnosis of influenza from Nasopharyngeal swab specimens and should not be used as a sole basis for treatment. Nasal washings and aspirates are unacceptable for Xpert Xpress SARS-CoV-2/FLU/RSV testing.  Fact Sheet for Patients: EntrepreneurPulse.com.au  Fact Sheet for Healthcare Providers: IncredibleEmployment.be  This test is not yet approved or cleared by the [place] FDA and has been authorized for detection and/or diagnosis of SARS-CoV-2 by FDA under an Emergency Use Authorization (EUA). This EUA will remain in effect (meaning this test can be used) for the duration of the COVID-19 declaration under Section 564(b)(1) of the Act, 21 U.S.C. section 360bbb-3(b)(1), unless the authorization is terminated or revoked.  Performed at [facility], [place] [address], [place], [place] [address]       Radiology Studies: DG Chest Port 1 View  Result Date: 04/18/2021 CLINICAL DATA:  Questionable sepsis EXAM: PORTABLE CHEST 1 VIEW COMPARISON:  12/17/2017 FINDINGS: Mild cardiomegaly. Stable aortic and hilar contours. Interstitial coarsening at the bases which is unchanged. There is no edema, consolidation,  effusion, or pneumothorax. IMPRESSION: No acute finding when compared to 2018. Electronically Signed   By: Monte Fantasia M.D.   On: 04/18/2021 07:00    Scheduled Meds: . azaTHIOprine  75 mg Oral Daily  . enoxaparin (LOVENOX) injection  40 mg Subcutaneous Q24H  . escitalopram  10 mg Oral Daily  . guaiFENesin  600 mg Oral BID  . hydroxychloroquine  200 mg Oral Daily  . levothyroxine  112 mcg Oral Once per day on Mon Tue Wed Thu Fri Sat  . [START ON 04/21/2021] levothyroxine  56 mcg Oral Once per day on Sun  . oseltamivir  30 mg Oral BID  . polyvinyl alcohol  1-2 drop Both Eyes QID  . sodium chloride flush  3 mL Intravenous Q12H   Continuous Infusions: . sodium chloride 1,000 mL (04/19/21 0759)     LOS: 1 day   Time spent: 34 minutes   Darliss Cheney, MD [facility]  04/19/2021, 11:22 AM   To contact the attending provider between 7A-7P or the covering provider during after hours 7P-7A, please log into the web site [email or web address].

## 2021-04-20 LAB — BASIC METABOLIC PANEL
Anion gap: 9 (ref 5–15)
BUN: 38 mg/dL — ABNORMAL HIGH (ref 8–23)
CO2: 21 mmol/L — ABNORMAL LOW (ref 22–32)
Calcium: 8.1 mg/dL — ABNORMAL LOW (ref 8.9–10.3)
Chloride: 108 mmol/L (ref 98–111)
Creatinine, Ser: 1.21 mg/dL — ABNORMAL HIGH (ref 0.44–1.00)
GFR, Estimated: 46 mL/min — ABNORMAL LOW (ref >60)
Glucose, Bld: 81 mg/dL (ref 70–99)
Potassium: 4.6 mmol/L (ref 3.5–5.1)
Sodium: 138 mmol/L (ref 135–145)

## 2021-04-20 LAB — CBC WITH DIFFERENTIAL/PLATELET
Abs Immature Granulocytes: 0.02 10*3/uL (ref 0.00–0.07)
Basophils Absolute: 0 10*3/uL (ref 0.0–0.1)
Basophils Relative: 0 %
Eosinophils Absolute: 0 10*3/uL (ref 0.0–0.5)
Eosinophils Relative: 0 %
HCT: 34.9 % — ABNORMAL LOW (ref 36.0–46.0)
Hemoglobin: 11.1 g/dL — ABNORMAL LOW (ref 12.0–15.0)
Immature Granulocytes: 1 %
Lymphocytes Relative: 18 %
Lymphs Abs: 0.7 10*3/uL (ref 0.7–4.0)
MCH: 30 pg (ref 26.0–34.0)
MCHC: 31.8 g/dL (ref 30.0–36.0)
MCV: 94.3 fL (ref 80.0–100.0)
Monocytes Absolute: 0.6 10*3/uL (ref 0.1–1.0)
Monocytes Relative: 15 %
Neutro Abs: 2.7 10*3/uL (ref 1.7–7.7)
Neutrophils Relative %: 66 %
Platelets: 104 10*3/uL — ABNORMAL LOW (ref 150–400)
RBC: 3.7 MIL/uL — ABNORMAL LOW (ref 3.87–5.11)
RDW: 12.7 % (ref 11.5–15.5)
WBC: 4 10*3/uL (ref 4.0–10.5)
nRBC: 0 % (ref 0.0–0.2)

## 2021-04-20 LAB — MAGNESIUM: Magnesium: 1.8 mg/dL (ref 1.7–2.4)

## 2021-04-20 MED ORDER — ALBUTEROL SULFATE HFA 108 (90 BASE) MCG/ACT IN AERS
2.0000 | INHALATION_SPRAY | Freq: Four times a day (QID) | RESPIRATORY_TRACT | Status: DC
  Filled 2021-04-20: qty 6.7

## 2021-04-20 MED ORDER — ALBUTEROL SULFATE HFA 108 (90 BASE) MCG/ACT IN AERS
2.0000 | INHALATION_SPRAY | Freq: Four times a day (QID) | RESPIRATORY_TRACT | Status: DC | PRN
  Administered 2021-04-20: 2 via RESPIRATORY_TRACT

## 2021-04-20 MED ORDER — BENZONATATE 100 MG PO CAPS
100.0000 mg | ORAL_CAPSULE | Freq: Three times a day (TID) | ORAL | Status: DC | PRN
  Administered 2021-04-20: 100 mg via ORAL
  Filled 2021-04-20: qty 1

## 2021-04-20 MED ORDER — GUAIFENESIN 100 MG/5ML PO SOLN
5.0000 mL | ORAL | Status: DC | PRN
  Filled 2021-04-20: qty 5

## 2021-04-20 NOTE — Progress Notes (Signed)
Pt c/o a little s.o.b. wanted O2 sats checked-95% on RA. Pt offered albuterol inhaler, which pt accepted. No obvious distress noted. Upper airway congestion audible.

## 2021-04-20 NOTE — Progress Notes (Signed)
PROGRESS NOTE    Hannah Pitts  XHB:[id number] DOB: 1946-10-18 DOA: 04/18/2021 PCP: Tonia Ghent, MD   Brief Narrative: 75 year old female with lupus on immunosuppressive therapy, thrombocytopenia, history of PE status post IVC filter, hypothyroidism, depression, GERD presents with flulike illness x3 to 4 days with headache nonproductive cough, nausea, diarrhea chills fever to 101. In the ED was febrile one 1.4, hypoxic in 87% on room air needing 2 L nasal cannula, patient is admitted with sepsis protocol chest x-ray no acute finding blood cultures and given IV fluids and antibiotics. She was found to have severe sepsis with acute hypoxic respiratory failure due to influenza A infection.  Blood culture unremarkable.  Subjective: Hard of hearing Reports she is feeling some better.  Still needing 2 L nasal cannula oxygen Afebrile overnight.  Blood pressure and vital signs stable About to get up to work with physical therapy  Assessment & Plan:  Severe sepsis with acute hypoxic spectra failure POA due to influenza A infection in the setting of immunosuppressive meds: Influenza A positive likely source, lactic acid reassuring vitals improving has been needing oxygen 2 L nasal cannula.  Wean off oxygen ambulate , continue Tamiflu.  She has mild wheezing we will encourage pulmonary toileting.  CKD stage IIIb at baseline Recent Labs  Lab 04/18/21 0646 04/19/21 0152 04/20/21 0112  BUN 34* 35* 38*  CREATININE 1.56* 1.33* 1.21*   Chronic diastolic CHF appears euvolemic chest x-ray clear last echo with EF 60 to 60% G1 DD in 2018.  Discontinue IV fluids Filed Weights   04/18/21 0610  Weight: 99.3 kg   Lupus on immunosuppressive therapy-Plaquenil and Imuran Leukopenia due to #1.  Resolved Thrombocytopenia: Chronic, slightly downtrending Recent Labs  Lab 04/18/21 0646 04/19/21 0152 04/20/21 0112  PLT 130* 124* 104*   history of PE/DVT status post IVC filter  Hypothyroidism:  Continue levothyroxine  Depression: Continue Lexapro  Morbid obesity with BMI 36.  Will benefit with weight loss and PCP follow-up   Generalized weakness/deconditioning: PT OT evaluation requested today.  Diet Order            Diet regular Room service appropriate? Yes; Fluid consistency: Thin  Diet effective now                Patient's Body mass index is 36.43 kg/m.  DVT prophylaxis: enoxaparin (LOVENOX) injection 40 mg Start: 04/19/21 1300 SCDs Start: 04/18/21 1016 Code Status:   Code Status: Full Code  Family Communication: plan of care discussed with patient at bedside.  Status is: Inpatient Remains inpatient appropriate because:Inpatient level of care appropriate due to severity of illness  Dispo: The patient is from: Home              Anticipated d/c is to: Home.  Getting PT OT consult todaym ambulate              Patient currently is not medically stable to d/c.   Difficult to place patient No Unresulted Labs (From admission, onward)          Start     Ordered   04/18/21 0646  Urinalysis, Routine w reflex microscopic  (Undifferentiated presentation (screening labs and basic nursing orders))  ONCE - STAT,   STAT        04/18/21 0646   04/18/21 0646  Urine culture  (Undifferentiated presentation (screening labs and basic nursing orders))  ONCE - STAT,   STAT        04/18/21 8101  Medications reviewed:  Scheduled Meds: . azaTHIOprine  75 mg Oral Daily  . enoxaparin (LOVENOX) injection  40 mg Subcutaneous Q24H  . escitalopram  10 mg Oral Daily  . guaiFENesin  600 mg Oral BID  . hydroxychloroquine  200 mg Oral Daily  . levothyroxine  112 mcg Oral Once per day on Mon Tue Wed Thu Fri Sat  . [START ON 04/21/2021] levothyroxine  56 mcg Oral Once per day on Sun  . oseltamivir  30 mg Oral BID  . polyvinyl alcohol  1-2 drop Both Eyes QID  . sodium chloride flush  3 mL Intravenous Q12H   Continuous Infusions: . sodium chloride 75 mL/hr at 04/19/21 2105     Consultants:see note  Procedures:see note  Antimicrobials: Anti-infectives (From admission, onward)   Start     Dose/Rate Route Frequency Ordered Stop   04/18/21 2200  oseltamivir (TAMIFLU) capsule 30 mg        30 mg Oral 2 times daily 04/18/21 1136 04/22/21 2159   04/18/21 1300  hydroxychloroquine (PLAQUENIL) tablet 200 mg        200 mg Oral Daily 04/18/21 1144     04/18/21 1115  oseltamivir (TAMIFLU) capsule 75 mg        75 mg Oral  Once 04/18/21 1108 04/18/21 1140     Culture/Microbiology    Component Value Date/Time   SDES BLOOD RIGHT FOREARM 04/18/2021 0651   SPECREQUEST  04/18/2021 0651    BOTTLES DRAWN AEROBIC AND ANAEROBIC Blood Culture adequate volume   CULT  04/18/2021 0651    NO GROWTH < 24 HOURS Performed at [facility], [place] [address], [place], [place] [address]    REPTSTATUS PENDING 04/18/2021 7782    Other culture-see note  Objective: Vitals: Today's Vitals   04/19/21 1522 04/19/21 1930 04/19/21 2138 04/20/21 0545  BP: (!) 115/97  125/89 135/70  Pulse: 74  75 77  Resp: 18  17 19   Temp: 98.5 F (36.9 C)  98 F (36.7 C) 99 F (37.2 C)  TempSrc: Oral  Oral Oral  SpO2: 93%  91% 90%  Weight:      Height:      PainSc:  0-No pain      Intake/Output Summary (Last 24 hours) at 04/20/2021 0753 Last data filed at 04/19/2021 2106 Gross per 24 hour  Intake 1169.25 ml  Output --  Net 1169.25 ml   Filed Weights   04/18/21 0610  Weight: 99.3 kg   Weight change:   Intake/Output from previous day: 04/22 0701 - 04/23 0700 In: 1169.3 [I.V.:1169.3] Out: -  Intake/Output this shift: No intake/output data recorded. Filed Weights   04/18/21 0610  Weight: 99.3 kg    Examination: General exam: AAOx3, obese,NAD, weak appearing. HEENT:Oral mucosa moist, Ear/Nose WNL grossly,dentition normal. Respiratory system: bilaterally air entry present with mild expiratory wheezing, no use of accessory muscle, non tender. Cardiovascular system: S1 & S2 +,  regular, No JVD. Gastrointestinal system: Abdomen soft, full,NT,ND, BS+. Nervous System:Alert, awake, moving extremities and grossly nonfocal Extremities: No edema, distal peripheral pulses palpable.  Skin: No rashes,no icterus. MSK: Normal muscle bulk,tone, power  Data Reviewed: I have personally reviewed following labs and imaging studies CBC: Recent Labs  Lab 04/18/21 0646 04/19/21 0152 04/20/21 0112  WBC 4.0 3.0* 4.0  NEUTROABS 3.2  --  2.7  HGB 13.0 12.5 11.1*  HCT 40.3 39.0 34.9*  MCV 93.7 93.5 94.3  PLT 130* 124* 423*   Basic Metabolic Panel: Recent Labs  Lab 04/18/21 0646 04/19/21 0152 04/20/21 0112  NA 139 141 138  K 4.2 4.2 4.6  CL 103 108 108  CO2 25 25 21*  GLUCOSE 126* 86 81  BUN 34* 35* 38*  CREATININE 1.56* 1.33* 1.21*  CALCIUM 8.8* 8.6* 8.1*  MG  --  1.5* 1.8   GFR: Estimated Creatinine Clearance: 46.1 mL/min (A) (by C-G formula based on SCr of 1.21 mg/dL (H)). Liver Function Tests: Recent Labs  Lab 04/18/21 0646  AST 33  ALT 20  ALKPHOS 81  BILITOT 0.7  PROT 6.7  ALBUMIN 3.5   No results for input(s): LIPASE, AMYLASE in the last 168 hours. No results for input(s): AMMONIA in the last 168 hours. Coagulation Profile: Recent Labs  Lab 04/18/21 0646  INR 1.1   Cardiac Enzymes: No results for input(s): CKTOTAL, CKMB, CKMBINDEX, TROPONINI in the last 168 hours. BNP (last 3 results) No results for input(s): PROBNP in the last 8760 hours. HbA1C: No results for input(s): HGBA1C in the last 72 hours. CBG: No results for input(s): GLUCAP in the last 168 hours. Lipid Profile: No results for input(s): CHOL, HDL, LDLCALC, TRIG, CHOLHDL, LDLDIRECT in the last 72 hours. Thyroid Function Tests: No results for input(s): TSH, T4TOTAL, FREET4, T3FREE, THYROIDAB in the last 72 hours. Anemia Panel: No results for input(s): VITAMINB12, FOLATE, FERRITIN, TIBC, IRON, RETICCTPCT in the last 72 hours. Sepsis Labs: Recent Labs  Lab 04/18/21 0646  04/18/21 0919  LATICACIDVEN 1.7 1.2    Recent Results (from the past 240 hour(s))  Blood Culture (routine x 2)     Status: None (Preliminary result)   Collection Time: 04/18/21  6:46 AM   Specimen: BLOOD  Result Value Ref Range Status   Specimen Description BLOOD RIGHT ANTECUBITAL  Final   Special Requests   Final    BOTTLES DRAWN AEROBIC AND ANAEROBIC Blood Culture results may not be optimal due to an inadequate volume of blood received in culture bottles   Culture   Final    NO GROWTH < 24 HOURS Performed at [facility], [place] [address], [place], [place] [address]    Report Status PENDING  Incomplete  Blood Culture (routine x 2)     Status: None (Preliminary result)   Collection Time: 04/18/21  6:51 AM   Specimen: BLOOD RIGHT FOREARM  Result Value Ref Range Status   Specimen Description BLOOD RIGHT FOREARM  Final   Special Requests   Final    BOTTLES DRAWN AEROBIC AND ANAEROBIC Blood Culture adequate volume   Culture   Final    NO GROWTH < 24 HOURS Performed at [facility], [place] [address], [place], [place] [address]    Report Status PENDING  Incomplete  Resp Panel by RT-PCR (Flu A&B, Covid) Nasopharyngeal Swab     Status: Abnormal   Collection Time: 04/18/21  9:10 AM   Specimen: Nasopharyngeal Swab; Nasopharyngeal(NP) swabs in vial transport medium  Result Value Ref Range Status   SARS Coronavirus 2 by RT PCR NEGATIVE NEGATIVE Final    Comment: (NOTE) SARS-CoV-2 target nucleic acids are NOT DETECTED.  The SARS-CoV-2 RNA is generally detectable in upper respiratory specimens during the acute phase of infection. The lowest concentration of SARS-CoV-2 viral copies this assay can detect is 138 copies/mL. A negative result does not preclude SARS-Cov-2 infection and should not be used as the sole basis for treatment or other patient management decisions. A negative result may occur with  improper specimen collection/handling, submission of  specimen other than  nasopharyngeal swab, presence of viral mutation(s) within the areas targeted by this assay, and inadequate number of viral copies(<138 copies/mL). A negative result must be combined with clinical observations, patient history, and epidemiological information. The expected result is Negative.  Fact Sheet for Patients:  EntrepreneurPulse.com.au  Fact Sheet for Healthcare Providers:  IncredibleEmployment.be  This test is no t yet approved or cleared by the [place] FDA and  has been authorized for detection and/or diagnosis of SARS-CoV-2 by FDA under an Emergency Use Authorization (EUA). This EUA will remain  in effect (meaning this test can be used) for the duration of the COVID-19 declaration under Section 564(b)(1) of the Act, 21 U.S.C.section 360bbb-3(b)(1), unless the authorization is terminated  or revoked sooner.       Influenza A by PCR POSITIVE (A) NEGATIVE Final   Influenza B by PCR NEGATIVE NEGATIVE Final    Comment: (NOTE) The Xpert Xpress SARS-CoV-2/FLU/RSV plus assay is intended as an aid in the diagnosis of influenza from Nasopharyngeal swab specimens and should not be used as a sole basis for treatment. Nasal washings and aspirates are unacceptable for Xpert Xpress SARS-CoV-2/FLU/RSV testing.  Fact Sheet for Patients: EntrepreneurPulse.com.au  Fact Sheet for Healthcare Providers: IncredibleEmployment.be  This test is not yet approved or cleared by the [place] FDA and has been authorized for detection and/or diagnosis of SARS-CoV-2 by FDA under an Emergency Use Authorization (EUA). This EUA will remain in effect (meaning this test can be used) for the duration of the COVID-19 declaration under Section 564(b)(1) of the Act, 21 U.S.C. section 360bbb-3(b)(1), unless the authorization is terminated or revoked.  Performed at [facility], [place] [address], [place],  [place] [address]      Radiology Studies: No results found.   LOS: 2 days   Antonieta Pert, MD [facility]  04/20/2021, 7:53 AM

## 2021-04-20 NOTE — Progress Notes (Signed)
Occupational Therapy Evaluation Patient Details Name: Hannah Pitts MRN: [id number] DOB: 03/29/1946 Today's Date: 04/20/2021    History of Present Illness Pt is a 75 y.o. F who presents with severe sepsis with acute hypoxic respiratory failure due to influenza A infection. Significant PMH: lupus on immunosuppressive therapy, thrombocytopenia, PE s/p IVC filter.   Clinical Impression   Pt pleasant and agreeable to session, presenting at this time with generalized weakness and fatigue, mild balance deficits but appears to otherwise be grossly at baseline for functional transfers, in room mobility and basic ADL's tasks. Pt lives at home with dtr, grand daughter and additional family. Generally completing basic ADL's at mod indep level, and using SPC or rollator as needed for mobility. No recent falls. Pt currently demo's need for intermittent rest breaks to address SOB, good recovery of O2 with mobility dropping to 86% improving to >92% <1 minute with seated rest. At this time, there is no indication for continued acute skilled OT services, with rec for 24hr S from family initially with d/c to home, and no follow up OT recommended at this time.     Follow Up Recommendations  No OT follow up    Equipment Recommendations  None recommended by OT    Recommendations for Other Services       Precautions / Restrictions Precautions Precautions: Other (comment) Precaution Comments: watch O2 Restrictions Weight Bearing Restrictions: No      Mobility Bed Mobility Overal bed mobility: Independent                  Transfers Overall transfer level: Modified independent Equipment used: Rolling walker (2 wheeled)                  Balance Overall balance assessment: Mild deficits observed, not formally tested                                         ADL either performed or assessed with clinical judgement   ADL Overall ADL's : Modified independent                                        General ADL Comments: pt limited primarily by fatigue, demo's good safety and technique for transfers and ADL's in both sitting and standing     Vision Baseline Vision/History: Wears glasses Wears Glasses: At all times Vision Assessment?: No apparent visual deficits     Perception     Praxis      Pertinent Vitals/Pain Pain Assessment: No/denies pain     Hand Dominance Right   Extremity/Trunk Assessment Upper Extremity Assessment Upper Extremity Assessment: Overall WFL for tasks assessed   Lower Extremity Assessment Lower Extremity Assessment: Generalized weakness       Communication Communication Communication: HOH   Cognition Arousal/Alertness: Awake/alert Behavior During Therapy: WFL for tasks assessed/performed Overall Cognitive Status: Within Functional Limits for tasks assessed                                     General Comments  good understanding of ECT's during ADL tasks.    Exercises     Shoulder Instructions      Home Living Family/patient expects to be discharged to:: Private  residence Living Arrangements: Children Available Help at Discharge: Family Type of Home: House Home Access: Ramped entrance     Home Layout: One level     Bathroom Shower/Tub: Chief Strategy Officer: Standard Bathroom Accessibility: Yes How Accessible: Accessible via walker Home Equipment: Walker - 4 wheels;Cane - single point;Bedside commode;Tub bench          Prior Functioning/Environment Level of Independence: Independent with assistive device(s)        Comments: can for indoor mobility, rollator for out of home mobility. limited tolerance at baseline        OT Problem List: Decreased strength;Decreased activity tolerance      OT Treatment/Interventions:      OT Goals(Current goals can be found in the care plan section) Acute Rehab OT Goals Patient Stated Goal: to go home today   OT Frequency:     Barriers to D/C:            Co-evaluation              AM-PAC OT "6 Clicks" Daily Activity     Outcome Measure Help from another person eating meals?: None Help from another person taking care of personal grooming?: None Help from another person toileting, which includes using toliet, bedpan, or urinal?: None Help from another person bathing (including washing, rinsing, drying)?: A Little Help from another person to put on and taking off regular upper body clothing?: None Help from another person to put on and taking off regular lower body clothing?: None 6 Click Score: 23   End of Session Equipment Utilized During Treatment: Rolling walker  Activity Tolerance: Patient tolerated treatment well Patient left: in chair;with call bell/phone within reach  OT Visit Diagnosis: Muscle weakness (generalized) (M62.81)                Time: 1610-9604 OT Time Calculation (min): 15 min Charges:  OT General Charges $OT Visit: 1 Visit OT Evaluation $OT Eval Low Complexity: 1 Low  Cortasia Screws OTR/L acute rehab services Office: [phone]  04/20/2021, 11:46 AM

## 2021-04-20 NOTE — Evaluation (Signed)
Physical Therapy Evaluation Patient Details Name: Hannah Pitts MRN: [id number] DOB: 29-Sep-1946 Today's Date: 04/20/2021   History of Present Illness  Pt is a 75 y.o. F who presents with severe sepsis with acute hypoxic respiratory failure due to influenza A infection. Significant PMH: lupus on immunosuppressive therapy, thrombocytopenia, PE s/p IVC filter.  Clinical Impression  Prior to admission, pt lives with her daughter and is a limited community ambulator using a cane vs Rollator. Pt presents with decreased cardiopulmonary endurance. Ambulating x 200 feet with a walker without physical difficulty; desaturation to 88% on RA during mobility, but rebounded to 96% at rest. Encouraged sitting up in chair, education also provided regarding energy conservation techniques and activity recommendations. Do not anticipate need for PT follow up. Will follow acutely to promote mobility.     Follow Up Recommendations No PT follow up;Supervision - Intermittent    Equipment Recommendations  None recommended by PT    Recommendations for Other Services       Precautions / Restrictions Precautions Precautions: Other (comment) Precaution Comments: watch O2 Restrictions Weight Bearing Restrictions: No      Mobility  Bed Mobility Overal bed mobility: Independent                  Transfers Overall transfer level: Modified independent Equipment used: Rolling walker (2 wheeled)                Ambulation/Gait Ambulation/Gait assistance: Supervision Gait Distance (Feet): 200 Feet Assistive device: Rolling walker (2 wheeled) Gait Pattern/deviations: Step-through pattern;Decreased stride length Gait velocity: decreased   General Gait Details: Slow and steady pace. No gross instability  Stairs            Wheelchair Mobility    Modified Rankin (Stroke Patients Only)       Balance Overall balance assessment: Mild deficits observed, not formally tested                                            Pertinent Vitals/Pain Pain Assessment: No/denies pain    Home Living Family/patient expects to be discharged to:: Private residence Living Arrangements: Children (daughter) Available Help at Discharge: Family Type of Home: House Home Access: Ramped entrance     Home Layout: One level Home Equipment: Environmental consultant - 4 wheels;Cane - single point;Bedside commode;Tub bench      Prior Function Level of Independence: Independent with assistive device(s)         Comments: Uses cane vs Rollator. Limited community ambulator. Enjoys puzzles, playing with great grandchild     Hand Dominance        Extremity/Trunk Assessment   Upper Extremity Assessment Upper Extremity Assessment: Defer to OT evaluation    Lower Extremity Assessment Lower Extremity Assessment: Generalized weakness       Communication   Communication: HOH (hears better out of L ear)  Cognition Arousal/Alertness: Awake/alert Behavior During Therapy: WFL for tasks assessed/performed Overall Cognitive Status: Within Functional Limits for tasks assessed                                        General Comments      Exercises     Assessment/Plan    PT Assessment Patient needs continued PT services  PT Problem List Decreased strength;Decreased activity tolerance;Decreased balance;Decreased  mobility;Cardiopulmonary status limiting activity       PT Treatment Interventions DME instruction;Gait training;Functional mobility training;Therapeutic activities;Therapeutic exercise;Balance training;Patient/family education    PT Goals (Current goals can be found in the Care Plan section)  Acute Rehab PT Goals Patient Stated Goal: return to baseline PT Goal Formulation: All assessment and education complete, DC therapy Time For Goal Achievement: 05/04/21 Potential to Achieve Goals: Good    Frequency Min 3X/week   Barriers to discharge         Co-evaluation               AM-PAC PT "6 Clicks" Mobility  Outcome Measure Help needed turning from your back to your side while in a flat bed without using bedrails?: None Help needed moving from lying on your back to sitting on the side of a flat bed without using bedrails?: None Help needed moving to and from a bed to a chair (including a wheelchair)?: None Help needed standing up from a chair using your arms (e.g., wheelchair or bedside chair)?: None Help needed to walk in hospital room?: A Little Help needed climbing 3-5 steps with a railing? : A Little 6 Click Score: 22    End of Session   Activity Tolerance: Patient tolerated treatment well Patient left: in chair;with call bell/phone within reach Nurse Communication: Mobility status PT Visit Diagnosis: Unsteadiness on feet (R26.81);Difficulty in walking, not elsewhere classified (R26.2)    Time: 7867-6720 PT Time Calculation (min) (ACUTE ONLY): 20 min   Charges:   PT Evaluation $PT Eval Moderate Complexity: 1 Mod          Hannah Pitts, PT, DPT Acute Rehabilitation Services Pager [phone] Office [phone]   Hannah Pitts 04/20/2021, 8:43 AM

## 2021-04-21 LAB — CBC
HCT: 33.9 % — ABNORMAL LOW (ref 36.0–46.0)
Hemoglobin: 11.1 g/dL — ABNORMAL LOW (ref 12.0–15.0)
MCH: 30.2 pg (ref 26.0–34.0)
MCHC: 32.7 g/dL (ref 30.0–36.0)
MCV: 92.4 fL (ref 80.0–100.0)
Platelets: 111 10*3/uL — ABNORMAL LOW (ref 150–400)
RBC: 3.67 MIL/uL — ABNORMAL LOW (ref 3.87–5.11)
RDW: 12.7 % (ref 11.5–15.5)
WBC: 2.9 10*3/uL — ABNORMAL LOW (ref 4.0–10.5)
nRBC: 0 % (ref 0.0–0.2)

## 2021-04-21 LAB — BASIC METABOLIC PANEL
Anion gap: 7 (ref 5–15)
BUN: 32 mg/dL — ABNORMAL HIGH (ref 8–23)
CO2: 24 mmol/L (ref 22–32)
Calcium: 8.5 mg/dL — ABNORMAL LOW (ref 8.9–10.3)
Chloride: 110 mmol/L (ref 98–111)
Creatinine, Ser: 1.18 mg/dL — ABNORMAL HIGH (ref 0.44–1.00)
GFR, Estimated: 48 mL/min — ABNORMAL LOW (ref >60)
Glucose, Bld: 90 mg/dL (ref 70–99)
Potassium: 4 mmol/L (ref 3.5–5.1)
Sodium: 141 mmol/L (ref 135–145)

## 2021-04-21 MED ORDER — OSELTAMIVIR PHOSPHATE 30 MG PO CAPS: 30.0000 mg | ORAL_CAPSULE | Freq: Two times a day (BID) | ORAL | 0 refills | Status: AC

## 2021-04-21 MED ORDER — GUAIFENESIN ER 600 MG PO TB12: 600.0000 mg | ORAL_TABLET | Freq: Two times a day (BID) | ORAL | 0 refills | Status: AC

## 2021-04-21 MED ORDER — PREDNISONE 20 MG PO TABS
20.0000 mg | ORAL_TABLET | Freq: Every day | ORAL | Status: DC
  Administered 2021-04-21: 20 mg via ORAL
  Filled 2021-04-21: qty 1

## 2021-04-21 MED ORDER — BENZONATATE 100 MG PO CAPS: 100.0000 mg | ORAL_CAPSULE | Freq: Three times a day (TID) | ORAL | 0 refills | Status: DC | PRN

## 2021-04-21 MED ORDER — PREDNISONE 10 MG PO TABS: 10.0000 mg | ORAL_TABLET | Freq: Every day | ORAL | 0 refills | Status: AC

## 2021-04-21 NOTE — Discharge Summary (Signed)
Physician Discharge Summary  Hannah Pitts [id number] DOB: Apr 23, 1946 DOA: 04/18/2021  PCP: Tonia Ghent, MD  Admit date: 04/18/2021 Discharge date: 04/21/2021  Admitted From: home Disposition:  home  Recommendations for Outpatient Follow-up:  1. Follow up with PCP in 1-2 weeks 2. Please obtain BMP/CBC in one week 3. Please follow up on the following pending results:  Home Health:no  Equipment/Devices: none  Discharge Condition: Stable Code Status:   Code Status: Full Code Diet recommendation:  Diet Order            Diet - low sodium heart healthy           Diet regular Room service appropriate? Yes; Fluid consistency: Thin  Diet effective now                  Brief/Interim Summary:75 year old female with lupus on immunosuppressive therapy, thrombocytopenia, history of PE status post IVC filter, hypothyroidism, depression, GERD presents with flulike illness x3 to 4 days with headache nonproductive cough, nausea, diarrhea chills fever to 101. In the ED was febrile one 1.4, hypoxic in 87% on room air needing 2 L nasal cannula, patient is admitted with sepsis protocol chest x-ray no acute finding blood cultures and given IV fluids and antibiotics. She was found to have severe sepsis with acute hypoxic respiratory failure due to influenza A infection. Blood culture unremarkable.  Respiratory status improved nicely feeling much better able to ambulate well. Having some cough which is mild wheezing noticed today short course a steroid with plan to quickly taper.  Her oxygen will be checked again with ambulation if she does well will be discharged home today. She was doing well on RA but needed 2l [place] on ambulation and did well and feels comfortable going home.  Discharge Diagnoses:  Severe sepsis with acute hypoxic respiratory failure POA due to influenza A infection in the setting of immunosuppressive meds: Influenza A positive likely source, lactic acid reassuring vitals  stable able to come off oxygen.Continue Tamiflu, will place on low-dose steroids short course and ambulate for discharge home. She  Is needing 2l [place] on ambulation and doing well at rest.  CKD stage IIIb at baseline Recent Labs  Lab 04/18/21 0646 04/19/21 0152 04/20/21 0112 04/21/21 0230  BUN 34* 35* 38* 32*  CREATININE 1.56* 1.33* 1.21* 1.18*   Chronic diastolic CHF appears euvolemic chest x-ray clear last echo with EF 60 to 60% G1 DD in 2018.  Discontinue IV fluids Filed Weights   04/18/21 0610  Weight: 99.3 kg   Lupus on immunosuppressive therapy-Plaquenil and Imuran Leukopenia due to #1.  Resolved Thrombocytopenia: Chronic, improving, 104k> 111. Recent Labs  Lab 04/18/21 0646 04/19/21 0152 04/20/21 0112 04/21/21 0230  PLT 130* 124* 104* 111*   history of PE/DVT status post IVC filter  Hypothyroidism: Continue levothyroxine  Depression: Continue Lexapro  Morbid obesity with BMI 36.  Will benefit with weight loss and PCP follow-up   Generalized weakness/deconditioning: PT OT evaluation completed.  No further need Plan of care discussed with patient and her daughter over the phone  Consults:  none  Subjective: Alert oriented not in distress, shortness of breath improved, on room air. Discharge Exam: Vitals:   04/20/21 1948 04/21/21 0523  BP: 122/68 (!) 141/63  Pulse: 73 64  Resp: 18   Temp: 98.2 F (36.8 C) 98.1 F (36.7 C)  SpO2: 95% 99%   General: Pt is alert, awake, not in acute distress Cardiovascular: RRR, S1/S2 +, no rubs, no gallops  Respiratory: CTA bilaterally, no wheezing, no rhonchi Abdominal: Soft, NT, ND, bowel sounds + Extremities: no edema, no cyanosis  Discharge Instructions  Discharge Instructions    Diet - low sodium heart healthy   Complete by: As directed    Discharge instructions   Complete by: As directed    Please call call MD or return to ER for similar or worsening recurring problem that brought you to hospital or if any  fever,nausea/vomiting,abdominal pain, uncontrolled pain, chest pain,  shortness of breath or any other alarming symptoms.  Please follow-up your doctor as instructed in a week time and call the office for appointment.  Please avoid alcohol, smoking, or any other illicit substance and maintain healthy habits including taking your regular medications as prescribed.  You were cared for by a hospitalist during your hospital stay. If you have any questions about your discharge medications or the care you received while you were in the hospital after you are discharged, you can call the unit and ask to speak with the hospitalist on call if the hospitalist that took care of you is not available.  Once you are discharged, your primary care physician will handle any further medical issues. Please note that NO REFILLS for any discharge medications will be authorized once you are discharged, as it is imperative that you return to your primary care physician (or establish a relationship with a primary care physician if you do not have one) for your aftercare needs so that they can reassess your need for medications and monitor your lab values   Increase activity slowly   Complete by: As directed      Allergies as of 04/21/2021      Reactions   Heparin Other (See Comments)   Bruising/bleeding      Medication List    TAKE these medications   acetaminophen 500 MG tablet Commonly known as: TYLENOL Take 500 mg by mouth every 6 (six) hours as needed for mild pain (or headaches).   azaTHIOprine 50 MG tablet Commonly known as: IMURAN Take 75 mg by mouth daily.   benzonatate 100 MG capsule Commonly known as: TESSALON Take 1 capsule (100 mg total) by mouth 3 (three) times daily as needed for cough.   escitalopram 10 MG tablet Commonly known as: LEXAPRO TAKE 1 TABLET BY MOUTH EVERY DAY   guaiFENesin 600 MG 12 hr tablet Commonly known as: MUCINEX Take 1 tablet (600 mg total) by mouth 2 (two) times  daily for 7 days.   hydroxychloroquine 200 MG tablet Commonly known as: PLAQUENIL Take 1 tablet (200 mg total) by mouth daily.   levothyroxine 112 MCG tablet Commonly known as: SYNTHROID TAKE 1 TABLET BY MOUTH EVERY DAY EXCEPT ON SUNDAY ONLY TAKE 1/2 TABLET What changed: See the new instructions.   loperamide 2 MG tablet Commonly known as: IMODIUM A-D Take 2 mg by mouth 4 (four) times daily as needed for diarrhea or loose stools.   nystatin powder Commonly known as: MYCOSTATIN/NYSTOP Apply topically 2 (two) times daily as needed. What changed:   how much to take  reasons to take this   oseltamivir 30 MG capsule Commonly known as: TAMIFLU Take 1 capsule (30 mg total) by mouth 2 (two) times daily for 2 days.   predniSONE 10 MG tablet Commonly known as: DELTASONE Take 1 tablet (10 mg total) by mouth daily with breakfast for 3 days. Start taking on: April 22, 2021   Systane Preservative Free 0.4-0.3 % Soln Generic drug: Polyethyl Glyc-Propyl  Glyc PF Place 1-2 drops into both eyes 4 (four) times daily. Place 1-2 drops into both eyes four times a day due to Sjgren's syndrome   Vitamin D3 25 MCG (1000 UT) Caps Take 1,000 Units by mouth daily with breakfast.            Durable Medical Equipment  (From admission, onward)         Start     Ordered   04/21/21 1400  For home use only DME oxygen  Once       Comments: 2l [place] on ambulation  Question Answer Comment  Length of Need Lifetime   Mode or (Route) Nasal cannula   Liters per Minute 2   Frequency Continuous (stationary and portable oxygen unit needed)   Oxygen delivery system Gas      04/21/21 1359          Follow-up Information    Tonia Ghent, MD Follow up in 1 week(s).   Specialty: Family Medicine Contact information: [place] [phone]              Allergies  Allergen Reactions  . Heparin Other (See Comments)    Bruising/bleeding    The results  of significant diagnostics from this hospitalization (including imaging, microbiology, ancillary and laboratory) are listed below for reference.    Microbiology: Recent Results (from the past 240 hour(s))  Blood Culture (routine x 2)     Status: None (Preliminary result)   Collection Time: 04/18/21  6:46 AM   Specimen: BLOOD  Result Value Ref Range Status   Specimen Description BLOOD RIGHT ANTECUBITAL  Final   Special Requests   Final    BOTTLES DRAWN AEROBIC AND ANAEROBIC Blood Culture results may not be optimal due to an inadequate volume of blood received in culture bottles   Culture   Final    NO GROWTH 3 DAYS Performed at [facility], [place] [address], [place], [place] [address]    Report Status PENDING  Incomplete  Blood Culture (routine x 2)     Status: None (Preliminary result)   Collection Time: 04/18/21  6:51 AM   Specimen: BLOOD RIGHT FOREARM  Result Value Ref Range Status   Specimen Description BLOOD RIGHT FOREARM  Final   Special Requests   Final    BOTTLES DRAWN AEROBIC AND ANAEROBIC Blood Culture adequate volume   Culture   Final    NO GROWTH 3 DAYS Performed at [facility], [place] [address] [place], [place] [address]    Report Status PENDING  Incomplete  Resp Panel by RT-PCR (Flu A&B, Covid) Nasopharyngeal Swab     Status: Abnormal   Collection Time: 04/18/21  9:10 AM   Specimen: Nasopharyngeal Swab; Nasopharyngeal(NP) swabs in vial transport medium  Result Value Ref Range Status   SARS Coronavirus 2 by RT PCR NEGATIVE NEGATIVE Final    Comment: (NOTE) SARS-CoV-2 target nucleic acids are NOT DETECTED.  The SARS-CoV-2 RNA is generally detectable in upper respiratory specimens during the acute phase of infection. The lowest concentration of SARS-CoV-2 viral copies this assay can detect is 138 copies/mL. A negative result does not preclude SARS-Cov-2 infection and should not be used as the sole basis for treatment or other patient management decisions.  A negative result may occur with  improper specimen collection/handling, submission of specimen other than nasopharyngeal swab, presence of viral mutation(s) within the areas targeted by this assay, and inadequate number of  viral copies(<138 copies/mL). A negative result must be combined with clinical observations, patient history, and epidemiological information. The expected result is Negative.  Fact Sheet for Patients:  EntrepreneurPulse.com.au  Fact Sheet for Healthcare Providers:  IncredibleEmployment.be  This test is no t yet approved or cleared by the [place] FDA and  has been authorized for detection and/or diagnosis of SARS-CoV-2 by FDA under an Emergency Use Authorization (EUA). This EUA will remain  in effect (meaning this test can be used) for the duration of the COVID-19 declaration under Section 564(b)(1) of the Act, 21 U.S.C.section 360bbb-3(b)(1), unless the authorization is terminated  or revoked sooner.       Influenza A by PCR POSITIVE (A) NEGATIVE Final   Influenza B by PCR NEGATIVE NEGATIVE Final    Comment: (NOTE) The Xpert Xpress SARS-CoV-2/FLU/RSV plus assay is intended as an aid in the diagnosis of influenza from Nasopharyngeal swab specimens and should not be used as a sole basis for treatment. Nasal washings and aspirates are unacceptable for Xpert Xpress SARS-CoV-2/FLU/RSV testing.  Fact Sheet for Patients: EntrepreneurPulse.com.au  Fact Sheet for Healthcare Providers: IncredibleEmployment.be  This test is not yet approved or cleared by the [place] FDA and has been authorized for detection and/or diagnosis of SARS-CoV-2 by FDA under an Emergency Use Authorization (EUA). This EUA will remain in effect (meaning this test can be used) for the duration of the COVID-19 declaration under Section 564(b)(1) of the Act, 21 U.S.C. section 360bbb-3(b)(1), unless the  authorization is terminated or revoked.  Performed at [facility], [place] [address], [place], [place] [address]     Procedures/Studies: DG Chest Port 1 View  Result Date: 04/18/2021 CLINICAL DATA:  Questionable sepsis EXAM: PORTABLE CHEST 1 VIEW COMPARISON:  12/17/2017 FINDINGS: Mild cardiomegaly. Stable aortic and hilar contours. Interstitial coarsening at the bases which is unchanged. There is no edema, consolidation, effusion, or pneumothorax. IMPRESSION: No acute finding when compared to 2018. Electronically Signed   By: Monte Fantasia M.D.   On: 04/18/2021 07:00    Labs: BNP (last 3 results) No results for input(s): BNP in the last 8760 hours. Basic Metabolic Panel: Recent Labs  Lab 04/18/21 0646 04/19/21 0152 04/20/21 0112 04/21/21 0230  NA 139 141 138 141  K 4.2 4.2 4.6 4.0  CL 103 108 108 110  CO2 25 25 21* 24  GLUCOSE 126* 86 81 90  BUN 34* 35* 38* 32*  CREATININE 1.56* 1.33* 1.21* 1.18*  CALCIUM 8.8* 8.6* 8.1* 8.5*  MG  --  1.5* 1.8  --    Liver Function Tests: Recent Labs  Lab 04/18/21 0646  AST 33  ALT 20  ALKPHOS 81  BILITOT 0.7  PROT 6.7  ALBUMIN 3.5   No results for input(s): LIPASE, AMYLASE in the last 168 hours. No results for input(s): AMMONIA in the last 168 hours. CBC: Recent Labs  Lab 04/18/21 0646 04/19/21 0152 04/20/21 0112 04/21/21 0230  WBC 4.0 3.0* 4.0 2.9*  NEUTROABS 3.2  --  2.7  --   HGB 13.0 12.5 11.1* 11.1*  HCT 40.3 39.0 34.9* 33.9*  MCV 93.7 93.5 94.3 92.4  PLT 130* 124* 104* 111*   Cardiac Enzymes: No results for input(s): CKTOTAL, CKMB, CKMBINDEX, TROPONINI in the last 168 hours. BNP: Invalid input(s): POCBNP CBG: No results for input(s): GLUCAP in the last 168 hours. D-Dimer No results for input(s): DDIMER in the last 72 hours. Hgb A1c No results for input(s): HGBA1C in the last 72 hours. Lipid  Profile No results for input(s): CHOL, HDL, LDLCALC, TRIG, CHOLHDL, LDLDIRECT in the last 72 hours. Thyroid  function studies No results for input(s): TSH, T4TOTAL, T3FREE, THYROIDAB in the last 72 hours.  Invalid input(s): FREET3 Anemia work up No results for input(s): VITAMINB12, FOLATE, FERRITIN, TIBC, IRON, RETICCTPCT in the last 72 hours. Urinalysis    Component Value Date/Time   COLORURINE YELLOW 12/14/2017 1406   APPEARANCEUR CLEAR 12/14/2017 1406   LABSPEC 1.014 12/14/2017 1406   PHURINE 5.0 12/14/2017 1406   GLUCOSEU NEGATIVE 12/14/2017 1406   HGBUR NEGATIVE 12/14/2017 1406   HGBUR negative 12/27/2010 0801   BILIRUBINUR NEGATIVE 12/14/2017 1406   BILIRUBINUR Neg 05/20/2013 1010   KETONESUR NEGATIVE 12/14/2017 1406   PROTEINUR NEGATIVE 12/14/2017 1406   UROBILINOGEN negative 05/20/2013 1010   UROBILINOGEN 0.2 12/27/2010 0801   NITRITE NEGATIVE 12/14/2017 1406   LEUKOCYTESUR NEGATIVE 12/14/2017 1406   Sepsis Labs Invalid input(s): PROCALCITONIN,  WBC,  LACTICIDVEN Microbiology Recent Results (from the past 240 hour(s))  Blood Culture (routine x 2)     Status: None (Preliminary result)   Collection Time: 04/18/21  6:46 AM   Specimen: BLOOD  Result Value Ref Range Status   Specimen Description BLOOD RIGHT ANTECUBITAL  Final   Special Requests   Final    BOTTLES DRAWN AEROBIC AND ANAEROBIC Blood Culture results may not be optimal due to an inadequate volume of blood received in culture bottles   Culture   Final    NO GROWTH 3 DAYS Performed at [facility], [place] [address] [place], [place] [address]    Report Status PENDING  Incomplete  Blood Culture (routine x 2)     Status: None (Preliminary result)   Collection Time: 04/18/21  6:51 AM   Specimen: BLOOD RIGHT FOREARM  Result Value Ref Range Status   Specimen Description BLOOD RIGHT FOREARM  Final   Special Requests   Final    BOTTLES DRAWN AEROBIC AND ANAEROBIC Blood Culture adequate volume   Culture   Final    NO GROWTH 3 DAYS Performed at [facility], [place] [address], [place], [place] [address]     Report Status PENDING  Incomplete  Resp Panel by RT-PCR (Flu A&B, Covid) Nasopharyngeal Swab     Status: Abnormal   Collection Time: 04/18/21  9:10 AM   Specimen: Nasopharyngeal Swab; Nasopharyngeal(NP) swabs in vial transport medium  Result Value Ref Range Status   SARS Coronavirus 2 by RT PCR NEGATIVE NEGATIVE Final    Comment: (NOTE) SARS-CoV-2 target nucleic acids are NOT DETECTED.  The SARS-CoV-2 RNA is generally detectable in upper respiratory specimens during the acute phase of infection. The lowest concentration of SARS-CoV-2 viral copies this assay can detect is 138 copies/mL. A negative result does not preclude SARS-Cov-2 infection and should not be used as the sole basis for treatment or other patient management decisions. A negative result may occur with  improper specimen collection/handling, submission of specimen other than nasopharyngeal swab, presence of viral mutation(s) within the areas targeted by this assay, and inadequate number of viral copies(<138 copies/mL). A negative result must be combined with clinical observations, patient history, and epidemiological information. The expected result is Negative.  Fact Sheet for Patients:  EntrepreneurPulse.com.au  Fact Sheet for Healthcare Providers:  IncredibleEmployment.be  This test is no t yet approved or cleared by the [place] FDA and  has been authorized for detection and/or diagnosis of SARS-CoV-2 by FDA under an Emergency Use Authorization (EUA). This EUA  will remain  in effect (meaning this test can be used) for the duration of the COVID-19 declaration under Section 564(b)(1) of the Act, 21 U.S.C.section 360bbb-3(b)(1), unless the authorization is terminated  or revoked sooner.       Influenza A by PCR POSITIVE (A) NEGATIVE Final   Influenza B by PCR NEGATIVE NEGATIVE Final    Comment: (NOTE) The Xpert Xpress SARS-CoV-2/FLU/RSV plus assay is intended as an  aid in the diagnosis of influenza from Nasopharyngeal swab specimens and should not be used as a sole basis for treatment. Nasal washings and aspirates are unacceptable for Xpert Xpress SARS-CoV-2/FLU/RSV testing.  Fact Sheet for Patients: EntrepreneurPulse.com.au  Fact Sheet for Healthcare Providers: IncredibleEmployment.be  This test is not yet approved or cleared by the [place] FDA and has been authorized for detection and/or diagnosis of SARS-CoV-2 by FDA under an Emergency Use Authorization (EUA). This EUA will remain in effect (meaning this test can be used) for the duration of the COVID-19 declaration under Section 564(b)(1) of the Act, 21 U.S.C. section 360bbb-3(b)(1), unless the authorization is terminated or revoked.  Performed at [facility], [place] [address] [place], [place] [address]      Time coordinating discharge: 25 minutes  SIGNED: Antonieta Pert, MD  [facility] 04/21/2021, 2:01 PM  If 7PM-7AM, please contact night-coverage [email or web address]

## 2021-04-21 NOTE — Progress Notes (Addendum)
SATURATION QUALIFICATIONS: (This note is used to comply with regulatory documentation for home oxygen)  Patient Saturations on Room Air at Rest =95 %  Patient Saturations on Room Air while Ambulating = 84-86%  Patient Saturations on 2 Liters of oxygen while Ambulating =96 %  Please briefly explain why patient needs home oxygen: Pt oxygen sats drops while ambulating

## 2021-04-21 NOTE — TOC Progression Note (Signed)
Transition of Care [facility]) - Progression Note    Patient Details  Name: Hannah Pitts MRN: [id number] Date of Birth: Aug 08, 1946  Transition [facility]) CM/SW [place], RN Phone Number: [phone]  04/21/2021, 2:11 PM  Clinical Narrative:    [facility] consulted for patient discharging home with home O2 needs. CM at bedside to discuss home DME with patient. Patient is agreeable and understands. Home O2 has been set up with [employer]. Patient made aware that portable tanks will be delivered to the room and concentrator will be delivered to the home. No other needs noted at this time. TOC will sign off.         Expected Discharge Plan and Services           Expected Discharge Date: 04/21/21                                     Social Determinants of Health (SDOH) Interventions    Readmission Risk Interventions No flowsheet data found.

## 2021-04-21 NOTE — Progress Notes (Signed)
Pt ordered to be d/c by provider At this time pt is alert and oriented, no s/s of distress Pt has received discharge instructions and has had pt education. Pt acknowledges understanding Pt will d/c with oxygen

## 2021-04-22 ENCOUNTER — Telehealth: Payer: Self-pay

## 2021-04-22 DIAGNOSIS — J9601 Acute respiratory failure with hypoxia: Secondary | ICD-10-CM | POA: Diagnosis not present

## 2021-04-22 NOTE — Telephone Encounter (Signed)
Transition Care Management Unsuccessful Follow-up Telephone Call  Date of discharge and from where:  [facility]/21/2021[facility]   Attempts:  1st Attempt  Reason for unsuccessful TCM follow-up call:  Left voice message

## 2021-04-22 NOTE — Telephone Encounter (Signed)
Transition Care Management Unsuccessful Follow-up Telephone Call  Date of discharge and from where:  [facility]/21/2021[facility]  Attempts:  2nd Attempt  Reason for unsuccessful TCM follow-up call:  No answer/busy

## 2021-04-23 LAB — CULTURE, BLOOD (ROUTINE X 2)
Culture: NO GROWTH
Culture: NO GROWTH
Special Requests: ADEQUATE

## 2021-04-23 NOTE — Telephone Encounter (Signed)
Transition Care Management Unsuccessful Follow-up Telephone Call  Date of discharge and from where:  04/21/2021[facility]  Attempts:  3rd Attempt  Reason for unsuccessful TCM follow-up call:  Left voice message

## 2021-04-30 ENCOUNTER — Telehealth (INDEPENDENT_AMBULATORY_CARE_PROVIDER_SITE_OTHER): Payer: Medicare Other | Admitting: Family Medicine

## 2021-04-30 ENCOUNTER — Encounter: Payer: Self-pay | Admitting: Family Medicine

## 2021-04-30 ENCOUNTER — Other Ambulatory Visit: Payer: Self-pay

## 2021-04-30 VITALS — BP 129/82 | HR 77 | Temp 96.5°F | Ht 65.0 in | Wt 219.0 lb

## 2021-04-30 DIAGNOSIS — J101 Influenza due to other identified influenza virus with other respiratory manifestations: Secondary | ICD-10-CM

## 2021-04-30 DIAGNOSIS — J111 Influenza due to unidentified influenza virus with other respiratory manifestations: Secondary | ICD-10-CM | POA: Diagnosis not present

## 2021-04-30 NOTE — Progress Notes (Signed)
Interactive audio and video telecommunications were attempted between this provider and patient, however failed, due to patient having technical difficulties OR patient did not have access to video capability.  We continued and completed visit with audio only.   Virtual Visit via Telephone Note  I connected with patient on 04/30/21  at 2:42 PM  by telephone and verified that I am speaking with the correct person using two identifiers.  Location of patient:  Home  Location of MD: [facility] Name of referring provider (if blank then none associated): Names per persons and role in encounter:  MD: Earlyne Iba, Patient: name listed above.    I discussed the limitations, risks, security and privacy concerns of performing an evaluation and management service by telephone and the availability of in person appointments. I also discussed with the patient that there may be a patient responsible charge related to this service. The patient expressed understanding and agreed to proceed.  CC: inpatient f/u.   History of Present Illness: Had flu.  She hasn't used O2 except for once after discharge.  Walking off O2 now.  She is monitoring her pulse ox, usually ~96% even walking.  She is done with prednisone and tamiflu.    Cough is better.  No fevers.  Much less sputum now.  She has tessalon for cough.  That helps some.  She isn't back to baseline but is improving.    Plan for labs on 05/06/21.    Observations/Objective: No apparent distress.  Speech at baseline  Assessment and Plan: Flu.  Improved.  Needs follow-up labs.  She will come in early next week for follow-up labs.  This can be done car side if needed.  Off O2 at home.  Not hypoxic.  Routine cautions given to patient.  Inpatient course discussed with patient.  She agrees with plan.  Follow Up Instructions: see above.     I discussed the assessment and treatment plan with the patient. The patient was provided an opportunity to ask  questions and all were answered. The patient agreed with the plan and demonstrated an understanding of the instructions.   The patient was advised to call back or seek an in-person evaluation if the symptoms worsen or if the condition fails to improve as anticipated.  I provided 21 minutes of non-face-to-face time during this encounter.  Elsie Stain, MD

## 2021-05-01 DIAGNOSIS — J111 Influenza due to unidentified influenza virus with other respiratory manifestations: Secondary | ICD-10-CM | POA: Insufficient documentation

## 2021-05-01 NOTE — Assessment & Plan Note (Signed)
Flu.  Improved.  Needs follow-up labs.  She will come in early next week for follow-up labs.  This can be done car side if needed.  Off O2 at home.  Not hypoxic.  Routine cautions given to patient.  Inpatient course discussed with patient.  She agrees with plan.

## 2021-05-06 ENCOUNTER — Other Ambulatory Visit: Payer: Self-pay

## 2021-05-06 ENCOUNTER — Other Ambulatory Visit (INDEPENDENT_AMBULATORY_CARE_PROVIDER_SITE_OTHER): Payer: Medicare Other

## 2021-05-06 DIAGNOSIS — J101 Influenza due to other identified influenza virus with other respiratory manifestations: Secondary | ICD-10-CM

## 2021-05-06 LAB — CBC WITH DIFFERENTIAL/PLATELET
Basophils Absolute: 0 10*3/uL (ref 0.0–0.1)
Basophils Relative: 0.5 % (ref 0.0–3.0)
Eosinophils Absolute: 0.1 10*3/uL (ref 0.0–0.7)
Eosinophils Relative: 1.5 % (ref 0.0–5.0)
HCT: 42.3 % (ref 36.0–46.0)
Hemoglobin: 14.2 g/dL (ref 12.0–15.0)
Lymphocytes Relative: 28.7 % (ref 12.0–46.0)
Lymphs Abs: 1.3 10*3/uL (ref 0.7–4.0)
MCHC: 33.6 g/dL (ref 30.0–36.0)
MCV: 89.9 fl (ref 78.0–100.0)
Monocytes Absolute: 0.3 10*3/uL (ref 0.1–1.0)
Monocytes Relative: 7.8 % (ref 3.0–12.0)
Neutro Abs: 2.7 10*3/uL (ref 1.4–7.7)
Neutrophils Relative %: 61.5 % (ref 43.0–77.0)
Platelets: 165 10*3/uL (ref 150.0–400.0)
RBC: 4.7 Mil/uL (ref 3.87–5.11)
RDW: 13.8 % (ref 11.5–15.5)
WBC: 4.4 10*3/uL (ref 4.0–10.5)

## 2021-05-06 LAB — BASIC METABOLIC PANEL
BUN: 21 mg/dL (ref 6–23)
CO2: 28 mEq/L (ref 19–32)
Calcium: 9.7 mg/dL (ref 8.4–10.5)
Chloride: 104 mEq/L (ref 96–112)
Creatinine, Ser: 1.13 mg/dL (ref 0.40–1.20)
GFR: 47.35 mL/min — ABNORMAL LOW (ref >60.00)
Glucose, Bld: 108 mg/dL — ABNORMAL HIGH (ref 70–99)
Potassium: 4.8 mEq/L (ref 3.5–5.1)
Sodium: 143 mEq/L (ref 135–145)

## 2021-05-22 DIAGNOSIS — J9601 Acute respiratory failure with hypoxia: Secondary | ICD-10-CM | POA: Diagnosis not present

## 2021-06-06 ENCOUNTER — Telehealth: Payer: Self-pay | Admitting: Family Medicine

## 2021-06-06 NOTE — Chronic Care Management (AMB) (Signed)
  Chronic Care Management   Outreach Note  06/06/2021 Name: Hannah Pitts MRN: [id number] DOB: [birth year]  Referred by: Tonia Ghent, MD Reason for referral : No chief complaint on file.   An unsuccessful telephone outreach was attempted today. The patient was referred to the pharmacist for assistance with care management and care coordination.   Follow Up Plan:   Tatjana Dellinger Upstream Scheduler

## 2021-06-14 ENCOUNTER — Telehealth: Payer: Self-pay

## 2021-06-14 NOTE — Telephone Encounter (Signed)
Please triage patient and let me know about her situation.  Thanks.

## 2021-06-14 NOTE — Telephone Encounter (Signed)
Called patient and got her voicemail. Left a message on voicemail for patient to call the office back.

## 2021-06-14 NOTE — Telephone Encounter (Signed)
Pt called in and states that she was diagnosed with Diverticulosis a few years ago and she is having a flare up that started on 06/11/21. She is experiencing abdominal pain, and diarrhea with clear mucous in it. She hasn't really been eating anything since Tuesday, but she is trying to stay hydrated. She is asking what she can take for the pain, as that is the worst of the symptoms. Please advise.

## 2021-06-14 NOTE — Telephone Encounter (Signed)
Left another message on voicemail for patient to call the office back. 

## 2021-06-17 NOTE — Telephone Encounter (Signed)
Spoke to patient by telephone and was advised that she is doing much better. Patient stated when she got the call back Friday she was asleep. Patient stated that she called the office back around 4:00 pm Friday and was not able to get thru to the office. Patient stated that she is still having some soreness in her abdomen at times. Offered patient an appointment which she declined. Patient stated that she does not have a fever or diarrhea anymore. Patient was advised  if she does not continue to improve to call the office to schedule an appointment and she verbalized understanding.

## 2021-06-18 NOTE — Telephone Encounter (Signed)
Noted. Thanks.

## 2021-07-18 DIAGNOSIS — Z79899 Other long term (current) drug therapy: Secondary | ICD-10-CM | POA: Diagnosis not present

## 2021-07-18 DIAGNOSIS — M25552 Pain in left hip: Secondary | ICD-10-CM | POA: Diagnosis not present

## 2021-07-18 DIAGNOSIS — M329 Systemic lupus erythematosus, unspecified: Secondary | ICD-10-CM | POA: Diagnosis not present

## 2021-07-18 DIAGNOSIS — M1990 Unspecified osteoarthritis, unspecified site: Secondary | ICD-10-CM | POA: Diagnosis not present

## 2021-07-18 DIAGNOSIS — M064 Inflammatory polyarthropathy: Secondary | ICD-10-CM | POA: Diagnosis not present

## 2021-07-18 DIAGNOSIS — M79643 Pain in unspecified hand: Secondary | ICD-10-CM | POA: Diagnosis not present

## 2021-07-18 DIAGNOSIS — M609 Myositis, unspecified: Secondary | ICD-10-CM | POA: Diagnosis not present

## 2021-07-18 DIAGNOSIS — M8580 Other specified disorders of bone density and structure, unspecified site: Secondary | ICD-10-CM | POA: Diagnosis not present

## 2021-07-18 DIAGNOSIS — M5459 Other low back pain: Secondary | ICD-10-CM | POA: Diagnosis not present

## 2021-07-29 ENCOUNTER — Telehealth: Payer: Self-pay | Admitting: Family Medicine

## 2021-07-29 NOTE — Chronic Care Management (AMB) (Signed)
  Chronic Care Management   Note  07/29/2021 Name: Hannah Pitts MRN: [id number] DOB: [birth year]  Hannah Pitts is a 75 y.o. year old female who is a primary care patient of Tonia Ghent, MD. I reached out to Bjorn Pippin by phone today in response to a referral sent by Ms. Lisabeth Devoid Garmon's PCP, Tonia Ghent, MD.   Ms. Conyer was given information about Chronic Care Management services today including:  CCM service includes personalized support from designated clinical staff supervised by her physician, including individualized plan of care and coordination with other care providers 24/7 contact phone numbers for assistance for urgent and routine care needs. Service will only be billed when office clinical staff spend 20 minutes or more in a month to coordinate care. Only one practitioner may furnish and bill the service in a calendar month. The patient may stop CCM services at any time (effective at the end of the month) by phone call to the office staff.   Patient agreed to services and verbal consent obtained.   Follow up plan:   Tatjana Secretary/administrator

## 2021-09-05 ENCOUNTER — Telehealth: Payer: Self-pay | Admitting: Pharmacist

## 2021-09-05 NOTE — Progress Notes (Signed)
    Chronic Care Management Pharmacy Assistant   Name: Hannah Pitts MRN: [id number] DOB: 1946-04-17  Reason for Encounter: Initial Questions Appointment: Televisit 09/11/21 @ 9 am   Recent office visits:  03/29/21 Damita Dunnings (PCP) - Medicare annual wellness. Start Hydroxychlororoquine Sulfate 200 mg.   Recent consult visits:  03/27/21 Aryal (Rheumatology) - Systemic lupus erythematosus.   Hospital visits:  Medication Reconciliation was completed by comparing discharge summary, patient's EMR and Pharmacy list, and upon discussion with patient.  Admitted to the hospital on 04/18/21 due to Acute respiratory failure with hypoxia. Discharge date was 04/21/21. Discharged from [facility]?Medications Started at [facility] Discharge:?? -started benzonatate (TESSALON) guaiFENesin ([place]) oseltamivir (TAMIFLU) predniSONE (DELTASONE)  Medications that remain the same after Hospital Discharge:??  -All other medications will remain the same.    Medications: Outpatient Encounter Medications as of 09/05/2021  Medication Sig   azaTHIOprine (IMURAN) 50 MG tablet Take 75 mg by mouth daily.   Cholecalciferol (VITAMIN D3) 25 MCG (1000 UT) CAPS Take 1,000 Units by mouth daily with breakfast.   escitalopram (LEXAPRO) 10 MG tablet TAKE 1 TABLET BY MOUTH EVERY DAY   hydroxychloroquine (PLAQUENIL) 200 MG tablet Take 1 tablet (200 mg total) by mouth daily.   levothyroxine (SYNTHROID) 112 MCG tablet TAKE 1 TABLET BY MOUTH EVERY DAY EXCEPT ON SUNDAY ONLY TAKE 1/2 TABLET   loperamide (IMODIUM A-D) 2 MG tablet Take 2 mg by mouth 4 (four) times daily as needed for diarrhea or loose stools.   nystatin (MYCOSTATIN/NYSTOP) powder Apply topically 2 (two) times daily as needed.   Polyethyl Glyc-Propyl Glyc PF (SYSTANE PRESERVATIVE FREE) 0.4-0.3 % SOLN Place 1-2 drops into both eyes 4 (four) times daily. Place 1-2 drops into both eyes four times a day due to Sjgren's syndrome   No facility-administered  encounter medications on file as of 09/05/2021.    Have you seen any other providers since your last visit?  Patient states she seen her rheumatologists in July.  Any changes in your medications or health?  Patient states no recent changes in her health.  Any side effects from any medications?  Patient states no side effects at this time.  Do you have an symptoms or problems not managed by your medications?  Patient states no symptoms at this time.  Any concerns about your health right now?  Patient states no concerns at this time.  Has your provider asked that you check blood pressure, blood sugar, or follow special diet at home? Patient states she checks her BP but does not record her readings. Patient states she does not check her glucose or follow a special diet.  Do you get any type of exercise on a regular basis?  Patient states she walks  to the mailbox daily if she's feeling good.  Can you think of a goal you would like to reach for your health?  Patient states she wants to be able to walk further without getting off balance.  Do you have any problems getting your medications?  Patient states no problems at this time.  Is there anything that you would like to discuss during the appointment?  Patient states no concerns at this time.  Please bring medications and supplements to appointment.  Star Rating Drugs: None Listed  Orinda Kenner, RMA Clinical Pharmacists Assistant [phone]  Time Spent: [phone]

## 2021-09-11 ENCOUNTER — Ambulatory Visit (INDEPENDENT_AMBULATORY_CARE_PROVIDER_SITE_OTHER): Payer: Medicare Other | Admitting: Pharmacist

## 2021-09-11 ENCOUNTER — Other Ambulatory Visit: Payer: Self-pay

## 2021-09-11 DIAGNOSIS — E039 Hypothyroidism, unspecified: Secondary | ICD-10-CM

## 2021-09-11 DIAGNOSIS — F419 Anxiety disorder, unspecified: Secondary | ICD-10-CM

## 2021-09-11 DIAGNOSIS — M069 Rheumatoid arthritis, unspecified: Secondary | ICD-10-CM

## 2021-09-11 DIAGNOSIS — Z8719 Personal history of other diseases of the digestive system: Secondary | ICD-10-CM

## 2021-09-11 DIAGNOSIS — E785 Hyperlipidemia, unspecified: Secondary | ICD-10-CM

## 2021-09-11 DIAGNOSIS — M329 Systemic lupus erythematosus, unspecified: Secondary | ICD-10-CM

## 2021-09-11 NOTE — Patient Instructions (Signed)
Visit Information  Phone number for Pharmacist: [phone]  Thank you for meeting with me to discuss your medications! I look forward to working with you to achieve your health care goals. Below is a summary of what we talked about during the visit:   Goals Addressed             This Visit's Progress    Manage My Medicine       Timeframe:  Long-Range Goal Priority:  Medium Start Date:     09/11/21                        Expected End Date: 09/11/22                      Follow Up Date Dec 2022   - call for medicine refill 2 or 3 days before it runs out - call if I am sick and can't take my medicine - keep a list of all the medicines I take; vitamins and herbals too - use a pillbox to sort medicine  -Take omeprazole 20 mg (OTC) for hiatal hernia -Take Hair, Skin and Nails vitamin for hair loss -Try capsaicin cream 1-2 times daily on feet for nerve pain -Get Shingrix vaccine at local pharmacy -Call pharmacist to switch to [facility] if desired -Call PCP if depression episodes last longer or occur more frequently   Why is this important?   These steps will help you keep on track with your medicines.   Notes:         Care Plan : [place]  Updates made by Charlton Haws, RPH since 09/11/2021 12:00 AM     Problem: Hyperlipidemia, GERD, Hypothyroidism, Depression, and Anxiety, Lupus, RA   Priority: High     Long-Range Goal: Disease management   Start Date: 09/11/2021  Expected End Date: 09/11/2022  This Visit's Progress: On track  Priority: High  Note:   Current Barriers:  Unable to independently monitor therapeutic efficacy Unable to maintain control of hair loss, hiatal hernia, neuropathy  Pharmacist Clinical Goal(s):  Patient will achieve adherence to monitoring guidelines and medication adherence to achieve therapeutic efficacy maintain control of hair loss, hiatal hernia, neuropathy as evidenced by patient report  through collaboration  with PharmD and provider.   Interventions: 1:1 collaboration with Tonia Ghent, MD regarding development and update of comprehensive plan of care as evidenced by provider attestation and co-signature Inter-disciplinary care team collaboration (see longitudinal plan of care) Comprehensive medication review performed; medication list updated in electronic medical record  Hyperlipidemia / PVD: (LDL goal < 100) -Not ideally controlled - pt reports history of PVD without procedures; she does report leg pain which may or may not be related to PVD (vs lupus vs neuropathy); she has never been on a statin -Current treatment: none -Considered statin therapy - PVD diagnosis is unclear, cannot locate ABIs or notes about dx; ASCVD risk is 17% so she may benefit from statin, given patient age it is reasonable to continue monitoring without changes  Anxiety (Goal: manage symptoms) -Controlled - pt reports "I have depression"; she thinks medication helps but does have days where she sleeps all day due to low mood, these episodes do not last longer than 1-2 days; she reports she has been on escitalopram 20 mg before and did not notice a difference -Current treatment: Escitalopram 10 mg daily -Medications previously tried/failed: buspirone -PHQ9: 0 (08/2019) -GAD7: not on file -  Connected with PCP for mental health support -Educated on Benefits of medication for symptom control -Considered changing to duloxetine for additional pain management benefits, however given patient's relative stability opted to continue same regimen for now -Recommended no change to medication - if episodes lengthen or occur more often, advised pt to contact PCP.  Hypothyroidism (Goal: maintain TSH in goal range) -Controlled - recent TSH was at goal; pt reports she takes levothyroxine with other medications most of the time -Current treatment  Levothyroxine 112 mcg daily except 1/2 tab Sunday -Counseled on importance of  consistency with administration -Recommended to continue current medication  Lupus / RA (Goal: manage symptoms) -Controlled - pt reports symptoms are largely under control; she does report hair loss since starting azathioprine -Follows with Dr Kathlene November, rheumatology -Current treatment  Azathioprine 50 mg - 1.5 tab daily Hydroxychloroquine 200 mg daily Tylenol ~ twice a month -Medications previously tried: prednisone x 20 years  -Advised to try Hair, skin and nails supplement -Recommended to continue current medication  Health Maintenance -Hx PE, DVT (11/2017) - filter in place. No AC. -Dx COPD (2015) - no inhalers. Pt denies SOB/wheezing -Hx Meniere's disease - hearing loss left ear -CKD Stage 3a - GFR stable ~mid 40s -Hx hiatal hernia - recently started pepto bismol PRN -Diarrhea occurs mostly when she is nervous/anxious -Pt complains of neuropathy in her feet; she has not taken medication for this; she reports she has discussed this with Dr Kathlene November -Current therapy:  Vitamin D 1000 IU Loperamide 2 mg PRN (~once a month) Systane eye drops PRN (Sjogrens) - QID Pepto bismol (hiatal hernia) -Advised to try omeprazole OTC for hiatal hernia symptoms -Advised to try capsaicin cream for numbness in feet   Patient Goals/Self-Care Activities Patient will:  - take medications as prescribed focus on medication adherence by routine -Take omeprazole 20 mg (OTC) for hiatal hernia -Take Hair, Skin and Nails vitamin for hair loss -Try capsaicin cream 1-2 times daily on feet for nerve pain -Get Shingrix vaccine at local pharmacy -Call pharmacist to switch to [facility] if desired -Call PCP if depression episodes last longer or occur more frequently       Hannah Pitts was given information about Chronic Care Management services today including:  CCM service includes personalized support from designated clinical staff supervised by her physician, including individualized plan of care and  coordination with other care providers 24/7 contact phone numbers for assistance for urgent and routine care needs. Standard insurance, coinsurance, copays and deductibles apply for chronic care management only during months in which we provide at least 20 minutes of these services. Most insurances cover these services at 100%, however patients may be responsible for any copay, coinsurance and/or deductible if applicable. This service may help you avoid the need for more expensive face-to-face services. Only one practitioner may furnish and bill the service in a calendar month. The patient may stop CCM services at any time (effective at the end of the month) by phone call to the office staff.  Patient agreed to services and verbal consent obtained.   The patient verbalized understanding of instructions, educational materials, and care plan provided today and declined offer to receive copy of patient instructions, educational materials, and care plan.  Telephone follow up appointment with pharmacy team member scheduled for: 3 months  Charlene Brooke, PharmD, [place], CPP Clinical Pharmacist [facility] [place] [phone]

## 2021-09-11 NOTE — Progress Notes (Signed)
Chronic Care Management Pharmacy Note  09/11/2021 Name:  Hannah Pitts MRN:  [id number] DOB:  Jan 26, 1946  Summary: -Pt complains of hair loss due to azathioprine -Pt reports history of hiatal hernia, currently taking pepto bismol PRN with mild improvement in symptoms -Pt reports neuropathy in her feet, has discussed with Dr Kathlene November but no treatment given  Recommendations/Changes made from today's visit: -Advised to take Hair, Skin and Nails vitamin for hair loss -Advised to take omeprazole 20 mg (OTC) for hiatal hernia -Advised to try capsaicin cream 1-2 times daily on feet for nerve pain -Get Shingrix vaccine at local pharmacy    Subjective: Hannah Pitts is an 75 y.o. year old female who is a primary patient of Hannah Pitts, Elveria Rising, MD.  The CCM team was consulted for assistance with disease management and care coordination needs.    Engaged with patient by telephone for initial visit in response to provider referral for pharmacy case management and/or care coordination services.   Consent to Services:  The patient was given the following information about Chronic Care Management services today, agreed to services, and gave verbal consent: 1. CCM service includes personalized support from designated clinical staff supervised by the primary care provider, including individualized plan of care and coordination with other care providers 2. 24/7 contact phone numbers for assistance for urgent and routine care needs. 3. Service will only be billed when office clinical staff spend 20 minutes or more in a month to coordinate care. 4. Only one practitioner may furnish and bill the service in a calendar month. 5.The patient may stop CCM services at any time (effective at the end of the month) by phone call to the office staff. 6. The patient will be responsible for cost sharing (co-pay) of up to 20% of the service fee (after annual deductible is met). Patient agreed to services and consent  obtained.  Patient Care Team: Tonia Ghent, MD as PCP - General (Family Medicine) Charlton Haws, [facility] as Pharmacist (Pharmacist)  Patient is from [place]. She is 1 of 10 siblings. Her father passed when she was 74, her mother when she was 53. Her husband passed away September 21, 1999. All of her siblings have passed away as well. She is by herself during the day. Her daughter, granddaughter and great-granddaughter live with her but are out of the house during the day. Patient wants to be able to walk further - she used to love walking. Some days her balance is off. The lupus joint pain comes and goes randomly. She is able to do simple housework. She uses cane or rollator around the house.  Recent office visits: 04/30/21 Dr Hannah Pitts (VV): hospital f/u - flu. Off O2 now. F/U labs ordered.  03/29/21 Hannah Pitts (PCP) - Medicare annual wellness. Updated hydroxychloroquine Rx - 200 mg daily.  Recent consult visits: 03/27/21 [facility] (Rheumatology) - Systemic lupus erythematosus.   Hospital visits: Medication Reconciliation was completed by comparing discharge summary, patient's EMR and Pharmacy list, and upon discussion with patient.   Admitted to the hospital on 04/18/21 due to Influenza. Discharge date was 04/21/21. Discharged from [place]?Medications Started at [facility] Discharge:?? -started benzonatate (TESSALON) guaiFENesin ([place]) oseltamivir (TAMIFLU) predniSONE (DELTASONE)   Medications that remain the same after Hospital Discharge:??  -All other medications will remain the same.     Objective:  Lab Results  Component Value Date   CREATININE 1.13 05/06/2021   BUN 21 05/06/2021   GFR 47.35 (  L) 05/06/2021   GFRNONAA 48 (L) 04/21/2021   GFRAA >60 12/24/2017   NA 143 05/06/2021   K 4.8 05/06/2021   CALCIUM 9.7 05/06/2021   CO2 28 05/06/2021   GLUCOSE 108 (H) 05/06/2021    Lab Results  Component Value Date/Time   HGBA1C 5.6 02/27/2000 12:00 AM   GFR 47.35 (L)  05/06/2021 12:57 PM   GFR 45.46 (L) 03/22/2021 08:22 AM    Last diabetic Eye exam:  Lab Results  Component Value Date/Time   HMDIABEYEEXA normal 01/22/2009 12:00 AM    Last diabetic Foot exam: No results found for: HMDIABFOOTEX   Lab Results  Component Value Date   CHOL 186 03/22/2021   HDL 42.10 03/22/2021   LDLCALC 113 (H) 03/22/2021   TRIG 155.0 (H) 03/22/2021   CHOLHDL 4 03/22/2021    Hepatic Function Latest Ref Rng & Units 04/18/2021 03/22/2021 09/08/2019  Total Protein 6.5 - 8.1 g/dL 6.7 6.8 6.5  Albumin 3.5 - 5.0 g/dL 3.5 4.3 4.0  AST 15 - 41 U/L 33 30 26  ALT 0 - 44 U/L 20 23 15   Alk Phosphatase 38 - 126 U/L 81 111 104  Total Bilirubin 0.3 - 1.2 mg/dL 0.7 0.6 0.6  Bilirubin, Direct 0.0 - 0.3 mg/dL - - -    Lab Results  Component Value Date/Time   TSH 1.57 03/22/2021 08:22 AM   TSH 8.48 (H) 09/08/2019 08:24 AM   FREET4 0.87 07/17/2011 08:44 AM   FREET4 1.1 03/06/2010 08:30 AM    CBC Latest Ref Rng & Units 05/06/2021 04/21/2021 04/20/2021  WBC 4.0 - 10.5 K/uL 4.4 2.9(L) 4.0  Hemoglobin 12.0 - 15.0 g/dL 14.2 11.1(L) 11.1(L)  Hematocrit 36.0 - 46.0 % 42.3 33.9(L) 34.9(L)  Platelets 150.0 - 400.0 K/uL 165.0 111(L) 104(L)    Lab Results  Component Value Date/Time   VD25OH 45.47 03/22/2021 08:22 AM   VD25OH 37.39 09/08/2019 08:24 AM    Clinical ASCVD: Yes  - PVD The 10-year ASCVD risk score (Arnett DK, et al., 2019) is: 19%   Values used to calculate the score:     Age: 75 years     Sex: Female     Is Non-Hispanic African American: No     Diabetic: No     Tobacco smoker: No     Systolic Blood Pressure: 423 mmHg     Is BP treated: No     HDL Cholesterol: 42.1 mg/dL     Total Cholesterol: 186 mg/dL    Depression screen [facility] 2/9 09/15/2019 04/28/2017 08/02/2015  Decreased Interest 0 1 1  Down, Depressed, Hopeless 0 1 1  PHQ - 2 Score 0 2 2     Social History   Tobacco Use  Smoking Status Never  Smokeless Tobacco Never   BP Readings from Last 3 Encounters:   04/30/21 129/82  04/21/21 (!) 141/63  03/29/21 128/72   Pulse Readings from Last 3 Encounters:  04/30/21 77  04/21/21 64  03/29/21 73   Wt Readings from Last 3 Encounters:  04/30/21 219 lb (99.3 kg)  04/18/21 218 lb 14.7 oz (99.3 kg)  03/29/21 219 lb (99.3 kg)   BMI Readings from Last 3 Encounters:  04/30/21 36.44 kg/m  04/18/21 36.43 kg/m  03/29/21 36.44 kg/m    Assessment/Interventions: Review of patient past medical history, allergies, medications, health status, including review of consultants reports, laboratory and other test data, was performed as part of comprehensive evaluation and provision of chronic care management services.   SDOH:  (  Social Determinants of Health) assessments and interventions performed: Yes  SDOH Screenings   Alcohol Screen: Not on file  Depression (PHQ2-9): Not on file  Financial Resource Strain: Not on file  Food Insecurity: Not on file  Housing: Not on file  Physical Activity: Not on file  Social Connections: Not on file  Stress: Not on file  Tobacco Use: Low Risk    Smoking Tobacco Use: Never   Smokeless Tobacco Use: Never  Transportation Needs: Not on file    [place]  Allergies  Allergen Reactions   Heparin Other (See Comments)    Bruising/bleeding    Medications Reviewed Today     Reviewed by Charlton Haws, [facility] (Pharmacist) on 09/11/21 at [place] List Status: <None>   Medication Order Taking? Sig Documenting Provider Last Dose Status Informant  acetaminophen (TYLENOL) 500 MG tablet [id number] Yes Take 500 mg by mouth every 6 (six) hours as needed. [provider] Taking Active   azaTHIOprine (IMURAN) 50 MG tablet [id number] Yes Take 75 mg by mouth daily. [provider] Taking Active Self  Biotin w/ Vitamins C & E (HAIR/SKIN/NAILS PO) [id number] Yes Take by mouth. [provider]  Active   bismuth subsalicylate (PEPTO BISMOL) 262 MG/15ML suspension [id number] Yes Take 30 mLs by  mouth every 6 (six) hours as needed. [provider] Taking Active   capsaicin (ZOSTRIX) 0.025 % cream [id number] Yes Apply topically 2 (two) times daily. [provider]  Active   Cholecalciferol (VITAMIN D3) 25 MCG (1000 UT) CAPS [id number] Yes Take 1,000 Units by mouth daily with breakfast. [provider] Taking Active Self  escitalopram (LEXAPRO) 10 MG tablet [id number] Yes TAKE 1 TABLET BY MOUTH EVERY DAY Tonia Ghent, MD Taking Active   hydroxychloroquine (PLAQUENIL) 200 MG tablet [id number] Yes Take 1 tablet (200 mg total) by mouth daily. Tonia Ghent, MD Taking Active Self  levothyroxine (SYNTHROID) 112 MCG tablet [id number] Yes TAKE 1 TABLET BY MOUTH EVERY DAY EXCEPT ON SUNDAY ONLY TAKE 1/2 TABLET Tonia Ghent, MD Taking Active Self  loperamide (IMODIUM A-D) 2 MG tablet [id number] Yes Take 2 mg by mouth 4 (four) times daily as needed for diarrhea or loose stools. [provider] Taking Active Self  omeprazole (PRILOSEC OTC) 20 MG tablet [id number] Yes Take 20 mg by mouth daily. [provider]  Active   Polyethyl Glyc-Propyl Glyc PF (SYSTANE PRESERVATIVE FREE) 0.4-0.3 % SOLN [id number] Yes Place 1-2 drops into both eyes 4 (four) times daily. Place 1-2 drops into both eyes four times a day due to Sjgren's syndrome [provider] Taking Active             Patient Active Problem List   Diagnosis Date Noted   Influenza 05/01/2021   Acute respiratory failure with hypoxia ([place]) 04/18/2021   Acute kidney injury superimposed on chronic kidney disease ([place]) 04/18/2021   History of DVT (deep vein thrombosis) 04/18/2021   History of pulmonary embolism 04/18/2021   Thrombocytopenia ([place]) 04/18/2021   Elevated serum creatinine 01/20/2018   Pulmonary embolism ([place]) 12/12/2017   Congestive heart failure (CHF) ([place]) 12/12/2017   Obesity hypoventilation syndrome ([place]) 12/12/2017   Fall at home 08/05/2016   COPD with acute bronchitis  ([place]) 12/12/2014   Hearing loss 12/12/2014   Low back pain 10/11/2014   Advance care planning 07/31/2014   Abnormality of gait 07/31/2014   Dyspnea on exertion 07/20/2013   Obesity 07/20/2013   Medicare annual wellness visit, subsequent 07/27/2012  Palpitations 03/23/2012   Anxiety 06/10/2011   RASH AND OTHER NONSPECIFIC SKIN ERUPTION 09/13/2010   Dysphagia 03/12/2010   LUPUS 09/25/2009   SKIN LESION 09/21/2007   Tremor 05/28/2007   Hypothyroidism 05/27/2007   Anemia 05/27/2007   PERIPHERAL VASCULAR DISEASE 05/27/2007   GERD 05/27/2007   Rheumatoid arthritis ([place]) 05/27/2007   MENIERE'S DISEASE, HX OF 05/27/2007    Immunization History  Administered Date(s) Administered   Influenza Whole 09/28/2005, 11/29/2007   Influenza, High Dose Seasonal PF 12/13/2017   Influenza,inj,Quad PF,6+ Mos 10/18/2013   Influenza-Unspecified 09/09/2019   PFIZER(Purple Top)SARS-COV-2 Vaccination 06/16/2020, 07/07/2020, 01/09/2021   Pneumococcal Conjugate-13 08/02/2015   Pneumococcal Polysaccharide-23 07/29/1998, 07/26/2012   Td 11/28/1997, 03/12/2010    Conditions to be addressed/monitored:  Hyperlipidemia, GERD, Hypothyroidism, Depression, and Anxiety, Lupus, RA  Care Plan : [place]  Updates made by Charlton Haws, [place] since 09/11/2021 12:00 AM     Problem: Hyperlipidemia, GERD, Hypothyroidism, Depression, and Anxiety, Lupus, RA   Priority: High     Long-Range Goal: Disease management   Start Date: 09/11/2021  Expected End Date: 09/11/2022  This Visit's Progress: On track  Priority: High  Note:   Current Barriers:  Unable to independently monitor therapeutic efficacy Unable to maintain control of hair loss, hiatal hernia, neuropathy  Pharmacist Clinical Goal(s):  Patient will achieve adherence to monitoring guidelines and medication adherence to achieve therapeutic efficacy maintain control of hair loss, hiatal hernia, neuropathy as evidenced by patient report   through collaboration with PharmD and provider.   Interventions: 1:1 collaboration with Tonia Ghent, MD regarding development and update of comprehensive plan of care as evidenced by provider attestation and co-signature Inter-disciplinary care team collaboration (see longitudinal plan of care) Comprehensive medication review performed; medication list updated in electronic medical record  Hyperlipidemia / PVD: (LDL goal < 100) -Not ideally controlled - pt reports history of PVD without procedures; she does report leg pain which may or may not be related to PVD (vs lupus vs neuropathy); she has never been on a statin -Current treatment: none -Considered statin therapy - PVD diagnosis is unclear, cannot locate ABIs or notes about dx; ASCVD risk is 17% so she may benefit from statin, given patient age it is reasonable to continue monitoring without changes  Anxiety (Goal: manage symptoms) -Controlled - pt reports "I have depression"; she thinks medication helps but does have days where she sleeps all day due to low mood, these episodes do not last longer than 1-2 days; she reports she has been on escitalopram 20 mg before and did not notice a difference -Current treatment: Escitalopram 10 mg daily -Medications previously tried/failed: buspirone -PHQ9: 0 (08/2019) -GAD7: not on file -Connected with PCP for mental health support -Educated on Benefits of medication for symptom control -Considered changing to duloxetine for additional pain management benefits, however given patient's relative stability opted to continue same regimen for now -Recommended no change to medication - if episodes lengthen or occur more often, advised pt to contact PCP.  Hypothyroidism (Goal: maintain TSH in goal range) -Controlled - recent TSH was at goal; pt reports she takes levothyroxine with other medications most of the time -Current treatment  Levothyroxine 112 mcg daily except 1/2 tab Sunday -Counseled on  importance of consistency with administration -Recommended to continue current medication  Lupus / RA (Goal: manage symptoms) -Controlled - pt reports symptoms are largely under control; she does report hair loss since starting azathioprine -Follows with Dr Kathlene November, rheumatology -Current treatment  Azathioprine 50 mg - 1.5 tab daily Hydroxychloroquine 200 mg daily Tylenol ~ twice a month -Medications previously tried: prednisone x 20 years  -Advised to try Hair, skin and nails supplement -Recommended to continue current medication  Health Maintenance -Hx PE, DVT (11/2017) - filter in place. No AC. -Dx COPD (2015) - no inhalers. Pt denies SOB/wheezing -Hx Meniere's disease - hearing loss left ear -CKD Stage 3a - GFR stable ~mid 40s -Hx hiatal hernia - recently started pepto bismol PRN -Diarrhea occurs mostly when she is nervous/anxious -Pt complains of neuropathy in her feet; she has not taken medication for this; she reports she has discussed this with Dr Kathlene November -Current therapy:  Vitamin D 1000 IU Loperamide 2 mg PRN (~once a month) Systane eye drops PRN (Sjogrens) - QID Pepto bismol (hiatal hernia) -Advised to try omeprazole OTC for hiatal hernia symptoms -Advised to try capsaicin cream for numbness in feet   Patient Goals/Self-Care Activities Patient will:  - take medications as prescribed focus on medication adherence by routine -Take omeprazole 20 mg (OTC) for hiatal hernia -Take Hair, Skin and Nails vitamin for hair loss -Try capsaicin cream 1-2 times daily on feet for nerve pain -Get Shingrix vaccine at local pharmacy -Call pharmacist to switch to [facility] if desired -Call PCP if depression episodes last longer or occur more frequently       Medication Assistance: None required.  Patient affirms current coverage meets needs.  Compliance/Adherence/Medication fill history: Care Gaps: Vaccines - Shingrix, TDAP (03/12/20), covid booster (04/03/21), flu DEXA  scan (02/13/10) --Advised to get Shingrix vaccine at local pharmacy. Pt is planning to get Flu shot at Rheumatology office visit in October.  Star-Rating Drugs: None  Patient[facility] pharmacy [facility]  [id number] [place] [id number]: [phone]: [phone] Uses pill box? No - prefers bottles Pt endorses 100% compliance  We discussed: Benefits of medication synchronization, packaging and delivery as well as enhanced pharmacist oversight with [facility]. - Pt wants to think about it and will call if she wants to switch.  Patient decided to: Continue current medication management strategy  Care Plan and Follow Up Patient Decision:  Patient agrees to Care Plan and Follow-up.  Plan: Telephone follow up appointment with care management team member scheduled for:  3 months  LCharlene Brooke PharmD, [place] CPP Clinical Pharmacist [facility] [phone]

## 2021-09-27 ENCOUNTER — Other Ambulatory Visit: Payer: Self-pay | Admitting: Family Medicine

## 2021-09-27 DIAGNOSIS — M069 Rheumatoid arthritis, unspecified: Secondary | ICD-10-CM

## 2021-09-27 DIAGNOSIS — E039 Hypothyroidism, unspecified: Secondary | ICD-10-CM

## 2021-09-27 DIAGNOSIS — E785 Hyperlipidemia, unspecified: Secondary | ICD-10-CM

## 2021-10-03 ENCOUNTER — Telehealth: Payer: Self-pay

## 2021-10-03 NOTE — Chronic Care Management (AMB) (Signed)
Chronic Care Management Pharmacy Assistant   Name: Hannah Pitts  MRN: [id number] DOB: 03/28/1946  Reason for Encounter: General Adherence Disease State   Recent office visits:  None since last CCM contact  Recent consult visits:  None since last CCM contact  Hospital visits:  None since last CCM contact  Medications: Outpatient Encounter Medications as of 10/03/2021  Medication Sig   acetaminophen (TYLENOL) 500 MG tablet Take 500 mg by mouth every 6 (six) hours as needed.   azaTHIOprine (IMURAN) 50 MG tablet Take 75 mg by mouth daily.   Biotin w/ Vitamins C & E (HAIR/SKIN/NAILS PO) Take by mouth.   bismuth subsalicylate (PEPTO BISMOL) 262 MG/15ML suspension Take 30 mLs by mouth every 6 (six) hours as needed.   capsaicin (ZOSTRIX) 0.025 % cream Apply topically 2 (two) times daily.   Cholecalciferol (VITAMIN D3) 25 MCG (1000 UT) CAPS Take 1,000 Units by mouth daily with breakfast.   escitalopram (LEXAPRO) 10 MG tablet TAKE 1 TABLET BY MOUTH EVERY DAY   hydroxychloroquine (PLAQUENIL) 200 MG tablet Take 1 tablet (200 mg total) by mouth daily.   levothyroxine (SYNTHROID) 112 MCG tablet TAKE 1 TABLET BY MOUTH EVERY DAY EXCEPT ON SUNDAY ONLY TAKE 1/2 TABLET   loperamide (IMODIUM A-D) 2 MG tablet Take 2 mg by mouth 4 (four) times daily as needed for diarrhea or loose stools.   omeprazole (PRILOSEC OTC) 20 MG tablet Take 20 mg by mouth daily.   Polyethyl Glyc-Propyl Glyc PF (SYSTANE PRESERVATIVE FREE) 0.4-0.3 % SOLN Place 1-2 drops into both eyes 4 (four) times daily. Place 1-2 drops into both eyes four times a day due to Sjgren's syndrome   No facility-administered encounter medications on file as of 10/03/2021.       Contacted Hannah Pitts on 10/07/21 for general disease state and medication adherence call.   Patient is not > 5 days past due for refill on the following medications per chart history:  Star Medications: Medication Name/mg Last Fill Days Supply None  identified    What concerns do you have about your medications? The patient reports no concerns at this time.Inquired about interest in [facility] and [facility] but will [facility] .  The patient denies side effects with her medications.   How often do you forget or accidentally miss a dose? Rarely  Do you use a pillbox? Yes The patient reports a week of medications at a time.  Are you having any problems getting your medications from your pharmacy? No   Has the cost of your medications been a concern? No The patient discussed several medications and none were too expensive.  Since last visit with CPP, no interventions have been made:   The patient has not had an ED visit since last contact.   The patient reports the following and denies problems with their health. The patient states her right leg is painful with laying down and rest. She wants to improve walking and improve distance she can go. She has devices to assist her to prevent falling.   Counseled patient on:  [place] job taking medications, Importance of taking medication daily without missed doses, Benefits of adherence packaging or a pillbox, and Access to CCM team for any cost, medication or pharmacy concerns.   Care Gaps: Annual wellness visit in last year? Yes  03/29/21 Most Recent BP reading:  128/72  73-P  03/29/21    CCM appointment on 12/06/21  Charlene Brooke, RPH  [name], [place] Assistant [phone]  Total time spent for month CPA:  30 min

## 2021-12-02 ENCOUNTER — Telehealth: Payer: Self-pay

## 2021-12-02 NOTE — Progress Notes (Signed)
    Chronic Care Management Pharmacy Assistant   Name: Hannah Pitts  MRN: [id number] DOB: Oct 13, 1946  Reason for Encounter: CCM (Appointment Reminder)   Medications: Outpatient Encounter Medications as of 12/02/2021  Medication Sig   acetaminophen (TYLENOL) 500 MG tablet Take 500 mg by mouth every 6 (six) hours as needed.   azaTHIOprine (IMURAN) 50 MG tablet Take 75 mg by mouth daily.   Biotin w/ Vitamins C & E (HAIR/SKIN/NAILS PO) Take by mouth.   bismuth subsalicylate (PEPTO BISMOL) 262 MG/15ML suspension Take 30 mLs by mouth every 6 (six) hours as needed.   capsaicin (ZOSTRIX) 0.025 % cream Apply topically 2 (two) times daily.   Cholecalciferol (VITAMIN D3) 25 MCG (1000 UT) CAPS Take 1,000 Units by mouth daily with breakfast.   escitalopram (LEXAPRO) 10 MG tablet TAKE 1 TABLET BY MOUTH EVERY DAY   hydroxychloroquine (PLAQUENIL) 200 MG tablet Take 1 tablet (200 mg total) by mouth daily.   levothyroxine (SYNTHROID) 112 MCG tablet TAKE 1 TABLET BY MOUTH EVERY DAY EXCEPT ON SUNDAY ONLY TAKE 1/2 TABLET   loperamide (IMODIUM A-D) 2 MG tablet Take 2 mg by mouth 4 (four) times daily as needed for diarrhea or loose stools.   omeprazole (PRILOSEC OTC) 20 MG tablet Take 20 mg by mouth daily.   Polyethyl Glyc-Propyl Glyc PF (SYSTANE PRESERVATIVE FREE) 0.4-0.3 % SOLN Place 1-2 drops into both eyes 4 (four) times daily. Place 1-2 drops into both eyes four times a day due to Sjgren's syndrome   No facility-administered encounter medications on file as of 12/02/2021.   MAVERY MILLING was contacted to remind her of her upcoming telephone visit with Charlene Brooke on 12/06/2021 at 1:00. Patient was reminded to have all medications, supplements and any blood glucose and blood pressure readings available for review at appointment.   Patient states she takes her blood pressure on her own (has not been asked by any provider to do so). Patient will take her blood pressure every day and report the  readings to [facility].   Are you having any problems with your medications? No  Do you have any concerns you like to discuss with the pharmacist? No  Star Rating Drugs: Medication:  Last Fill: Day Supply No Star Rating Drugs indicated  Charlene Brooke, CPP notified  Marijean Niemann, [place]  Time Spent: 10 Minutes

## 2021-12-06 ENCOUNTER — Telehealth: Payer: Medicare Other

## 2021-12-06 ENCOUNTER — Telehealth: Payer: Self-pay | Admitting: Pharmacist

## 2021-12-06 NOTE — Progress Notes (Deleted)
Chronic Care Management Pharmacy Note  12/06/2021 Name:  Hannah Pitts MRN:  [id number] DOB:  Mar 18, 1946  Summary: -Pt complains of hair loss due to azathioprine -Pt reports history of hiatal hernia, currently taking pepto bismol PRN with mild improvement in symptoms -Pt reports neuropathy in her feet, has discussed with Dr Kathlene November but no treatment given  Recommendations/Changes made from today's visit: -Advised to take Hair, Skin and Nails vitamin for hair loss -Advised to take omeprazole 20 mg (OTC) for hiatal hernia -Advised to try capsaicin cream 1-2 times daily on feet for nerve pain -Get Shingrix vaccine at local pharmacy    Subjective: Hannah Pitts is an 75 y.o. year old female who is a primary patient of Damita Dunnings, Elveria Rising, MD.  The CCM team was consulted for assistance with disease management and care coordination needs.    Engaged with patient by telephone for follow up visit in response to provider referral for pharmacy case management and/or care coordination services.   Consent to Services:  The patient was given information about Chronic Care Management services, agreed to services, and gave verbal consent prior to initiation of services.  Please see initial visit note for detailed documentation.   Patient Care Team: Tonia Ghent, MD as PCP - General (Family Medicine) Charlton Haws, [facility] as Pharmacist (Pharmacist)  Patient is from [place]. She is 1 of 10 siblings. Her father passed when she was 67, her mother when she was 25. Her husband passed away 15-Sep-1999. All of her siblings have passed away as well. She is by herself during the day. Her daughter, granddaughter and great-granddaughter live with her but are out of the house during the day. Patient wants to be able to walk further - she used to love walking. Some days her balance is off. The lupus joint pain comes and goes randomly. She is able to do simple housework. She uses cane or rollator around  the house.  Recent office visits: 04/30/21 Dr Damita Dunnings (VV): hospital f/u - flu. Off O2 now. F/U labs ordered.  03/29/21 Damita Dunnings (PCP) - Medicare annual wellness. Updated hydroxychloroquine Rx - 200 mg daily.  Recent consult visits: 03/27/21 [facility] (Rheumatology) - Systemic lupus erythematosus.   Hospital visits: Medication Reconciliation was completed by comparing discharge summary, patient's EMR and Pharmacy list, and upon discussion with patient.   Admitted to the hospital on 04/18/21 due to Influenza. Discharge date was 04/21/21. Discharged from [place]?Medications Started at [facility] Discharge:?? -started benzonatate (TESSALON) guaiFENesin ([place]) oseltamivir (TAMIFLU) predniSONE (DELTASONE)   Medications that remain the same after Hospital Discharge:??  -All other medications will remain the same.     Objective:  Lab Results  Component Value Date   CREATININE 1.13 05/06/2021   BUN 21 05/06/2021   GFR 47.35 (L) 05/06/2021   GFRNONAA 48 (L) 04/21/2021   GFRAA >60 12/24/2017   NA 143 05/06/2021   K 4.8 05/06/2021   CALCIUM 9.7 05/06/2021   CO2 28 05/06/2021   GLUCOSE 108 (H) 05/06/2021    Lab Results  Component Value Date/Time   HGBA1C 5.6 02/27/2000 12:00 AM   GFR 47.35 (L) 05/06/2021 12:57 PM   GFR 45.46 (L) 03/22/2021 08:22 AM    Last diabetic Eye exam:  Lab Results  Component Value Date/Time   HMDIABEYEEXA normal 01/22/2009 12:00 AM    Last diabetic Foot exam: No results found for: HMDIABFOOTEX   Lab Results  Component Value Date   CHOL  186 03/22/2021   HDL 42.10 03/22/2021   LDLCALC 113 (H) 03/22/2021   TRIG 155.0 (H) 03/22/2021   CHOLHDL 4 03/22/2021    Hepatic Function Latest Ref Rng & Units 04/18/2021 03/22/2021 09/08/2019  Total Protein 6.5 - 8.1 g/dL 6.7 6.8 6.5  Albumin 3.5 - 5.0 g/dL 3.5 4.3 4.0  AST 15 - 41 U/L 33 30 26  ALT 0 - 44 U/L 20 23 15   Alk Phosphatase 38 - 126 U/L 81 111 104  Total Bilirubin 0.3 - 1.2 mg/dL 0.7  0.6 0.6  Bilirubin, Direct 0.0 - 0.3 mg/dL - - -    Lab Results  Component Value Date/Time   TSH 1.57 03/22/2021 08:22 AM   TSH 8.48 (H) 09/08/2019 08:24 AM   FREET4 0.87 07/17/2011 08:44 AM   FREET4 1.1 03/06/2010 08:30 AM    CBC Latest Ref Rng & Units 05/06/2021 04/21/2021 04/20/2021  WBC 4.0 - 10.5 K/uL 4.4 2.9(L) 4.0  Hemoglobin 12.0 - 15.0 g/dL 14.2 11.1(L) 11.1(L)  Hematocrit 36.0 - 46.0 % 42.3 33.9(L) 34.9(L)  Platelets 150.0 - 400.0 K/uL 165.0 111(L) 104(L)    Lab Results  Component Value Date/Time   VD25OH 45.47 03/22/2021 08:22 AM   VD25OH 37.39 09/08/2019 08:24 AM    Clinical ASCVD: Yes  - PVD The 10-year ASCVD risk score (Arnett DK, et al., 2019) is: 17.7%   Values used to calculate the score:     Age: 26 years     Sex: Female     Is Non-Hispanic African American: No     Diabetic: No     Tobacco smoker: No     Systolic Blood Pressure: 660 mmHg     Is BP treated: No     HDL Cholesterol: 42.1 mg/dL     Total Cholesterol: 186 mg/dL    Depression screen [facility] 2/9 09/15/2019 04/28/2017 08/02/2015  Decreased Interest 0 1 1  Down, Depressed, Hopeless 0 1 1  PHQ - 2 Score 0 2 2     Social History   Tobacco Use  Smoking Status Never  Smokeless Tobacco Never   BP Readings from Last 3 Encounters:  04/30/21 129/82  04/21/21 (!) 141/63  03/29/21 128/72   Pulse Readings from Last 3 Encounters:  04/30/21 77  04/21/21 64  03/29/21 73   Wt Readings from Last 3 Encounters:  04/30/21 219 lb (99.3 kg)  04/18/21 218 lb 14.7 oz (99.3 kg)  03/29/21 219 lb (99.3 kg)   BMI Readings from Last 3 Encounters:  04/30/21 36.44 kg/m  04/18/21 36.43 kg/m  03/29/21 36.44 kg/m    Assessment/Interventions: Review of patient past medical history, allergies, medications, health status, including review of consultants reports, laboratory and other test data, was performed as part of comprehensive evaluation and provision of chronic care management services.   SDOH:  (Social  Determinants of Health) assessments and interventions performed: Yes  SDOH Screenings   Alcohol Screen: Not on file  Depression (PHQ2-9): Not on file  Financial Resource Strain: Not on file  Food Insecurity: Not on file  Housing: Not on file  Physical Activity: Not on file  Social Connections: Not on file  Stress: Not on file  Tobacco Use: Low Risk    Smoking Tobacco Use: Never   Smokeless Tobacco Use: Never   Passive Exposure: Not on file  Transportation Needs: Not on file    CCM Care Plan  Allergies  Allergen Reactions   Heparin Other (See Comments)    Bruising/bleeding  Medications Reviewed Today     Reviewed by Charlton Haws, [facility] (Pharmacist) on 09/11/21 at 64  Med List Status: <None>   Medication Order Taking? Sig Documenting Provider Last Dose Status Informant  acetaminophen (TYLENOL) 500 MG tablet [id number] Yes Take 500 mg by mouth every 6 (six) hours as needed. [provider] Taking Active   azaTHIOprine (IMURAN) 50 MG tablet [id number] Yes Take 75 mg by mouth daily. [provider] Taking Active Self  Biotin w/ Vitamins C & E (HAIR/SKIN/NAILS PO) [id number] Yes Take by mouth. [provider]  Active   bismuth subsalicylate (PEPTO BISMOL) 262 MG/15ML suspension [id number] Yes Take 30 mLs by mouth every 6 (six) hours as needed. [provider] Taking Active   capsaicin (ZOSTRIX) 0.025 % cream [id number] Yes Apply topically 2 (two) times daily. [provider]  Active   Cholecalciferol (VITAMIN D3) 25 MCG (1000 UT) CAPS [id number] Yes Take 1,000 Units by mouth daily with breakfast. [provider] Taking Active Self  escitalopram (LEXAPRO) 10 MG tablet [id number] Yes TAKE 1 TABLET BY MOUTH EVERY DAY Tonia Ghent, MD Taking Active   hydroxychloroquine (PLAQUENIL) 200 MG tablet [id number] Yes Take 1 tablet (200 mg total) by mouth daily. Tonia Ghent, MD Taking Active Self  levothyroxine (SYNTHROID) 112 MCG  tablet [id number] Yes TAKE 1 TABLET BY MOUTH EVERY DAY EXCEPT ON SUNDAY ONLY TAKE 1/2 TABLET Tonia Ghent, MD Taking Active Self  loperamide (IMODIUM A-D) 2 MG tablet [id number] Yes Take 2 mg by mouth 4 (four) times daily as needed for diarrhea or loose stools. [provider] Taking Active Self  omeprazole (PRILOSEC OTC) 20 MG tablet [id number] Yes Take 20 mg by mouth daily. [provider]  Active   Polyethyl Glyc-Propyl Glyc PF (SYSTANE PRESERVATIVE FREE) 0.4-0.3 % SOLN [id number] Yes Place 1-2 drops into both eyes 4 (four) times daily. Place 1-2 drops into both eyes four times a day due to Sjgren's syndrome [provider] Taking Active             Patient Active Problem List   Diagnosis Date Noted   Influenza 05/01/2021   Acute respiratory failure with hypoxia ([place]) 04/18/2021   Acute kidney injury superimposed on chronic kidney disease ([place]) 04/18/2021   History of DVT (deep vein thrombosis) 04/18/2021   History of pulmonary embolism 04/18/2021   Thrombocytopenia ([place]) 04/18/2021   Elevated serum creatinine 01/20/2018   Pulmonary embolism ([place]) 12/12/2017   Congestive heart failure (CHF) ([place]) 12/12/2017   Obesity hypoventilation syndrome ([place]) 12/12/2017   Fall at home 08/05/2016   COPD with acute bronchitis ([place]) 12/12/2014   Hearing loss 12/12/2014   Low back pain 10/11/2014   Advance care planning 07/31/2014   Abnormality of gait 07/31/2014   Dyspnea on exertion 07/20/2013   Obesity 07/20/2013   Medicare annual wellness visit, subsequent 07/27/2012   Palpitations 03/23/2012   Anxiety 06/10/2011   RASH AND OTHER NONSPECIFIC SKIN ERUPTION 09/13/2010   Dysphagia 03/12/2010   LUPUS 09/25/2009   SKIN LESION 09/21/2007   Tremor 05/28/2007   Hypothyroidism 05/27/2007   Anemia 05/27/2007   PERIPHERAL VASCULAR DISEASE 05/27/2007   GERD 05/27/2007   Rheumatoid arthritis ([place]) 05/27/2007   MENIERE'S DISEASE, HX OF 05/27/2007    Immunization  History  Administered Date(s) Administered   Influenza Whole 09/28/2005, 11/29/2007   Influenza, High Dose Seasonal PF 12/13/2017   Influenza,inj,Quad PF,6+ Mos 10/18/2013   Influenza-Unspecified 09/09/2019   PFIZER(Purple Top)SARS-COV-2 Vaccination 06/16/2020, 07/07/2020, 01/09/2021  Pneumococcal Conjugate-13 08/02/2015   Pneumococcal Polysaccharide-23 07/29/1998, 07/26/2012   Td 11/28/1997, 03/12/2010    Conditions to be addressed/monitored:  Hyperlipidemia, GERD, Hypothyroidism, Depression, and Anxiety, Lupus, RA  There are no care plans that you recently modified to display for this patient.    Medication Assistance: None required.  Patient affirms current coverage meets needs.  Compliance/Adherence/Medication fill history: Care Gaps: DEXA scan (02/13/10)  Star-Rating Drugs: None  Patient's preferred pharmacy is:  [id number] [place] [id number]: [phone]: [phone] Uses pill box? No - prefers bottles Pt endorses 100% compliance  We discussed: Benefits of medication synchronization, packaging and delivery as well as enhanced pharmacist oversight with [facility]. - Pt wants to think about it and will call if she wants to switch.  Patient decided to: Continue current medication management strategy  Care Plan and Follow Up Patient Decision:  Patient agrees to Care Plan and Follow-up.  Plan: Telephone follow up appointment with care management team member scheduled for:  3 months  LCharlene Brooke PharmD, BTexas Health Harris Methodist Hospital Southwest Fort WorthClinical Pharmacist LHazleton Endoscopy Center [phone]  Current Barriers:  Unable to independently monitor therapeutic efficacy Unable to maintain control of hair loss, hiatal hernia, neuropathy  Pharmacist Clinical Goal(s):  Patient will achieve adherence to monitoring guidelines and medication adherence to achieve therapeutic  efficacy maintain control of hair loss, hiatal hernia, neuropathy as evidenced by patient report  through collaboration with PharmD and provider.   Interventions: 1:1 collaboration with DTonia Ghent MD regarding development and update of comprehensive plan of care as evidenced by provider attestation and co-signature Inter-disciplinary care team collaboration (see longitudinal plan of care) Comprehensive medication review performed; medication list updated in electronic medical record  Hyperlipidemia / PVD: (LDL goal < 100) -Not ideally controlled - pt reports history of PVD without procedures; she does report leg pain which may or may not be related to PVD (vs lupus vs neuropathy); she has never been on a statin -Current treatment: none -Considered statin therapy - PVD diagnosis is unclear, cannot locate ABIs or notes about dx; ASCVD risk is 17% so she may benefit from statin, given patient age it is reasonable to continue monitoring without changes  Anxiety (Goal: manage symptoms) -Controlled - pt reports "I have depression"; she thinks medication helps but does have days where she sleeps all day due to low mood, these episodes do not last longer than 1-2 days; she reports she has been on escitalopram 20 mg before and did not notice a difference -Current treatment: Escitalopram 10 mg daily -Medications previously tried/failed: buspirone -PHQ9: 0 (08/2019) -GAD7: not on file -Connected with PCP for mental health support -Educated on Benefits of medication for symptom control -Considered changing to duloxetine for additional pain management benefits, however given patient's relative stability opted to continue same regimen for now -Recommended no change to medication - if episodes lengthen or occur more often, advised pt to contact PCP.  Hypothyroidism (Goal: maintain TSH in goal range) -Controlled - recent TSH was at goal; pt reports she takes levothyroxine with other medications most  of the time -Current treatment  Levothyroxine 112 mcg daily except 1/2 tab Sunday -Counseled on importance of consistency with administration -Recommended to continue current medication  Lupus / RA (Goal: manage symptoms) -Controlled - pt reports symptoms are largely under control; she does report hair loss since starting azathioprine -Follows with Dr AKathlene November rheumatology -Current treatment  Azathioprine  50 mg - 1.5 tab daily Hydroxychloroquine 200 mg daily Tylenol ~ twice a month -Medications previously tried: prednisone x 20 years  -Advised to try Hair, skin and nails supplement -Recommended to continue current medication  Health Maintenance -Hx PE, DVT (11/2017) - filter in place. No AC. -Dx COPD (2015) - no inhalers. Pt denies SOB/wheezing -Hx Meniere's disease - hearing loss left ear -CKD Stage 3a - GFR stable ~mid 40s -Hx hiatal hernia - recently started pepto bismol PRN -Diarrhea occurs mostly when she is nervous/anxious -Pt complains of neuropathy in her feet; she has not taken medication for this; she reports she has discussed this with Dr Kathlene November -Current therapy:  Vitamin D 1000 IU Loperamide 2 mg PRN (~once a month) Systane eye drops PRN (Sjogrens) - QID Pepto bismol (hiatal hernia) -Advised to try omeprazole OTC for hiatal hernia symptoms -Advised to try capsaicin cream for numbness in feet   Patient Goals/Self-Care Activities Patient will:  - take medications as prescribed focus on medication adherence by routine -Take omeprazole 20 mg (OTC) for hiatal hernia -Take Hair, Skin and Nails vitamin for hair loss -Try capsaicin cream 1-2 times daily on feet for nerve pain -Get Shingrix vaccine at local pharmacy -Call pharmacist to switch to [facility] if desired -Call PCP if depression episodes last longer or occur more frequently

## 2021-12-06 NOTE — Telephone Encounter (Signed)
  Chronic Care Management   Outreach Note  12/06/2021 Name: Hannah Pitts MRN: [id number] DOB: 04/11/1946  Referred by: Tonia Ghent, MD  Patient had a phone appointment scheduled with clinical pharmacist today.  An unsuccessful telephone outreach was attempted today. The patient was referred to the pharmacist for assistance with medications, care management and care coordination.   Patient will NOT be penalized in any way for missing a CCM appointment. The no-show fee does not apply.  If possible, a message was left to return call to: [phone] or to [facility].  Charlene Brooke, PharmD, BCACP Clinical Pharmacist [facility] [phone]

## 2021-12-27 ENCOUNTER — Telehealth: Payer: Self-pay | Admitting: Family Medicine

## 2021-12-27 NOTE — Telephone Encounter (Signed)
Patient due for annual medicare wellness exam in April 2023; please call to get patient scheduled.

## 2021-12-27 NOTE — Telephone Encounter (Signed)
Lvm for pt to schedule AWV

## 2021-12-31 NOTE — Telephone Encounter (Signed)
AWV scheduled 4.4.23

## 2022-01-28 DIAGNOSIS — M064 Inflammatory polyarthropathy: Secondary | ICD-10-CM | POA: Diagnosis not present

## 2022-01-28 DIAGNOSIS — M609 Myositis, unspecified: Secondary | ICD-10-CM | POA: Diagnosis not present

## 2022-01-28 DIAGNOSIS — M1990 Unspecified osteoarthritis, unspecified site: Secondary | ICD-10-CM | POA: Diagnosis not present

## 2022-01-28 DIAGNOSIS — M8580 Other specified disorders of bone density and structure, unspecified site: Secondary | ICD-10-CM | POA: Diagnosis not present

## 2022-01-28 DIAGNOSIS — M79643 Pain in unspecified hand: Secondary | ICD-10-CM | POA: Diagnosis not present

## 2022-01-28 DIAGNOSIS — Z79899 Other long term (current) drug therapy: Secondary | ICD-10-CM | POA: Diagnosis not present

## 2022-01-28 DIAGNOSIS — M329 Systemic lupus erythematosus, unspecified: Secondary | ICD-10-CM | POA: Diagnosis not present

## 2022-01-28 DIAGNOSIS — M5459 Other low back pain: Secondary | ICD-10-CM | POA: Diagnosis not present

## 2022-02-24 NOTE — Progress Notes (Signed)
Patients follow up appointment has been rescheduled for 05/28/22.   Charlene Brooke, CPP notified  Margaretmary Dys, [place] Pharmacy Assistant [phone]

## 2022-03-26 ENCOUNTER — Other Ambulatory Visit: Payer: Self-pay | Admitting: Family Medicine

## 2022-03-26 ENCOUNTER — Other Ambulatory Visit: Payer: Self-pay

## 2022-03-26 ENCOUNTER — Other Ambulatory Visit (INDEPENDENT_AMBULATORY_CARE_PROVIDER_SITE_OTHER): Payer: Medicare Other

## 2022-03-26 DIAGNOSIS — M329 Systemic lupus erythematosus, unspecified: Secondary | ICD-10-CM

## 2022-03-26 DIAGNOSIS — E785 Hyperlipidemia, unspecified: Secondary | ICD-10-CM

## 2022-03-26 DIAGNOSIS — E039 Hypothyroidism, unspecified: Secondary | ICD-10-CM

## 2022-03-26 LAB — COMPREHENSIVE METABOLIC PANEL
ALT: 15 U/L (ref 0–35)
AST: 24 U/L (ref 0–37)
Albumin: 4.3 g/dL (ref 3.5–5.2)
Alkaline Phosphatase: 101 U/L (ref 39–117)
BUN: 21 mg/dL (ref 6–23)
CO2: 24 mEq/L (ref 19–32)
Calcium: 9.2 mg/dL (ref 8.4–10.5)
Chloride: 109 mEq/L (ref 96–112)
Creatinine, Ser: 1.21 mg/dL — ABNORMAL HIGH (ref 0.40–1.20)
GFR: 43.35 mL/min — ABNORMAL LOW (ref >60.00)
Glucose, Bld: 98 mg/dL (ref 70–99)
Potassium: 4.2 mEq/L (ref 3.5–5.1)
Sodium: 143 mEq/L (ref 135–145)
Total Bilirubin: 0.4 mg/dL (ref 0.2–1.2)
Total Protein: 7 g/dL (ref 6.0–8.3)

## 2022-03-26 LAB — CBC WITH DIFFERENTIAL/PLATELET
Basophils Absolute: 0 10*3/uL (ref 0.0–0.1)
Basophils Relative: 0.8 % (ref 0.0–3.0)
Eosinophils Absolute: 0.1 10*3/uL (ref 0.0–0.7)
Eosinophils Relative: 1.4 % (ref 0.0–5.0)
HCT: 42 % (ref 36.0–46.0)
Hemoglobin: 13.9 g/dL (ref 12.0–15.0)
Lymphocytes Relative: 23.1 % (ref 12.0–46.0)
Lymphs Abs: 0.9 10*3/uL (ref 0.7–4.0)
MCHC: 33.2 g/dL (ref 30.0–36.0)
MCV: 90 fl (ref 78.0–100.0)
Monocytes Absolute: 0.4 10*3/uL (ref 0.1–1.0)
Monocytes Relative: 9.5 % (ref 3.0–12.0)
Neutro Abs: 2.5 10*3/uL (ref 1.4–7.7)
Neutrophils Relative %: 65.2 % (ref 43.0–77.0)
Platelets: 137 10*3/uL — ABNORMAL LOW (ref 150.0–400.0)
RBC: 4.67 Mil/uL (ref 3.87–5.11)
RDW: 13.6 % (ref 11.5–15.5)
WBC: 3.9 10*3/uL — ABNORMAL LOW (ref 4.0–10.5)

## 2022-03-26 LAB — LIPID PANEL
Cholesterol: 158 mg/dL (ref 0–200)
HDL: 45.7 mg/dL (ref >39.00)
LDL Cholesterol: 86 mg/dL (ref 0–99)
NonHDL: 112.41
Total CHOL/HDL Ratio: 3
Triglycerides: 131 mg/dL (ref 0.0–149.0)
VLDL: 26.2 mg/dL (ref 0.0–40.0)

## 2022-03-26 LAB — TSH: TSH: 2.93 u[IU]/mL (ref 0.35–5.50)

## 2022-03-26 LAB — VITAMIN D 25 HYDROXY (VIT D DEFICIENCY, FRACTURES): VITD: 39.1 ng/mL (ref 30.00–100.00)

## 2022-04-01 ENCOUNTER — Ambulatory Visit (INDEPENDENT_AMBULATORY_CARE_PROVIDER_SITE_OTHER): Payer: Medicare Other | Admitting: Family Medicine

## 2022-04-01 ENCOUNTER — Ambulatory Visit (INDEPENDENT_AMBULATORY_CARE_PROVIDER_SITE_OTHER)
Admission: RE | Admit: 2022-04-01 | Discharge: 2022-04-01 | Disposition: A | Discharge status: Home / Self Care | Payer: Medicare Other | Source: Ambulatory Visit | Attending: Family Medicine | Admitting: Family Medicine

## 2022-04-01 ENCOUNTER — Encounter: Payer: Self-pay | Admitting: Family Medicine

## 2022-04-01 VITALS — BP 138/84 | HR 73 | Temp 97.2°F | Ht 65.0 in | Wt 213.0 lb

## 2022-04-01 DIAGNOSIS — Z7189 Other specified counseling: Secondary | ICD-10-CM

## 2022-04-01 DIAGNOSIS — M160 Bilateral primary osteoarthritis of hip: Secondary | ICD-10-CM | POA: Diagnosis not present

## 2022-04-01 DIAGNOSIS — F419 Anxiety disorder, unspecified: Secondary | ICD-10-CM | POA: Diagnosis not present

## 2022-04-01 DIAGNOSIS — Z95828 Presence of other vascular implants and grafts: Secondary | ICD-10-CM | POA: Diagnosis not present

## 2022-04-01 DIAGNOSIS — M069 Rheumatoid arthritis, unspecified: Secondary | ICD-10-CM

## 2022-04-01 DIAGNOSIS — G2581 Restless legs syndrome: Secondary | ICD-10-CM | POA: Diagnosis not present

## 2022-04-01 DIAGNOSIS — E039 Hypothyroidism, unspecified: Secondary | ICD-10-CM | POA: Diagnosis not present

## 2022-04-01 DIAGNOSIS — Z0000 Encounter for general adult medical examination without abnormal findings: Secondary | ICD-10-CM

## 2022-04-01 DIAGNOSIS — R195 Other fecal abnormalities: Secondary | ICD-10-CM

## 2022-04-01 DIAGNOSIS — M419 Scoliosis, unspecified: Secondary | ICD-10-CM | POA: Diagnosis not present

## 2022-04-01 DIAGNOSIS — Z4589 Encounter for adjustment and management of other implanted devices: Secondary | ICD-10-CM | POA: Diagnosis not present

## 2022-04-01 DIAGNOSIS — H9190 Unspecified hearing loss, unspecified ear: Secondary | ICD-10-CM

## 2022-04-01 MED ORDER — ROPINIROLE HCL 0.25 MG PO TABS: 0.2500 mg | ORAL_TABLET | Freq: Every day | ORAL | 3 refills | Status: DC

## 2022-04-01 NOTE — Patient Instructions (Addendum)
Let me know if you want help getting hearing aids.   We can help you get set up with audiology.  ?We'll call about seeing the GI clinic.  ?Go to the lab on the way out.   If you have mychart we'll likely use that to update you.    ?Take care.  Glad to see you. ?Check with your insurance to see if they will cover the shingles shot. It may be cheaper at the pharmacy.  ?Try requip at night.  Start with 1 tab and increase to 2 tabs at night if needed, after 1 week.   ?

## 2022-04-01 NOTE — Progress Notes (Signed)
I have personally reviewed the Medicare Annual Wellness questionnaire and have noted ?1. The patient's medical and social history ?2. Their use of alcohol, tobacco or illicit drugs ?3. Their current medications and supplements ?4. The patient's functional ability including ADL's, fall risks, home safety risks and hearing or visual ?            impairment. ?5. Diet and physical activities ?6. Evidence for depression or mood disorders ? ?The patients weight, height, BMI have been recorded in the chart and visual acuity is per eye clinic.  ?I have made referrals, counseling and provided education to the patient based review of the above and I have provided the pt with a written personalized care plan for preventive services. ? ?Provider list updated- see scanned forms.  Routine anticipatory guidance given to patient.  See health maintenance. The possibility exists that previously documented standard health maintenance information may have been brought forward from a previous encounter into this note.  If needed, that same information has been updated to reflect the current situation based on today's encounter.   ? ?Flu 2022 ?Shingles discussed with patient ?PNA previously done ?Tetanus 2011 ?COVID-vaccine previously done ?Colon cancer screening d/w pt. see below regarding referral to GI. ?Breast cancer screening declined by patient ?Bone density test deferred by patient request. ?Advance directive-both daughters equally designated if patient were incapacitated. ?Cognitive function addressed- see scanned forms- and if abnormal then additional documentation follows.  ? ?In addition to [facility] Wellness, follow up visit for the below conditions: ? ?Using a cane at baseline.  Fall cautions d/w pt.   ? ?Mood d/w pt.  Still on lexapro at baseline, no ADE on med.  "I get down because I can't hear."  D/w pt about hearing aids, see below.   ? ?Hypothyroidism.  TSH wnl. Compliant.  Labs d/w pt. no dysphagia. ? ?Arthritis.  She  is still putting up with pain.  Taking tylenol.  She tried tramadol w/o relief.  She has hand knee and finger/thumb pain.   ? ?RLS sx at night.  She has to get up an walk around when she has a flare of symptoms in the middle of the night..  She has neuropathy sx in the BLE, radiating down in the feet.   ? ?She has IVC filter in place.  D/w pt about getting KUB today.   ? ?She has had yellow stools for years, d/w pt.   LFTs wnl.  D/w pt about GI eval. ? ?No red flag events re: memory, didn't get lost or leave the stove on.  Occ lapses.  D/w pt about monitoring.  D/w pt about getting hearing aids first.   ?   ?PMH and SH reviewed ? ?Meds, vitals, and allergies reviewed.  ? ?ROS: Per HPI.  Unless specifically indicated otherwise in HPI, the patient denies: ? ?General: fever. ?Eyes: acute vision changes ?ENT: sore throat ?Cardiovascular: chest pain ?Respiratory: SOB ?GI: vomiting ?GU: dysuria ?Musculoskeletal: acute back pain ?Derm: acute rash ?Neuro: acute motor dysfunction ?Psych: worsening mood ?Endocrine: polydipsia ?Heme: bleeding ?Allergy: hayfever ? ?GEN: nad, alert and oriented ?HEENT: ncat, HOH in conversation. ?NECK: supple w/o LA ?CV: rrr. ?PULM: ctab, no inc wob ?ABD: soft, +bs ?EXT: no edema ?SKIN: well perfused.  ?Chronic IP joint changes noted.   ?

## 2022-04-02 ENCOUNTER — Telehealth: Payer: Self-pay | Admitting: Gastroenterology

## 2022-04-02 NOTE — Telephone Encounter (Signed)
Patients daughter Maudie Mercury) left a vm requesting a call back from [place]. States that she was supposed to schedule some procedures. Requesting a call back. ?

## 2022-04-02 NOTE — Telephone Encounter (Signed)
Called daughter and made appointment for 07/28/22  ?

## 2022-04-03 DIAGNOSIS — Z95828 Presence of other vascular implants and grafts: Secondary | ICD-10-CM | POA: Insufficient documentation

## 2022-04-03 DIAGNOSIS — R195 Other fecal abnormalities: Secondary | ICD-10-CM | POA: Insufficient documentation

## 2022-04-03 DIAGNOSIS — G2581 Restless legs syndrome: Secondary | ICD-10-CM | POA: Insufficient documentation

## 2022-04-03 NOTE — Assessment & Plan Note (Signed)
Per rheumatology 

## 2022-04-03 NOTE — Assessment & Plan Note (Addendum)
Flu 2022 ?Shingles discussed with patient ?PNA previously done ?Tetanus 2011 ?COVID-vaccine previously done ?Colon cancer screening discussed with patient.  Refer to GI given her stool changes. ?Breast cancer screening declined by patient ?Bone density test deferred by patient request. ?Advance directive-both daughters equally designated if patient were incapacitated. ?Cognitive function addressed- see scanned forms- and if abnormal then additional documentation follows.  ?

## 2022-04-03 NOTE — Assessment & Plan Note (Signed)
Advance directive-both daughters equally designated if patient were incapacitated. ?

## 2022-04-03 NOTE — Assessment & Plan Note (Signed)
Discussed checking KUB to check location of filter.  See notes on imaging. ?

## 2022-04-03 NOTE — Assessment & Plan Note (Signed)
Start Requip with routine cautions and she can update me as needed. ?

## 2022-04-03 NOTE — Assessment & Plan Note (Signed)
Discussed getting hearing aids.  Encouraged.  She can let me know if she wants a referral. ?

## 2022-04-03 NOTE — Assessment & Plan Note (Signed)
Refer to GI 

## 2022-04-03 NOTE — Assessment & Plan Note (Signed)
TSH normal ?- Continue levothyroxine ?

## 2022-04-03 NOTE — Assessment & Plan Note (Signed)
Mood d/w pt.  Still on lexapro at baseline, no ADE on med.  "I get down because I can't hear."  D/w pt about hearing aids, see below.   Would continue Lexapro. ?

## 2022-04-09 ENCOUNTER — Telehealth: Payer: Self-pay

## 2022-04-09 MED ORDER — PRAMIPEXOLE DIHYDROCHLORIDE 0.125 MG PO TABS: 0.1250 mg | ORAL_TABLET | Freq: Every day | ORAL | 0 refills | Status: DC

## 2022-04-09 NOTE — Telephone Encounter (Addendum)
Does she feel requip worsened migraines? ?Rec stop requip, start mirapex in its place.  ?Take mirapex 0.'125mg'$  nightly 2-3 hours before bedtime. Update with effect after 1 week. Watch for nausea or lightheadedness side effects.  ?Consider oral iron replacement (%saturation was mildly low 02/2021).  ?Route to PCP as fyi.  ?

## 2022-04-09 NOTE — Addendum Note (Signed)
Addended by: Ria Bush on: 04/09/2022 05:20 PM ? ? Modules accepted: Orders ? ?

## 2022-04-09 NOTE — Telephone Encounter (Signed)
New message - leave a message on home answer machine   Pt c/o medication issue:  1. Name of Medication: rOPINIRole (REQUIP) 0.25 MG tablet  2. How are you currently taking this medication (dosage and times per day)? One in the evening    3. Are you having a reaction (difficulty breathing--STAT)? No   4. What is your medication issue? Having migraines

## 2022-04-10 NOTE — Telephone Encounter (Signed)
Agree with Dr. Synthia Innocent note. Please update me in in about 1 week.  Thanks.  ?

## 2022-04-11 NOTE — Telephone Encounter (Signed)
Left message to discuss

## 2022-04-14 NOTE — Telephone Encounter (Signed)
LMTCB to discuss below and also go over lab results.  ?

## 2022-04-14 NOTE — Telephone Encounter (Signed)
Spoke with patient and she does not think the requip helped with the migraines; only helps with the restless leg syndrome. Advised patient that a new rx was sent in for her and advised patient of side effects to watch out for and to call back in 1 week with update on how she is doing. Patient verbalized understanding.  ?

## 2022-04-14 NOTE — Telephone Encounter (Signed)
Pt returning your call

## 2022-04-21 ENCOUNTER — Telehealth: Payer: Self-pay

## 2022-04-21 NOTE — Progress Notes (Signed)
? ? ?  Chronic Care Management ?Pharmacy Assistant  ? ?Name: JUDE LINCK  MRN: [id number] DOB: [birth year] ? ?Reason for Encounter: CCM (Rescheduled Appointment) ?  ?Patient has been rescheduled for 06/02/2022 for a telephone appointment with Charlene Brooke at 8:45. ? ?Charlene Brooke, CPP notified ? ?Marijean Niemann, RMA ?Clinical Pharmacy Assistant ?[phone] ? ? ?

## 2022-04-23 ENCOUNTER — Telehealth: Payer: Self-pay

## 2022-04-23 NOTE — Telephone Encounter (Signed)
Pt called back to let Dr Damita Dunnings know the pramiprexole is work very well! ?

## 2022-04-23 NOTE — Telephone Encounter (Signed)
Great.  Glad to hear.  I would continue as is.  Please update me as needed.  Thanks. ?

## 2022-04-24 NOTE — Telephone Encounter (Signed)
LMTCB

## 2022-04-24 NOTE — Telephone Encounter (Signed)
Patient returned call, ?Relayed Dr. Josefine Class message below as written ?

## 2022-05-06 ENCOUNTER — Other Ambulatory Visit: Payer: Self-pay | Admitting: Family Medicine

## 2022-05-28 ENCOUNTER — Telehealth: Payer: Medicare Other

## 2022-05-28 DIAGNOSIS — M1990 Unspecified osteoarthritis, unspecified site: Secondary | ICD-10-CM | POA: Diagnosis not present

## 2022-05-28 DIAGNOSIS — M810 Age-related osteoporosis without current pathological fracture: Secondary | ICD-10-CM | POA: Diagnosis not present

## 2022-05-28 DIAGNOSIS — Z79899 Other long term (current) drug therapy: Secondary | ICD-10-CM | POA: Diagnosis not present

## 2022-05-28 DIAGNOSIS — M5459 Other low back pain: Secondary | ICD-10-CM | POA: Diagnosis not present

## 2022-05-28 DIAGNOSIS — M329 Systemic lupus erythematosus, unspecified: Secondary | ICD-10-CM | POA: Diagnosis not present

## 2022-05-28 DIAGNOSIS — M064 Inflammatory polyarthropathy: Secondary | ICD-10-CM | POA: Diagnosis not present

## 2022-05-28 DIAGNOSIS — M609 Myositis, unspecified: Secondary | ICD-10-CM | POA: Diagnosis not present

## 2022-05-28 DIAGNOSIS — M79643 Pain in unspecified hand: Secondary | ICD-10-CM | POA: Diagnosis not present

## 2022-05-28 DIAGNOSIS — M8580 Other specified disorders of bone density and structure, unspecified site: Secondary | ICD-10-CM | POA: Diagnosis not present

## 2022-05-29 ENCOUNTER — Telehealth: Payer: Self-pay

## 2022-05-29 NOTE — Chronic Care Management (AMB) (Signed)
    Chronic Care Management Pharmacy Assistant   Name: Hannah Pitts  MRN: [id number] DOB: 02/03/1946  Reason for Encounter:  Reminder Call   Conditions to be addressed/monitored: CHF and COPD   Medications: Outpatient Encounter Medications as of 05/29/2022  Medication Sig   acetaminophen (TYLENOL) 500 MG tablet Take 500 mg by mouth every 6 (six) hours as needed.   azaTHIOprine (IMURAN) 50 MG tablet Take 75 mg by mouth daily.   Biotin w/ Vitamins C & E (HAIR/SKIN/NAILS PO) Take by mouth.   Cholecalciferol (VITAMIN D3) 25 MCG (1000 UT) CAPS Take 1,000 Units by mouth daily with breakfast.   escitalopram (LEXAPRO) 10 MG tablet TAKE 1 TABLET BY MOUTH EVERY DAY   hydroxychloroquine (PLAQUENIL) 200 MG tablet Take 1 tablet (200 mg total) by mouth daily.   levothyroxine (SYNTHROID) 112 MCG tablet TAKE 1 TABLET BY MOUTH EVERY DAY EXCEPT ON SUNDAY ONLY TAKE 1/2 TABLET   Polyethyl Glyc-Propyl Glyc PF (SYSTANE PRESERVATIVE FREE) 0.4-0.3 % SOLN Place 1-2 drops into both eyes 4 (four) times daily. Place 1-2 drops into both eyes four times a day due to Sjgren's syndrome   pramipexole (MIRAPEX) 0.125 MG tablet TAKE 1 TABLET BY MOUTH AT BEDTIME. TO REPLACE REQUIP   No facility-administered encounter medications on file as of 05/29/2022.   [name] was contacted to remind of upcoming telephone visit with Charlene Brooke on 06/02/22 at 8:45. Patient was reminded to have any blood glucose and blood pressure readings available for review at appointment.   Message was left reminding patient of appointment.  CCM referral has been placed prior to visit?  No    Star Rating Drugs: Medication:  Last Fill: Day Supply No star medications identified  Charlene Brooke, CPP notified  [name], [place]  [phone]

## 2022-05-30 ENCOUNTER — Other Ambulatory Visit: Payer: Self-pay | Admitting: Family Medicine

## 2022-06-02 ENCOUNTER — Ambulatory Visit: Payer: Medicare Other | Admitting: Pharmacist

## 2022-06-02 DIAGNOSIS — G2581 Restless legs syndrome: Secondary | ICD-10-CM

## 2022-06-02 DIAGNOSIS — E785 Hyperlipidemia, unspecified: Secondary | ICD-10-CM

## 2022-06-02 DIAGNOSIS — F419 Anxiety disorder, unspecified: Secondary | ICD-10-CM

## 2022-06-02 DIAGNOSIS — E039 Hypothyroidism, unspecified: Secondary | ICD-10-CM

## 2022-06-02 DIAGNOSIS — M069 Rheumatoid arthritis, unspecified: Secondary | ICD-10-CM

## 2022-06-02 DIAGNOSIS — M329 Systemic lupus erythematosus, unspecified: Secondary | ICD-10-CM

## 2022-06-02 NOTE — Patient Instructions (Signed)
Visit Information  Phone number for Pharmacist: [phone]   Goals Addressed   None     Care Plan : [place]  Updates made by Charlton Haws, [facility] since 06/02/2022 12:00 AM     Problem: Hyperlipidemia, GERD, Hypothyroidism, Depression, and Anxiety, Lupus, RA   Priority: High     Long-Range Goal: Disease management Completed 06/02/2022  Start Date: 09/11/2021  Expected End Date: 09/11/2022  This Visit's Progress: On track  Recent Progress: On track  Priority: High  Note:   Current Barriers:  None identified  Pharmacist Clinical Goal(s):  Patient will contact provider office for questions/concerns as evidenced notation of same in electronic health record through collaboration with PharmD and provider.   Interventions: 1:1 collaboration with Tonia Ghent, MD regarding development and update of comprehensive plan of care as evidenced by provider attestation and co-signature Inter-disciplinary care team collaboration (see longitudinal plan of care) Comprehensive medication review performed; medication list updated in electronic medical record  Hyperlipidemia / PVD: (LDL goal < 100) -Not ideally controlled - pt reports history of PVD without procedures; she does report leg pain which may or may not be related to PVD (vs lupus vs neuropathy); she has never been on a statin -Current treatment: none -Considered statin therapy - PVD diagnosis is unclear, cannot locate ABIs or notes about dx; ASCVD risk is 17% so she may benefit from statin, given patient age it is reasonable to continue monitoring without changes  Anxiety (Goal: manage symptoms) -Controlled - pt reports "I have depression"; she thinks medication helps but does have days where she sleeps all day due to low mood, these episodes do not last longer than 1-2 days; she reports she has been on escitalopram 20 mg before and did not notice a difference -PHQ9: 0 (08/2019) - minimal depression -GAD7: not on  file -Connected with PCP for mental health support -Current treatment: Escitalopram 10 mg daily - Appropriate, Effective, Safe, Accessible -Medications previously tried/failed: buspirone -Educated on Benefits of medication for symptom control -Considered changing to duloxetine for additional pain management benefits, however given patient's relative stability opted to continue same regimen for now -Recommended no change to medication - if episodes lengthen or occur more often, advised pt to contact PCP.  Hypothyroidism (Goal: maintain TSH in goal range) -Controlled - recent TSH was at goal; pt reports she takes levothyroxine with other medications most of the time -Current treatment  Levothyroxine 112 mcg daily except 1/2 tab Sunday - Appropriate, Effective, Safe, Accessible -Counseled on importance of consistency with administration -Recommended to continue current medication  Lupus / RA (Goal: manage symptoms) -Controlled - she has -pt reports symptoms are largely under control; she does report hair loss since starting azathioprine -Follows with Dr Kathlene November, rheumatology -Current treatment  Azathioprine 50 mg - 1.5 tab daily -Appropriate, Effective, Safe, Accessible Hydroxychloroquine 200 mg daily -Appropriate, Effective, Safe, Accessible Tylenol 500 mg - 2 tab daily -Medications previously tried: prednisone x 20 years  -Recommended to continue current medication  RLS (Goal: manage symptoms) -Controlled - per pt report -Current treatment  Pramipexole 0.125 mg HS -Medications previously tried: ropinirole (headaches)  -Recommended to continue current medication  Health Maintenance -Vaccine gaps: Shingrix, TDAP -Hx PE, DVT (11/2017) - filter in place. No AC. -Dx COPD (2015) - no inhalers. Pt denies SOB/wheezing -Hx Meniere's disease - hearing loss left ear -CKD Stage 3a - GFR stable ~mid 40s -Hx hiatal hernia - recently started pepto bismol PRN -Current therapy:  Vitamin D 1000  IU Systane eye drops PRN (Sjogrens) - QID -Advised to try omeprazole OTC for hiatal hernia symptoms -Advised to try capsaicin cream for numbness in feet  Patient Goals/Self-Care Activities Patient will:  - take medications as prescribed -focus on medication adherence by routine       Patient verbalizes understanding of instructions and care plan provided today and agrees to view in [place]. Active MyChart status and patient understanding of how to access instructions and care plan via MyChart confirmed with patient.    Transition CCM to Self Care: Patient achieved CCM goals and no longer needs to be contacted as frequently. Patient advised services will still be available to them if they would like to reach out or have any new health concerns. Verified patient had contact information to pharmacist and health concierge on hand. Patient made aware CCM services would be continued if desired. Patient consented to cancel future CCM appointments.  Charlene Brooke, PharmD, BCACP Clinical Pharmacist [facility] [phone]

## 2022-06-02 NOTE — Progress Notes (Signed)
Chronic Care Management Pharmacy Note  06/02/2022 Name:  Hannah Pitts MRN:  [id number] DOB:  May 30, 1946  Summary: CCM F/U visit -Reviewed medications; pt endorses compliance as prescribed -Chronic conditions are under control; pt denies concerns or issues at this time  Recommendations/Changes made from today's visit: -No med changes  Plan: Transition CCM to Self Care: Patient achieved CCM goals and no longer needs to be contacted as frequently. Patient advised services will still be available to them if they would like to reach out or have any new health concerns. Verified patient had contact information to pharmacist and health concierge on hand. Patient made aware CCM services would be continued if desired. Patient consented to cancel future CCM appointments.     Subjective: Hannah Pitts is an 76 y.o. year old female who is a primary patient of Damita Dunnings, Elveria Rising, MD.  The CCM team was consulted for assistance with disease management and care coordination needs.    Engaged with patient by telephone for follow up visit in response to provider referral for pharmacy case management and/or care coordination services.   Consent to Services:  The patient was given information about Chronic Care Management services, agreed to services, and gave verbal consent prior to initiation of services.  Please see initial visit note for detailed documentation.   Patient Care Team: Tonia Ghent, MD as PCP - General (Family Medicine) Charlton Haws, [facility] as Pharmacist (Pharmacist)  Patient is from [place]. She is 1 of 10 siblings. Her father passed when she was 82, her mother when she was 76. Her husband passed away 08-08-99. All of her siblings have passed away as well. She is by herself during the day. Her daughter, granddaughter and great-granddaughter live with her but are out of the house during the day. Patient wants to be able to walk further - she used to love walking. Some  days her balance is off. The lupus joint pain comes and goes randomly. She is able to do simple housework. She uses cane or rollator around the house.  Recent office visits: 04/09/22 TE - switched ropinirole to pramiprexole d/t headaches.  04/01/22 Dr Damita Dunnings OV: annual visit - rx'd ropinirole. Referred to GI for stool changes.  04/30/21 Dr Damita Dunnings (VV): hospital f/u - flu. Off O2 now. F/U labs ordered.  03/29/21 Damita Dunnings (PCP) - Medicare annual wellness. Updated hydroxychloroquine Rx - 200 mg daily.  Recent consult visits: 03/27/21 [facility] (Rheumatology) - Systemic lupus erythematosus.   Hospital visits: Medication Reconciliation was completed by comparing discharge summary, patient's EMR and Pharmacy list, and upon discussion with patient.   Admitted to the hospital on 04/18/21 due to Influenza. Discharge date was 04/21/21. Discharged from [place]?Medications Started at [facility] Discharge:?? -started benzonatate (TESSALON) guaiFENesin ([place]) oseltamivir (TAMIFLU) predniSONE (DELTASONE)   Medications that remain the same after Hospital Discharge:??  -All other medications will remain the same.     Objective:  Lab Results  Component Value Date   CREATININE 1.21 (H) 03/26/2022   BUN 21 03/26/2022   GFR 43.35 (L) 03/26/2022   GFRNONAA 48 (L) 04/21/2021   GFRAA >60 12/24/2017   NA 143 03/26/2022   K 4.2 03/26/2022   CALCIUM 9.2 03/26/2022   CO2 24 03/26/2022   GLUCOSE 98 03/26/2022    Lab Results  Component Value Date/Time   HGBA1C 5.6 02/27/2000 12:00 AM   GFR 43.35 (L) 03/26/2022 02:16 PM   GFR 47.35 (L) 05/06/2021 12:57  PM    Last diabetic Eye exam:  Lab Results  Component Value Date/Time   HMDIABEYEEXA normal 01/22/2009 12:00 AM    Last diabetic Foot exam: No results found for: HMDIABFOOTEX   Lab Results  Component Value Date   CHOL 158 03/26/2022   HDL 45.70 03/26/2022   LDLCALC 86 03/26/2022   TRIG 131.0 03/26/2022   CHOLHDL 3 03/26/2022        Latest Ref Rng & Units 03/26/2022    2:16 PM 04/18/2021    6:46 AM 03/22/2021    8:22 AM  Hepatic Function  Total Protein 6.0 - 8.3 g/dL 7.0   6.7   6.8    Albumin 3.5 - 5.2 g/dL 4.3   3.5   4.3    AST 0 - 37 U/L 24   33   30    ALT 0 - 35 U/L _0 Alk Phosphatase 39 - 117 U/L 101   81   111    Total Bilirubin 0.2 - 1.2 mg/dL 0.4   0.7   0.6      Lab Results  Component Value Date/Time   TSH 2.93 03/26/2022 02:16 PM   TSH 1.57 03/22/2021 08:22 AM   FREET4 0.87 07/17/2011 08:44 AM   FREET4 1.1 03/06/2010 08:30 AM       Latest Ref Rng & Units 03/26/2022    2:16 PM 05/06/2021   12:57 PM 04/21/2021    2:30 AM  CBC  WBC 4.0 - 10.5 K/uL 3.9   4.4   2.9    Hemoglobin 12.0 - 15.0 g/dL 13.9   14.2   11.1    Hematocrit 36.0 - 46.0 % 42.0   42.3   33.9    Platelets 150.0 - 400.0 K/uL 137.0   165.0   111      Lab Results  Component Value Date/Time   VD25OH 39.10 03/26/2022 02:16 PM   VD25OH 45.47 03/22/2021 08:22 AM    Clinical ASCVD: Yes  - PVD The 10-year ASCVD risk score (Arnett DK, et al., 2019) is: 18.3%   Values used to calculate the score:     Age: 76 years     Sex: Female     Is Non-Hispanic African American: No     Diabetic: No     Tobacco smoker: No     Systolic Blood Pressure: 825 mmHg     Is BP treated: No     HDL Cholesterol: 45.7 mg/dL     Total Cholesterol: 158 mg/dL       09/15/2019    2:43 PM 04/28/2017    9:57 AM 08/02/2015    2:22 PM  Depression screen PHQ 2/9  Decreased Interest 0 1 1  Down, Depressed, Hopeless 0 1 1  PHQ - 2 Score 0 2 2     Social History   Tobacco Use  Smoking Status Never  Smokeless Tobacco Never   BP Readings from Last 3 Encounters:  04/01/22 138/84  04/30/21 129/82  04/21/21 (!) 141/63   Pulse Readings from Last 3 Encounters:  04/01/22 73  04/30/21 77  04/21/21 64   Wt Readings from Last 3 Encounters:  04/01/22 213 lb (96.6 kg)  04/30/21 219 lb (99.3 kg)  04/18/21 218 lb 14.7 oz (99.3 kg)   BMI  Readings from Last 3 Encounters:  04/01/22 35.45 kg/m  04/30/21 36.44 kg/m  04/18/21 36.43 kg/m    Assessment/Interventions: Review of patient past  medical history, allergies, medications, health status, including review of consultants reports, laboratory and other test data, was performed as part of comprehensive evaluation and provision of chronic care management services.   SDOH:  (Social Determinants of Health) assessments and interventions performed: Yes  SDOH Interventions    Flowsheet Row Most Recent Value  SDOH Interventions   Food Insecurity Interventions Intervention Not Indicated  Financial Strain Interventions Intervention Not Indicated      SDOH Screenings   Alcohol Screen: Not on file  Depression (PHQ2-9): Not on file  Financial Resource Strain: Low Risk    Difficulty of Paying Living Expenses: Not very hard  Food Insecurity: No Food Insecurity   Worried About Running Out of Food in the Last Year: Never true   Ran Out of Food in the Last Year: Never true  Housing: Not on file  Physical Activity: Not on file  Social Connections: Not on file  Stress: Not on file  Tobacco Use: Low Risk    Smoking Tobacco Use: Never   Smokeless Tobacco Use: Never   Passive Exposure: Not on file  Transportation Needs: Not on file    [place]  Allergies  Allergen Reactions   Heparin Other (See Comments)    Bruising/bleeding    Medications Reviewed Today     Reviewed by Charlton Haws, [facility] (Pharmacist) on 06/02/22 at [place] List Status: <None>   Medication Order Taking? Sig Documenting Provider Last Dose Status Informant  acetaminophen (TYLENOL) 500 MG tablet [id number] Yes Take 500 mg by mouth every 6 (six) hours as needed. [provider] Taking Active   azaTHIOprine (IMURAN) 50 MG tablet [id number] Yes Take 75 mg by mouth daily. [provider] Taking Active Self  Biotin w/ Vitamins C & E (HAIR/SKIN/NAILS PO) [id number] Yes Take by mouth.  [provider] Taking Active   Cholecalciferol (VITAMIN D3) 25 MCG (1000 UT) CAPS [id number] Yes Take 1,000 Units by mouth daily with breakfast. [provider] Taking Active Self  escitalopram (LEXAPRO) 10 MG tablet [id number] Yes TAKE 1 TABLET BY MOUTH EVERY DAY Tonia Ghent, MD Taking Active   hydroxychloroquine (PLAQUENIL) 200 MG tablet [id number] Yes Take 1 tablet (200 mg total) by mouth daily. Tonia Ghent, MD Taking Active Self  levothyroxine (SYNTHROID) 112 MCG tablet [id number] Yes TAKE 1 TABLET BY MOUTH EVERY DAY EXCEPT ON SUNDAY ONLY TAKE 1/2 TABLET Tonia Ghent, MD Taking Active   Polyethyl Glyc-Propyl Glyc PF (SYSTANE PRESERVATIVE FREE) 0.4-0.3 % SOLN [id number] Yes Place 1-2 drops into both eyes 4 (four) times daily. Place 1-2 drops into both eyes four times a day due to Sjgren's syndrome [provider] Taking Active   pramipexole (MIRAPEX) 0.125 MG tablet [id number] Yes TAKE 1 TABLET BY MOUTH AT BEDTIME. TO REPLACE REQUIP Tonia Ghent, MD Taking Active             Patient Active Problem List   Diagnosis Date Noted   RLS (restless legs syndrome) 04/03/2022   Presence of IVC filter 04/03/2022   Change in stool 04/03/2022   Acute respiratory failure with hypoxia ([place]) 04/18/2021   Acute kidney injury superimposed on chronic kidney disease ([place]) 04/18/2021   History of DVT (deep vein thrombosis) 04/18/2021   History of pulmonary embolism 04/18/2021   Thrombocytopenia ([place]) 04/18/2021   Elevated serum creatinine 01/20/2018   Pulmonary embolism ([place]) 12/12/2017   Congestive heart failure (CHF) ([place]) 12/12/2017   Obesity hypoventilation syndrome ([place]) 12/12/2017   Fall at  home 08/05/2016   COPD with acute bronchitis ([place]) 12/12/2014   Hard of hearing 12/12/2014   Low back pain 10/11/2014   Advance care planning 07/31/2014   Abnormality of gait 07/31/2014   Dyspnea on exertion 07/20/2013   Obesity 07/20/2013   Medicare annual wellness  visit, subsequent 07/27/2012   Palpitations 03/23/2012   Anxiety 06/10/2011   RASH AND OTHER NONSPECIFIC SKIN ERUPTION 09/13/2010   Dysphagia 03/12/2010   LUPUS 09/25/2009   SKIN LESION 09/21/2007   Tremor 05/28/2007   Hypothyroidism 05/27/2007   Anemia 05/27/2007   PERIPHERAL VASCULAR DISEASE 05/27/2007   GERD 05/27/2007   Rheumatoid arthritis ([place]) 05/27/2007   MENIERE'S DISEASE, HX OF 05/27/2007    Immunization History  Administered Date(s) Administered   Influenza Whole 09/28/2005, 11/29/2007   Influenza, High Dose Seasonal PF 12/13/2017   Influenza, Quadrivalent, Recombinant, Inj, Pf 10/24/2021   Influenza,inj,Quad PF,6+ Mos 10/18/2013   Influenza-Unspecified 09/09/2019   PFIZER(Purple Top)SARS-COV-2 Vaccination 06/16/2020, 07/07/2020, 01/09/2021   Pneumococcal Conjugate-13 08/02/2015   Pneumococcal Polysaccharide-23 07/29/1998, 07/26/2012   Td 11/28/1997, 03/12/2010    Conditions to be addressed/monitored:  Hyperlipidemia, GERD, Hypothyroidism, Depression, and Anxiety, Lupus, RA  Care Plan : [place]  Updates made by Charlton Haws, [place] since 06/02/2022 12:00 AM     Problem: Hyperlipidemia, GERD, Hypothyroidism, Depression, and Anxiety, Lupus, RA   Priority: High     Long-Range Goal: Disease management Completed 06/02/2022  Start Date: 09/11/2021  Expected End Date: 09/11/2022  This Visit's Progress: On track  Recent Progress: On track  Priority: High  Note:   Current Barriers:  None identified  Pharmacist Clinical Goal(s):  Patient will contact provider office for questions/concerns as evidenced notation of same in electronic health record through collaboration with PharmD and provider.   Interventions: 1:1 collaboration with Tonia Ghent, MD regarding development and update of comprehensive plan of care as evidenced by provider attestation and co-signature Inter-disciplinary care team collaboration (see longitudinal plan of  care) Comprehensive medication review performed; medication list updated in electronic medical record  Hyperlipidemia / PVD: (LDL goal < 100) -Not ideally controlled - pt reports history of PVD without procedures; she does report leg pain which may or may not be related to PVD (vs lupus vs neuropathy); she has never been on a statin -Current treatment: none -Considered statin therapy - PVD diagnosis is unclear, cannot locate ABIs or notes about dx; ASCVD risk is 17% so she may benefit from statin, given patient age it is reasonable to continue monitoring without changes  Anxiety (Goal: manage symptoms) -Controlled - pt reports "I have depression"; she thinks medication helps but does have days where she sleeps all day due to low mood, these episodes do not last longer than 1-2 days; she reports she has been on escitalopram 20 mg before and did not notice a difference -PHQ9: 0 (08/2019) - minimal depression -GAD7: not on file -Connected with PCP for mental health support -Current treatment: Escitalopram 10 mg daily - Appropriate, Effective, Safe, Accessible -Medications previously tried/failed: buspirone -Educated on Benefits of medication for symptom control -Considered changing to duloxetine for additional pain management benefits, however given patient's relative stability opted to continue same regimen for now -Recommended no change to medication - if episodes lengthen or occur more often, advised pt to contact PCP.  Hypothyroidism (Goal: maintain TSH in goal range) -Controlled - recent TSH was at goal; pt reports she takes levothyroxine with other medications most of the time -Current treatment  Levothyroxine 112  mcg daily except 1/2 tab Sunday - Appropriate, Effective, Safe, Accessible -Counseled on importance of consistency with administration -Recommended to continue current medication  Lupus / RA (Goal: manage symptoms) -Controlled - she has -pt reports symptoms are largely under  control; she does report hair loss since starting azathioprine -Follows with Dr Kathlene November, rheumatology -Current treatment  Azathioprine 50 mg - 1.5 tab daily -Appropriate, Effective, Safe, Accessible Hydroxychloroquine 200 mg daily -Appropriate, Effective, Safe, Accessible Tylenol 500 mg - 2 tab daily -Medications previously tried: prednisone x 20 years  -Recommended to continue current medication  RLS (Goal: manage symptoms) -Controlled - per pt report -Current treatment  Pramipexole 0.125 mg HS -Medications previously tried: ropinirole (headaches)  -Recommended to continue current medication  Health Maintenance -Vaccine gaps: Shingrix, TDAP -Hx PE, DVT (11/2017) - filter in place. No AC. -Dx COPD (2015) - no inhalers. Pt denies SOB/wheezing -Hx Meniere's disease - hearing loss left ear -CKD Stage 3a - GFR stable ~mid 40s -Hx hiatal hernia - recently started pepto bismol PRN -Current therapy:  Vitamin D 1000 IU Systane eye drops PRN (Sjogrens) - QID -Advised to try omeprazole OTC for hiatal hernia symptoms -Advised to try capsaicin cream for numbness in feet  Patient Goals/Self-Care Activities Patient will:  - take medications as prescribed -focus on medication adherence by routine       Medication Assistance: None required.  Patient affirms current coverage meets needs.  Compliance/Adherence/Medication fill history: Care Gaps: DEXA scan (02/13/10)  Star-Rating Drugs: None  Medication Access: Within the past 30 days, how often has patient missed a dose of medication? 0 Is a pillbox or other method used to improve adherence? No  Factors that may affect medication adherence? no barriers identified Are meds synced by current pharmacy? No  Are meds delivered by current pharmacy? No  Does patient experience delays in picking up medications due to transportation concerns? No   [facility] Services Reviewed: Is patient disadvantaged to use [facility]?: No  Current Rx  insurance plan: [facility] [place] Name and location of Current pharmacy:  [id number]: [phone]: [phone] [facility] services reviewed with patient today?: No  Reason patient declined to change pharmacies: Not mentioned at this visit   Care Plan and Follow Up Patient Decision:  Patient agrees to Care Plan and Follow-up.  Plan: The patient has been provided with contact information for the care management team and has been advised to call with any health related questions or concerns.   LCharlene Brooke PharmD, BCoral Springs Surgicenter LtdClinical Pharmacist [facility]Primary Care [phone]

## 2022-06-27 ENCOUNTER — Other Ambulatory Visit: Payer: Self-pay | Admitting: Family Medicine

## 2022-06-29 ENCOUNTER — Telehealth: Payer: Self-pay | Admitting: Family Medicine

## 2022-06-30 ENCOUNTER — Other Ambulatory Visit: Payer: Self-pay | Admitting: Family Medicine

## 2022-07-11 ENCOUNTER — Other Ambulatory Visit: Payer: Self-pay | Admitting: Family Medicine

## 2022-07-16 MED ORDER — NYSTATIN 100000 UNIT/GM EX POWD: 1.0000 | Freq: Two times a day (BID) | CUTANEOUS | 1 refills | Status: DC

## 2022-07-16 NOTE — Telephone Encounter (Signed)
Patient has been notified rx was sent and advised to call back for appt if that does not help.

## 2022-07-16 NOTE — Telephone Encounter (Signed)
This is not on her current med list. Okay to send?

## 2022-07-16 NOTE — Telephone Encounter (Signed)
Pt called in stated she has a rash under her breast  and would like to have Rx Nystain to be call in to Pharmacy . Please advise [phone]

## 2022-07-16 NOTE — Addendum Note (Signed)
Addended by: Tonia Ghent on: 07/16/2022 04:17 PM   Modules accepted: Orders

## 2022-07-16 NOTE — Telephone Encounter (Addendum)
Sent. Would get checked if not better.  Thanks.

## 2022-07-28 ENCOUNTER — Ambulatory Visit: Payer: Medicare Other | Admitting: Gastroenterology

## 2022-08-13 ENCOUNTER — Other Ambulatory Visit: Payer: Self-pay | Admitting: Family Medicine

## 2022-09-04 ENCOUNTER — Telehealth: Payer: Self-pay | Admitting: *Deleted

## 2022-09-04 NOTE — Patient Outreach (Signed)
Care Coordination   09/04/2022 Name: Hannah Pitts MRN: [id number] DOB: 1946/12/10   Care Coordination Outreach Attempts:  An unsuccessful telephone outreach was attempted today to offer the patient information about available care coordination services as a benefit of their health plan.   Follow Up Plan:  Additional outreach attempts will be made to offer the patient care coordination information and services.   Encounter Outcome:  No Answer  Care Coordination Interventions Activated:  No   Care Coordination Interventions:  No, not indicated    Blanchie Serve RN, BSN [facility] Care Management [facility] [phone] [email or web address]

## 2022-09-16 ENCOUNTER — Other Ambulatory Visit: Payer: Self-pay | Admitting: Family Medicine

## 2022-10-06 ENCOUNTER — Other Ambulatory Visit: Payer: Self-pay | Admitting: Family Medicine

## 2022-10-13 ENCOUNTER — Other Ambulatory Visit: Payer: Self-pay | Admitting: Family Medicine

## 2022-10-15 DIAGNOSIS — H16223 Keratoconjunctivitis sicca, not specified as Sjogren's, bilateral: Secondary | ICD-10-CM | POA: Diagnosis not present

## 2022-10-15 DIAGNOSIS — H35363 Drusen (degenerative) of macula, bilateral: Secondary | ICD-10-CM | POA: Diagnosis not present

## 2022-10-15 DIAGNOSIS — H2511 Age-related nuclear cataract, right eye: Secondary | ICD-10-CM | POA: Diagnosis not present

## 2022-10-15 DIAGNOSIS — H524 Presbyopia: Secondary | ICD-10-CM | POA: Diagnosis not present

## 2022-10-15 DIAGNOSIS — H35372 Puckering of macula, left eye: Secondary | ICD-10-CM | POA: Diagnosis not present

## 2022-10-15 DIAGNOSIS — H52213 Irregular astigmatism, bilateral: Secondary | ICD-10-CM | POA: Diagnosis not present

## 2022-10-15 DIAGNOSIS — Z79899 Other long term (current) drug therapy: Secondary | ICD-10-CM | POA: Diagnosis not present

## 2022-10-21 ENCOUNTER — Encounter: Payer: Self-pay | Admitting: Internal Medicine

## 2022-10-21 ENCOUNTER — Ambulatory Visit (INDEPENDENT_AMBULATORY_CARE_PROVIDER_SITE_OTHER): Payer: Medicare Other | Admitting: Internal Medicine

## 2022-10-21 DIAGNOSIS — J22 Unspecified acute lower respiratory infection: Secondary | ICD-10-CM | POA: Diagnosis not present

## 2022-10-21 MED ORDER — AMOXICILLIN-POT CLAVULANATE 875-125 MG PO TABS: 1.0000 | ORAL_TABLET | Freq: Two times a day (BID) | ORAL | 0 refills | Status: DC

## 2022-10-21 NOTE — Assessment & Plan Note (Signed)
Seems viral--but may be some into sinuses now Tends to get pneumonia if sick---and is on immunosuppressives Discussed that no evidence pneumonia now Will continue supportive care--tylenol, cough meds prn Would add augmentin 875 bid x 7 days if things worsen in the next few days

## 2022-10-21 NOTE — Progress Notes (Signed)
Subjective:    Patient ID: Hannah Pitts, female    DOB: [birth year], 76 y.o.   MRN: [id number]  HPI Here due to a respiratory illness With daughter ---ho assists with history due to poor hearing  Sore throat 5 days ago--then improved Had fever and cough 2 mornings ago Now coughing up green mucus Fever better but still coughing  Having post nasal drip Sinus pressure Sputum seems to be coming from upper airway Cough day and night--worse at night  No SOB  Has used tylenol--helps some Halls cough drops  Current Outpatient Medications on File Prior to Visit  Medication Sig Dispense Refill   acetaminophen (TYLENOL) 500 MG tablet Take 500 mg by mouth every 6 (six) hours as needed.     azaTHIOprine (IMURAN) 50 MG tablet Take 75 mg by mouth daily.     Biotin w/ Vitamins C & E (HAIR/SKIN/NAILS PO) Take by mouth.     Cholecalciferol (VITAMIN D3) 25 MCG (1000 UT) CAPS Take 1,000 Units by mouth daily with breakfast.     escitalopram (LEXAPRO) 10 MG tablet TAKE 1 TABLET BY MOUTH EVERY DAY 90 tablet 3   hydroxychloroquine (PLAQUENIL) 200 MG tablet Take 1 tablet (200 mg total) by mouth daily.     levothyroxine (SYNTHROID) 112 MCG tablet TAKE 1 TABLET BY MOUTH EVERY DAY EXCEPT ON SUNDAY ONLY TAKE 1/2 TABLET 90 tablet 1   nystatin (MYCOSTATIN/NYSTOP) powder Apply 1 Application topically 2 (two) times daily. 30 g 1   Polyethyl Glyc-Propyl Glyc PF (SYSTANE PRESERVATIVE FREE) 0.4-0.3 % SOLN Place 1-2 drops into both eyes 4 (four) times daily. Place 1-2 drops into both eyes four times a day due to Sjgren's syndrome     pramipexole (MIRAPEX) 0.125 MG tablet TAKE 1 TABLET BY MOUTH AT BEDTIME. TO REPLACE REQUIP 90 tablet 0   No current facility-administered medications on file prior to visit.    Allergies  Allergen Reactions   Heparin Other (See Comments)    Bruising/bleeding    Past Medical History:  Diagnosis Date   Abnormal involuntary movements(781.0)    Anemia of other chronic  disease    Depressive disorder, not elsewhere classified    Diaphragmatic hernia without mention of obstruction or gangrene    Dysthymic disorder    Generalized hyperhidrosis    GERD (gastroesophageal reflux disease)    H/O hiatal hernia    History of pulmonary embolus (PE)    Lymphoma ([place]) 03/2010   "they got it all w/surgery"   Meniere's disease of left ear    Migraine    "used to have classic kind; not anymore; last one was in 2012"   Other rheumatoid arthritis with visceral or systemic involvement    Palpitations    Personal history of other disorders of nervous system and sense organs    Systemic lupus erythematosus ([place])    Unspecified disease of pericardium    Pericarditis    Unspecified hypothyroidism    Urticaria, unspecified     Past Surgical History:  Procedure Laterality Date   ABDOMINAL HYSTERECTOMY  1978   APPENDECTOMY  1974   BREAST BIOPSY  03/2010   left breast;    BREAST LUMPECTOMY  03/2010   left breast; nonHodgkins lymphoma; "not breast cancer"   [place]  ?02/2011   ESOPHAGOGASTRODUODENOSCOPY  01-24-11   Slight stenosis esoph spasm   IR IVC FILTER PLMT / S&I /IMG GUID/MOD SED  12/17/2017   Left VATS  12/1997  LUMBAR DISC SURGERY  1977   shunt     put in behind left ear ; Menier's syndrome    Family History  Problem Relation Age of Onset   Heart failure Mother    Stroke Father    Coronary artery disease Sister    COPD Sister    Coronary artery disease Brother    Diabetes Brother    Heart failure Brother    Coronary artery disease Sister    Coronary artery disease Sister    Cancer Sister    Heart failure Sister    Cancer Sister        lung, esoph   Cancer Sister        lung   Breast cancer Maternal Aunt    Colon cancer Neg Hx     Social History   Socioeconomic History   Marital status: Widowed    Spouse name: Not on file   Number of children: 2   Years of education: Not on file   Highest education  level: Not on file  Occupational History   Occupation: Disabled     Employer: RETIRED  Tobacco Use   Smoking status: Never   Smokeless tobacco: Never  Substance and Sexual Activity   Alcohol use: No   Drug use: No   Sexual activity: Never  Other Topics Concern   Not on file  Social History Narrative   Hannah Pitts lives in [place], [place].    She is widowed with 2 daughters - one of her daughters lives at her house (along with extended family, grandkids and great grand child)   She worked as a housewife and later in Psychologist, educational on an Designer, television/film set.    Social Determinants of Health   Financial Resource Strain: Low Risk  (06/02/2022)   Overall Financial Resource Strain (CARDIA)    Difficulty of Paying Living Expenses: Not very hard  Food Insecurity: No Food Insecurity (06/02/2022)   Hunger Vital Sign    Worried About Running Out of Food in the Last Year: Never true    Ran Out of Food in the Last Year: Never true  Transportation Needs: Not on file  Physical Activity: Not on file  Stress: Not on file  Social Connections: Not on file  Intimate Partner Violence: Not on file   Review of Systems No N/V Eating okay     Objective:   Physical Exam Constitutional:      Appearance: Normal appearance.  HENT:     Head:     Comments: No sinus tenderness    Right Ear: Tympanic membrane and ear canal normal.     Left Ear: Tympanic membrane and ear canal normal.     Mouth/Throat:     Pharynx: No oropharyngeal exudate or posterior oropharyngeal erythema.  Pulmonary:     Effort: Pulmonary effort is normal.     Breath sounds: Normal breath sounds. No wheezing or rales.  Musculoskeletal:     Cervical back: Neck supple.  Lymphadenopathy:     Cervical: No cervical adenopathy.  Neurological:     Mental Status: She is alert.            Assessment & Plan:

## 2022-10-21 NOTE — Patient Instructions (Signed)
Please continue tylenol and you can use over the counter cough meds as needed. If you get worse in the next couple of days, start the antibiotic.

## 2022-10-27 ENCOUNTER — Telehealth: Payer: Self-pay | Admitting: Family Medicine

## 2022-10-27 MED ORDER — ESCITALOPRAM OXALATE 10 MG PO TABS: 10.0000 mg | ORAL_TABLET | Freq: Every day | ORAL | 0 refills | Status: DC

## 2022-10-27 NOTE — Telephone Encounter (Signed)
Rx sent 

## 2022-10-27 NOTE — Telephone Encounter (Signed)
  Encourage patient to contact the pharmacy for refills or they can request refills through [facility]  Did the patient contact the pharmacy: Yes  LAST APPOINTMENT DATE:  04/01/2022  NEXT APPOINTMENT DATE: N/A  MEDICATION: escitalopram (LEXAPRO) 10 MG tablet  Is the patient out of medication? Yes  PHARMACY: [id number]  - [address] Let patient know to contact pharmacy at the end of the day to make sure medication is ready.  Please notify patient to allow 48-72 hours to process

## 2022-12-04 ENCOUNTER — Other Ambulatory Visit: Payer: Self-pay | Admitting: Family Medicine

## 2023-01-13 DIAGNOSIS — H25011 Cortical age-related cataract, right eye: Secondary | ICD-10-CM | POA: Diagnosis not present

## 2023-01-13 DIAGNOSIS — H2511 Age-related nuclear cataract, right eye: Secondary | ICD-10-CM | POA: Diagnosis not present

## 2023-01-13 DIAGNOSIS — H25811 Combined forms of age-related cataract, right eye: Secondary | ICD-10-CM | POA: Diagnosis not present

## 2023-03-20 ENCOUNTER — Other Ambulatory Visit: Payer: Self-pay | Admitting: Family Medicine

## 2023-03-20 DIAGNOSIS — M329 Systemic lupus erythematosus, unspecified: Secondary | ICD-10-CM

## 2023-03-20 DIAGNOSIS — E039 Hypothyroidism, unspecified: Secondary | ICD-10-CM

## 2023-03-20 DIAGNOSIS — E785 Hyperlipidemia, unspecified: Secondary | ICD-10-CM

## 2023-03-20 DIAGNOSIS — G2581 Restless legs syndrome: Secondary | ICD-10-CM

## 2023-03-31 ENCOUNTER — Other Ambulatory Visit (INDEPENDENT_AMBULATORY_CARE_PROVIDER_SITE_OTHER): Payer: Medicare Other

## 2023-03-31 ENCOUNTER — Telehealth: Payer: Self-pay | Admitting: Family Medicine

## 2023-03-31 DIAGNOSIS — M329 Systemic lupus erythematosus, unspecified: Secondary | ICD-10-CM

## 2023-03-31 DIAGNOSIS — G2581 Restless legs syndrome: Secondary | ICD-10-CM | POA: Diagnosis not present

## 2023-03-31 DIAGNOSIS — E039 Hypothyroidism, unspecified: Secondary | ICD-10-CM | POA: Diagnosis not present

## 2023-03-31 DIAGNOSIS — E785 Hyperlipidemia, unspecified: Secondary | ICD-10-CM | POA: Diagnosis not present

## 2023-03-31 LAB — COMPREHENSIVE METABOLIC PANEL
ALT: 13 U/L (ref 0–35)
AST: 21 U/L (ref 0–37)
Albumin: 4 g/dL (ref 3.5–5.2)
Alkaline Phosphatase: 88 U/L (ref 39–117)
BUN: 31 mg/dL — ABNORMAL HIGH (ref 6–23)
CO2: 26 mEq/L (ref 19–32)
Calcium: 9.2 mg/dL (ref 8.4–10.5)
Chloride: 108 mEq/L (ref 96–112)
Creatinine, Ser: 1.08 mg/dL (ref 0.40–1.20)
GFR: 49.33 mL/min — ABNORMAL LOW (ref >60.00)
Glucose, Bld: 96 mg/dL (ref 70–99)
Potassium: 4.4 mEq/L (ref 3.5–5.1)
Sodium: 140 mEq/L (ref 135–145)
Total Bilirubin: 0.4 mg/dL (ref 0.2–1.2)
Total Protein: 6.7 g/dL (ref 6.0–8.3)

## 2023-03-31 LAB — CBC WITH DIFFERENTIAL/PLATELET
Basophils Absolute: 0 10*3/uL (ref 0.0–0.1)
Basophils Relative: 0.5 % (ref 0.0–3.0)
Eosinophils Absolute: 0.1 10*3/uL (ref 0.0–0.7)
Eosinophils Relative: 3.4 % (ref 0.0–5.0)
HCT: 40.8 % (ref 36.0–46.0)
Hemoglobin: 13.6 g/dL (ref 12.0–15.0)
Lymphocytes Relative: 34.1 % (ref 12.0–46.0)
Lymphs Abs: 1.2 10*3/uL (ref 0.7–4.0)
MCHC: 33.3 g/dL (ref 30.0–36.0)
MCV: 90.2 fl (ref 78.0–100.0)
Monocytes Absolute: 0.3 10*3/uL (ref 0.1–1.0)
Monocytes Relative: 8.3 % (ref 3.0–12.0)
Neutro Abs: 1.9 10*3/uL (ref 1.4–7.7)
Neutrophils Relative %: 53.7 % (ref 43.0–77.0)
Platelets: 148 10*3/uL — ABNORMAL LOW (ref 150.0–400.0)
RBC: 4.52 Mil/uL (ref 3.87–5.11)
RDW: 13.6 % (ref 11.5–15.5)
WBC: 3.5 10*3/uL — ABNORMAL LOW (ref 4.0–10.5)

## 2023-03-31 LAB — TSH: TSH: 0.27 u[IU]/mL — ABNORMAL LOW (ref 0.35–5.50)

## 2023-03-31 LAB — LIPID PANEL
Cholesterol: 173 mg/dL (ref 0–200)
HDL: 44.6 mg/dL (ref >39.00)
LDL Cholesterol: 104 mg/dL — ABNORMAL HIGH (ref 0–99)
NonHDL: 128.88
Total CHOL/HDL Ratio: 4
Triglycerides: 123 mg/dL (ref 0.0–149.0)
VLDL: 24.6 mg/dL (ref 0.0–40.0)

## 2023-03-31 LAB — VITAMIN D 25 HYDROXY (VIT D DEFICIENCY, FRACTURES): VITD: 38.98 ng/mL (ref 30.00–100.00)

## 2023-03-31 NOTE — Telephone Encounter (Signed)
[facility] to schedule their annual wellness visit. Appointment made for 04/07/2023.  [place] Direct Dial: [phone]

## 2023-04-07 ENCOUNTER — Ambulatory Visit (INDEPENDENT_AMBULATORY_CARE_PROVIDER_SITE_OTHER): Payer: Medicare Other | Admitting: Family Medicine

## 2023-04-07 ENCOUNTER — Ambulatory Visit (INDEPENDENT_AMBULATORY_CARE_PROVIDER_SITE_OTHER): Payer: Medicare Other

## 2023-04-07 ENCOUNTER — Encounter: Payer: Self-pay | Admitting: Family Medicine

## 2023-04-07 VITALS — Ht 65.0 in | Wt 200.0 lb

## 2023-04-07 VITALS — BP 118/76 | HR 69 | Temp 97.8°F | Ht 65.0 in | Wt 204.0 lb

## 2023-04-07 DIAGNOSIS — R269 Unspecified abnormalities of gait and mobility: Secondary | ICD-10-CM

## 2023-04-07 DIAGNOSIS — E039 Hypothyroidism, unspecified: Secondary | ICD-10-CM | POA: Diagnosis not present

## 2023-04-07 DIAGNOSIS — Z0000 Encounter for general adult medical examination without abnormal findings: Secondary | ICD-10-CM

## 2023-04-07 DIAGNOSIS — F339 Major depressive disorder, recurrent, unspecified: Secondary | ICD-10-CM | POA: Diagnosis not present

## 2023-04-07 DIAGNOSIS — Z7189 Other specified counseling: Secondary | ICD-10-CM

## 2023-04-07 DIAGNOSIS — M069 Rheumatoid arthritis, unspecified: Secondary | ICD-10-CM

## 2023-04-07 DIAGNOSIS — G2581 Restless legs syndrome: Secondary | ICD-10-CM

## 2023-04-07 MED ORDER — ESCITALOPRAM OXALATE 20 MG PO TABS: 20.0000 mg | ORAL_TABLET | Freq: Every day | ORAL | 3 refills | Status: DC

## 2023-04-07 MED ORDER — LORATADINE 10 MG PO TABS
10.0000 mg | ORAL_TABLET | Freq: Every day | ORAL | Status: DC
Start: 2023-04-07 — End: 2023-12-14

## 2023-04-07 MED ORDER — LEVOTHYROXINE SODIUM 112 MCG PO TABS: 112.0000 ug | ORAL_TABLET | Freq: Every day | ORAL | 3 refills | Status: DC

## 2023-04-07 NOTE — Progress Notes (Unsigned)
Hypothyroidism.  Compliant.  TSH minimally low.  Discussed change in dosing.  No neck mass.   RLS.  On mirapex at baseline.  It helped some, occ with 2nd dose.  Discussed not using benadryl.    Discussed using claritin for itching, prn.    Off plaquenil per outside clinic.  Diarrhea is better now.  Still on azathioprine.    Flu d/w pt.   Shingles discussed with patient PNA previously done Tetanus 2011 COVID-vaccine previously done Colon cancer screening discussed with patient.  Declined by patient.  Diarrhea resolved.   Breast cancer screening declined by patient Bone density test deferred by patient request. Advance directive-both daughters equally designated if patient were incapacitated.  Hearing loss.  Using headphones for TV.  She is going let me know if she wants to see audiology.  Mood d/w pt, esp with hearing loss.  PHQ scoring d/w pt.    She was up late at night, fell about 2 months ago.  Walking with rollator at home.  Cautions d/w pt.  PT encouraged, she'll consider.    Meds, vitals, and allergies reviewed.   ROS: Per HPI unless specifically indicated in ROS section   GEN: nad, alert and oriented HEENT: mucous membranes moist NECK: supple w/o LA CV: rrr. PULM: ctab, no inc wob ABD: soft, +bs EXT: no edema SKIN: no acute rash

## 2023-04-07 NOTE — Patient Instructions (Addendum)
Let me know if you need help getting hearings aids.   Take care.  Glad to see you.  Try lexapro 20mg  a day.  Skip your thyroid medicine on Sundays.  1 tab on the other days of the week. Recheck TSH in about 2 months at a lab visit.    Let me know if you keep having trouble with your legs at night.   Let me know if you want to get set up with home PT.

## 2023-04-07 NOTE — Progress Notes (Signed)
I connected with  CHALENA POPPY on 04/07/23 by a audio enabled telemedicine application and verified that I am speaking with the correct person using two identifiers.  Patient Location: Home  Provider Location: Home Office  I discussed the limitations of evaluation and management by telemedicine. The patient expressed understanding and agreed to proceed.  Subjective:   ANATHEA PINERA is a 77 y.o. female who presents for Medicare Annual (Subsequent) preventive examination.  Review of Systems      Cardiac Risk Factors include: advanced age (>68men, >41 women);sedentary lifestyle     Objective:    Today's Vitals   04/07/23 0816 04/07/23 0817  Weight: 200 lb (90.7 kg)   Height: 5\' 5"  (1.651 m)   PainSc:  5    Body mass index is 33.28 kg/m.     04/07/2023    8:33 AM 04/18/2021    4:20 PM 12/12/2017    4:44 AM 03/23/2012    1:49 PM  Advanced Directives  Does Patient Have a Medical Advance Directive? No No No Patient does not have advance directive;Patient would like information  Would patient like information on creating a medical advance directive? No - Patient declined No - Patient declined No - Patient declined Referral made to social work    Current Medications (verified) Outpatient Encounter Medications as of 04/07/2023  Medication Sig   acetaminophen (TYLENOL) 500 MG tablet Take 500 mg by mouth every 6 (six) hours as needed.   azaTHIOprine (IMURAN) 50 MG tablet Take 75 mg by mouth daily.   Cholecalciferol (VITAMIN D3) 25 MCG (1000 UT) CAPS Take 1,000 Units by mouth daily with breakfast.   escitalopram (LEXAPRO) 10 MG tablet Take 1 tablet (10 mg total) by mouth daily.   levothyroxine (SYNTHROID) 112 MCG tablet TAKE 1 TABLET BY MOUTH EVERY DAY EXCEPT ON SUNDAY ONLY TAKE 1/2 TABLET   nystatin (MYCOSTATIN/NYSTOP) powder Apply 1 Application topically 2 (two) times daily.   Polyethyl Glyc-Propyl Glyc PF (SYSTANE PRESERVATIVE FREE) 0.4-0.3 % SOLN Place 1-2 drops into both eyes  4 (four) times daily. Place 1-2 drops into both eyes four times a day due to Sjgren's syndrome   pramipexole (MIRAPEX) 0.125 MG tablet TAKE 1 TABLET BY MOUTH AT BEDTIME. TO REPLACE REQUIP   amoxicillin-clavulanate (AUGMENTIN) 875-125 MG tablet Take 1 tablet by mouth 2 (two) times daily. (Patient not taking: Reported on 04/07/2023)   Biotin w/ Vitamins C & E (HAIR/SKIN/NAILS PO) Take by mouth. (Patient not taking: Reported on 04/07/2023)   hydroxychloroquine (PLAQUENIL) 200 MG tablet Take 1 tablet (200 mg total) by mouth daily. (Patient not taking: Reported on 04/07/2023)   No facility-administered encounter medications on file as of 04/07/2023.    Allergies (verified) Heparin   History: Past Medical History:  Diagnosis Date   Abnormal involuntary movements(781.0)    Anemia of other chronic disease    Depressive disorder, not elsewhere classified    Diaphragmatic hernia without mention of obstruction or gangrene    Dysthymic disorder    Generalized hyperhidrosis    GERD (gastroesophageal reflux disease)    H/O hiatal hernia    History of pulmonary embolus (PE)    Lymphoma 03/2010   "they got it all w/surgery"   Meniere's disease of left ear    Migraine    "used to have classic kind; not anymore; last one was in 2012"   Other rheumatoid arthritis with visceral or systemic involvement    Palpitations    Personal history of other disorders of nervous system  and sense organs    Systemic lupus erythematosus    Unspecified disease of pericardium    Pericarditis    Unspecified hypothyroidism    Urticaria, unspecified    Past Surgical History:  Procedure Laterality Date   ABDOMINAL HYSTERECTOMY  1978   APPENDECTOMY  1974   BREAST BIOPSY  03/2010   left breast;    BREAST LUMPECTOMY  03/2010   left breast; nonHodgkins lymphoma; "not breast cancer"   CHOLECYSTECTOMY  1974   ESOPHAGEAL DILATION  ?02/2011   ESOPHAGOGASTRODUODENOSCOPY  01-24-11   Slight stenosis esoph spasm   IR IVC FILTER  PLMT / S&I /IMG GUID/MOD SED  12/17/2017   Left VATS  12/1997   LUMBAR DISC SURGERY  1977   shunt     put in behind left ear ; Menier's syndrome   Family History  Problem Relation Age of Onset   Heart failure Mother    Stroke Father    Coronary artery disease Sister    COPD Sister    Coronary artery disease Brother    Diabetes Brother    Heart failure Brother    Coronary artery disease Sister    Coronary artery disease Sister    Cancer Sister    Heart failure Sister    Cancer Sister        lung, esoph   Cancer Sister        lung   Breast cancer Maternal Aunt    Colon cancer Neg Hx    Social History   Socioeconomic History   Marital status: Widowed    Spouse name: Not on file   Number of children: 2   Years of education: Not on file   Highest education level: Not on file  Occupational History   Occupation: Disabled     Employer: RETIRED  Tobacco Use   Smoking status: Never   Smokeless tobacco: Never  Substance and Sexual Activity   Alcohol use: No   Drug use: No   Sexual activity: Never  Other Topics Concern   Not on file  Social History Narrative   Ms. Bonus lives in [place], [place].    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    Social Determinants of Health   Financial Resource Strain: Low Risk  (04/07/2023)   Overall Financial Resource Strain (CARDIA)    Difficulty of Paying Living Expenses: Not hard at all  Food Insecurity: No Food Insecurity (04/07/2023)   Hunger Vital Sign    Worried About Running Out of Food in the Last Year: Never true    Ran Out of Food in the Last Year: Never true  Transportation Needs: No Transportation Needs (04/07/2023)   PRAPARE - Administrator, Civil ServiceTransportation    Lack of Transportation (Medical): No    Lack of Transportation (Non-Medical): No  Physical Activity: Not on file   Stress: No Stress Concern Present (04/07/2023)   [employer] - Occupational Stress Questionnaire    Feeling of Stress : Not at all  Social Connections: Socially Isolated (04/07/2023)   Social Connection and Isolation Panel [NHANES]    Frequency of Communication with Friends and Family: Twice a week    Frequency of Social Gatherings with Friends and Family: More than three times a week    Attends Religious Services: Never  Active Member of Clubs or Organizations: No    Attends Banker Meetings: Never    Marital Status: Widowed    Tobacco Counseling Counseling given: Not Answered   Clinical Intake:  Pre-visit preparation completed: Yes  Pain : 0-10 Pain Score: 5  Pain Type: Chronic pain Pain Location: Back Pain Descriptors / Indicators: Sharp Pain Onset: More than a month ago     Nutritional Risks: None Diabetes: No  How often do you need to have someone help you when you read instructions, pamphlets, or other written materials from your doctor or pharmacy?: 1 - Never  Diabetic? no  Interpreter Needed?: No  Information entered by :: C.Loretta Doutt LPN   Activities of Daily Living    04/07/2023    8:35 AM  In your present state of health, do you have any difficulty performing the following activities:  Hearing? 1  Comment Hearing loss  Vision? 0  Difficulty concentrating or making decisions? 1  Comment Forgets  Walking or climbing stairs? 0  Dressing or bathing? 0  Doing errands, shopping? 1  Comment Daughter Ship broker and eating ? Y  Comment daughter assist  Using the Toilet? N  In the past six months, have you accidently leaked urine? Y  Comment Wears pads  Do you have problems with loss of bowel control? N  Managing your Medications? N  Managing your Finances? N  Housekeeping or managing your Housekeeping? N    Patient Care Team: Joaquim Nam, MD as PCP - General (Family Medicine) Kathyrn Sheriff, [facility] as Pharmacist (Pharmacist)  Indicate any recent Medical Services you may have received from other than [facility] providers in the past year (date may be approximate).     Assessment:   This is a routine wellness examination for [place].  Hearing/Vision screen Hearing Screening - Comments:: No aids Vision Screening - Comments:: Readers - [facility]  Dietary issues and exercise activities discussed: Current Exercise Habits: The patient does not participate in regular exercise at present, Exercise limited by: None identified   Goals Addressed             This Visit's Progress    Patient Stated       Walk more.       Depression Screen    04/07/2023    8:32 AM 09/15/2019    2:43 PM 04/28/2017    9:57 AM 08/02/2015    2:22 PM 07/31/2014    8:37 AM 07/28/2013    8:35 AM  PHQ 2/9 Scores  PHQ - 2 Score 0 0 2 2 0 2    Fall Risk    04/07/2023    8:34 AM 04/01/2022   10:33 AM 03/29/2021   11:14 AM 09/15/2019    2:42 PM 04/28/2017    9:57 AM  Fall Risk   Falls in the past year? 1 1 0 0 Yes  Number falls in past yr: 1 1 0  2 or more  Injury with Fall? 0 0 0    Risk for fall due to : Impaired balance/gait;Impaired mobility History of fall(s);Impaired balance/gait     Follow up Falls evaluation completed;Education provided;Falls prevention discussed Falls evaluation completed Falls evaluation completed      FALL RISK PREVENTION PERTAINING TO THE HOME:  Any stairs in or around the home? No  If so, are there any without handrails? No  Home free of loose throw rugs in walkways, pet beds, electrical cords, etc? Yes  Adequate lighting in  your home to reduce risk of falls? Yes   ASSISTIVE DEVICES UTILIZED TO PREVENT FALLS:  Life alert? No  Use of a cane, walker or w/c? Yes  Grab bars in the bathroom? Yes  Shower chair or bench in shower? Yes  Elevated toilet seat or a handicapped toilet? Yes    Cognitive Function:        04/07/2023    8:37 AM  6CIT Screen  What Year? 0  points  What month? 0 points  What time? 0 points  Count back from 20 0 points  Months in reverse 0 points  Repeat phrase 0 points  Total Score 0 points    Immunizations Immunization History  Administered Date(s) Administered   Influenza Whole 09/28/2005, 11/29/2007   Influenza, High Dose Seasonal PF 12/13/2017   Influenza, Quadrivalent, Recombinant, Inj, Pf 10/24/2021   Influenza,inj,Quad PF,6+ Mos 10/18/2013   Influenza-Unspecified 09/09/2019   PFIZER(Purple Top)SARS-COV-2 Vaccination 06/16/2020, 07/07/2020, 01/09/2021   Pneumococcal Conjugate-13 08/02/2015   Pneumococcal Polysaccharide-23 07/29/1998, 07/26/2012   Td 11/28/1997, 03/12/2010    TDAP status: Due, Education has been provided regarding the importance of this vaccine. Advised may receive this vaccine at local pharmacy or Health Dept. Aware to provide a copy of the vaccination record if obtained from local pharmacy or Health Dept. Verbalized acceptance and understanding.  Flu Vaccine status: Declined, Education has been provided regarding the importance of this vaccine but patient still declined. Advised may receive this vaccine at local pharmacy or Health Dept. Aware to provide a copy of the vaccination record if obtained from local pharmacy or Health Dept. Verbalized acceptance and understanding.  Pneumococcal vaccine status: Up to date  Covid-19 vaccine status: Information provided on how to obtain vaccines.   Qualifies for Shingles Vaccine? Yes   Zostavax completed No   Shingrix Completed?: No.    Education has been provided regarding the importance of this vaccine. Patient has been advised to call insurance company to determine out of pocket expense if they have not yet received this vaccine. Advised may also receive vaccine at local pharmacy or Health Dept. Verbalized acceptance and understanding.  Screening Tests Health Maintenance  Topic Date Due   Zoster Vaccines- Shingrix (1 of 2) Never done   DEXA SCAN   02/13/2010   DTaP/Tdap/Td (3 - Tdap) 03/12/2020   COVID-19 Vaccine (4 - 2023-24 season) 08/29/2022   INFLUENZA VACCINE  07/30/2023   Medicare Annual Wellness (AWV)  04/06/2024   Pneumonia Vaccine 39+ Years old  Completed   Hepatitis C Screening  Completed   HPV VACCINES  Aged Out    Health Maintenance  Health Maintenance Due  Topic Date Due   Zoster Vaccines- Shingrix (1 of 2) Never done   DEXA SCAN  02/13/2010   DTaP/Tdap/Td (3 - Tdap) 03/12/2020   COVID-19 Vaccine (4 - 2023-24 season) 08/29/2022    Colorectal cancer screening: No longer required.   Mammogram status: No longer required due to age.  Bone Scan - Declined  Lung Cancer Screening: (Low Dose CT Chest recommended if Age 64-80 years, 30 pack-year currently smoking OR have quit w/in 15years.) does not qualify.   Lung Cancer Screening Referral: no  Additional Screening:  Hepatitis C Screening: does qualify; Completed 04/28/17  Vision Screening: Recommended annual ophthalmology exams for early detection of glaucoma and other disorders of the eye. Is the patient up to date with their annual eye exam?  Yes  Who is the provider or what is the name of the office in which  the patient attends annual eye exams? [facility]  If pt is not established with a provider, would they like to be referred to a provider to establish care? No .   Dental Screening: Recommended annual dental exams for proper oral hygiene  Community Resource Referral / Chronic Care Management: CRR required this visit?  No   CCM required this visit?  No      Plan:     I have personally reviewed and noted the following in the patient's chart:   Medical and social history Use of alcohol, tobacco or illicit drugs  Current medications and supplements including opioid prescriptions. Patient is not currently taking opioid prescriptions. Functional ability and status Nutritional status Physical activity Advanced directives List of other  physicians Hospitalizations, surgeries, and ER visits in previous 12 months Vitals Screenings to include cognitive, depression, and falls Referrals and appointments  In addition, I have reviewed and discussed with patient certain preventive protocols, quality metrics, and best practice recommendations. A written personalized care plan for preventive services as well as general preventive health recommendations were provided to patient.     Maryan Puls, LPN   02/27/5944   Nurse Notes: none

## 2023-04-07 NOTE — Patient Instructions (Signed)
Hannah Pitts , Thank you for taking time to come for your Medicare Wellness Visit. I appreciate your ongoing commitment to your health goals. Please review the following plan we discussed and let me know if I can assist you in the future.   These are the goals we discussed:  Goals      Manage My Medicine     Timeframe:  Long-Range Goal Priority:  Medium Start Date:     09/11/21                        Expected End Date: 09/11/22                      Follow Up Date Dec 2022   - call for medicine refill 2 or 3 days before it runs out - call if I am sick and can't take my medicine - keep a list of all the medicines I take; vitamins and herbals too - use a pillbox to sort medicine  -Take omeprazole 20 mg (OTC) for hiatal hernia -Take Hair, Skin and Nails vitamin for hair loss -Try capsaicin cream 1-2 times daily on feet for nerve pain -Get Shingrix vaccine at local pharmacy -Call pharmacist to switch to [facility] if desired -Call PCP if depression episodes last longer or occur more frequently   Why is this important?   These steps will help you keep on track with your medicines.   Notes:      Patient Stated     Walk more.        This is a list of the screening recommended for you and due dates:  Health Maintenance  Topic Date Due   Zoster (Shingles) Vaccine (1 of 2) Never done   DEXA scan (bone density measurement)  02/13/2010   DTaP/Tdap/Td vaccine (3 - Tdap) 03/12/2020   COVID-19 Vaccine (4 - 2023-24 season) 08/29/2022   Flu Shot  07/30/2023   Medicare Annual Wellness Visit  04/06/2024   Pneumonia Vaccine  Completed   Hepatitis C Screening: USPSTF Recommendation to screen - Ages 18-79 yo.  Completed   HPV Vaccine  Aged Out    Advanced directives: Please bring a copy of your health care power of attorney and living will to the office to be added to your chart at your convenience.   Conditions/risks identified: Aim for 30 minutes of exercise or brisk walking, 6-8  glasses of water, and 5 servings of fruits and vegetables each day.   Next appointment: Follow up in one year for your annual wellness visit 04/07/24 @ 8:45 telephone visit.   Preventive Care 7965 Years and Older, Female Preventive care refers to lifestyle choices and visits with your health care provider that can promote health and wellness. What does preventive care include? A yearly physical exam. This is also called an annual well check. Dental exams once or twice a year. Routine eye exams. Ask your health care provider how often you should have your eyes checked. Personal lifestyle choices, including: Daily care of your teeth and gums. Regular physical activity. Eating a healthy diet. Avoiding tobacco and drug use. Limiting alcohol use. Practicing safe sex. Taking low-dose aspirin every day. Taking vitamin and mineral supplements as recommended by your health care provider. What happens during an annual well check? The services and screenings done by your health care provider during your annual well check will depend on your age, overall health, lifestyle risk factors, and family history  of disease. Counseling  Your health care provider may ask you questions about your: Alcohol use. Tobacco use. Drug use. Emotional well-being. Home and relationship well-being. Sexual activity. Eating habits. History of falls. Memory and ability to understand (cognition). Work and work Astronomer. Reproductive health. Screening  You may have the following tests or measurements: Height, weight, and BMI. Blood pressure. Lipid and cholesterol levels. These may be checked every 5 years, or more frequently if you are over 69 years old. Skin check. Lung cancer screening. You may have this screening every year starting at age 79 if you have a 30-pack-year history of smoking and currently smoke or have quit within the past 15 years. Fecal occult blood test (FOBT) of the stool. You may have this  test every year starting at age 32. Flexible sigmoidoscopy or colonoscopy. You may have a sigmoidoscopy every 5 years or a colonoscopy every 10 years starting at age 18. Hepatitis C blood test. Hepatitis B blood test. Sexually transmitted disease (STD) testing. Diabetes screening. This is done by checking your blood sugar (glucose) after you have not eaten for a while (fasting). You may have this done every 1-3 years. Bone density scan. This is done to screen for osteoporosis. You may have this done starting at age 64. Mammogram. This may be done every 1-2 years. Talk to your health care provider about how often you should have regular mammograms. Talk with your health care provider about your test results, treatment options, and if necessary, the need for more tests. Vaccines  Your health care provider may recommend certain vaccines, such as: Influenza vaccine. This is recommended every year. Tetanus, diphtheria, and acellular pertussis (Tdap, Td) vaccine. You may need a Td booster every 10 years. Zoster vaccine. You may need this after age 30. Pneumococcal 13-valent conjugate (PCV13) vaccine. One dose is recommended after age 68. Pneumococcal polysaccharide (PPSV23) vaccine. One dose is recommended after age 103. Talk to your health care provider about which screenings and vaccines you need and how often you need them. This information is not intended to replace advice given to you by your health care provider. Make sure you discuss any questions you have with your health care provider. Document Released: 01/11/2016 Document Revised: 09/03/2016 Document Reviewed: 10/16/2015 Elsevier Interactive Patient Education  2017 [employer].  Fall Prevention in the Home Falls can cause injuries. They can happen to people of all ages. There are many things you can do to make your home safe and to help prevent falls. What can I do on the outside of my home? Regularly fix the edges of walkways and  driveways and fix any cracks. Remove anything that might make you trip as you walk through a door, such as a raised step or threshold. Trim any bushes or trees on the path to your home. Use bright outdoor lighting. Clear any walking paths of anything that might make someone trip, such as rocks or tools. Regularly check to see if handrails are loose or broken. Make sure that both sides of any steps have handrails. Any raised decks and porches should have guardrails on the edges. Have any leaves, snow, or ice cleared regularly. Use sand or salt on walking paths during winter. Clean up any spills in your garage right away. This includes oil or grease spills. What can I do in the bathroom? Use night lights. Install grab bars by the toilet and in the tub and shower. Do not use towel bars as grab bars. Use non-skid mats or  decals in the tub or shower. If you need to sit down in the shower, use a plastic, non-slip stool. Keep the floor dry. Clean up any water that spills on the floor as soon as it happens. Remove soap buildup in the tub or shower regularly. Attach bath mats securely with double-sided non-slip rug tape. Do not have throw rugs and other things on the floor that can make you trip. What can I do in the bedroom? Use night lights. Make sure that you have a light by your bed that is easy to reach. Do not use any sheets or blankets that are too big for your bed. They should not hang down onto the floor. Have a firm chair that has side arms. You can use this for support while you get dressed. Do not have throw rugs and other things on the floor that can make you trip. What can I do in the kitchen? Clean up any spills right away. Avoid walking on wet floors. Keep items that you use a lot in easy-to-reach places. If you need to reach something above you, use a strong step stool that has a grab bar. Keep electrical cords out of the way. Do not use floor polish or wax that makes floors  slippery. If you must use wax, use non-skid floor wax. Do not have throw rugs and other things on the floor that can make you trip. What can I do with my stairs? Do not leave any items on the stairs. Make sure that there are handrails on both sides of the stairs and use them. Fix handrails that are broken or loose. Make sure that handrails are as long as the stairways. Check any carpeting to make sure that it is firmly attached to the stairs. Fix any carpet that is loose or worn. Avoid having throw rugs at the top or bottom of the stairs. If you do have throw rugs, attach them to the floor with carpet tape. Make sure that you have a light switch at the top of the stairs and the bottom of the stairs. If you do not have them, ask someone to add them for you. What else can I do to help prevent falls? Wear shoes that: Do not have high heels. Have rubber bottoms. Are comfortable and fit you well. Are closed at the toe. Do not wear sandals. If you use a stepladder: Make sure that it is fully opened. Do not climb a closed stepladder. Make sure that both sides of the stepladder are locked into place. Ask someone to hold it for you, if possible. Clearly mark and make sure that you can see: Any grab bars or handrails. First and last steps. Where the edge of each step is. Use tools that help you move around (mobility aids) if they are needed. These include: Canes. Walkers. Scooters. Crutches. Turn on the lights when you go into a dark area. Replace any light bulbs as soon as they burn out. Set up your furniture so you have a clear path. Avoid moving your furniture around. If any of your floors are uneven, fix them. If there are any pets around you, be aware of where they are. Review your medicines with your doctor. Some medicines can make you feel dizzy. This can increase your chance of falling. Ask your doctor what other things that you can do to help prevent falls. This information is not  intended to replace advice given to you by your health care provider. Make sure  you discuss any questions you have with your health care provider. Document Released: 10/11/2009 Document Revised: 05/22/2016 Document Reviewed: 01/19/2015 Elsevier Interactive Patient Education  2017 [employer].

## 2023-04-08 DIAGNOSIS — Z0000 Encounter for general adult medical examination without abnormal findings: Secondary | ICD-10-CM | POA: Insufficient documentation

## 2023-04-08 DIAGNOSIS — F339 Major depressive disorder, recurrent, unspecified: Secondary | ICD-10-CM | POA: Insufficient documentation

## 2023-04-08 NOTE — Assessment & Plan Note (Signed)
discussed increasing Lexapro in the meantime and trying to limit social isolation due to hearing loss.  Routine SSRI cautions discussed with patient.  She can update me as needed.

## 2023-04-08 NOTE — Assessment & Plan Note (Signed)
Advance directive-both daughters equally designated if patient were incapacitated. ?

## 2023-04-08 NOTE — Assessment & Plan Note (Signed)
She was up late at night, fell about 2 months ago.  Walking with rollator at home.  Cautions d/w pt.  PT encouraged, she'll consider.

## 2023-04-08 NOTE — Assessment & Plan Note (Signed)
Skip levothyroxine on Sundays.  1 tab on the other days of the week.  6 tablets/week.  Recheck TSH in about 2 months at a lab visit.

## 2023-04-08 NOTE — Assessment & Plan Note (Signed)
On mirapex at baseline.  It helped some, occ with 2nd dose used as needed.  Discussed not using benadryl.  We may need to adjust her Mirapex dose after addressing her levothyroxine dose.  See above.

## 2023-04-08 NOTE — Assessment & Plan Note (Signed)
Flu d/w pt.   Shingles discussed with patient PNA previously done Tetanus 2011 COVID-vaccine previously done Colon cancer screening discussed with patient.  Declined by patient.  Diarrhea resolved.   Breast cancer screening declined by patient Bone density test deferred by patient request. Advance directive-both daughters equally designated if patient were incapacitated.

## 2023-04-08 NOTE — Assessment & Plan Note (Signed)
Off plaquenil per outside clinic.  Diarrhea is better now.  Still on azathioprine.

## 2023-04-13 ENCOUNTER — Other Ambulatory Visit: Payer: Self-pay | Admitting: Family Medicine

## 2023-04-25 ENCOUNTER — Other Ambulatory Visit: Payer: Self-pay | Admitting: Family Medicine

## 2023-06-05 DIAGNOSIS — H35372 Puckering of macula, left eye: Secondary | ICD-10-CM | POA: Diagnosis not present

## 2023-06-05 DIAGNOSIS — Z79899 Other long term (current) drug therapy: Secondary | ICD-10-CM | POA: Diagnosis not present

## 2023-06-05 DIAGNOSIS — H04123 Dry eye syndrome of bilateral lacrimal glands: Secondary | ICD-10-CM | POA: Diagnosis not present

## 2023-06-05 DIAGNOSIS — H16223 Keratoconjunctivitis sicca, not specified as Sjogren's, bilateral: Secondary | ICD-10-CM | POA: Diagnosis not present

## 2023-06-08 ENCOUNTER — Encounter: Payer: Self-pay | Admitting: Family Medicine

## 2023-06-08 ENCOUNTER — Other Ambulatory Visit (INDEPENDENT_AMBULATORY_CARE_PROVIDER_SITE_OTHER): Payer: Medicare Other

## 2023-06-08 DIAGNOSIS — E039 Hypothyroidism, unspecified: Secondary | ICD-10-CM | POA: Diagnosis not present

## 2023-06-08 LAB — TSH: TSH: 2.52 u[IU]/mL (ref 0.35–5.50)

## 2023-06-11 ENCOUNTER — Telehealth: Payer: Self-pay | Admitting: Family Medicine

## 2023-06-11 NOTE — Telephone Encounter (Signed)
Patient returned call regarding labs,I relayed the good news to her,she understood and okayed everything.

## 2023-12-14 ENCOUNTER — Encounter: Payer: Self-pay | Admitting: Family Medicine

## 2023-12-14 ENCOUNTER — Ambulatory Visit: Payer: Medicare Other | Attending: Family Medicine

## 2023-12-14 ENCOUNTER — Ambulatory Visit: Payer: Medicare Other | Admitting: Family Medicine

## 2023-12-14 ENCOUNTER — Ambulatory Visit
Admission: RE | Admit: 2023-12-14 | Discharge: 2023-12-14 | Disposition: A | Discharge status: Home / Self Care | Payer: Medicare Other | Source: Ambulatory Visit | Attending: Family Medicine | Admitting: Family Medicine

## 2023-12-14 VITALS — BP 124/74 | HR 67 | Temp 98.9°F | Ht 65.0 in | Wt 208.4 lb

## 2023-12-14 DIAGNOSIS — M47816 Spondylosis without myelopathy or radiculopathy, lumbar region: Secondary | ICD-10-CM | POA: Diagnosis not present

## 2023-12-14 DIAGNOSIS — R55 Syncope and collapse: Secondary | ICD-10-CM

## 2023-12-14 DIAGNOSIS — M549 Dorsalgia, unspecified: Secondary | ICD-10-CM

## 2023-12-14 DIAGNOSIS — M545 Low back pain, unspecified: Secondary | ICD-10-CM | POA: Diagnosis not present

## 2023-12-14 DIAGNOSIS — M47814 Spondylosis without myelopathy or radiculopathy, thoracic region: Secondary | ICD-10-CM | POA: Diagnosis not present

## 2023-12-14 DIAGNOSIS — M4807 Spinal stenosis, lumbosacral region: Secondary | ICD-10-CM | POA: Diagnosis not present

## 2023-12-14 LAB — COMPREHENSIVE METABOLIC PANEL
ALT: 47 U/L — ABNORMAL HIGH (ref 0–35)
AST: 124 U/L — ABNORMAL HIGH (ref 0–37)
Albumin: 4.3 g/dL (ref 3.5–5.2)
Alkaline Phosphatase: 99 U/L (ref 39–117)
BUN: 43 mg/dL — ABNORMAL HIGH (ref 6–23)
CO2: 26 meq/L (ref 19–32)
Calcium: 9.5 mg/dL (ref 8.4–10.5)
Chloride: 107 meq/L (ref 96–112)
Creatinine, Ser: 1.48 mg/dL — ABNORMAL HIGH (ref 0.40–1.20)
GFR: 33.63 mL/min — ABNORMAL LOW (ref >60.00)
Glucose, Bld: 104 mg/dL — ABNORMAL HIGH (ref 70–99)
Potassium: 4.7 meq/L (ref 3.5–5.1)
Sodium: 144 meq/L (ref 135–145)
Total Bilirubin: 0.5 mg/dL (ref 0.2–1.2)
Total Protein: 7 g/dL (ref 6.0–8.3)

## 2023-12-14 LAB — CBC WITH DIFFERENTIAL/PLATELET
Basophils Absolute: 0 10*3/uL (ref 0.0–0.1)
Basophils Relative: 0.3 % (ref 0.0–3.0)
Eosinophils Absolute: 0.1 10*3/uL (ref 0.0–0.7)
Eosinophils Relative: 0.7 % (ref 0.0–5.0)
HCT: 43.2 % (ref 36.0–46.0)
Hemoglobin: 14.3 g/dL (ref 12.0–15.0)
Lymphocytes Relative: 20.6 % (ref 12.0–46.0)
Lymphs Abs: 1.7 10*3/uL (ref 0.7–4.0)
MCHC: 33.1 g/dL (ref 30.0–36.0)
MCV: 91.8 fL (ref 78.0–100.0)
Monocytes Absolute: 0.7 10*3/uL (ref 0.1–1.0)
Monocytes Relative: 8.7 % (ref 3.0–12.0)
Neutro Abs: 5.6 10*3/uL (ref 1.4–7.7)
Neutrophils Relative %: 69.7 % (ref 43.0–77.0)
Platelets: 167 10*3/uL (ref 150.0–400.0)
RBC: 4.7 Mil/uL (ref 3.87–5.11)
RDW: 13.8 % (ref 11.5–15.5)
WBC: 8 10*3/uL (ref 4.0–10.5)

## 2023-12-14 LAB — TSH: TSH: 0.52 u[IU]/mL (ref 0.35–5.50)

## 2023-12-14 MED ORDER — IBUPROFEN 200 MG PO TABS: 200.0000 mg | ORAL_TABLET | Freq: Three times a day (TID) | ORAL | Status: DC | PRN

## 2023-12-14 NOTE — Progress Notes (Unsigned)
Using cane vs rollator.    Stopped azathioprine ~6 months ago.    T spine vs L spine back pain, worse with standing.  Can happen in a band across the back.  After a few minutes she has pain.  As soon as she lays down the pain is better.  Sitting down help but not as much as laying down.  No leg pain.  Going on for about months, at least 6 months, worse in the last 2-3 months.  No pain twisting.  Has been taking tylenol w/o relief.  No leg pain.    By the way, "I've been blacking out."  She can feel an occ "trembling" in her heart.  I didn't know about this until today.  Unclear duration, likely months ago and not in the meantime.  She has prodrome of sx.  No exertional CP.  Not SOB.  She isn't driving, hasn't in years. Advised not to drive.    Daughter has colon cancer, with ongoing radiation and chemo with surgery pending.  D/w pt.    Meds, vitals, and allergies reviewed.   ROS: Per HPI unless specifically indicated in ROS section

## 2023-12-14 NOTE — Patient Instructions (Addendum)
Go to the lab on the way out.   If you have mychart we'll likely use that to update you.    Take care.  Glad to see you. Careful on standing.  You should get a call about the heart monitor, to set that up.  Take 1-2 ibuprofen with food if needed for pain.  Take that up to 3 times per day with food.

## 2023-12-17 ENCOUNTER — Other Ambulatory Visit: Payer: Self-pay | Admitting: Family Medicine

## 2023-12-17 DIAGNOSIS — R55 Syncope and collapse: Secondary | ICD-10-CM | POA: Insufficient documentation

## 2023-12-17 DIAGNOSIS — R7989 Other specified abnormal findings of blood chemistry: Secondary | ICD-10-CM

## 2023-12-17 DIAGNOSIS — M549 Dorsalgia, unspecified: Secondary | ICD-10-CM | POA: Insufficient documentation

## 2023-12-17 NOTE — Assessment & Plan Note (Signed)
She has back pain is worse upon standing.  Sitting down helps.  Laying down resolved.  The concern is that she could have spinal stenosis.  Discussed options.  Reasonable to check plain films though that may not reveal spinal stenosis, discussed with patient.  We need to exclude other pathology first.  Fall cautions discussed with patient. Take 1-2 ibuprofen with food if needed for pain.  Take that up to 3 times per day with food.

## 2023-12-17 NOTE — Assessment & Plan Note (Addendum)
Of unclear source.  I just found out about this today.  Apparently no events in the past few months.  See notes on labs.  Advised not to drive.  Ambulatory cardiac monitor ordered.  Routine cautions given to patient.  Orthostatic cautions discussed.

## 2023-12-18 ENCOUNTER — Telehealth: Payer: Self-pay

## 2023-12-18 NOTE — Telephone Encounter (Signed)
Patient contacted and labs reviewed.

## 2023-12-18 NOTE — Telephone Encounter (Signed)
Copied from CRM [id number]. Topic: Clinical - Lab/Test Results >> Dec 18, 2023 11:32 AM Corin V wrote: Reason for CRM: Patient returned call to get lab results. Instructed patient that staff will call back with results from 12/16. Patient is okay with results being left on voicemail if they do not answer.

## 2023-12-20 ENCOUNTER — Telehealth: Payer: Self-pay | Admitting: Family Medicine

## 2023-12-20 NOTE — Telephone Encounter (Signed)
Please update patient.  See lab report note with need for follow-up labs.  I am awaiting radiology overread about her x-rays.  She has scoliosis and some degenerative changes noted.  I can incidentally see her old vena cava filter, as expected.  I do not see any fracture.  I would continue using Tylenol as needed for pain in the meantime, at least until we can get her follow-up labs and cardiac monitor resulted.  Please update me as needed about her back pain.  Thanks.

## 2023-12-21 ENCOUNTER — Telehealth: Payer: Self-pay | Admitting: Family Medicine

## 2023-12-21 NOTE — Telephone Encounter (Signed)
Return call addressed in another note

## 2023-12-21 NOTE — Telephone Encounter (Signed)
Spoke with patients daughter Selena Batten who is on the [place]. Patient is scheduled for labs on 12/24/23 and she is aware of lab results. Kim wanted to add that the patient is not able to urinate. Please advise

## 2023-12-21 NOTE — Telephone Encounter (Signed)
Patient returned call- requested a call back when able.

## 2023-12-21 NOTE — Telephone Encounter (Signed)
Copied from CRM [id number]. Topic: General - Call Back - No Documentation >> Dec 21, 2023  4:21 PM Alvino Blood C wrote: Reason for CRM: pt is requesting a cal back from [place], Mertie Moores

## 2023-12-21 NOTE — Telephone Encounter (Signed)
Left voicemail for patient to return call to office. 

## 2023-12-21 NOTE — Telephone Encounter (Signed)
Spoke with Selena Batten and advised of Dr Armanda Heritage suggestion. She did advise that she is going a little not a lot. She thinks her mom may getting more in her [place] pad then she realizes. Therefore she is going to monitor a little closer and if it get worse than she will take her to the ER.

## 2023-12-21 NOTE — Telephone Encounter (Signed)
If she can't urinate at all, then I would go to ER.  Thanks.

## 2023-12-22 NOTE — Telephone Encounter (Signed)
Noted. Thanks.

## 2023-12-24 ENCOUNTER — Other Ambulatory Visit (INDEPENDENT_AMBULATORY_CARE_PROVIDER_SITE_OTHER): Payer: Medicare Other

## 2023-12-24 DIAGNOSIS — R7989 Other specified abnormal findings of blood chemistry: Secondary | ICD-10-CM

## 2023-12-24 DIAGNOSIS — R55 Syncope and collapse: Secondary | ICD-10-CM

## 2023-12-24 LAB — COMPREHENSIVE METABOLIC PANEL
ALT: 24 U/L (ref 0–35)
AST: 28 U/L (ref 0–37)
Albumin: 4.1 g/dL (ref 3.5–5.2)
Alkaline Phosphatase: 88 U/L (ref 39–117)
BUN: 29 mg/dL — ABNORMAL HIGH (ref 6–23)
CO2: 27 meq/L (ref 19–32)
Calcium: 9.7 mg/dL (ref 8.4–10.5)
Chloride: 106 meq/L (ref 96–112)
Creatinine, Ser: 1.27 mg/dL — ABNORMAL HIGH (ref 0.40–1.20)
GFR: 40.41 mL/min — ABNORMAL LOW (ref >60.00)
Glucose, Bld: 94 mg/dL (ref 70–99)
Potassium: 5 meq/L (ref 3.5–5.1)
Sodium: 141 meq/L (ref 135–145)
Total Bilirubin: 0.5 mg/dL (ref 0.2–1.2)
Total Protein: 7 g/dL (ref 6.0–8.3)

## 2024-01-07 DIAGNOSIS — R55 Syncope and collapse: Secondary | ICD-10-CM | POA: Diagnosis not present

## 2024-01-12 ENCOUNTER — Telehealth: Payer: Self-pay | Admitting: Family Medicine

## 2024-01-12 NOTE — Telephone Encounter (Signed)
 Patient daughter kim called back she wants the nurse to leave a vm detailed vm if she does not answer when the nurse calls back

## 2024-01-13 ENCOUNTER — Other Ambulatory Visit: Payer: Self-pay | Admitting: Family Medicine

## 2024-01-13 NOTE — Telephone Encounter (Signed)
 Left message that I need for her to return my call. Dr. Vallarie Gauze needs to verify dose of medication that she is taking of lexapro  as well as to see if she is having any syncope or near syncope issues. Also to advise that no sustained abnormal heart rhythym on monitor. Closing this encounter due to other encounters open

## 2024-01-13 NOTE — Telephone Encounter (Signed)
 Please get update on patient- per EMR she is taking 20mg  dose.    Please see about status re: heart monitor results.  Thanks.

## 2024-01-13 NOTE — Telephone Encounter (Signed)
 Left voicemail for patient to return call to office.  Need to verify what dose patient is taking of Lexapro 

## 2024-01-13 NOTE — Telephone Encounter (Signed)
 Last office visit: 12/14/23 Next office visit: nothing scheduled Last refill:  ESCITALOPRAM  10 MG TABLET 04/07/23 90 tablets 3 refills

## 2024-01-14 ENCOUNTER — Other Ambulatory Visit: Payer: Self-pay | Admitting: Family Medicine

## 2024-01-14 DIAGNOSIS — R55 Syncope and collapse: Secondary | ICD-10-CM

## 2024-01-14 MED ORDER — ESCITALOPRAM OXALATE 20 MG PO TABS: 20.0000 mg | ORAL_TABLET | Freq: Every day | ORAL | 1 refills | Status: DC

## 2024-01-14 NOTE — Telephone Encounter (Signed)
Copied from CRM [id number]. Topic: Clinical - Medical Advice >> Jan 14, 2024  3:27 PM Isabell A wrote: Reason for CRM: Kim returning call in regard to patient's medication Lexapro, would like to speak with [place].

## 2024-01-14 NOTE — Telephone Encounter (Signed)
Rx addressed on result note.

## 2024-01-14 NOTE — Telephone Encounter (Signed)
Patient is doing 1 tablet of 20mg  daily

## 2024-01-15 NOTE — Telephone Encounter (Signed)
This issue has been addressed closing encounter

## 2024-01-31 NOTE — Progress Notes (Deleted)
 Cardiology Office Note  Date:  01/31/2024   ID:  Shandora, Koogler 1946/01/27, MRN [id number]  PCP:  Joaquim Nam, MD   No chief complaint on file.   HPI:  [name] is a 78 year old woman with past medical history of Chronic back pain Restless leg, leg weakness, uses a rollator History of falls Rheumatoid arthritis Hypothyroidism Who presents by referral from Dr. Para March for syncope  On visit with primary care December 14, 2023 reported remote history of " blacking out" Occasional " trembling" in her heart No events in the past few months  History of falls, uses a walker/rollator  7-day monitor was ordered showing rare short episodes of SVT, no A-fib  Echo 2018 with normal ejection fraction  PMH:   has a past medical history of Abnormal involuntary movements(781.0), Anemia of other chronic disease, Depressive disorder, not elsewhere classified, Diaphragmatic hernia without mention of obstruction or gangrene, Dysthymic disorder, Generalized hyperhidrosis, GERD (gastroesophageal reflux disease), H/O hiatal hernia, History of pulmonary embolus (PE), Lymphoma (HCC) (03/2010), Meniere's disease of left ear, Migraine, Other rheumatoid arthritis with visceral or systemic involvement, Palpitations, Personal history of other disorders of nervous system and sense organs, Systemic lupus erythematosus (HCC), Unspecified disease of pericardium, Unspecified hypothyroidism, and Urticaria, unspecified.  PSH:    Past Surgical History:  Procedure Laterality Date   ABDOMINAL HYSTERECTOMY  1978   APPENDECTOMY  1974   BREAST BIOPSY  03/2010   left breast;    BREAST LUMPECTOMY  03/2010   left breast; nonHodgkins lymphoma; "not breast cancer"   CHOLECYSTECTOMY  1974   ESOPHAGEAL DILATION  ?02/2011   ESOPHAGOGASTRODUODENOSCOPY  01-24-11   Slight stenosis esoph spasm   IR IVC FILTER PLMT / S&I /IMG GUID/MOD SED  12/17/2017   Left VATS  12/1997   LUMBAR DISC SURGERY  1977   shunt      put in behind left ear ; Menier's syndrome    Current Outpatient Medications  Medication Sig Dispense Refill   acetaminophen (TYLENOL) 500 MG tablet Take 500 mg by mouth every 6 (six) hours as needed.     Cholecalciferol (VITAMIN D3) 25 MCG (1000 UT) CAPS Take 1,000 Units by mouth daily with breakfast.     escitalopram (LEXAPRO) 20 MG tablet Take 1 tablet (20 mg total) by mouth daily. 90 tablet 1   levothyroxine (SYNTHROID) 112 MCG tablet Take 1 tablet (112 mcg total) by mouth daily before breakfast. Except skip Sunday. 6 tabs a week. 90 tablet 3   nystatin (MYCOSTATIN/NYSTOP) powder Apply 1 Application topically 2 (two) times daily. 30 g 1   Polyethyl Glyc-Propyl Glyc PF (SYSTANE PRESERVATIVE FREE) 0.4-0.3 % SOLN Place 1-2 drops into both eyes 4 (four) times daily. Place 1-2 drops into both eyes four times a day due to Sjgren's syndrome     pramipexole (MIRAPEX) 0.125 MG tablet TAKE 1 TABLET BY MOUTH AT BEDTIME. TO REPLACE REQUIP 90 tablet 2   No current facility-administered medications for this visit.     Allergies:   Heparin   Social History:  The patient  reports that she has never smoked. She has never used smokeless tobacco. She reports that she does not drink alcohol and does not use drugs.   Family History:   family history includes Breast cancer in her maternal aunt; COPD in her sister; Cancer in her sister, sister, and sister; Coronary artery disease in her brother, sister, sister, and sister; Diabetes in her brother; Heart failure in her brother, mother, and  sister; Stroke in her father.    Review of Systems: ROS   PHYSICAL EXAM: VS:  There were no vitals taken for this visit. , BMI There is no height or weight on file to calculate BMI. GEN: Well nourished, well developed, in no acute distress HEENT: normal Neck: no JVD, carotid bruits, or masses Cardiac: RRR; no murmurs, rubs, or gallops,no edema  Respiratory:  clear to auscultation bilaterally, normal work of  breathing GI: soft, nontender, nondistended, + BS MS: no deformity or atrophy Skin: warm and dry, no rash Neuro:  Strength and sensation are intact Psych: euthymic mood, full affect    Recent Labs: 12/14/2023: Hemoglobin 14.3; Platelets 167.0; TSH 0.52 12/24/2023: ALT 24; BUN 29; Creatinine, Ser 1.27; Potassium 5.0; Sodium 141    Lipid Panel Lab Results  Component Value Date   CHOL 173 03/31/2023   HDL 44.60 03/31/2023   LDLCALC 104 (H) 03/31/2023   TRIG 123.0 03/31/2023      Wt Readings from Last 3 Encounters:  12/14/23 208 lb 6.4 oz (94.5 kg)  04/07/23 204 lb (92.5 kg)  04/07/23 200 lb (90.7 kg)       ASSESSMENT AND PLAN:  Problem List Items Addressed This Visit   None    Disposition:   F/U  12 months   Total encounter time more than 30 minutes  Greater than 50% was spent in counseling and coordination of care with the patient    Signed, Dossie Arbour, M.D., Ph.D. [facility] [place], [place] [phone]

## 2024-02-01 ENCOUNTER — Ambulatory Visit: Payer: Medicare Other | Admitting: Cardiovascular Disease

## 2024-02-01 DIAGNOSIS — Z86718 Personal history of other venous thrombosis and embolism: Secondary | ICD-10-CM

## 2024-02-01 DIAGNOSIS — R55 Syncope and collapse: Secondary | ICD-10-CM

## 2024-02-01 DIAGNOSIS — R0609 Other forms of dyspnea: Secondary | ICD-10-CM

## 2024-02-01 DIAGNOSIS — Z86711 Personal history of pulmonary embolism: Secondary | ICD-10-CM

## 2024-02-19 NOTE — Progress Notes (Signed)
 Cardiology Office Note    Date:  02/22/2024   ID:  Evon, Dejarnett November 04, 1946, MRN [id number]  PCP:  Joaquim Nam, MD  Cardiologist:  New Electrophysiologist:  None   Chief Complaint: Near syncope/syncope  History of Present Illness:   MILLER EDGINGTON is a 78 y.o. female with history of Sjogren's and lupus, bilateral DVTs and PE in 2018 complicated by left rectus abdominis hematoma while on rivaroxaban status post IVC filter, pericarditis, symptomatic PACs, aortic atherosclerosis, lymphoma, hypothyroidism, hepatic steatosis, Mnire's disease, GERD, and chronic back pain who presents for evaluation of syncope and near syncope.  She was previously followed by Drs. Myrtis Ser and Allred.  She was most recently evaluated by Dr. Duke Salvia in 2016 for abnormal EKG with concern for atrial flutter, with changes felt to be related to her resting tremor and not atrial flutter.  She also reported shortness of breath with echo in 10/2015 showed an EF of 60 to 65%, mild LVH, no regional wall motion normalities, grade 1 diastolic dysfunction, mild aortic insufficiency, trivial mitral regurgitation, and normal RV systolic function and ventricular cavity size.  Lexiscan MPI in 10/2015 showed no significant ischemia or scar and was overall low risk with an EF of 55 to 65%.  Echo in 11/2017, during admission for showed an EF of 60 to 65%, no regional wall motion abnormalities, mild LVH, grade 1 diastolic dysfunction, trivial mitral regurgitation, normal RV systolic function.  She was seen at her PCPs office in 11/2023 for for follow-up of back pain.  Note indicates "by the way, I have been blacking out."  She reported she could feel a "trembling" in her heart.  Duration was unclear with most recent event felt to be months prior.  She was without symptoms of angina or cardiac decompensation.  This was the first time her PCP had been made aware of these episodes.  Labs were notable for mild renal dysfunction.   Zio patch in 11/2023 showed a predominant rhythm of sinus with an average rate of 62 bpm (range 45 to 158 bpm), 1 run of NSVT lasting 4 beats, 13 episodes of SVT lasting up to 18 beats, and rare supraventricular and ventricular ectopy.  No sustained arrhythmias, prolonged pauses, or evidence of high-grade AV block.  She comes in accompanied by family member.  She reports an episode of syncope that occurred 3 to 4 months prior with associated prodrome of near syncope.  Patient was able to get to a chair and subsequently came to on the floor.  Episode occurred in the setting of increased lower back pain.  Episode was not precipitated by chest pain, palpitations, dyspnea, flushing, or loss of bowel/bladder function.  Episode was not witnessed.  In total, her family member reports the patient may have had upwards of 4-5 syncopal episodes over the past 10 years.  Since this most recent episode, she has noted some episodes of near syncope with positional change.  She has been without frank syncope.  During these episodes of near syncope she is without chest pain, palpitations, dyspnea, or flushing.  She will go lay down and the symptoms will resolve.  She drinks approximately 30 ounces of liquids daily.  She is largely sedentary due to significant lower extremity weakness and imbalance.   Labs independently reviewed: 11/2023 - potassium 5.0, BUN 29, serum creatinine 1.27, albumin 4.1, AST/ALT normal, TSH normal, Hgb 14.3, PLT 167 03/2023 - TC 173, TG 123, HDL 44, LDL 104  Past Medical History:  Diagnosis Date   Abnormal involuntary movements(781.0)    Anemia of other chronic disease    Depressive disorder, not elsewhere classified    Diaphragmatic hernia without mention of obstruction or gangrene    Dysthymic disorder    Generalized hyperhidrosis    GERD (gastroesophageal reflux disease)    H/O hiatal hernia    History of pulmonary embolus (PE)    Lymphoma (HCC) 03/2010   "they got it all w/surgery"    Meniere's disease of left ear    Migraine    "used to have classic kind; not anymore; last one was in 2012"   Other rheumatoid arthritis with visceral or systemic involvement    Palpitations    Personal history of other disorders of nervous system and sense organs    Systemic lupus erythematosus (HCC)    Unspecified disease of pericardium    Pericarditis    Unspecified hypothyroidism    Urticaria, unspecified     Past Surgical History:  Procedure Laterality Date   ABDOMINAL HYSTERECTOMY  1978   APPENDECTOMY  1974   BREAST BIOPSY  03/2010   left breast;    BREAST LUMPECTOMY  03/2010   left breast; nonHodgkins lymphoma; "not breast cancer"   CHOLECYSTECTOMY  1974   ESOPHAGEAL DILATION  ?02/2011   ESOPHAGOGASTRODUODENOSCOPY  01-24-11   Slight stenosis esoph spasm   IR IVC FILTER PLMT / S&I /IMG GUID/MOD SED  12/17/2017   Left VATS  12/1997   LUMBAR DISC SURGERY  1977   shunt     put in behind left ear ; Menier's syndrome    Current Medications: Current Meds  Medication Sig   acetaminophen (TYLENOL) 500 MG tablet Take 500 mg by mouth every 6 (six) hours as needed.   Cholecalciferol (VITAMIN D3) 25 MCG (1000 UT) CAPS Take 1,000 Units by mouth daily with breakfast.   escitalopram (LEXAPRO) 20 MG tablet Take 1 tablet (20 mg total) by mouth daily.   levothyroxine (SYNTHROID) 112 MCG tablet Take 1 tablet (112 mcg total) by mouth daily before breakfast. Except skip Sunday. 6 tabs a week.   nystatin (MYCOSTATIN/NYSTOP) powder Apply 1 Application topically 2 (two) times daily.   Polyethyl Glyc-Propyl Glyc PF (SYSTANE PRESERVATIVE FREE) 0.4-0.3 % SOLN Place 1-2 drops into both eyes 4 (four) times daily. Place 1-2 drops into both eyes four times a day due to Sjgren's syndrome   pramipexole (MIRAPEX) 0.125 MG tablet TAKE 1 TABLET BY MOUTH AT BEDTIME. TO REPLACE REQUIP    Allergies:   Heparin   Social History   Socioeconomic History   Marital status: Widowed    Spouse name: Not on file    Number of children: 2   Years of education: Not on file   Highest education level: Not on file  Occupational History   Occupation: Disabled     Employer: RETIRED  Tobacco Use   Smoking status: Never   Smokeless tobacco: Never  Vaping Use   Vaping status: Never Used  Substance and Sexual Activity   Alcohol use: No   Drug use: No   Sexual activity: Never  Other Topics Concern   Not on file  Social History Narrative   Ms. Salomon lives in [place], [place].    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    Social Drivers of  Health   Financial Resource Strain: Low Risk  (04/07/2023)   Overall Financial Resource Strain (CARDIA)    Difficulty of Paying Living Expenses: Not hard at all  Food Insecurity: No Food Insecurity (04/07/2023)   Hunger Vital Sign    Worried About Running Out of Food in the Last Year: Never true    Ran Out of Food in the Last Year: Never true  Transportation Needs: No Transportation Needs (04/07/2023)   PRAPARE - Administrator, Civil Service (Medical): No    Lack of Transportation (Non-Medical): No  Physical Activity: Not on file  Stress: No Stress Concern Present (04/07/2023)   [employer] of Occupational Health - Occupational Stress Questionnaire    Feeling of Stress : Not at all  Social Connections: Socially Isolated (04/07/2023)   Social Connection and Isolation Panel [NHANES]    Frequency of Communication with Friends and Family: Twice a week    Frequency of Social Gatherings with Friends and Family: More than three times a week    Attends Religious Services: Never    Database administrator or Organizations: No    Attends Banker Meetings: Never    Marital Status: Widowed     Family History:  The patient's family history includes Breast cancer in her maternal  aunt; COPD in her sister; Cancer in her sister, sister, and sister; Coronary artery disease in her brother, sister, sister, and sister; Diabetes in her brother; Heart failure in her brother, mother, and sister; Stroke in her father. There is no history of Colon cancer.  ROS:   12-point review of systems is negative unless otherwise noted in the HPI.   EKGs/Labs/Other Studies Reviewed:    Studies reviewed were summarized above. The additional studies were reviewed today:  Zio patch 11/2023: HR 45 - 158, average 62 bpm. 1 nonsustained VT lasting 4 beats. 13 nonsustained SVT, longest 18 beats. Rare supraventricular and ventricular ectopy. No atrial fibrillation. No sustained arrhythmias. __________  2D echo 12/13/2017: - Left ventricle: The cavity size was normal. Wall thickness was    increased in a pattern of mild LVH. Systolic function was normal.    The estimated ejection fraction was in the range of 60% to 65%.    Wall motion was normal; there were no regional wall motion    abnormalities. Doppler parameters are consistent with abnormal    left ventricular relaxation (grade 1 diastolic dysfunction).  - Aortic valve: Mildly calcified annulus. Trileaflet.  - Mitral valve: There was trivial regurgitation.  - Right ventricle: Systolic function was normal.  - Tricuspid valve: There was trivial regurgitation.  - Pulmonary arteries: Systolic pressure could not be accurately    estimated.  - Pericardium, extracardiac: A prominent pericardial fat pad was    present.   Impressions:   - Images are limited. Mild LVH with LVEF 60-65% and grade 1    diastolic dysfunction. Mildly calcified aortic annulus. Trivial    mitral and tricuspid regurgitation. Grossly normal right    ventricular contraction. Prominent pericardial fat-pad.  __________  2D echo 11/27/2015: - Left ventricle: The cavity size was normal. There was mild    concentric hypertrophy. Systolic function was normal. The     estimated ejection fraction was in the range of 60% to 65%. Wall    motion was normal; there were no regional wall motion    abnormalities. Doppler parameters are consistent with abnormal    left ventricular relaxation (grade 1 diastolic dysfunction).  - Aortic  valve: There was mild regurgitation.  - Mitral valve: There was trivial regurgitation.  - Right ventricle: The cavity size was normal. Wall thickness was    normal. Systolic function was normal.  __________  Eugenie Birks MPI 11/20/2015: Nuclear stress EF: 59%. The left ventricular ejection fraction is normal (55-65%). There was no ST segment deviation noted during stress. The study is normal. This is a low risk study. Normal myocardial perfusion without scar or ischemia.   Normal lexiscan nuclear stress test demonstrating normal myocardial perfusion and function: EF 59%. __________  2D echo 08/04/2013: - Left ventricle: The cavity size was normal. Wall thickness    was increased in a pattern of mild LVH. Systolic function    was normal. The estimated ejection fraction was in the    range of 55% to 65%. Wall motion was normal; there were no    regional wall motion abnormalities. Doppler parameters are    consistent with abnormal left ventricular relaxation    (grade 1 diastolic dysfunction).  - Aortic valve: Mild regurgitation.  - Mitral valve: Mild regurgitation.  - Atrial septum: No defect or patent foramen ovale was    identified.  ___________  2D echo 04/21/2012: - Left ventricle: The cavity size was normal. Wall thickness    was normal. Systolic function was normal. The estimated    ejection fraction was in the range of 60% to 65%. Wall    motion was normal; there were no regional wall motion    abnormalities. Doppler parameters are consistent with    abnormal left ventricular relaxation (grade 1 diastolic    dysfunction).  - Aortic valve: Mild regurgitation.    EKG:  EKG is ordered today.  The EKG ordered today  demonstrates NSR with sinus arrhythmia, 79 bpm, LVH, poor R wave progression along the precordial leads, possible prior inferior infarct versus artifact, no acute ST-T changes  Recent Labs: 12/14/2023: Hemoglobin 14.3; Platelets 167.0; TSH 0.52 12/24/2023: ALT 24; BUN 29; Creatinine, Ser 1.27; Potassium 5.0; Sodium 141  Recent Lipid Panel    Component Value Date/Time   CHOL 173 03/31/2023 0828   TRIG 123.0 03/31/2023 0828   HDL 44.60 03/31/2023 0828   CHOLHDL 4 03/31/2023 0828   VLDL 24.6 03/31/2023 0828   LDLCALC 104 (H) 03/31/2023 0828    PHYSICAL EXAM:    VS:  BP 120/83 (BP Location: Left Arm, Patient Position: Sitting, Cuff Size: Normal)   Pulse 79   Ht 5\' 5"  (1.651 m)   Wt 212 lb 4 oz (96.3 kg)   SpO2 96%   BMI 35.32 kg/m   BMI: Body mass index is 35.32 kg/m.  Physical Exam Vitals reviewed.  Constitutional:      Appearance: She is well-developed.  HENT:     Head: Normocephalic and atraumatic.  Eyes:     General:        Right eye: No discharge.        Left eye: No discharge.  Neck:     Vascular: No JVD.  Cardiovascular:     Rate and Rhythm: Normal rate and regular rhythm.     Heart sounds: Normal heart sounds, S1 normal and S2 normal. Heart sounds not distant. No midsystolic click and no opening snap. No murmur heard.    No friction rub.  Pulmonary:     Effort: Pulmonary effort is normal. No respiratory distress.     Breath sounds: Normal breath sounds. No decreased breath sounds, wheezing, rhonchi or rales.  Chest:  Chest wall: No tenderness.  Musculoskeletal:     Cervical back: Normal range of motion.     Right lower leg: No edema.     Left lower leg: No edema.  Skin:    General: Skin is warm and dry.     Nails: There is no clubbing.  Neurological:     Mental Status: She is alert and oriented to person, place, and time.  Psychiatric:        Speech: Speech normal.        Behavior: Behavior normal.        Thought Content: Thought content normal.         Judgment: Judgment normal.     Wt Readings from Last 3 Encounters:  02/22/24 212 lb 4 oz (96.3 kg)  12/14/23 208 lb 6.4 oz (94.5 kg)  04/07/23 204 lb (92.5 kg)     Orthostatic vital signs:  Lying: 134/75, 78 bpm Sitting: 96/63, 85 bpm, little dizzy Standing: 100/71, 93 bpm, wobbly, legs weak Standing x 3 minutes: Unable to tolerate  ASSESSMENT & PLAN:   Syncope: Positive orthostatic vital signs in the office today as outlined above.  Query if episode of syncope 3 to 4 months prior was vasovagal in the setting of increased pain.  Initial labs in December 2024 also notable for mild dehydration with patient reporting drinking approximately 30 ounces of liquid daily.  Obtain echo to evaluate for cardiomyopathy.  Obtain carotid artery ultrasound.  Increase water intake.  Compression socks recommended.  Recent Zio patch without evidence of sustained arrhythmia, high-grade AV block, or prolonged pauses.  If the above imaging is unrevealing, consider myocardial PET/CT to evaluate for high risk ischemia.  Patient does not drive.  Aortic atherosclerosis/HLD: LDL 104 in 05/2023 with target LDL less than 70 with noted aortic atherosclerosis on CT imaging.  Not currently on statin.  Revisit in follow-up.  History of bilateral DVTs and PE: Status post IVC filter in 2018.    Lower extremity weakness/imbalance.  Patient may benefit from home health PT.  Will defer to PCP.    Disposition: F/u with me in 2 months.   Medication Adjustments/Labs and Tests Ordered: Current medicines are reviewed at length with the patient today.  Concerns regarding medicines are outlined above. Medication changes, Labs and Tests ordered today are summarized above and listed in the Patient Instructions accessible in Encounters.   Signed, Eula Listen, PA-C 02/22/2024 3:32 PM     [facility] [place] [address] [place], [place] [address] [phone]

## 2024-02-22 ENCOUNTER — Ambulatory Visit: Payer: Medicare Other | Attending: Physician Assistant | Admitting: Physician Assistant

## 2024-02-22 ENCOUNTER — Encounter: Payer: Self-pay | Admitting: Physician Assistant

## 2024-02-22 VITALS — BP 120/83 | HR 79 | Ht 65.0 in | Wt 212.2 lb

## 2024-02-22 DIAGNOSIS — Z86711 Personal history of pulmonary embolism: Secondary | ICD-10-CM

## 2024-02-22 DIAGNOSIS — R2689 Other abnormalities of gait and mobility: Secondary | ICD-10-CM

## 2024-02-22 DIAGNOSIS — E785 Hyperlipidemia, unspecified: Secondary | ICD-10-CM | POA: Diagnosis not present

## 2024-02-22 DIAGNOSIS — R29898 Other symptoms and signs involving the musculoskeletal system: Secondary | ICD-10-CM | POA: Diagnosis not present

## 2024-02-22 DIAGNOSIS — I700 Atherosclerosis of aorta: Secondary | ICD-10-CM | POA: Diagnosis not present

## 2024-02-22 DIAGNOSIS — R55 Syncope and collapse: Secondary | ICD-10-CM | POA: Diagnosis not present

## 2024-02-22 DIAGNOSIS — Z86718 Personal history of other venous thrombosis and embolism: Secondary | ICD-10-CM

## 2024-02-22 NOTE — Patient Instructions (Signed)
 Medication Instructions:  - Your physician recommends that you continue on your current medications as directed. Please refer to the Current Medication list given to you today.  *If you need a refill on your cardiac medications before your next appointment, please call your pharmacy*   Lab Work: - none ordered  If you have labs (blood work) drawn today and your tests are completely normal, you will receive your results only by: MyChart Message (if you have MyChart) OR A paper copy in the mail If you have any lab test that is abnormal or we need to change your treatment, we will call you to review the results.   Testing/Procedures: 1) Echocardiogram: - Your physician has requested that you have an echocardiogram. Echocardiography is a painless test that uses sound waves to create images of your heart. It provides your doctor with information about the size and shape of your heart and how well your heart's chambers and valves are working. This procedure takes approximately one hour. There are no restrictions for this procedure. Please do NOT wear cologne, perfume, aftershave, or lotions (deodorant is allowed). Please arrive 15 minutes prior to your appointment time.  There is a possibility that an IV may need to be started during your test to inject an image enhancing agent. This is done to obtain more optimal pictures of your heart. Therefore we ask that you do at least drink some water prior to coming in to hydrate your veins.    Please note: We ask at that you not bring children with you during ultrasound (echo/ vascular) testing. Due to room size and safety concerns, children are not allowed in the ultrasound rooms during exams. Our front office staff cannot provide observation of children in our lobby area while testing is being conducted. An adult accompanying a patient to their appointment will only be allowed in the ultrasound room at the discretion of the ultrasound technician under  special circumstances. We apologize for any inconvenience.   2) Carotid Duplex:   Your physician has requested that you have a carotid duplex. This test is an ultrasound of the carotid arteries in your neck. It looks at blood flow through these arteries that supply the brain with blood. Allow one hour for this exam. There are no restrictions or special instructions.  Follow-Up: At [facility], you and your health needs are our priority.  As part of our continuing mission to provide you with exceptional heart care, we have created designated Provider Care Teams.  These Care Teams include your primary Cardiologist (physician) and Advanced Practice Providers (APPs -  Physician Assistants and Nurse Practitioners) who all work together to provide you with the care you need, when you need it.  We recommend signing up for the patient portal called "MyChart".  Sign up information is provided on this After Visit Summary.  MyChart is used to connect with patients for Virtual Visits (Telemedicine).  Patients are able to view lab/test results, encounter notes, upcoming appointments, etc.  Non-urgent messages can be sent to your provider as well.   To learn more about what you can do with MyChart, go to [email or web address].    Your next appointment:   2 month(s)  Provider:   Eula Listen, PA-C    Other Instructions  Echocardiogram An echocardiogram is a test that uses sound waves to make images of your heart. This way of making images is often called ultrasound. The images from this test can help find out many things about your  heart, including: The size and shape of your heart. The strength of your heart muscle and how well it's working. The size, thickness, and movement of your heart's walls. How your heart valves are working. Problems such as: A tumor or a growth from an infection around the heart valves. Areas of heart muscle that aren't working well because of poor blood flow or  injury from a heart attack. An aneurysm. This is a weak or damaged part of an artery wall. An artery is a blood vessel. Tell a health care provider about: Any allergies you have. All medicines you're taking, including vitamins, herbs, eye drops, creams, and over-the-counter medicines. Any bleeding problems you have. Any surgeries you've had. Any medical problems you have. Whether you're pregnant or may be pregnant. What are the risks? Your health care provider will talk with you about risks. These may include an allergic reaction to IV dye that may be used during the test. What happens before the test? You don't need to do anything to get ready for this test. You may eat and drink normally. What happens during the test?  You'll take off your clothes from the waist up and put on a hospital gown. Sticky patches called electrodes may be placed on your chest. These will be connected to a machine that monitors your heart rate and rhythm. You'll lie down on a table for the exam. A wand covered in gel will be moved over your chest. Sound waves from the wand will go to your heart and bounce back--or "echo" back. The sound waves will go to a computer that uses them to make images of your heart. The images can be viewed on a monitor. The images will also be recorded on the computer so your provider can look at them later. You may be asked to change positions or hold your breath for a short time. This makes it easier to get different views or better views of your heart. In some cases, you may be given a dye through an IV. The IV is put into one of your veins. This dye can make the areas of your heart easier to see. The procedure may vary among providers and hospitals. What can I expect after the test? You may return to your normal diet, activities, and medicines unless your provider tells you not to. If an IV was placed for the test, it will be removed. It's up to you to get the results of your test.  Ask your provider, or the department that's doing the test, when your results will be ready. This information is not intended to replace advice given to you by your health care provider. Make sure you discuss any questions you have with your health care provider. Document Revised: 02/13/2023 Document Reviewed: 02/13/2023 Elsevier Patient Education  2024 [employer].

## 2024-03-24 ENCOUNTER — Other Ambulatory Visit: Payer: Medicare Other

## 2024-03-24 ENCOUNTER — Ambulatory Visit: Payer: Medicare Other

## 2024-04-07 ENCOUNTER — Ambulatory Visit: Payer: Medicare Other

## 2024-04-07 VITALS — Ht 65.0 in | Wt 212.0 lb

## 2024-04-07 DIAGNOSIS — Z0000 Encounter for general adult medical examination without abnormal findings: Secondary | ICD-10-CM

## 2024-04-07 NOTE — Patient Instructions (Signed)
 [name] , Thank you for taking time to come for your Medicare Wellness Visit. I appreciate your ongoing commitment to your health goals. Please review the following plan we discussed and let me know if I can assist you in the future.   Referrals/Orders/Follow-Ups/Clinician Recommendations: none  This is a list of the screening recommended for you and due dates:  Health Maintenance  Topic Date Due   Zoster (Shingles) Vaccine (1 of 2) Never done   DEXA scan (bone density measurement)  02/13/2010   DTaP/Tdap/Td vaccine (3 - Tdap) 03/12/2020   COVID-19 Vaccine (4 - 2024-25 season) 08/30/2023   Flu Shot  07/29/2024   Medicare Annual Wellness Visit  04/07/2025   Pneumonia Vaccine  Completed   Hepatitis C Screening  Completed   HPV Vaccine  Aged Out   Meningitis B Vaccine  Aged Out    Advanced directives: (Copy Requested) Please bring a copy of your health care power of attorney and living will to the office to be added to your chart at your convenience. You can mail to [facility] [address] [place], [place] [address] or email to [email or web address]  Next Medicare Annual Wellness Visit scheduled for next year: Yes 04/10/25 @ 8:50am

## 2024-04-07 NOTE — Progress Notes (Signed)
 Subjective:   Hannah Pitts is a 78 y.o. who presents for a Medicare Wellness preventive visit.  Visit Complete: Virtual I connected with  Hannah Pitts on 04/07/24 by a audio enabled telemedicine application and verified that I am speaking with the correct person using two identifiers.  Patient Location: Home  Provider Location: Home Office  I discussed the limitations of evaluation and management by telemedicine. The patient expressed understanding and agreed to proceed.  Vital Signs: Because this visit was a virtual/telehealth visit, some criteria may be missing or patient reported. Any vitals not documented were not able to be obtained and vitals that have been documented are patient reported.  VideoDeclined- This patient declined Librarian, academic. Therefore the visit was completed with audio only.  Persons Participating in Visit: Patient.  AWV Questionnaire: No: Patient Medicare AWV questionnaire was not completed prior to this visit.  Cardiac Risk Factors include: advanced age (>31men, >1 women);obesity (BMI >30kg/m2);sedentary lifestyle     Objective:    Today's Vitals   04/07/24 0857  Weight: 212 lb (96.2 kg)  Height: 5\' 5"  (1.651 m)   Body mass index is 35.28 kg/m.     04/07/2024    9:08 AM 04/07/2023    8:33 AM 04/18/2021    4:20 PM 12/12/2017    4:44 AM 03/23/2012    1:49 PM  Advanced Directives  Does Patient Have a Medical Advance Directive? Yes No No No Patient does not have advance directive;Patient would like information  Type of Advance Directive Living will      Would patient like information on creating a medical advance directive?  No - Patient declined No - Patient declined No - Patient declined Referral made to social work    Current Medications (verified) Outpatient Encounter Medications as of 04/07/2024  Medication Sig   acetaminophen (TYLENOL) 500 MG tablet Take 500 mg by mouth every 6 (six) hours as needed.    Cholecalciferol (VITAMIN D3) 25 MCG (1000 UT) CAPS Take 1,000 Units by mouth daily with breakfast.   escitalopram (LEXAPRO) 20 MG tablet Take 1 tablet (20 mg total) by mouth daily.   levothyroxine (SYNTHROID) 112 MCG tablet Take 1 tablet (112 mcg total) by mouth daily before breakfast. Except skip Sunday. 6 tabs a week.   nystatin (MYCOSTATIN/NYSTOP) powder Apply 1 Application topically 2 (two) times daily.   Polyethyl Glyc-Propyl Glyc PF (SYSTANE PRESERVATIVE FREE) 0.4-0.3 % SOLN Place 1-2 drops into both eyes 4 (four) times daily. Place 1-2 drops into both eyes four times a day due to Sjgren's syndrome   pramipexole (MIRAPEX) 0.125 MG tablet TAKE 1 TABLET BY MOUTH AT BEDTIME. TO REPLACE REQUIP   No facility-administered encounter medications on file as of 04/07/2024.    Allergies (verified) Heparin   History: Past Medical History:  Diagnosis Date   Abnormal involuntary movements(781.0)    Anemia of other chronic disease    Depressive disorder, not elsewhere classified    Diaphragmatic hernia without mention of obstruction or gangrene    Dysthymic disorder    Generalized hyperhidrosis    GERD (gastroesophageal reflux disease)    H/O hiatal hernia    History of pulmonary embolus (PE)    Lymphoma (HCC) 03/2010   "they got it all w/surgery"   Meniere's disease of left ear    Migraine    "used to have classic kind; not anymore; last one was in 2012"   Other rheumatoid arthritis with visceral or systemic involvement  Palpitations    Personal history of other disorders of nervous system and sense organs    Systemic lupus erythematosus (HCC)    Unspecified disease of pericardium    Pericarditis    Unspecified hypothyroidism    Urticaria, unspecified    Past Surgical History:  Procedure Laterality Date   ABDOMINAL HYSTERECTOMY  1978   APPENDECTOMY  1974   BREAST BIOPSY  03/2010   left breast;    BREAST LUMPECTOMY  03/2010   left breast; nonHodgkins lymphoma; "not breast cancer"    CHOLECYSTECTOMY  1974   ESOPHAGEAL DILATION  ?02/2011   ESOPHAGOGASTRODUODENOSCOPY  01-24-11   Slight stenosis esoph spasm   IR IVC FILTER PLMT / S&I /IMG GUID/MOD SED  12/17/2017   Left VATS  12/1997   LUMBAR DISC SURGERY  1977   shunt     put in behind left ear ; Menier's syndrome   Family History  Problem Relation Age of Onset   Heart failure Mother    Stroke Father    Coronary artery disease Sister    COPD Sister    Coronary artery disease Brother    Diabetes Brother    Heart failure Brother    Coronary artery disease Sister    Coronary artery disease Sister    Cancer Sister    Heart failure Sister    Cancer Sister        lung, esoph   Cancer Sister        lung   Breast cancer Maternal Aunt    Colon cancer Neg Hx    Social History   Socioeconomic History   Marital status: Widowed    Spouse name: Not on file   Number of children: 2   Years of education: Not on file   Highest education level: Not on file  Occupational History   Occupation: Disabled     Employer: RETIRED  Tobacco Use   Smoking status: Never   Smokeless tobacco: Never  Vaping Use   Vaping status: Never Used  Substance and Sexual Activity   Alcohol use: No   Drug use: No   Sexual activity: Never  Other Topics Concern   Not on file  Social History Narrative   Ms. Cantrell lives in [place], [place].    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    Social Drivers of Corporate investment banker Strain: Low Risk  (04/07/2024)   Overall Financial Resource Strain (CARDIA)    Difficulty of Paying Living Expenses: Not hard at all  Food Insecurity: No Food Insecurity (04/07/2024)   Hunger Vital Sign    Worried About Running Out of Food in the Last Year: Never true    Ran Out of Food in the Last Year: Never true  Transportation Needs:  No Transportation Needs (04/07/2024)   PRAPARE - Administrator, Civil Service (Medical): No    Lack of Transportation (Non-Medical): No  Physical Activity: Insufficiently Active (04/07/2024)   Exercise Vital Sign    Days of Exercise per Week: 3 days    Minutes of Exercise per Session: 30 min  Stress: No Stress Concern Present (04/07/2024)   [employer] of Occupational Health - Occupational Stress Questionnaire    Feeling of Stress : Not at all  Social Connections: Moderately Isolated (04/07/2024)  Social Advertising account executive [NHANES]    Frequency of Communication with Friends and Family: Twice a week    Frequency of Social Gatherings with Friends and Family: Once a week    Attends Religious Services: 1 to 4 times per year    Active Member of [employer] or Organizations: No    Attends Banker Meetings: Never    Marital Status: Widowed    Tobacco Counseling Counseling given: Not Answered    Clinical Intake:  Pre-visit preparation completed: Yes  Pain : No/denies pain     BMI - recorded: 35.28 Nutritional Status: BMI > 30  Obese Nutritional Risks: None Diabetes: No  Lab Results  Component Value Date   HGBA1C 5.6 02/27/2000     How often do you need to have someone help you when you read instructions, pamphlets, or other written materials from your doctor or pharmacy?: 1 - Never  Interpreter Needed?: No  Comments: daughter and grands lives with pt Information entered by :: B.Kerstyn Coryell,LPN   Activities of Daily Living     04/07/2024    9:11 AM  In your present state of health, do you have any difficulty performing the following activities:  Hearing? 1  Vision? 1  Difficulty concentrating or making decisions? 0  Walking or climbing stairs? 1  Dressing or bathing? 0  Doing errands, shopping? 1  Preparing Food and eating ? N  Using the Toilet? N  In the past six months, have you accidently leaked urine? N  Do you have problems  with loss of bowel control? N  Managing your Medications? N  Managing your Finances? N  Housekeeping or managing your Housekeeping? Y    Patient Care Team: Joaquim Nam, MD as PCP - General (Family Medicine) Kathyrn Sheriff, [facility] (Inactive) as Pharmacist (Pharmacist)  Indicate any recent Medical Services you may have received from other than [facility] providers in the past year (date may be approximate).     Assessment:   This is a routine wellness examination for [place].  Hearing/Vision screen Hearing Screening - Comments:: Pt is hard of hearing due to roaring in ear;says she is getting hearing aids;cannot hear in rt ear Vision Screening - Comments:: Pt says her vision is good   Goals Addressed             This Visit's Progress    COMPLETED: Patient Stated   Not on track    Walk more.       Depression Screen     04/07/2024    9:06 AM 12/14/2023   10:11 AM 04/07/2023   10:43 AM 04/07/2023    8:32 AM 09/15/2019    2:43 PM 04/28/2017    9:57 AM 08/02/2015    2:22 PM  PHQ 2/9 Scores  PHQ - 2 Score 0 2 4 0 0 2 2  PHQ- 9 Score  16 16        Fall Risk     04/07/2024    9:02 AM 12/14/2023   10:11 AM 04/07/2023   10:43 AM 04/07/2023    8:34 AM 04/01/2022   10:33 AM  Fall Risk   Falls in the past year? 1 1 1 1 1   Number falls in past yr: 0 1 1 1 1   Injury with Fall? 0 0 1 0 0  Risk for fall due to : No Fall Risks History of fall(s) Impaired balance/gait Impaired balance/gait;Impaired mobility History of fall(s);Impaired balance/gait  Follow up Education provided;Falls prevention discussed  Falls evaluation completed Falls evaluation completed Falls evaluation completed;Education provided;Falls prevention discussed Falls evaluation completed    MEDICARE RISK AT HOME:  Medicare Risk at Home Any stairs in or around the home?: Yes (ramp) If so, are there any without handrails?: No Home free of loose throw rugs in walkways, pet beds, electrical cords, etc?: Yes Adequate  lighting in your home to reduce risk of falls?: Yes Life alert?: No Use of a cane, walker or w/c?: Yes (cane;walker/rollater) Grab bars in the bathroom?: Yes Shower chair or bench in shower?: Yes Elevated toilet seat or a handicapped toilet?: Yes  TIMED UP AND GO:  Was the test performed?  No  Cognitive Function: 6CIT completed        04/07/2024    9:16 AM 04/07/2023    8:37 AM  6CIT Screen  What Year? 0 points 0 points  What month? 0 points 0 points  What time? 0 points 0 points  Count back from 20 0 points 0 points  Months in reverse 0 points 0 points  Repeat phrase 0 points 0 points  Total Score 0 points 0 points    Immunizations Immunization History  Administered Date(s) Administered   Influenza Whole 09/28/2005, 11/29/2007   Influenza, High Dose Seasonal PF 12/13/2017   Influenza, Quadrivalent, Recombinant, Inj, Pf 10/24/2021   Influenza,inj,Quad PF,6+ Mos 10/18/2013   Influenza-Unspecified 09/09/2019   PFIZER(Purple Top)SARS-COV-2 Vaccination 06/16/2020, 07/07/2020, 01/09/2021   Pneumococcal Conjugate-13 08/02/2015   Pneumococcal Polysaccharide-23 07/29/1998, 07/26/2012   Td 11/28/1997, 03/12/2010    Screening Tests Health Maintenance  Topic Date Due   Zoster Vaccines- Shingrix (1 of 2) Never done   DEXA SCAN  02/13/2010   DTaP/Tdap/Td (3 - Tdap) 03/12/2020   COVID-19 Vaccine (4 - 2024-25 season) 08/30/2023   INFLUENZA VACCINE  07/29/2024   Medicare Annual Wellness (AWV)  04/07/2025   Pneumonia Vaccine 19+ Years old  Completed   Hepatitis C Screening  Completed   HPV VACCINES  Aged Out   Meningococcal B Vaccine  Aged Out    Health Maintenance  Health Maintenance Due  Topic Date Due   Zoster Vaccines- Shingrix (1 of 2) Never done   DEXA SCAN  02/13/2010   DTaP/Tdap/Td (3 - Tdap) 03/12/2020   COVID-19 Vaccine (4 - 2024-25 season) 08/30/2023   Health Maintenance Items Addressed: None needed at this time  Additional Screening:  Vision Screening:  Recommended annual ophthalmology exams for early detection of glaucoma and other disorders of the eye.  Dental Screening: Recommended annual dental exams for proper oral hygiene  Community Resource Referral / Chronic Care Management: CRR required this visit?  No   CCM required this visit?  Appt scheduled with PCP    Plan:     I have personally reviewed and noted the following in the patient's chart:   Medical and social history Use of alcohol, tobacco or illicit drugs  Current medications and supplements including opioid prescriptions. Patient is not currently taking opioid prescriptions. Functional ability and status Nutritional status Physical activity Advanced directives List of other physicians Hospitalizations, surgeries, and ER visits in previous 12 months Vitals Screenings to include cognitive, depression, and falls Referrals and appointments  In addition, I have reviewed and discussed with patient certain preventive protocols, quality metrics, and best practice recommendations. A written personalized care plan for preventive services as well as general preventive health recommendations were provided to patient.     Sue Lush, LPN   04/22/9562   After Visit Summary: (Declined) Due  to this being a telephonic visit, with patients personalized plan was offered to patient but patient Declined AVS at this time   Notes: Nothing significant to report at this time.

## 2024-04-09 ENCOUNTER — Other Ambulatory Visit: Payer: Self-pay | Admitting: Family Medicine

## 2024-04-14 ENCOUNTER — Ambulatory Visit (INDEPENDENT_AMBULATORY_CARE_PROVIDER_SITE_OTHER)

## 2024-04-14 ENCOUNTER — Ambulatory Visit: Attending: Physician Assistant

## 2024-04-14 DIAGNOSIS — R55 Syncope and collapse: Secondary | ICD-10-CM | POA: Diagnosis not present

## 2024-04-14 LAB — ECHOCARDIOGRAM COMPLETE
AR max vel: 1.91 cm2
AV Area VTI: 1.89 cm2
AV Area mean vel: 1.88 cm2
AV Mean grad: 6 mmHg
AV Peak grad: 10.9 mmHg
Ao pk vel: 1.65 m/s
Area-P 1/2: 4.06 cm2
S' Lateral: 3.6 cm
Single Plane A4C EF: 57.2 %

## 2024-04-22 ENCOUNTER — Ambulatory Visit: Payer: Medicare Other | Admitting: Physician Assistant

## 2024-04-22 NOTE — Progress Notes (Deleted)
 Cardiology Office Note    Date:  04/22/2024   ID:  Hannah Pitts 02/18/1946, MRN [id number]  PCP:  Hannah Galea, MD  Cardiologist:  Hannah Pitts  Electrophysiologist:  None   Chief Complaint: Follow-up  History of Present Illness:   Hannah Pitts is a 78 y.o. female with history of Sjogren's and lupus, bilateral DVTs and PE in 2018 complicated by left rectus abdominis hematoma while on rivaroxaban  status post IVC filter, pericarditis, syncope, symptomatic PACs, aortic atherosclerosis, lymphoma, hypothyroidism, hepatic steatosis, Mnire's disease, GERD, and chronic back pain who presents for follow-up of ***.   She was previously followed by Drs. Hannah Pitts and Hannah Pitts.  She was most recently evaluated by Dr. Theodis Pitts in 2016 for abnormal EKG with concern for atrial flutter, with changes felt to be related to her resting tremor and not atrial flutter.  She also reported shortness of breath with echo in 10/2015 showed an EF of 60 to 65%, mild LVH, no regional wall motion normalities, grade 1 diastolic dysfunction, mild aortic insufficiency, trivial mitral regurgitation, and normal RV systolic function and ventricular cavity size.  Lexiscan  MPI in 10/2015 showed no significant ischemia or scar and was overall low risk with an EF of 55 to 65%.  Echo in 11/2017, during admission for showed an EF of 60 to 65%, no regional wall motion abnormalities, mild LVH, grade 1 diastolic dysfunction, trivial mitral regurgitation, normal RV systolic function.   She was seen at her PCP's office in 11/2023 for for follow-up of back pain.  Note indicates "by the way, I have been blacking out."  She reported she could feel a "trembling" in her heart.  Duration was unclear with most recent event felt to be months prior.  She was without symptoms of angina or cardiac decompensation.  This was the first time her PCP had been made aware of these episodes.  Labs were notable for mild renal dysfunction.  Zio patch in 11/2023  showed a predominant rhythm of sinus with an average rate of 62 bpm (range 45 to 158 bpm), 1 run of NSVT lasting 4 beats, 13 episodes of SVT lasting up to 18 beats, and rare supraventricular and ventricular ectopy.  No sustained arrhythmias, prolonged pauses, or evidence of high-grade AV block.  She was evaluated by cardiology on 02/22/2024 and reported an episode of syncope that occurred 3 to 4 months prior with associated prodrome of near syncope.  She was able to get to a chair and subsequently came to on the floor.  Episode occurred in the setting of increased low back pain.  No associated chest pain, palpitations, dyspnea, flushing, or loss of bowel/bladder function.  In total, her family member reported the patient may have had upwards of 4-5 syncopal episodes over the past 10 years.  Orthostatic vital signs were positive in the office.  Carotid artery ultrasound in 03/2024 showed near normal bilateral ICAs with antegrade flow of the bilateral vertebral arteries and disturbed flow in the right subclavian artery.  Echo in 03/2024 showed an EF of 55 to 60%, basal inferior and septal wall hypokinesis, grade 1 diastolic dysfunction, normal RV systolic function and ventricular cavity size, mild aortic insufficiency, and an estimated right atrial pressure of 3 mmHg.  ***   Labs independently reviewed: 11/2023 - potassium 5.0, BUN 29, serum creatinine 1.27, albumin 4.1, AST/ALT normal, TSH normal, Hgb 14.3, PLT 167 03/2023 - TC 173, TG 123, HDL 44, LDL 104  Past Medical History:  Diagnosis Date  Abnormal involuntary movements(781.0)    Anemia of other chronic disease    Depressive disorder, not elsewhere classified    Diaphragmatic hernia without mention of obstruction or gangrene    Dysthymic disorder    Generalized hyperhidrosis    GERD (gastroesophageal reflux disease)    H/O hiatal hernia    History of pulmonary embolus (PE)    Lymphoma (HCC) 03/2010   "they got it all w/surgery"   Meniere's  disease of left ear    Migraine    "used to have classic kind; not anymore; last one was in 2012"   Other rheumatoid arthritis with visceral or systemic involvement    Palpitations    Personal history of other disorders of nervous system and sense organs    Systemic lupus erythematosus (HCC)    Unspecified disease of pericardium    Pericarditis    Unspecified hypothyroidism    Urticaria, unspecified     Past Surgical History:  Procedure Laterality Date   ABDOMINAL HYSTERECTOMY  1978   APPENDECTOMY  1974   BREAST BIOPSY  03/2010   left breast;    BREAST LUMPECTOMY  03/2010   left breast; nonHodgkins lymphoma; "not breast cancer"   CHOLECYSTECTOMY  1974   ESOPHAGEAL DILATION  ?02/2011   ESOPHAGOGASTRODUODENOSCOPY  01-24-11   Slight stenosis esoph spasm   IR IVC FILTER PLMT / S&I /IMG GUID/MOD SED  12/17/2017   Left VATS  12/1997   LUMBAR DISC SURGERY  1977   shunt     put in behind left ear ; Menier's syndrome    Current Medications: No outpatient medications have been marked as taking for the 04/22/24 encounter (Appointment) with Hannah Chick, PA-C.    Allergies:   Heparin    Social History   Socioeconomic History   Marital status: Widowed    Spouse name: Not on file   Number of children: 2   Years of education: Not on file   Highest education level: Not on file  Occupational History   Occupation: Disabled     Employer: RETIRED  Tobacco Use   Smoking status: Never   Smokeless tobacco: Never  Vaping Use   Vaping status: Never Used  Substance and Sexual Activity   Alcohol  use: No   Drug use: No   Sexual activity: Never  Other Topics Concern   Not on file  Social History Narrative   Hannah Pitts lives in [place], [place].    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    Social Drivers of Manufacturing engineer Strain: Low Risk  (04/07/2024)   Overall Financial Resource Strain (CARDIA)    Difficulty of Paying Living Expenses: Not hard at all  Food Insecurity: No Food Insecurity (04/07/2024)   Hunger Vital Sign    Worried About Running Out of Food in the Last Year: Never true    Ran Out of Food in the Last Year: Never true  Transportation Needs: No Transportation Needs (04/07/2024)   PRAPARE - Administrator, Civil Service (Medical): No    Lack of Transportation (Non-Medical): No  Physical Activity: Insufficiently Active (04/07/2024)   Exercise Vital Sign    Days of Exercise per Week: 3 days    Minutes of Exercise per Session: 30 min  Stress: No Stress Concern Present (  04/07/2024)   [employer] [employer] - Occupational Stress Questionnaire    Feeling of Stress : Not at all  Social Connections: Moderately Isolated (04/07/2024)   Social Connection and Isolation Panel [NHANES]    Frequency of Communication with Friends and Family: Twice a week    Frequency of Social Gatherings with Friends and Family: Once a week    Attends Religious Services: 1 to 4 times per year    Active Member of [employer] or Organizations: No    Attends Banker Meetings: Never    Marital Status: Widowed     Family History:  The patient's family history includes Breast cancer in her maternal aunt; COPD in her sister; Cancer in her sister, sister, and sister; Coronary artery disease in her brother, sister, sister, and sister; Diabetes in her brother; Heart failure in her brother, mother, and sister; Stroke in her father. There is no history of Colon cancer.  ROS:   12-point review of systems is negative unless otherwise noted in the HPI.   EKGs/Labs/Other Studies Reviewed:    Studies reviewed were summarized above. The additional studies were reviewed today:   2D echo 04/14/2024: 1. Left ventricular ejection fraction, by estimation, is 55 to 60%. Left   ventricular ejection fraction by 3D volume is 70 %. The left ventricle has  normal function. The left ventricle demonstrates regional wall motion  abnormalities (basal inferior and  septal wall hypokinesis). Left ventricular diastolic parameters are  consistent with Grade I diastolic dysfunction (impaired relaxation).   2. Right ventricular systolic function is normal. The right ventricular  size is normal.   3. The mitral valve is normal in structure. No evidence of mitral valve  regurgitation. No evidence of mitral stenosis.   4. The aortic valve is normal in structure. Aortic valve regurgitation is  mild. No aortic stenosis is present.   5. The inferior vena cava is normal in size with greater than 50%  respiratory variability, suggesting right atrial pressure of 3 mmHg.  __________  Carotid artery ultrasound 04/14/2024: Right Carotid: The extracranial vessels were near-normal with only minimal  wall thickening or plaque.   Left Carotid: The extracranial vessels were near-normal with only minimal  wall thickening or plaque.   Vertebrals:  Bilateral vertebral arteries demonstrate antegrade flow.  Subclavians: Right subclavian artery flow was disturbed. Normal flow hemodynamics were seen in the left subclavian artery.  __________  Zio patch 11/2023: HR 45 - 158, average 62 bpm. 1 nonsustained VT lasting 4 beats. 13 nonsustained SVT, longest 18 beats. Rare supraventricular and ventricular ectopy. No atrial fibrillation. No sustained arrhythmias. __________   2D echo 12/13/2017: - Left ventricle: The cavity size was normal. Wall thickness was    increased in a pattern of mild LVH. Systolic function was normal.    The estimated ejection fraction was in the range of 60% to 65%.    Wall motion was normal; there were no regional wall motion    abnormalities. Doppler parameters are consistent with abnormal    left ventricular relaxation (grade 1 diastolic dysfunction).  - Aortic  valve: Mildly calcified annulus. Trileaflet.  - Mitral valve: There was trivial regurgitation.  - Right ventricle: Systolic function was normal.  - Tricuspid valve: There was trivial regurgitation.  - Pulmonary arteries: Systolic pressure could not be accurately    estimated.  - Pericardium, extracardiac: A prominent pericardial fat pad was    present.   Impressions:   - Images are limited. Mild LVH  with LVEF 60-65% and grade 1    diastolic dysfunction. Mildly calcified aortic annulus. Trivial    mitral and tricuspid regurgitation. Grossly normal right    ventricular contraction. Prominent pericardial fat-pad.  __________   2D echo 11/27/2015: - Left ventricle: The cavity size was normal. There was mild    concentric hypertrophy. Systolic function was normal. The    estimated ejection fraction was in the range of 60% to 65%. Wall    motion was normal; there were no regional wall motion    abnormalities. Doppler parameters are consistent with abnormal    left ventricular relaxation (grade 1 diastolic dysfunction).  - Aortic valve: There was mild regurgitation.  - Mitral valve: There was trivial regurgitation.  - Right ventricle: The cavity size was normal. Wall thickness was    normal. Systolic function was normal.  __________   Lexiscan  MPI 11/20/2015: Nuclear stress EF: 59%. The left ventricular ejection fraction is normal (55-65%). There was no ST segment deviation noted during stress. The study is normal. This is a low risk study. Normal myocardial perfusion without scar or ischemia.   Normal lexiscan  nuclear stress test demonstrating normal myocardial perfusion and function: EF 59%. __________   2D echo 08/04/2013: - Left ventricle: The cavity size was normal. Wall thickness    was increased in a pattern of mild LVH. Systolic function    was normal. The estimated ejection fraction was in the    range of 55% to 65%. Wall motion was normal; there were no    regional wall  motion abnormalities. Doppler parameters are    consistent with abnormal left ventricular relaxation    (grade 1 diastolic dysfunction).  - Aortic valve: Mild regurgitation.  - Mitral valve: Mild regurgitation.  - Atrial septum: No defect or patent foramen ovale was    identified.  ___________   2D echo 04/21/2012: - Left ventricle: The cavity size was normal. Wall thickness    was normal. Systolic function was normal. The estimated    ejection fraction was in the range of 60% to 65%. Wall    motion was normal; there were no regional wall motion    abnormalities. Doppler parameters are consistent with    abnormal left ventricular relaxation (grade 1 diastolic    dysfunction).  - Aortic valve: Mild regurgitation.    EKG:  EKG is ordered today.  The EKG ordered today demonstrates ***  Recent Labs: 12/14/2023: Hemoglobin 14.3; Platelets 167.0; TSH 0.52 12/24/2023: ALT 24; BUN 29; Creatinine, Ser 1.27; Potassium 5.0; Sodium 141  Recent Lipid Panel    Component Value Date/Time   CHOL 173 03/31/2023 0828   TRIG 123.0 03/31/2023 0828   HDL 44.60 03/31/2023 0828   CHOLHDL 4 03/31/2023 0828   VLDL 24.6 03/31/2023 0828   LDLCALC 104 (H) 03/31/2023 0828    PHYSICAL EXAM:    VS:  There were no vitals taken for this visit.  BMI: There is no height or weight on file to calculate BMI.  Physical Exam  Wt Readings from Last 3 Encounters:  04/07/24 212 lb (96.2 kg)  02/22/24 212 lb 4 oz (96.3 kg)  12/14/23 208 lb 6.4 oz (94.5 kg)     ASSESSMENT & PLAN:   Syncope:  Aortic atherosclerosis/HLD: LDL 104 in 05/2023 Target LDL less than 70 with noted aortic atherosclerosis on CT imaging.  History of bilateral DVTs and PE: Status post IVC filter in 2018.   {Are you ordering a CV Procedure (e.g. stress test, cath,  DCCV, TEE, etc)?   Press F2        :[id number]}     Disposition: F/u with Dr. Aaron Aas or an APP in ***.   Medication Adjustments/Labs and Tests Ordered: Current medicines  are reviewed at length with the patient today.  Concerns regarding medicines are outlined above. Medication changes, Labs and Tests ordered today are summarized above and listed in the Patient Instructions accessible in Encounters.   Signed, Varney Gentleman, PA-C 04/22/2024 7:05 AM     [facility] [place] [address] [place], [place] [address] [phone]

## 2024-05-09 NOTE — Progress Notes (Unsigned)
 Cardiology Office Note    Date:  05/10/2024   ID:  Hannah Pitts, DOB 1946-10-28, MRN [id number]  PCP:  Donnie Galea, MD  Cardiologist:  New  Electrophysiologist:  None   Chief Complaint: Follow-up  History of Present Illness:   Hannah Pitts is a 78 y.o. female with history of Sjogren's and lupus, bilateral DVTs and PE in 2018 complicated by left rectus abdominis hematoma while on rivaroxaban  status post IVC filter, pericarditis, orthostatic syncope, symptomatic PACs, aortic atherosclerosis, lymphoma, hypothyroidism, hepatic steatosis, Mnire's disease, GERD, and chronic back pain who presents for follow-up of orthostatic syncope.   She was previously followed by Drs. Ardell Koller and Allred.  She was evaluated by Dr. Theodis Fiscal in 2016 for abnormal EKG with concern for atrial flutter, with changes felt to be related to her resting tremor and not atrial flutter.  She also reported shortness of breath with echo in 10/2015 showed an EF of 60 to 65%, mild LVH, no regional wall motion normalities, grade 1 diastolic dysfunction, mild aortic insufficiency, trivial mitral regurgitation, and normal RV systolic function and ventricular cavity size.  Lexiscan  MPI in 10/2015 showed no significant ischemia or scar and was overall low risk with an EF of 55 to 65%.  Echo in 11/2017, during admission for showed an EF of 60 to 65%, no regional wall motion abnormalities, mild LVH, grade 1 diastolic dysfunction, trivial mitral regurgitation, normal RV systolic function.   She was seen at her PCP's office in 11/2023 for for follow-up of back pain.  Note indicates "by the way, I have been blacking out."  She reported she could feel a "trembling" in her heart.  Duration was unclear with most recent event felt to be months prior.  She was without symptoms of angina or cardiac decompensation.  This was the first time her PCP had been made aware of these episodes.  Labs were notable for mild renal dysfunction.  Zio  patch in 11/2023 showed a predominant rhythm of sinus with an average rate of 62 bpm (range 45 to 158 bpm), 1 run of NSVT lasting 4 beats, 13 episodes of SVT lasting up to 18 beats, and rare supraventricular and ventricular ectopy.  No sustained arrhythmias, prolonged pauses, or evidence of high-grade AV block.  She was evaluated by cardiology on 02/22/2024 and reported an episode of syncope that occurred 3 to 4 months prior with associated prodrome of near syncope.  She was able to get to a chair and subsequently came to on the floor.  Episode occurred in the setting of increased low back pain.  No associated chest pain, palpitations, dyspnea, flushing, or loss of bowel/bladder function.  In total, her family member reported the patient may have had upwards of 4-5 syncopal episodes over the past 10 years.  Orthostatic vital signs were positive in the office.  Carotid artery ultrasound in 03/2024 showed near normal bilateral ICAs with antegrade flow of the bilateral vertebral arteries and disturbed flow in the right subclavian artery.  Echo in 03/2024 showed an EF of 55 to 60%, basal inferior and septal wall hypokinesis, grade 1 diastolic dysfunction, normal RV systolic function and ventricular cavity size, mild aortic insufficiency, and an estimated right atrial pressure of 3 mmHg.  She comes in today accompanied by a family member.  She has had a couple further episodes of near syncope without frank syncope.  These occurred with positional changes when rising from a seated position.  They are with a gradual prodrome of  dizziness.  No associated flushing, nausea, vomiting, or seizure-like activity.  She has been without symptoms of chest pain or cardiac decompensation.  No palpitations, lower extremity swelling, or progressive orthopnea.  She does not drink much water throughout the day.  Snacks on sugary foods.  Spends the majority of her day laying down and is not active throughout the day with associated lower  extremity weakness and imbalance.   Labs independently reviewed: 11/2023 - potassium 5.0, BUN 29, serum creatinine 1.27, albumin 4.1, AST/ALT normal, TSH normal, Hgb 14.3, PLT 167 03/2023 - TC 173, TG 123, HDL 44, LDL 104  Past Medical History:  Diagnosis Date   Abnormal involuntary movements(781.0)    Anemia of other chronic disease    Depressive disorder, not elsewhere classified    Diaphragmatic hernia without mention of obstruction or gangrene    Dysthymic disorder    Generalized hyperhidrosis    GERD (gastroesophageal reflux disease)    H/O hiatal hernia    History of pulmonary embolus (PE)    Lymphoma (HCC) 03/2010   "they got it all w/surgery"   Meniere's disease of left ear    Migraine    "used to have classic kind; not anymore; last one was in 2012"   Other rheumatoid arthritis with visceral or systemic involvement    Palpitations    Personal history of other disorders of nervous system and sense organs    Systemic lupus erythematosus (HCC)    Unspecified disease of pericardium    Pericarditis    Unspecified hypothyroidism    Urticaria, unspecified     Past Surgical History:  Procedure Laterality Date   ABDOMINAL HYSTERECTOMY  1978   APPENDECTOMY  1974   BREAST BIOPSY  03/2010   left breast;    BREAST LUMPECTOMY  03/2010   left breast; nonHodgkins lymphoma; "not breast cancer"   CHOLECYSTECTOMY  1974   ESOPHAGEAL DILATION  ?02/2011   ESOPHAGOGASTRODUODENOSCOPY  01-24-11   Slight stenosis esoph spasm   IR IVC FILTER PLMT / S&I /IMG GUID/MOD SED  12/17/2017   Left VATS  12/1997   LUMBAR DISC SURGERY  1977   shunt     put in behind left ear ; Menier's syndrome    Current Medications: Current Meds  Medication Sig   acetaminophen  (TYLENOL ) 500 MG tablet Take 500 mg by mouth every 6 (six) hours as needed.   Cholecalciferol (VITAMIN D3) 25 MCG (1000 UT) CAPS Take 1,000 Units by mouth daily with breakfast.   escitalopram  (LEXAPRO ) 20 MG tablet Take 1 tablet (20 mg  total) by mouth daily.   levothyroxine  (SYNTHROID ) 112 MCG tablet TAKE 1 TABLET (112 MCG TOTAL) BY MOUTH DAILY BEFORE BREAKFAST. EXCEPT SKIP SUNDAY. 6 TABS A WEEK.   nystatin  (MYCOSTATIN /NYSTOP ) powder Apply 1 Application topically 2 (two) times daily.   Polyethyl Glyc-Propyl Glyc PF (SYSTANE PRESERVATIVE FREE) 0.4-0.3 % SOLN Place 1-2 drops into both eyes 4 (four) times daily. Place 1-2 drops into both eyes four times a day due to Sjgren's syndrome   pramipexole  (MIRAPEX ) 0.125 MG tablet TAKE 1 TABLET BY MOUTH AT BEDTIME. TO REPLACE REQUIP     Allergies:   Heparin    Social History   Socioeconomic History   Marital status: Widowed    Spouse name: Not on file   Number of children: 2   Years of education: Not on file   Highest education level: Not on file  Occupational History   Occupation: Disabled     Employer: RETIRED  Tobacco Use  Smoking status: Never   Smokeless tobacco: Never  Vaping Use   Vaping status: Never Used  Substance and Sexual Activity   Alcohol  use: No   Drug use: No   Sexual activity: Never  Other Topics Concern   Not on file  Social History Narrative   Ms. Konicki lives in [place], [place].    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    Social Drivers of Corporate investment banker Strain: Low Risk  (04/07/2024)   Overall Financial Resource Strain (CARDIA)    Difficulty of Paying Living Expenses: Not hard at all  Food Insecurity: No Food Insecurity (04/07/2024)   Hunger Vital Sign    Worried About Running Out of Food in the Last Year: Never true    Ran Out of Food in the Last Year: Never true  Transportation Needs: No Transportation Needs (04/07/2024)   PRAPARE - Administrator, Civil Service (Medical): No    Lack of Transportation (Non-Medical): No  Physical Activity:  Insufficiently Active (04/07/2024)   Exercise Vital Sign    Days of Exercise per Week: 3 days    Minutes of Exercise per Session: 30 min  Stress: No Stress Concern Present (04/07/2024)   [employer] of Occupational Health - Occupational Stress Questionnaire    Feeling of Stress : Not at all  Social Connections: Moderately Isolated (04/07/2024)   Social Connection and Isolation Panel [NHANES]    Frequency of Communication with Friends and Family: Twice a week    Frequency of Social Gatherings with Friends and Family: Once a week    Attends Religious Services: 1 to 4 times per year    Active Member of [employer] or Organizations: No    Attends Banker Meetings: Never    Marital Status: Widowed     Family History:  The patient's family history includes Breast cancer in her maternal aunt; COPD in her sister; Cancer in her sister, sister, and sister; Coronary artery disease in her brother, sister, sister, and sister; Diabetes in her brother; Heart failure in her brother, mother, and sister; Stroke in her father. There is no history of Colon cancer.  ROS:   12-point review of systems is negative unless otherwise noted in the HPI.   EKGs/Labs/Other Studies Reviewed:    Studies reviewed were summarized above. The additional studies were reviewed today:   2D echo 04/14/2024: 1. Left ventricular ejection fraction, by estimation, is 55 to 60%. Left  ventricular ejection fraction by 3D volume is 70 %. The left ventricle has  normal function. The left ventricle demonstrates regional wall motion  abnormalities (basal inferior and  septal wall hypokinesis). Left ventricular diastolic parameters are  consistent with Grade I diastolic dysfunction (impaired relaxation).   2. Right ventricular systolic function is normal. The right ventricular  size is normal.   3. The mitral valve is normal in structure. No evidence of mitral valve  regurgitation. No evidence of mitral stenosis.   4.  The aortic valve is normal in structure. Aortic valve regurgitation is  mild. No aortic stenosis is present.   5. The inferior vena cava is normal in size with greater than 50%  respiratory variability, suggesting right atrial pressure of 3 mmHg.  __________  Carotid artery ultrasound 04/14/2024: Right Carotid: The extracranial vessels were  near-normal with only minimal  wall thickening or plaque.   Left Carotid: The extracranial vessels were near-normal with only minimal  wall thickening or plaque.   Vertebrals:  Bilateral vertebral arteries demonstrate antegrade flow.  Subclavians: Right subclavian artery flow was disturbed. Normal flow hemodynamics were seen in the left subclavian artery.  __________  Zio patch 11/2023: HR 45 - 158, average 62 bpm. 1 nonsustained VT lasting 4 beats. 13 nonsustained SVT, longest 18 beats. Rare supraventricular and ventricular ectopy. No atrial fibrillation. No sustained arrhythmias. __________   2D echo 12/13/2017: - Left ventricle: The cavity size was normal. Wall thickness was    increased in a pattern of mild LVH. Systolic function was normal.    The estimated ejection fraction was in the range of 60% to 65%.    Wall motion was normal; there were no regional wall motion    abnormalities. Doppler parameters are consistent with abnormal    left ventricular relaxation (grade 1 diastolic dysfunction).  - Aortic valve: Mildly calcified annulus. Trileaflet.  - Mitral valve: There was trivial regurgitation.  - Right ventricle: Systolic function was normal.  - Tricuspid valve: There was trivial regurgitation.  - Pulmonary arteries: Systolic pressure could not be accurately    estimated.  - Pericardium, extracardiac: A prominent pericardial fat pad was    present.   Impressions:   - Images are limited. Mild LVH with LVEF 60-65% and grade 1    diastolic dysfunction. Mildly calcified aortic annulus. Trivial    mitral and tricuspid  regurgitation. Grossly normal right    ventricular contraction. Prominent pericardial fat-pad.  __________   2D echo 11/27/2015: - Left ventricle: The cavity size was normal. There was mild    concentric hypertrophy. Systolic function was normal. The    estimated ejection fraction was in the range of 60% to 65%. Wall    motion was normal; there were no regional wall motion    abnormalities. Doppler parameters are consistent with abnormal    left ventricular relaxation (grade 1 diastolic dysfunction).  - Aortic valve: There was mild regurgitation.  - Mitral valve: There was trivial regurgitation.  - Right ventricle: The cavity size was normal. Wall thickness was    normal. Systolic function was normal.  __________   Lexiscan  MPI 11/20/2015: Nuclear stress EF: 59%. The left ventricular ejection fraction is normal (55-65%). There was no ST segment deviation noted during stress. The study is normal. This is a low risk study. Normal myocardial perfusion without scar or ischemia.   Normal lexiscan  nuclear stress test demonstrating normal myocardial perfusion and function: EF 59%. __________   2D echo 08/04/2013: - Left ventricle: The cavity size was normal. Wall thickness    was increased in a pattern of mild LVH. Systolic function    was normal. The estimated ejection fraction was in the    range of 55% to 65%. Wall motion was normal; there were no    regional wall motion abnormalities. Doppler parameters are    consistent with abnormal left ventricular relaxation    (grade 1 diastolic dysfunction).  - Aortic valve: Mild regurgitation.  - Mitral valve: Mild regurgitation.  - Atrial septum: No defect or patent foramen ovale was    identified.  ___________   2D echo 04/21/2012: - Left ventricle: The cavity size was normal. Wall thickness    was normal. Systolic function was normal. The estimated    ejection fraction was in the range of 60% to 65%. Wall  motion was normal; there  were no regional wall motion    abnormalities. Doppler parameters are consistent with    abnormal left ventricular relaxation (grade 1 diastolic    dysfunction).  - Aortic valve: Mild regurgitation.    EKG:  EKG is ordered today.  The EKG ordered today demonstrates NSR, 81 bpm, left axis deviation, LVH, poor R wave progression along the precordial leads, nonsignificant ST-T changes, consistent with prior tracing   Recent Labs: 12/14/2023: Hemoglobin 14.3; Platelets 167.0; TSH 0.52 12/24/2023: ALT 24; BUN 29; Creatinine, Ser 1.27; Potassium 5.0; Sodium 141  Recent Lipid Panel    Component Value Date/Time   CHOL 173 03/31/2023 0828   TRIG 123.0 03/31/2023 0828   HDL 44.60 03/31/2023 0828   CHOLHDL 4 03/31/2023 0828   VLDL 24.6 03/31/2023 0828   LDLCALC 104 (H) 03/31/2023 0828    PHYSICAL EXAM:    VS:  BP 110/70 (BP Location: Left Arm, Patient Position: Sitting, Cuff Size: Large)   Pulse 81   Ht 5\' 5"  (1.651 m)   Wt 204 lb (92.5 kg)   SpO2 94%   BMI 33.95 kg/m   BMI: Body mass index is 33.95 kg/m.  Physical Exam Vitals reviewed.  Constitutional:      Appearance: She is well-developed.  HENT:     Head: Normocephalic and atraumatic.  Eyes:     General:        Right eye: No discharge.        Left eye: No discharge.  Cardiovascular:     Rate and Rhythm: Normal rate and regular rhythm.     Heart sounds: Normal heart sounds, S1 normal and S2 normal. Heart sounds not distant. No midsystolic click and no opening snap. No murmur heard.    No friction rub.  Pulmonary:     Effort: Pulmonary effort is normal. No respiratory distress.     Breath sounds: Normal breath sounds. No decreased breath sounds, wheezing, rhonchi or rales.  Chest:     Chest wall: No tenderness.  Musculoskeletal:     Cervical back: Normal range of motion.     Right lower leg: No edema.     Left lower leg: No edema.  Skin:    General: Skin is warm and dry.     Nails: There is no clubbing.   Neurological:     Mental Status: She is alert and oriented to person, place, and time.  Psychiatric:        Speech: Speech normal.        Behavior: Behavior normal.        Thought Content: Thought content normal.        Judgment: Judgment normal.     Wt Readings from Last 3 Encounters:  05/10/24 204 lb (92.5 kg)  04/07/24 212 lb (96.2 kg)  02/22/24 212 lb 4 oz (96.3 kg)     ASSESSMENT & PLAN:   Syncope/near syncope: Consistent with orthostatic syncope.  No frank syncope since last visit.  Continues to drink approximately 30 ounces of liquid daily.  Suspect her functional status decline and sedentary lifestyle is also contributing.  Recommend compression socks and increase in hydration.  Defer addition of midodrine at this time given she spends the majority of her day laying down in bed.  Aortic atherosclerosis/HLD: LDL 104 in 05/2023 Target LDL less than 70 with noted aortic atherosclerosis on CT imaging.  Not currently on statin.  History of bilateral DVTs and PE: Status post IVC filter in 2018.  Lower extremity weakness/imbalance: Likely contributing to her episodes of near syncope and falls.  May benefit from home health PT.  Will defer to PCP.     Disposition: F/u with Dr. Gollan or an APP in 3 months.   Medication Adjustments/Labs and Tests Ordered: Current medicines are reviewed at length with the patient today.  Concerns regarding medicines are outlined above. Medication changes, Labs and Tests ordered today are summarized above and listed in the Patient Instructions accessible in Encounters.   Signed, Varney Gentleman, PA-C 05/10/2024 2:54 PM     [facility] -  [address] [place], [place] [address] [phone]

## 2024-05-10 ENCOUNTER — Ambulatory Visit: Attending: Physician Assistant | Admitting: Physician Assistant

## 2024-05-10 ENCOUNTER — Encounter: Payer: Self-pay | Admitting: Physician Assistant

## 2024-05-10 VITALS — BP 110/70 | HR 81 | Ht 65.0 in | Wt 204.0 lb

## 2024-05-10 DIAGNOSIS — Z86718 Personal history of other venous thrombosis and embolism: Secondary | ICD-10-CM | POA: Diagnosis not present

## 2024-05-10 DIAGNOSIS — R2689 Other abnormalities of gait and mobility: Secondary | ICD-10-CM

## 2024-05-10 DIAGNOSIS — Z86711 Personal history of pulmonary embolism: Secondary | ICD-10-CM | POA: Diagnosis not present

## 2024-05-10 DIAGNOSIS — I951 Orthostatic hypotension: Secondary | ICD-10-CM | POA: Diagnosis not present

## 2024-05-10 DIAGNOSIS — E785 Hyperlipidemia, unspecified: Secondary | ICD-10-CM

## 2024-05-10 DIAGNOSIS — R29898 Other symptoms and signs involving the musculoskeletal system: Secondary | ICD-10-CM

## 2024-05-10 DIAGNOSIS — I700 Atherosclerosis of aorta: Secondary | ICD-10-CM | POA: Diagnosis not present

## 2024-05-10 DIAGNOSIS — R55 Syncope and collapse: Secondary | ICD-10-CM

## 2024-05-10 NOTE — Patient Instructions (Signed)
 Medication Instructions:  Your Physician recommend you continue on your current medication as directed.    *If you need a refill on your cardiac medications before your next appointment, please call your pharmacy* Follow-Up: At [facility] you and your health needs are our priority.  As part of our continuing mission to provide you with exceptional heart care, our providers are all part of one team.  This team includes your primary Cardiologist (physician) and Advanced Practice Providers or APPs (Physician Assistants and Nurse Practitioners) who all work together to provide you with the care you need, when you need it.  Your next appointment:   3 month(s)  Provider:   Varney Gentleman, PA-C    We recommend signing up for the patient portal called "MyChart".  Sign up information is provided on this After Visit Summary.  MyChart is used to connect with patients for Virtual Visits (Telemedicine).  Patients are able to view lab/test results, encounter notes, upcoming appointments, etc.  Non-urgent messages can be sent to your provider as well.   To learn more about what you can do with MyChart, go to [email or web address].   Other Instructions Please increase your water intake as discussed with Varney Gentleman, PA-C.  Ryan also recommends wearing knee high compression socks that are 15 mmHg strength.

## 2024-05-12 ENCOUNTER — Telehealth: Payer: Self-pay

## 2024-05-12 NOTE — Telephone Encounter (Signed)
 Copied from CRM [id number]. Topic: Clinical - Medication Question >> May 12, 2024  3:13 PM Hannah Pitts wrote: Reason for CRM: Pt states that she has Lupus and it causes her to itch badly. States she has been taking benedryl and it is not helping her. Wants to know if PCP will call in Prednisone  for her to see if that would help. If so, she would like this called into  [id number] Jonette Nestle, [place] - [address] [place] [address] Phone: [phone] Fax: [phone] Hours: Not open 24 hours

## 2024-05-13 MED ORDER — PREDNISONE 10 MG PO TABS: ORAL_TABLET | ORAL | 0 refills | Status: DC

## 2024-05-13 NOTE — Addendum Note (Signed)
 Addended by: Donnie Galea on: 05/13/2024 02:05 PM   Modules accepted: Orders

## 2024-05-13 NOTE — Telephone Encounter (Signed)
 Left message with patient to advise that rx has been sent.

## 2024-05-13 NOTE — Telephone Encounter (Signed)
 She could try a low dose of prednisone  in the short run but f/u if not better.  Rx sent.  Thanks.

## 2024-05-20 ENCOUNTER — Ambulatory Visit (INDEPENDENT_AMBULATORY_CARE_PROVIDER_SITE_OTHER): Admitting: Family Medicine

## 2024-05-20 VITALS — BP 118/80 | HR 69 | Temp 98.7°F | Ht 65.0 in | Wt 208.0 lb

## 2024-05-20 DIAGNOSIS — E785 Hyperlipidemia, unspecified: Secondary | ICD-10-CM

## 2024-05-20 DIAGNOSIS — F339 Major depressive disorder, recurrent, unspecified: Secondary | ICD-10-CM

## 2024-05-20 DIAGNOSIS — M329 Systemic lupus erythematosus, unspecified: Secondary | ICD-10-CM | POA: Diagnosis not present

## 2024-05-20 DIAGNOSIS — E039 Hypothyroidism, unspecified: Secondary | ICD-10-CM

## 2024-05-20 DIAGNOSIS — G2581 Restless legs syndrome: Secondary | ICD-10-CM

## 2024-05-20 DIAGNOSIS — Z7189 Other specified counseling: Secondary | ICD-10-CM

## 2024-05-20 DIAGNOSIS — Z0000 Encounter for general adult medical examination without abnormal findings: Secondary | ICD-10-CM

## 2024-05-20 MED ORDER — ESCITALOPRAM OXALATE 20 MG PO TABS: 20.0000 mg | ORAL_TABLET | Freq: Every day | ORAL | 3 refills | Status: AC

## 2024-05-20 MED ORDER — PREDNISONE 10 MG PO TABS: ORAL_TABLET | ORAL | 0 refills | Status: DC

## 2024-05-20 NOTE — Patient Instructions (Addendum)
 Please call [phone] about an alert button.  Take care.  Glad to see you. Go to the lab on the way out.   If you have mychart we'll likely use that to update you.     Finish the prednisone  and see if you can get by without restarting.  If needed, restart.

## 2024-05-20 NOTE — Progress Notes (Unsigned)
 Prev itching stopped with prednisone  use.  She had small papular lesions that came up on the arms, legs, trunk, but not on the face. Spared the palms and soles.  She needs 2nd opinion from Dr. Bascom Lily, in [place].  She wanted to defer PT at home.  She has HEP to try.  She is using walker at baseline.   She is going to try support stockings, d/w pt.    Mood d/w pt.  Taking lexapro , helped.  Variable mood, some days are better than others, she thought lexapro  helped.    Hypothyroidism. No ADE on med. Compliant.  Recheck labs pending.   RLS.  She is using an exercise pedal.  Mirapex  helped.    She got up middle of the night earlier in the year and fell.  Had to call for help.  Fall button info given to patient.   Flu d/w pt.   Shingles discussed with patient PNA previously done Tetanus 2011 COVID-vaccine previously done Colon cancer screening deferred 2025 due to age.   Breast cancer screening declined by patient Bone density test deferred by patient request. Advance directive-both daughters equally designated if patient were incapacitated.

## 2024-05-21 LAB — VITAMIN D 25 HYDROXY (VIT D DEFICIENCY, FRACTURES): Vit D, 25-Hydroxy: 48 ng/mL (ref 30–100)

## 2024-05-21 LAB — COMPREHENSIVE METABOLIC PANEL WITH GFR
AG Ratio: 1.5 (calc) (ref 1.0–2.5)
ALT: 27 U/L (ref 6–29)
AST: 19 U/L (ref 10–35)
Albumin: 4.2 g/dL (ref 3.6–5.1)
Alkaline phosphatase (APISO): 72 U/L (ref 37–153)
BUN/Creatinine Ratio: 33 (calc) — ABNORMAL HIGH (ref 6–22)
BUN: 36 mg/dL — ABNORMAL HIGH (ref 7–25)
CO2: 26 mmol/L (ref 20–32)
Calcium: 10.2 mg/dL (ref 8.6–10.4)
Chloride: 102 mmol/L (ref 98–110)
Creat: 1.1 mg/dL — ABNORMAL HIGH (ref 0.60–1.00)
Globulin: 2.8 g/dL (ref 1.9–3.7)
Glucose, Bld: 87 mg/dL (ref 65–99)
Potassium: 5.1 mmol/L (ref 3.5–5.3)
Sodium: 140 mmol/L (ref 135–146)
Total Bilirubin: 0.5 mg/dL (ref 0.2–1.2)
Total Protein: 7 g/dL (ref 6.1–8.1)
eGFR: 51 mL/min/{1.73_m2} — ABNORMAL LOW (ref >=60)

## 2024-05-21 LAB — CBC WITH DIFFERENTIAL/PLATELET
Absolute Lymphocytes: 1786 {cells}/uL (ref 850–3900)
Absolute Monocytes: 707 {cells}/uL (ref 200–950)
Basophils Absolute: 19 {cells}/uL (ref 0–200)
Basophils Relative: 0.2 %
Eosinophils Absolute: 19 {cells}/uL (ref 15–500)
Eosinophils Relative: 0.2 %
HCT: 43.1 % (ref 35.0–45.0)
Hemoglobin: 14.1 g/dL (ref 11.7–15.5)
MCH: 29.4 pg (ref 27.0–33.0)
MCHC: 32.7 g/dL (ref 32.0–36.0)
MCV: 89.8 fL (ref 80.0–100.0)
MPV: 10.7 fL (ref 7.5–12.5)
Monocytes Relative: 7.6 %
Neutro Abs: 6770 {cells}/uL (ref 1500–7800)
Neutrophils Relative %: 72.8 %
Platelets: 193 10*3/uL (ref 140–400)
RBC: 4.8 10*6/uL (ref 3.80–5.10)
RDW: 13.1 % (ref 11.0–15.0)
Total Lymphocyte: 19.2 %
WBC: 9.3 10*3/uL (ref 3.8–10.8)

## 2024-05-21 LAB — LIPID PANEL
Cholesterol: 191 mg/dL (ref <200)
HDL: 47 mg/dL — ABNORMAL LOW (ref >=50)
LDL Cholesterol (Calc): 112 mg/dL — ABNORMAL HIGH
Non-HDL Cholesterol (Calc): 144 mg/dL — ABNORMAL HIGH (ref <130)
Total CHOL/HDL Ratio: 4.1 (calc) (ref <5.0)
Triglycerides: 200 mg/dL — ABNORMAL HIGH (ref <150)

## 2024-05-21 LAB — TSH: TSH: 0.33 m[IU]/L — ABNORMAL LOW (ref 0.40–4.50)

## 2024-05-23 ENCOUNTER — Ambulatory Visit: Payer: Self-pay | Admitting: Family Medicine

## 2024-05-23 NOTE — Assessment & Plan Note (Signed)
  Flu d/w pt.   Shingles discussed with patient PNA previously done Tetanus 2011 COVID-vaccine previously done Colon cancer screening deferred 2025 due to age.   Breast cancer screening declined by patient Bone density test deferred by patient request. Advance directive-both daughters equally designated if patient were incapacitated.

## 2024-05-23 NOTE — Assessment & Plan Note (Signed)
 Overall improved with Lexapro .  Would continue as is.

## 2024-05-23 NOTE — Assessment & Plan Note (Signed)
 No ADE on med. Compliant.  Recheck labs pending.  Continue levothyroxine .

## 2024-05-23 NOTE — Assessment & Plan Note (Signed)
Advance directive-both daughters equally designated if patient were incapacitated. ?

## 2024-05-23 NOTE — Assessment & Plan Note (Signed)
 Continue mirapex .  No ADE on med.  It helped.

## 2024-05-23 NOTE — Assessment & Plan Note (Signed)
 She wanted to get a second opinion.  Referral placed.  Her joint pain and rash clearly improved on prednisone .  Discussed that this is not a great long-term option.  I printed another prednisone  prescription for her to use in case she had an immediate flare of symptoms after she finishes the current taper.

## 2024-06-03 ENCOUNTER — Encounter (HOSPITAL_COMMUNITY)
Admission: EM | Disposition: A | Discharge status: Home / Self Care | Payer: Self-pay | Source: Home / Self Care | Attending: Emergency Medicine

## 2024-06-03 ENCOUNTER — Emergency Department (HOSPITAL_COMMUNITY)

## 2024-06-03 ENCOUNTER — Other Ambulatory Visit: Payer: Self-pay

## 2024-06-03 ENCOUNTER — Observation Stay (HOSPITAL_COMMUNITY)
Admission: EM | Admit: 2024-06-03 | Discharge: 2024-06-04 | Disposition: A | Discharge status: Home / Self Care | Attending: Cardiology | Admitting: Cardiology

## 2024-06-03 DIAGNOSIS — I2510 Atherosclerotic heart disease of native coronary artery without angina pectoris: Secondary | ICD-10-CM | POA: Diagnosis not present

## 2024-06-03 DIAGNOSIS — Z79899 Other long term (current) drug therapy: Secondary | ICD-10-CM | POA: Insufficient documentation

## 2024-06-03 DIAGNOSIS — Z86718 Personal history of other venous thrombosis and embolism: Secondary | ICD-10-CM | POA: Diagnosis not present

## 2024-06-03 DIAGNOSIS — E039 Hypothyroidism, unspecified: Secondary | ICD-10-CM | POA: Diagnosis not present

## 2024-06-03 DIAGNOSIS — F32A Depression, unspecified: Secondary | ICD-10-CM | POA: Insufficient documentation

## 2024-06-03 DIAGNOSIS — I214 Non-ST elevation (NSTEMI) myocardial infarction: Secondary | ICD-10-CM | POA: Diagnosis not present

## 2024-06-03 DIAGNOSIS — R918 Other nonspecific abnormal finding of lung field: Secondary | ICD-10-CM | POA: Diagnosis not present

## 2024-06-03 DIAGNOSIS — Z86711 Personal history of pulmonary embolism: Secondary | ICD-10-CM | POA: Diagnosis not present

## 2024-06-03 DIAGNOSIS — I700 Atherosclerosis of aorta: Secondary | ICD-10-CM | POA: Diagnosis not present

## 2024-06-03 DIAGNOSIS — R079 Chest pain, unspecified: Secondary | ICD-10-CM | POA: Diagnosis not present

## 2024-06-03 DIAGNOSIS — Z955 Presence of coronary angioplasty implant and graft: Secondary | ICD-10-CM

## 2024-06-03 DIAGNOSIS — I517 Cardiomegaly: Secondary | ICD-10-CM | POA: Diagnosis not present

## 2024-06-03 DIAGNOSIS — E785 Hyperlipidemia, unspecified: Secondary | ICD-10-CM | POA: Diagnosis not present

## 2024-06-03 DIAGNOSIS — S2241XA Multiple fractures of ribs, right side, initial encounter for closed fracture: Secondary | ICD-10-CM | POA: Diagnosis not present

## 2024-06-03 DIAGNOSIS — R0689 Other abnormalities of breathing: Secondary | ICD-10-CM | POA: Diagnosis not present

## 2024-06-03 HISTORY — PX: LEFT HEART CATH AND CORONARY ANGIOGRAPHY: CATH118249

## 2024-06-03 HISTORY — PX: CORONARY STENT INTERVENTION: CATH118234

## 2024-06-03 LAB — CBC
HCT: 39.9 % (ref 36.0–46.0)
Hemoglobin: 12.7 g/dL (ref 12.0–15.0)
MCH: 29.5 pg (ref 26.0–34.0)
MCHC: 31.8 g/dL (ref 30.0–36.0)
MCV: 92.6 fL (ref 80.0–100.0)
Platelets: 148 10*3/uL — ABNORMAL LOW (ref 150–400)
RBC: 4.31 MIL/uL (ref 3.87–5.11)
RDW: 12.4 % (ref 11.5–15.5)
WBC: 7 10*3/uL (ref 4.0–10.5)
nRBC: 0 % (ref 0.0–0.2)

## 2024-06-03 LAB — TROPONIN I (HIGH SENSITIVITY)
Troponin I (High Sensitivity): 315 ng/L (ref <18)
Troponin I (High Sensitivity): 20444 ng/L (ref <18)

## 2024-06-03 LAB — BASIC METABOLIC PANEL WITH GFR
Anion gap: 9 (ref 5–15)
BUN: 35 mg/dL — ABNORMAL HIGH (ref 8–23)
CO2: 27 mmol/L (ref 22–32)
Calcium: 9.2 mg/dL (ref 8.9–10.3)
Chloride: 102 mmol/L (ref 98–111)
Creatinine, Ser: 1.28 mg/dL — ABNORMAL HIGH (ref 0.44–1.00)
GFR, Estimated: 43 mL/min — ABNORMAL LOW (ref >60)
Glucose, Bld: 120 mg/dL — ABNORMAL HIGH (ref 70–99)
Potassium: 4.5 mmol/L (ref 3.5–5.1)
Sodium: 138 mmol/L (ref 135–145)

## 2024-06-03 LAB — POCT ACTIVATED CLOTTING TIME: Activated Clotting Time: 325 s

## 2024-06-03 MED ORDER — HEPARIN SODIUM (PORCINE) 1000 UNIT/ML IJ SOLN
INTRAMUSCULAR | Status: DC | PRN
  Administered 2024-06-03: 4000 [IU] via INTRAVENOUS
  Administered 2024-06-03: 3000 [IU] via INTRAVENOUS

## 2024-06-03 MED ORDER — SODIUM CHLORIDE 0.9 % WEIGHT BASED INFUSION
1.0000 mL/kg/h | INTRAVENOUS | Status: DC
  Administered 2024-06-03: 1 mL/kg/h via INTRAVENOUS

## 2024-06-03 MED ORDER — ONDANSETRON HCL 4 MG/2ML IJ SOLN: 4.0000 mg | Freq: Four times a day (QID) | INTRAMUSCULAR | Status: DC | PRN

## 2024-06-03 MED ORDER — LABETALOL HCL 5 MG/ML IV SOLN: 10.0000 mg | INTRAVENOUS | Status: AC | PRN

## 2024-06-03 MED ORDER — SODIUM CHLORIDE 0.9 % WEIGHT BASED INFUSION
3.0000 mL/kg/h | INTRAVENOUS | Status: DC
  Administered 2024-06-03: 3 mL/kg/h via INTRAVENOUS

## 2024-06-03 MED ORDER — HEPARIN (PORCINE) IN NACL 2-0.9 UNITS/ML
INTRAMUSCULAR | Status: DC | PRN
  Administered 2024-06-03: 10 mL via INTRA_ARTERIAL

## 2024-06-03 MED ORDER — ASPIRIN 81 MG PO CHEW
81.0000 mg | CHEWABLE_TABLET | Freq: Once | ORAL | Status: AC
  Administered 2024-06-03: 81 mg via ORAL
  Filled 2024-06-03: qty 1

## 2024-06-03 MED ORDER — HYDRALAZINE HCL 20 MG/ML IJ SOLN: 10.0000 mg | INTRAMUSCULAR | Status: AC | PRN

## 2024-06-03 MED ORDER — PRAMIPEXOLE DIHYDROCHLORIDE 0.125 MG PO TABS
0.1250 mg | ORAL_TABLET | Freq: Every day | ORAL | Status: DC
  Administered 2024-06-03: 0.125 mg via ORAL
  Filled 2024-06-03: qty 1

## 2024-06-03 MED ORDER — IOHEXOL 350 MG/ML SOLN
INTRAVENOUS | Status: DC | PRN
Start: 2024-06-03 — End: 2024-06-03
  Administered 2024-06-03: 115 mL

## 2024-06-03 MED ORDER — CLOPIDOGREL BISULFATE 300 MG PO TABS
ORAL_TABLET | ORAL | Status: AC
  Filled 2024-06-03: qty 2

## 2024-06-03 MED ORDER — MIDAZOLAM HCL 2 MG/2ML IJ SOLN
INTRAMUSCULAR | Status: AC
Start: 2024-06-03 — End: ?
  Filled 2024-06-03: qty 2

## 2024-06-03 MED ORDER — NITROGLYCERIN 1 MG/10 ML FOR IR/CATH LAB
INTRA_ARTERIAL | Status: AC
  Filled 2024-06-03: qty 10

## 2024-06-03 MED ORDER — LIDOCAINE HCL (PF) 1 % IJ SOLN
INTRAMUSCULAR | Status: AC
  Filled 2024-06-03: qty 30

## 2024-06-03 MED ORDER — CLOPIDOGREL BISULFATE 300 MG PO TABS
ORAL_TABLET | ORAL | Status: DC | PRN
  Administered 2024-06-03: 600 mg via ORAL

## 2024-06-03 MED ORDER — IOHEXOL 350 MG/ML SOLN
75.0000 mL | Freq: Once | INTRAVENOUS | Status: AC | PRN
  Administered 2024-06-03: 75 mL via INTRAVENOUS

## 2024-06-03 MED ORDER — HEPARIN SODIUM (PORCINE) 1000 UNIT/ML IJ SOLN
INTRAMUSCULAR | Status: AC
  Filled 2024-06-03: qty 10

## 2024-06-03 MED ORDER — FENTANYL CITRATE (PF) 100 MCG/2ML IJ SOLN
INTRAMUSCULAR | Status: AC
  Filled 2024-06-03: qty 2

## 2024-06-03 MED ORDER — LEVOTHYROXINE SODIUM 112 MCG PO TABS
112.0000 ug | ORAL_TABLET | Freq: Every day | ORAL | Status: DC
  Administered 2024-06-04: 112 ug via ORAL
  Filled 2024-06-03: qty 1

## 2024-06-03 MED ORDER — HEPARIN BOLUS VIA INFUSION
2000.0000 [IU] | Freq: Once | INTRAVENOUS | Status: AC
  Administered 2024-06-03: 2000 [IU] via INTRAVENOUS
  Filled 2024-06-03: qty 2000

## 2024-06-03 MED ORDER — SODIUM CHLORIDE 0.9% FLUSH: 3.0000 mL | INTRAVENOUS | Status: DC | PRN

## 2024-06-03 MED ORDER — MORPHINE SULFATE (PF) 4 MG/ML IV SOLN
4.0000 mg | Freq: Once | INTRAVENOUS | Status: AC
  Administered 2024-06-03: 4 mg via INTRAVENOUS
  Filled 2024-06-03: qty 1

## 2024-06-03 MED ORDER — NITROGLYCERIN 0.4 MG SL SUBL
0.4000 mg | SUBLINGUAL_TABLET | SUBLINGUAL | Status: DC | PRN
Start: 2024-06-03 — End: 2024-06-04

## 2024-06-03 MED ORDER — MIDAZOLAM HCL 2 MG/2ML IJ SOLN
INTRAMUSCULAR | Status: DC | PRN
  Administered 2024-06-03: 1 mg via INTRAVENOUS

## 2024-06-03 MED ORDER — NITROGLYCERIN 1 MG/10 ML FOR IR/CATH LAB
INTRA_ARTERIAL | Status: DC | PRN
  Administered 2024-06-03: 200 ug via INTRACORONARY

## 2024-06-03 MED ORDER — ESCITALOPRAM OXALATE 10 MG PO TABS
20.0000 mg | ORAL_TABLET | Freq: Every day | ORAL | Status: DC
  Administered 2024-06-03 – 2024-06-04 (×2): 20 mg via ORAL
  Filled 2024-06-03 (×2): qty 2

## 2024-06-03 MED ORDER — HEPARIN (PORCINE) IN NACL 1000-0.9 UT/500ML-% IV SOLN
INTRAVENOUS | Status: DC | PRN
  Administered 2024-06-03 (×3): 500 mL

## 2024-06-03 MED ORDER — VERAPAMIL HCL 2.5 MG/ML IV SOLN
INTRAVENOUS | Status: AC
  Filled 2024-06-03: qty 2

## 2024-06-03 MED ORDER — FENTANYL CITRATE (PF) 100 MCG/2ML IJ SOLN
INTRAMUSCULAR | Status: DC | PRN
  Administered 2024-06-03: 25 ug via INTRAVENOUS

## 2024-06-03 MED ORDER — CLOPIDOGREL BISULFATE 75 MG PO TABS
75.0000 mg | ORAL_TABLET | Freq: Every day | ORAL | Status: DC
  Administered 2024-06-04: 75 mg via ORAL
  Filled 2024-06-03: qty 1

## 2024-06-03 MED ORDER — SODIUM CHLORIDE 0.9% FLUSH
3.0000 mL | Freq: Two times a day (BID) | INTRAVENOUS | Status: DC
  Administered 2024-06-03: 3 mL via INTRAVENOUS

## 2024-06-03 MED ORDER — LIDOCAINE HCL (PF) 1 % IJ SOLN
INTRAMUSCULAR | Status: DC | PRN
Start: 2024-06-03 — End: 2024-06-03
  Administered 2024-06-03: 2 mL

## 2024-06-03 MED ORDER — SODIUM CHLORIDE 0.9 % IV SOLN
250.0000 mL | INTRAVENOUS | Status: DC | PRN
  Administered 2024-06-03: 250 mL via INTRAVENOUS

## 2024-06-03 MED ORDER — ACETAMINOPHEN 325 MG PO TABS: 650.0000 mg | ORAL_TABLET | ORAL | Status: DC | PRN

## 2024-06-03 MED ORDER — HEPARIN (PORCINE) 25000 UT/250ML-% IV SOLN
1000.0000 [IU]/h | INTRAVENOUS | Status: DC
  Administered 2024-06-03: 1000 [IU]/h via INTRAVENOUS
  Filled 2024-06-03: qty 250

## 2024-06-03 NOTE — Interval H&P Note (Signed)
 History and Physical Interval Note:  06/03/2024 4:16 PM  Hannah Pitts  has presented today for surgery, with the diagnosis of nstemi.  The various methods of treatment have been discussed with the patient and family. After consideration of risks, benefits and other options for treatment, the patient has consented to  Procedure(s): LEFT HEART CATH AND CORONARY ANGIOGRAPHY (N/A) as a surgical intervention.  The patient's history has been reviewed, patient examined, no change in status, stable for surgery.  I have reviewed the patient's chart and labs.  Questions were answered to the patient's satisfaction.   Cath Lab Visit (complete for each Cath Lab visit)  Clinical Evaluation Leading to the Procedure:   ACS: Yes.    Non-ACS:    Anginal Classification: CCS IV  Anti-ischemic medical therapy: No Therapy  Non-Invasive Test Results: No non-invasive testing performed  Prior CABG: No previous CABG        Donata Fryer [facility] 06/03/2024 4:16 PM

## 2024-06-03 NOTE — Progress Notes (Signed)
 PHARMACY - ANTICOAGULATION CONSULT NOTE  Pharmacy Consult for heparin  Indication: chest pain/ACS  Allergies  Allergen Reactions   Heparin  Other (See Comments)    Bruising/bleeding    Patient Measurements: Height: 5\' 5"  (165.1 cm) Weight: 94 kg (207 lb 3.7 oz) IBW/kg (Calculated) : 57 HEPARIN  DW (KG): 78.1  Vital Signs: Temp: 98.4 F (36.9 C) (06/06 0930) Temp Source: Oral (06/06 0930) BP: 123/74 (06/06 0930) Pulse Rate: 62 (06/06 0930)  Labs: Recent Labs    06/03/24 0538 06/03/24 0822  HGB 12.7  --   HCT 39.9  --   PLT 148*  --   CREATININE 1.28*  --   TROPONINIHS 315* 20,444*    Estimated Creatinine Clearance: 40.4 mL/min (A) (by C-G formula based on SCr of 1.28 mg/dL (H)).   Medical History: Past Medical History:  Diagnosis Date   Abnormal involuntary movements(781.0)    Anemia of other chronic disease    Depressive disorder, not elsewhere classified    Diaphragmatic hernia without mention of obstruction or gangrene    Dysthymic disorder    Generalized hyperhidrosis    GERD (gastroesophageal reflux disease)    H/O hiatal hernia    History of pulmonary embolus (PE)    Lymphoma (HCC) 03/2010   "they got it all w/surgery"   Meniere's disease of left ear    Migraine    "used to have classic kind; not anymore; last one was in 2012"   Other rheumatoid arthritis with visceral or systemic involvement    Palpitations    Personal history of other disorders of nervous system and sense organs    Systemic lupus erythematosus (HCC)    Unspecified disease of pericardium    Pericarditis    Unspecified hypothyroidism    Urticaria, unspecified     Medications:  Scheduled:  Infusions:   Assessment: 78 yo female presenting with CP. Hx of PE/DVT with IVC filter. Trop elevated 315 > 20,444. Not on anticoagulation PTA, previous xarelto  usage complicated by rectus abdominis hematoma and was discontinued. Pharmacy has been consulted to dose heparin  in the setting of  ACS.   Hgb 12.7, plt 148. Spoke with provider, due to past issues with bleeding will proceed with a half bolus only.   Goal of Therapy:  Heparin  level 0.3-0.7 units/ml Monitor platelets by anticoagulation protocol: Yes   Plan:  Heparin  2000 units x 1 bolus  Then, start heparin  1000 units/hr  8 hour heparin  level Daily heparin  level and CBC  Monitor for s/sx of bleeding  Hannah Pitts I Hannah Pitts 06/03/2024,10:31 AM

## 2024-06-03 NOTE — H&P (Addendum)
 Cardiology Admission History and Physical   Patient ID: Hannah Pitts MRN: [id number]; DOB: 1946-03-19   Admission date: 06/03/2024  PCP:  Donnie Galea, MD   [facility] Providers Cardiologist:  None  Chief Complaint:  Chest Pain   Patient Profile: Hannah Pitts is a 78 y.o. female with a past history of DVT/PE s/p IVC filter in 2018, pericarditis, orthostatic syncope, symptomatic PACs, aortic atherosclerosis, lymphoma, hypothyroidism, hepatic steatosis, GERD, chronic back pain, lupus  who is being seen 06/03/2024 for the evaluation of NSTEMI.  History of Present Illness: Hannah Pitts is a 78 year old female with above medical history.  Previously had nuclear stress testing completed in 10/2015 that showed normal myocardial perfusion, EF 59%.  She had a PE and bilateral DVTs in 2018.  Was started on Xarelto  but developed a left rectus abdominis hematoma and had an IVC filter placed.  Echocardiogram in 2018 showed EF 60-65%, no regional wall motion abnormalities.   She had been seen by her PCP in 11/2023 for follow-up of back pain, reportedly had also been having episodes of "blacking out".  She wore a cardiac monitor in 11/2023 that showed predominantly normal sinus rhythm with average heart rate 62 bpm, 1 run of NSVT, 13 episodes of SVT lasting up to 18 beats, rare PACs and PVCs.  She was seen by cardiology in 01/2024 for evaluation of syncope.  Orthostatic vital signs were positive in clinic.  Carotid ultrasound in 03/2024 showed near normal carotid arteries.  Echocardiogram in 03/2024 showed EF 55-60% with regional wall motion abnormalities, normal RV systolic function, no significant valvular abnormalities.  Patient was encouraged to increase hydration, use compression socks.   She presented to the ED 6/6 complaining of chest pain.  Chest pain started around 3 AM while she was watching TV.  Was located in the middle of her chest and radiated down her left arm.  Associated with  shortness of breath, diaphoresis.  She called EMS.  After receiving sublingual nitroglycerin and morphine, her symptoms improved but did not completely resolved.  Denies nausea, vomiting.  She is very cautious about blood thinners.  Reports that when she had her PE in 2018, she had significant bleeding from her IV sites when on blood thinners.  She denies GI bleeding or intracranial bleeding.  Reports that most of the bleeding was "from her skin".  After long discussion, she is willing to retry heparin  this admission  In the ED, initial vital signs showed heart rate 70 bpm, blood pressure 138/80, oxygen  98% on room air.  Labs in the ED significant for creatinine 1.28, potassium 4.5, WBC 7.0, hemoglobin 12.7, platelets 148.  High-sensitivity troponin 315> 20,444.  CTA chest showed no evidence of PE, cardiomegaly, coronary artery disease.  There was enlargement of the main pulmonary artery as can be seen in pulmonary hypertension.  Cardiology consulted for elevated troponins.  Past Medical History:  Diagnosis Date   Abnormal involuntary movements(781.0)    Anemia of other chronic disease    Depressive disorder, not elsewhere classified    Diaphragmatic hernia without mention of obstruction or gangrene    Dysthymic disorder    Generalized hyperhidrosis    GERD (gastroesophageal reflux disease)    H/O hiatal hernia    History of pulmonary embolus (PE)    Lymphoma (HCC) 03/2010   "they got it all w/surgery"   Meniere's disease of left ear    Migraine    "used to have classic kind; not anymore; last one was  in 2012"   Other rheumatoid arthritis with visceral or systemic involvement    Palpitations    Personal history of other disorders of nervous system and sense organs    Systemic lupus erythematosus (HCC)    Unspecified disease of pericardium    Pericarditis    Unspecified hypothyroidism    Urticaria, unspecified    Past Surgical History:  Procedure Laterality Date   ABDOMINAL HYSTERECTOMY   1978   APPENDECTOMY  1974   BREAST BIOPSY  03/2010   left breast;    BREAST LUMPECTOMY  03/2010   left breast; nonHodgkins lymphoma; "not breast cancer"   CHOLECYSTECTOMY  1974   ESOPHAGEAL DILATION  ?02/2011   ESOPHAGOGASTRODUODENOSCOPY  01-24-11   Slight stenosis esoph spasm   IR IVC FILTER PLMT / S&I /IMG GUID/MOD SED  12/17/2017   Left VATS  12/1997   LUMBAR DISC SURGERY  1977   shunt     put in behind left ear ; Menier's syndrome     Medications Prior to Admission: Prior to Admission medications   Medication Sig Start Date End Date Taking? Authorizing Provider  acetaminophen  (TYLENOL ) 500 MG tablet Take 500 mg by mouth every 6 (six) hours as needed.    [provider]  Cholecalciferol (VITAMIN D3) 25 MCG (1000 UT) CAPS Take 1,000 Units by mouth daily with breakfast.    [provider]  escitalopram  (LEXAPRO ) 20 MG tablet Take 1 tablet (20 mg total) by mouth daily. 05/20/24   Donnie Galea, MD  levothyroxine  (SYNTHROID ) 112 MCG tablet TAKE 1 TABLET (112 MCG TOTAL) BY MOUTH DAILY BEFORE BREAKFAST. EXCEPT SKIP SUNDAY. 6 TABS A WEEK. 04/12/24   Donnie Galea, MD  nystatin  (MYCOSTATIN /NYSTOP ) powder Apply 1 Application topically 2 (two) times daily. 07/16/22   Donnie Galea, MD  Polyethyl Glyc-Propyl Glyc PF (SYSTANE PRESERVATIVE FREE) 0.4-0.3 % SOLN Place 1-2 drops into both eyes 4 (four) times daily. Place 1-2 drops into both eyes four times a day due to Sjgren's syndrome    [provider]  pramipexole  (MIRAPEX ) 0.125 MG tablet TAKE 1 TABLET BY MOUTH AT BEDTIME. TO REPLACE REQUIP  04/12/24   Donnie Galea, MD  predniSONE  (DELTASONE ) 10 MG tablet Take 2 a day for 5 days, then 1 a day for 5 days, with food. Don't take with aleve/ibuprofen . 05/20/24   Donnie Galea, MD     Allergies:    Allergies  Allergen Reactions   Heparin  Other (See Comments)    Bruising/bleeding    Social History:   Social History   Socioeconomic History   Marital status:  Widowed    Spouse name: Not on file   Number of children: 2   Years of education: Not on file   Highest education level: Not on file  Occupational History   Occupation: Disabled     Employer: RETIRED  Tobacco Use   Smoking status: Never   Smokeless tobacco: Never  Vaping Use   Vaping status: Never Used  Substance and Sexual Activity   Alcohol  use: No   Drug use: No   Sexual activity: Never  Other Topics Concern   Not on file  Social History Narrative   Ms. Nordhoff lives in [place], [place].    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    Social  Drivers of Corporate investment banker Strain: Low Risk  (04/07/2024)   Overall Financial Resource Strain (CARDIA)    Difficulty of Paying Living Expenses: Not hard at all  Food Insecurity: No Food Insecurity (04/07/2024)   Hunger Vital Sign    Worried About Running Out of Food in the Last Year: Never true    Ran Out of Food in the Last Year: Never true  Transportation Needs: No Transportation Needs (04/07/2024)   PRAPARE - Administrator, Civil Service (Medical): No    Lack of Transportation (Non-Medical): No  Physical Activity: Insufficiently Active (04/07/2024)   Exercise Vital Sign    Days of Exercise per Week: 3 days    Minutes of Exercise per Session: 30 min  Stress: No Stress Concern Present (04/07/2024)   [employer] of Occupational Health - Occupational Stress Questionnaire    Feeling of Stress : Not at all  Social Connections: Moderately Isolated (04/07/2024)   Social Connection and Isolation Panel [NHANES]    Frequency of Communication with Friends and Family: Twice a week    Frequency of Social Gatherings with Friends and Family: Once a week    Attends Religious Services: 1 to 4 times per year    Active Member of [employer] or Organizations: No     Attends Banker Meetings: Never    Marital Status: Widowed  Intimate Partner Violence: Not At Risk (04/07/2024)   Humiliation, Afraid, Rape, and Kick questionnaire    Fear of Current or Ex-Partner: No    Emotionally Abused: No    Physically Abused: No    Sexually Abused: No     Family History:   The patient's family history includes Breast cancer in her maternal aunt; COPD in her sister; Cancer in her sister, sister, and sister; Coronary artery disease in her brother, sister, sister, and sister; Diabetes in her brother; Heart failure in her brother, mother, and sister; Stroke in her father. There is no history of Colon cancer.    ROS:  Please see the history of present illness.  All other ROS reviewed and negative.     Physical Exam/Data: Vitals:   06/03/24 0900 06/03/24 0915 06/03/24 0930 06/03/24 1125  BP: 128/67 115/65 123/74 (!) 123/55  Pulse:   62 66  Resp: (!) 21 (!) 23 (!) 26 (!) 23  Temp:   98.4 F (36.9 C)   TempSrc:   Oral   SpO2:   96% 96%  Weight:      Height:       No intake or output data in the 24 hours ending 06/03/24 1151    06/03/2024    5:32 AM 05/20/2024    3:14 PM 05/10/2024    1:30 PM  Last 3 Weights  Weight (lbs) 207 lb 3.7 oz 208 lb 204 lb  Weight (kg) 94 kg 94.348 kg 92.534 kg     Body mass index is 34.49 kg/m.  General: Elderly female, laying in the bed with head elevated.  No acute distress HEENT: normal Neck: no JVD Vascular:  Radial pulses 2+ bilaterally   Cardiac:  normal S1, S2; RRR; no murmur  Lungs:  clear to auscultation bilaterally, no wheezing, rhonchi or rales. Normal WOB on room air  Abd: soft, nontender  Ext: no edema in bilateral lower extremities Musculoskeletal:  No deformities  Skin: warm and dry  Neuro:  no focal abnormalities noted Psych:  Normal affect   EKG:  The ECG that was done 6/6 was  personally reviewed and demonstrates normal sinus rhythm, Q waves in leads III, aVF  Relevant CV  Studies:   Laboratory Data: High Sensitivity Troponin:   Recent Labs  Lab 06/03/24 0538 06/03/24 0822  TROPONINIHS 315* 20,444*      Chemistry Recent Labs  Lab 06/03/24 0538  NA 138  K 4.5  CL 102  CO2 27  GLUCOSE 120*  BUN 35*  CREATININE 1.28*  CALCIUM  9.2  GFRNONAA 43*  ANIONGAP 9    No results for input(s): "PROT", "ALBUMIN", "AST", "ALT", "ALKPHOS", "BILITOT" in the last 168 hours. Lipids No results for input(s): "CHOL", "TRIG", "HDL", "LABVLDL", "LDLCALC", "CHOLHDL" in the last 168 hours. Hematology Recent Labs  Lab 06/03/24 0538  WBC 7.0  RBC 4.31  HGB 12.7  HCT 39.9  MCV 92.6  MCH 29.5  MCHC 31.8  RDW 12.4  PLT 148*   Thyroid  No results for input(s): "TSH", "FREET4" in the last 168 hours. BNPNo results for input(s): "BNP", "PROBNP" in the last 168 hours.  DDimer No results for input(s): "DDIMER" in the last 168 hours.  Radiology/Studies:  CT Angio Chest PE W and/or Wo Contrast Result Date: 06/03/2024 CLINICAL DATA:  PE suspected, chest pain EXAM: CT ANGIOGRAPHY CHEST WITH CONTRAST TECHNIQUE: Multidetector CT imaging of the chest was performed using the standard protocol during bolus administration of intravenous contrast. Multiplanar CT image reconstructions and MIPs were obtained to evaluate the vascular anatomy. RADIATION DOSE REDUCTION: This exam was performed according to the departmental dose-optimization program which includes automated exposure control, adjustment of the mA and/or kV according to patient size and/or use of iterative reconstruction technique. CONTRAST:  75mL OMNIPAQUE IOHEXOL 350 MG/ML SOLN COMPARISON:  12/12/2017 FINDINGS: Cardiovascular: Satisfactory opacification of the pulmonary arteries to the segmental level. No evidence of pulmonary embolism. Cardiomegaly. Coronary artery calcifications. Enlargement of the main pulmonary artery measuring up to 3.6 cm in caliber. No pericardial effusion. Aortic atherosclerosis. Mediastinum/Nodes: No  enlarged mediastinal, hilar, or axillary lymph nodes. Thyroid  gland, trachea, and esophagus demonstrate no significant findings. Lungs/Pleura: Bandlike bibasilar scarring or atelectasis. No pleural effusion or pneumothorax. Upper Abdomen: No acute abnormality. Musculoskeletal: No chest wall abnormality. No acute osseous findings. Review of the MIP images confirms the above findings. IMPRESSION: 1. Negative examination for pulmonary embolism. 2. Cardiomegaly and coronary artery disease. 3. Enlargement of the main pulmonary artery, as can be seen in pulmonary hypertension. Aortic Atherosclerosis (ICD10-I70.0). Electronically Signed   By: Fredricka Jenny M.D.   On: 06/03/2024 07:12   DG Chest Portable 1 View Result Date: 06/03/2024 CLINICAL DATA:  Chest pain EXAM: PORTABLE CHEST 1 VIEW COMPARISON:  12/17/2017 FINDINGS: The patient is rotated towards the right. Heart size appears normal. Opacity along the retrocardiac region may reflect a dilated descending thoracic aorta. There is also increased opacity along the expected location of the ascending aorta which may also reflect a dilated aorta versus rotational artifact. No pleural fluid scratch asymmetric elevation of the right hemidiaphragm. No pleural fluid, interstitial edema or airspace disease. Remote right posterior rib fractures. IMPRESSION: Opacity along the retrocardiac region may reflect a dilated descending thoracic aorta. There is also increased opacity along the expected location of the ascending aorta which may also reflect a dilated aorta versus rotational artifact. Consider further evaluation with CT of the chest to exclude thoracic aortic aneurysm. Electronically Signed   By: Kimberley Penman M.D.   On: 06/03/2024 06:11     Assessment and Plan:  NSTEMI - Patient presented to the ED on 6/6 complaining  of chest pain that started while she was at rest.  High-sensitivity troponin 315> 20,444.  EKG with old inferior infarct, no acute ischemic changes -  Patient initially declined anticoagulation.  Reported that in 2018 while being treated for PE, she had bleeding while on Xarelto .  Discussed that her presentation is very concerning for NSTEMI and IV heparin  is an important part of the treatment plan. After discussion, patient willing to start heparin . Ordered heparin  per pharmacy consult  - Discussed cardiac catheterization, patient willing to proceed  - Ordered lipid panel to be collected in the AM. Start crestor 20 mg daily  - Ordered echocardiogram   Hypothyroidism  - Continue home dose synthroid  112 mcg tablet   Depression  - Continue home lexapro    Risk Assessment/Risk Scores:   TIMI Risk Score for Unstable Angina or Non-ST Elevation MI:   The patient's TIMI risk score is 2, which indicates a 8% risk of all cause mortality, new or recurrent myocardial infarction or need for urgent revascularization in the next 14 days.{    Code Status: DNR- discussed that this would be reversed for cath, she understands and agrees   Severity of Illness: The appropriate patient status for this patient is INPATIENT. Inpatient status is judged to be reasonable and necessary in order to provide the required intensity of service to ensure the patient's safety. The patient's presenting symptoms, physical exam findings, and initial radiographic and laboratory data in the context of their chronic comorbidities is felt to place them at high risk for further clinical deterioration. Furthermore, it is not anticipated that the patient will be medically stable for discharge from the hospital within 2 midnights of admission.   * I certify that at the point of admission it is my clinical judgment that the patient will require inpatient hospital care spanning beyond 2 midnights from the point of admission due to high intensity of service, high risk for further deterioration and high frequency of surveillance required.*  For questions or updates, please contact [facility] Please consult [email or web address] for contact info under     Signed, Debria Fang, PA-C  06/03/2024 11:51 AM

## 2024-06-03 NOTE — ED Provider Notes (Signed)
 Received signout from previous provider, please see his note for complete H&P.  78 year old female history of peripheral vascular disease, COPD, CHF, history of PE and DVT has IVC filter, obesity brought here via EMS from home with complaints of chest pain.  Patient report that she was watching TV in her bed and went approximately 3 hours ago she developed pain in her chest.  She described pain as a throbbing sensation to her left chest that radiates to her jaw and to her left arm with some associated shortness of breath.  She did not endorse any nausea no lightheadedness or dizziness no diaphoresis.  She initially rates the pain as 5 out of 10 which has shown some improvement after she received morphine  given earlier.  Currently rates pain as 3 out of 10.  She does not endorse any fever or chills no productive cough no leg swelling  On exam, patient is resting comfortably appears to be in no acute discomfort.  Heart with normal rate and rhythm, lungs clear to auscultation bilaterally abdomen is soft and nontender, no significant peripheral edema appreciated, she has intact pulses throughout all 4 extremities.  -Labs ordered, independently viewed and interpreted by me.  Labs remarkable for initial troponin of 315.  Cardiology was promptly consulted who felt patient should have a PE study given her previous history of PE and DVT not on anticoagulant and does have an IVC filter.  Will order PE study.  Her creatinine is 1.28. -The patient was maintained on a cardiac monitor.  I personally viewed and interpreted the cardiac monitored which showed an underlying rhythm of: Normal sinus rhythm -Imaging independently viewed and interpreted by me and I agree with radiologist's interpretation.  Result remarkable for chest x-ray shows an opacity along the retrocardiac region which may reflect a dilated descending thoracic aorta.  Radiologist initially recommended to further evaluation of this finding with a CT of the  chest to exclude thoracic aortic aneurysm.  After discussion with cardiologist, we felt her pain is less likely to be dissection and due to history of previous PE, a chest CT angiogram was performed for further assessment.  CT scan obtained independent viewed interpreted by me and fortunately no evidence of PE.  There evidence of cardiomegaly and coronary artery disease were noted.  The enlargement of the main pulmonary artery which can be seen in pulmonary hypertension is likely finding that was initially noted on the portable chest x-ray.  Low suspicion for thoracic aortic aneurysm. -This patient presents to the ED for concern of chest pain, this involves an extensive number of treatment options, and is a complaint that carries with it a high risk of complications and morbidity.  The differential diagnosis includes ACS, GERD, PE, pneumonia, dissection, gastritis, musculoskeletal pain -Co morbidities that complicate the patient evaluation includes COPD, CHF, PE, DVT, peripheral vascular disease -Treatment includes morphine  -Reevaluation of the patient after these medicines showed that the patient improved -PCP office notes or outside notes reviewed -Discussion with specialist cardiology specialist are recommending PE study and cardiology team will evaluate patient. -Escalation to admission/observation considered:   Cardiology was immediately consulted once that the initial troponin was elevated at 315.  At that time my cardiologist as requested to do a PE study first.  They have not seen patient and on a repeat troponin it is markedly elevated in the 20,000's.  Will obtain a repeat EKG.  Repeat EKG without acute ischemic changes and unchanged from recent EKG.  EMR review, patient has PE  DVT in 2018 complicated by left rectus abdominis hematoma while on Xarelto  status post IVC filter.  She is currently not on any blood thinner medication.  Currently her chest CT angiogram shows no evidence of PE.  I  have reached out to cardiology team who will see patient immediately and will likely perform heart catheterization.  Heparin  was not started.  Patient refused ASA initially.  Currently patient is without any active chest pain.  .Critical Care  Performed by: Debbra Fairy, PA-C Authorized by: Debbra Fairy, PA-C   Critical care provider statement:    Critical care time (minutes):  45   Critical care was time spent personally by me on the following activities:  Development of treatment plan with patient or surrogate, discussions with consultants, evaluation of patient's response to treatment, examination of patient, ordering and review of laboratory studies, ordering and review of radiographic studies, ordering and performing treatments and interventions, pulse oximetry, re-evaluation of patient's condition and review of old charts  BP 123/74   Pulse 62   Temp 98.4 F (36.9 C) (Oral)   Resp (!) 26   Ht 5\' 5"  (1.651 m)   Wt 94 kg   SpO2 96%   BMI 34.49 kg/m    EKG Interpretation Date/Time:  Friday June 03 2024 09:56:51 EDT Ventricular Rate:  61 PR Interval:  128 QRS Duration:  96 QT Interval:  435 QTC Calculation: 439 R Axis:   -44  Text Interpretation: Sinus rhythm Left axis deviation Abnormal R-wave progression, late transition No significant change since last tracing Confirmed by Lind Repine ([id number]) on 06/03/2024 10:02:02 AM       Results for orders placed or performed during the hospital encounter of 06/03/24  Basic metabolic panel   Collection Time: 06/03/24  5:38 AM  Result Value Ref Range   Sodium 138 135 - 145 mmol/L   Potassium 4.5 3.5 - 5.1 mmol/L   Chloride 102 98 - 111 mmol/L   CO2 27 22 - 32 mmol/L   Glucose, Bld 120 (H) 70 - 99 mg/dL   BUN 35 (H) 8 - 23 mg/dL   Creatinine, Ser 2.13 (H) 0.44 - 1.00 mg/dL   Calcium  9.2 8.9 - 10.3 mg/dL   GFR, Estimated 43 (L) >60 mL/min   Anion gap 9 5 - 15  CBC   Collection Time: 06/03/24  5:38 AM  Result Value Ref Range    WBC 7.0 4.0 - 10.5 K/uL   RBC 4.31 3.87 - 5.11 MIL/uL   Hemoglobin 12.7 12.0 - 15.0 g/dL   HCT 08.6 57.8 - 46.9 %   MCV 92.6 80.0 - 100.0 fL   MCH 29.5 26.0 - 34.0 pg   MCHC 31.8 30.0 - 36.0 g/dL   RDW 62.9 52.8 - 41.3 %   Platelets 148 (L) 150 - 400 K/uL   nRBC 0.0 0.0 - 0.2 %  Troponin I (High Sensitivity)   Collection Time: 06/03/24  5:38 AM  Result Value Ref Range   Troponin I (High Sensitivity) 315 (HH) <18 ng/L  Troponin I (High Sensitivity)   Collection Time: 06/03/24  8:22 AM  Result Value Ref Range   Troponin I (High Sensitivity) 20,444 (HH) <18 ng/L   CT Angio Chest PE W and/or Wo Contrast Result Date: 06/03/2024 CLINICAL DATA:  PE suspected, chest pain EXAM: CT ANGIOGRAPHY CHEST WITH CONTRAST TECHNIQUE: Multidetector CT imaging of the chest was performed using the standard protocol during bolus administration of intravenous contrast. Multiplanar CT image reconstructions and MIPs were  obtained to evaluate the vascular anatomy. RADIATION DOSE REDUCTION: This exam was performed according to the departmental dose-optimization program which includes automated exposure control, adjustment of the mA and/or kV according to patient size and/or use of iterative reconstruction technique. CONTRAST:  75mL OMNIPAQUE  IOHEXOL  350 MG/ML SOLN COMPARISON:  12/12/2017 FINDINGS: Cardiovascular: Satisfactory opacification of the pulmonary arteries to the segmental level. No evidence of pulmonary embolism. Cardiomegaly. Coronary artery calcifications. Enlargement of the main pulmonary artery measuring up to 3.6 cm in caliber. No pericardial effusion. Aortic atherosclerosis. Mediastinum/Nodes: No enlarged mediastinal, hilar, or axillary lymph nodes. Thyroid  gland, trachea, and esophagus demonstrate no significant findings. Lungs/Pleura: Bandlike bibasilar scarring or atelectasis. No pleural effusion or pneumothorax. Upper Abdomen: No acute abnormality. Musculoskeletal: No chest wall abnormality. No acute  osseous findings. Review of the MIP images confirms the above findings. IMPRESSION: 1. Negative examination for pulmonary embolism. 2. Cardiomegaly and coronary artery disease. 3. Enlargement of the main pulmonary artery, as can be seen in pulmonary hypertension. Aortic Atherosclerosis (ICD10-I70.0). Electronically Signed   By: Fredricka Jenny M.D.   On: 06/03/2024 07:12   DG Chest Portable 1 View Result Date: 06/03/2024 CLINICAL DATA:  Chest pain EXAM: PORTABLE CHEST 1 VIEW COMPARISON:  12/17/2017 FINDINGS: The patient is rotated towards the right. Heart size appears normal. Opacity along the retrocardiac region may reflect a dilated descending thoracic aorta. There is also increased opacity along the expected location of the ascending aorta which may also reflect a dilated aorta versus rotational artifact. No pleural fluid scratch asymmetric elevation of the right hemidiaphragm. No pleural fluid, interstitial edema or airspace disease. Remote right posterior rib fractures. IMPRESSION: Opacity along the retrocardiac region may reflect a dilated descending thoracic aorta. There is also increased opacity along the expected location of the ascending aorta which may also reflect a dilated aorta versus rotational artifact. Consider further evaluation with CT of the chest to exclude thoracic aortic aneurysm. Electronically Signed   By: Kimberley Penman M.D.   On: 06/03/2024 06:11      Debbra Fairy, PA-C 06/03/24 1004    Lind Repine, MD 06/04/24 [phone]

## 2024-06-03 NOTE — ED Triage Notes (Signed)
 Pt comes from home by EMS with c/o CP that woke her up from her sleep. L sided, started spreading out after nitroglycerin given by EMS. Reports more pain than pressure, describes it like a heartburn sensation. 5/10. Denies anything making it better. Hx PE and DVT. Reports allergy to aspirin  and blood thinners. Pt recently worked up by cardiology for syncopal episodes.

## 2024-06-03 NOTE — ED Notes (Signed)
Received report from [place], Charity fundraiser.

## 2024-06-03 NOTE — ED Provider Notes (Signed)
 [facility] Emergency Department Provider Note MRN:  [id number]  Arrival date & time: 06/03/24     Chief Complaint   Chest Pain   History of Present Illness   Hannah Pitts is a 78 y.o. year-old female presents to the ED with chief complaint of chest pain that started around 3 AM.  She states that it is constant.  She states that it is central and radiates to her left arm into her jaw.  She states that she had some shortness of breath earlier, but this has resolved.  She cannot take aspirin .  She was given sublingual nitroglycerin with EMS.  She states that she is still having the pain now.  Hx of prior PE, DVT, lupus, but states she can't take anticoagulants because she "almost bled out."  History provided by patient.   Review of Systems  Pertinent positive and negative review of systems noted in HPI.    Physical Exam   Vitals:   06/03/24 0529 06/03/24 0530  BP:  138/80  Pulse:  69  Resp:  20  Temp:  98.3 F (36.8 C)  SpO2: 98% 98%    CONSTITUTIONAL:  non toxic-appearing, NAD NEURO:  Alert and oriented x 3, CN 3-12 grossly intact EYES:  eyes equal and reactive ENT/NECK:  Supple, no stridor  CARDIO:  normal rate, regular rhythm, appears well-perfused  PULM:  No respiratory distress, CTAB GI/GU:  non-distended, no focal abdominal tenderness MSK/SPINE:  No gross deformities, no edema, moves all extremities  SKIN:  no rash, atraumatic   *Additional and/or pertinent findings included in MDM below  Diagnostic and Interventional Summary    EKG Interpretation Date/Time:  Friday June 03 2024 05:25:39 EDT Ventricular Rate:  70 PR Interval:  134 QRS Duration:  98 QT Interval:  409 QTC Calculation: 442 R Axis:   -59  Text Interpretation: Sinus rhythm Abnormal R-wave progression, late transition Inferior infarct, old Interpretation limited secondary to artifact Confirmed by Eldon Greenland ([id number]) on 06/03/2024 5:35:57 AM       Labs  Reviewed  BASIC METABOLIC PANEL WITH GFR - Abnormal; Notable for the following components:      Result Value   Glucose, Bld 120 (*)    BUN 35 (*)    Creatinine, Ser 1.28 (*)    GFR, Estimated 43 (*)    All other components within normal limits  CBC - Abnormal; Notable for the following components:   Platelets 148 (*)    All other components within normal limits  TROPONIN I (HIGH SENSITIVITY)    DG Chest Portable 1 View  Final Result    CT Angio Chest/Abd/Pel for Dissection W and/or Wo Contrast    (Results Pending)    Medications  morphine (PF) 4 MG/ML injection 4 mg (4 mg Intravenous Given 06/03/24 4332)     Procedures  /  Critical Care Procedures  ED Course and Medical Decision Making  I have reviewed the triage vital signs, the nursing notes, and pertinent available records from the EMR.  Social Determinants Affecting Complexity of Care: Patient has no clinically significant social determinants affecting this chief complaint..   ED Course:    Medical Decision Making Amount and/or Complexity of Data Reviewed Labs: ordered. Radiology: ordered.  Risk Prescription drug management.         Consultants:    Treatment and Plan: Patient signed out at shift change to oncoming team.    Plan: F/u on 2nd trop and CT dissection.  Final Clinical Impressions(s) / ED Diagnoses  No diagnosis found.  ED Discharge Orders     None         Discharge Instructions Discussed with and Provided to Patient:   Discharge Instructions   None      Sherel Dikes, PA-C 06/03/24 3086    Eldon Greenland, MD 06/03/24 [phone]

## 2024-06-04 ENCOUNTER — Other Ambulatory Visit (HOSPITAL_COMMUNITY): Payer: Self-pay

## 2024-06-04 ENCOUNTER — Inpatient Hospital Stay (HOSPITAL_COMMUNITY)

## 2024-06-04 ENCOUNTER — Encounter (HOSPITAL_COMMUNITY): Payer: Self-pay | Admitting: Cardiology

## 2024-06-04 DIAGNOSIS — E785 Hyperlipidemia, unspecified: Secondary | ICD-10-CM | POA: Diagnosis not present

## 2024-06-04 DIAGNOSIS — Z86718 Personal history of other venous thrombosis and embolism: Secondary | ICD-10-CM | POA: Diagnosis not present

## 2024-06-04 DIAGNOSIS — E039 Hypothyroidism, unspecified: Secondary | ICD-10-CM | POA: Diagnosis not present

## 2024-06-04 DIAGNOSIS — Z86711 Personal history of pulmonary embolism: Secondary | ICD-10-CM | POA: Diagnosis not present

## 2024-06-04 DIAGNOSIS — F32A Depression, unspecified: Secondary | ICD-10-CM | POA: Diagnosis not present

## 2024-06-04 DIAGNOSIS — Z79899 Other long term (current) drug therapy: Secondary | ICD-10-CM | POA: Diagnosis not present

## 2024-06-04 DIAGNOSIS — I214 Non-ST elevation (NSTEMI) myocardial infarction: Secondary | ICD-10-CM | POA: Diagnosis not present

## 2024-06-04 LAB — BASIC METABOLIC PANEL WITH GFR
Anion gap: 7 (ref 5–15)
BUN: 34 mg/dL — ABNORMAL HIGH (ref 8–23)
CO2: 27 mmol/L (ref 22–32)
Calcium: 8.6 mg/dL — ABNORMAL LOW (ref 8.9–10.3)
Chloride: 104 mmol/L (ref 98–111)
Creatinine, Ser: 1.3 mg/dL — ABNORMAL HIGH (ref 0.44–1.00)
GFR, Estimated: 42 mL/min — ABNORMAL LOW (ref >60)
Glucose, Bld: 108 mg/dL — ABNORMAL HIGH (ref 70–99)
Potassium: 4.1 mmol/L (ref 3.5–5.1)
Sodium: 138 mmol/L (ref 135–145)

## 2024-06-04 LAB — CBC
HCT: 39.8 % (ref 36.0–46.0)
Hemoglobin: 12.7 g/dL (ref 12.0–15.0)
MCH: 29.3 pg (ref 26.0–34.0)
MCHC: 31.9 g/dL (ref 30.0–36.0)
MCV: 91.7 fL (ref 80.0–100.0)
Platelets: 134 10*3/uL — ABNORMAL LOW (ref 150–400)
RBC: 4.34 MIL/uL (ref 3.87–5.11)
RDW: 12.7 % (ref 11.5–15.5)
WBC: 5.1 10*3/uL (ref 4.0–10.5)
nRBC: 0 % (ref 0.0–0.2)

## 2024-06-04 LAB — LIPID PANEL
Cholesterol: 189 mg/dL (ref 0–200)
HDL: 34 mg/dL — ABNORMAL LOW (ref >40)
LDL Cholesterol: 115 mg/dL — ABNORMAL HIGH (ref 0–99)
Total CHOL/HDL Ratio: 5.6 ratio
Triglycerides: 202 mg/dL — ABNORMAL HIGH (ref <150)
VLDL: 40 mg/dL (ref 0–40)

## 2024-06-04 LAB — GLUCOSE, CAPILLARY
Glucose-Capillary: 100 mg/dL — ABNORMAL HIGH (ref 70–99)
Glucose-Capillary: 108 mg/dL — ABNORMAL HIGH (ref 70–99)

## 2024-06-04 MED ORDER — ATORVASTATIN CALCIUM 40 MG PO TABS
40.0000 mg | ORAL_TABLET | Freq: Every day | ORAL | Status: DC
  Administered 2024-06-04: 40 mg via ORAL
  Filled 2024-06-04: qty 1

## 2024-06-04 MED ORDER — NITROGLYCERIN 0.4 MG SL SUBL
0.4000 mg | SUBLINGUAL_TABLET | SUBLINGUAL | 1 refills | Status: AC | PRN
  Filled 2024-06-04: qty 25, 5d supply, fill #0

## 2024-06-04 MED ORDER — ATORVASTATIN CALCIUM 40 MG PO TABS
40.0000 mg | ORAL_TABLET | Freq: Every day | ORAL | 3 refills | Status: DC
  Filled 2024-06-04: qty 90, 90d supply, fill #0

## 2024-06-04 MED ORDER — CLOPIDOGREL BISULFATE 75 MG PO TABS
75.0000 mg | ORAL_TABLET | Freq: Every day | ORAL | 3 refills | Status: DC
  Filled 2024-06-04: qty 90, 90d supply, fill #0

## 2024-06-04 MED ORDER — ASPIRIN 81 MG PO TBEC
81.0000 mg | DELAYED_RELEASE_TABLET | Freq: Every day | ORAL | 1 refills | Status: DC
  Filled 2024-06-04: qty 150, 150d supply, fill #0

## 2024-06-04 NOTE — Progress Notes (Signed)
 CARDIAC REHAB PHASE I   PRE:  Rate/Rhythm: 73 SR  BP:  Supine: 119/98   SaO2: 92 RA  MODE:  Ambulation: 100 ft   POST:  Rate/Rhythm: 72 SR  BP:  Supine: 111/77   SaO2: 94 RA  Pt to standing with min assist and walker. Pt to bathroom and the amb in hallway using walker, tolerated well with no s/sx. Uses walker/rollator at home, limited by fatigue. Returned to bed and educated on restrictions, site care, angina/NTG, MI Booklet, exercise guidelines, HH nutrition, and CRP2. Referral for CRP2 to be sent to [facility]. All questions answered, pt left in bed with call bell and phone in reach. Of note, pt is [facility].  [id number]   Arlander Labrum, MS, ACSM-CEP 06/04/2024 8:53 AM

## 2024-06-04 NOTE — Care Management CC44 (Signed)
 Condition Code 44 Documentation Completed  Patient Details  Name: PHILAMENA KRAMAR MRN: [id number] Date of Birth: Oct 01, 1946   Condition Code 44 given:  Yes Patient signature on Condition Code 44 notice:  Yes Documentation of 2 MD's agreement:  Yes Code 44 added to claim:  Yes    Jannine Meo, RN 06/04/2024, 10:18 AM

## 2024-06-04 NOTE — Plan of Care (Signed)
  Problem: Education: Goal: Understanding of cardiac disease, CV risk reduction, and recovery process will improve Outcome: Progressing   Problem: Activity: Goal: Ability to tolerate increased activity will improve Outcome: Progressing   Problem: Cardiac: Goal: Ability to achieve and maintain adequate cardiovascular perfusion will improve Outcome: Progressing   Problem: Skin Integrity: Goal: Risk for impaired skin integrity will decrease Outcome: Progressing

## 2024-06-04 NOTE — Care Management Obs Status (Signed)
 MEDICARE OBSERVATION STATUS NOTIFICATION   Patient Details  Name: Hannah Pitts MRN: [id number] Date of Birth: 1946/07/27   Medicare Observation Status Notification Given:  Yes    Jannine Meo, RN 06/04/2024, 10:17 AM

## 2024-06-04 NOTE — Progress Notes (Signed)
   Rounding Note    Patient Name: Hannah Pitts Date of Encounter: 06/04/2024  [facility] Cardiologist: None   Subjective   NAEO. Feels good this AM without CP.  Vital Signs    Vitals:   06/03/24 2013 06/03/24 2300 06/04/24 0447 06/04/24 0749  BP: 120/76 126/69 115/64 133/69  Pulse: 68 66 65   Resp: (!) 25 (!) 22 (!) 21   Temp: 98.4 F (36.9 C) 98.3 F (36.8 C) 98.5 F (36.9 C) 98.3 F (36.8 C)  TempSrc: Oral Oral Oral Oral  SpO2: 95% 95% 94% 93%  Weight:      Height:        Intake/Output Summary (Last 24 hours) at 06/04/2024 0812 Last data filed at 06/04/2024 0148 Gross per 24 hour  Intake 240 ml  Output 400 ml  Net -160 ml      06/03/2024    5:32 AM 05/20/2024    3:14 PM 05/10/2024    1:30 PM  Last 3 Weights  Weight (lbs) 207 lb 3.7 oz 208 lb 204 lb  Weight (kg) 94 kg 94.348 kg 92.534 kg      Telemetry    Personally Reviewed  Physical Exam   GEN: No acute distress.   Cardiac: RRR, no murmurs, rubs, or gallops.  Respiratory: Clear to auscultation bilaterally. Psych: Normal affect   Assessment & Plan    #NSTEMI Doing well. No CP this AM. Cont asa 81 Cont plavix  Cont atorva 40  OK to discharge today.    Donelda Fujita T. Marven Slimmer, MD, [facility], [facility] Cardiac Electrophysiology

## 2024-06-04 NOTE — Discharge Summary (Signed)
 Discharge Summary   Patient ID: Hannah Pitts MRN: [id number]; DOB: 03/21/1946  Admit date: 06/03/2024 Discharge date: 06/04/2024  PCP:  Donnie Galea, MD   [facility] Providers Cardiologist:  None   - Planning to establish with Dr. Jerelene Monday in [facility]  Discharge Diagnoses  Principal Problem:   NSTEMI (non-ST elevated myocardial infarction) [facility])   Diagnostic Studies/Procedures   Left Heart Catheterization 06/03/24    Prox RCA to Mid RCA lesion is 85% stenosed.   Mid Cx to Dist Cx lesion is 90% stenosed.   Ost LAD to Mid LAD lesion is 15% stenosed.   A drug-eluting stent was successfully placed using a STENT SYNERGY XD 2.75X12.   A drug-eluting stent was successfully placed using a STENT SYNERGY XD 2.50X20.   Post intervention, there is a 0% residual stenosis.   Post intervention, there is a 0% residual stenosis.   The left ventricular systolic function is normal.   LV end diastolic pressure is normal.   The left ventricular ejection fraction is 55-65% by visual estimate.   Recommend uninterrupted dual antiplatelet therapy with Aspirin  81mg  daily and Clopidogrel  75mg  daily for a minimum of 12 months (ACS-Class I recommendation).   Severe 2 vessel obstructive CAD Normal LV function Normal LVEDP Successful PCI of the mid LCx with DES x1 Successful PCI of the mid RCA with DES x 1   Plan: DAPT for one year. Anticipate DC in am.  _____________   History of Present Illness   Hannah GOOTEE is a 78 y.o. female with  a past history of DVT/PE s/p IVC filter in 2018, pericarditis, orthostatic syncope, symptomatic PACs, aortic atherosclerosis, lymphoma, hypothyroidism, hepatic steatosis, GERD, chronic back pain, lupus.   Previously had nuclear stress testing completed in 10/2015 that showed normal myocardial perfusion, EF 59%.  She had a PE and bilateral DVTs in 2018.  Was started on Xarelto  but developed a left rectus abdominis hematoma and had an IVC filter placed.   Echocardiogram in 2018 showed EF 60-65%, no regional wall motion abnormalities.    She had been seen by her PCP in 11/2023 for follow-up of back pain, reportedly had also been having episodes of "blacking out".  She wore a cardiac monitor in 11/2023 that showed predominantly normal sinus rhythm with average heart rate 62 bpm, 1 run of NSVT, 13 episodes of SVT lasting up to 18 beats, rare PACs and PVCs.  She was seen by cardiology in 01/2024 for evaluation of syncope.  Orthostatic vital signs were positive in clinic.  Carotid ultrasound in 03/2024 showed near normal carotid arteries.  Echocardiogram in 03/2024 showed EF 55-60% with regional wall motion abnormalities, normal RV systolic function, no significant valvular abnormalities.  Patient was encouraged to increase hydration, use compression socks.    She presented to the ED 6/6 complaining of chest pain.  Chest pain started around 3 AM while she was watching TV.  Was located in the middle of her chest and radiated down her left arm.  Associated with shortness of breath, diaphoresis.  She called EMS.  After receiving sublingual nitroglycerin  and morphine , her symptoms improved but did not completely resolved.  Denies nausea, vomiting.  She is very cautious about blood thinners.  Reports that when she had her PE in 2018, she had significant bleeding from her IV sites when on blood thinners.  She denies GI bleeding or intracranial bleeding.  Reports that most of the bleeding was "from her skin".  After long discussion, she was  willing to be started on heparin  and undergo cath   In the ED, initial vital signs showed heart rate 70 bpm, blood pressure 138/80, oxygen  98% on room air.  Labs in the ED significant for creatinine 1.28, potassium 4.5, WBC 7.0, hemoglobin 12.7, platelets 148.  High-sensitivity troponin 315> 20,444.  CTA chest showed no evidence of PE, cardiomegaly, coronary artery disease.  There was enlargement of the main pulmonary artery as can be seen  in pulmonary hypertension.  She was admitted to the cardiology service for treatment of NSTEMI    Hospital Course    NSTEMI  - Patient presented with chest pain that started suddenly while she was at rest.  High-sensitivity troponin 315> 20,444 - CTA chest showed no evidence of PE.  Did note cardiomegaly, coronary artery disease - EKG without acute ST changes, did show old inferior infarct - Underwent cardiac catheterization on 06/03/2024 that showed severe two-vessel obstructive CAD.  Underwent successful PCI of the mid left circumflex with DES x 1 and successful PCI of the mid RCA with DES x 1.  On cath there was normal LV function, normal LVEDP with EF 55-65% by visual estimate - Recommending DAPT for 1 year with aspirin  81 mg daily, Plavix  75 mg daily - Continue Lipitor 40 mg daily - Patient reported feeling well on 6/7 it was without chest pain.  She was seen and evaluated by Dr. Marven Slimmer, deemed stable for discharge -Needs outpatient echo.  Per Dr. Marven Slimmer, patient was not having symptoms of heart failure and her EF was normal on cath.  Echo did not need to be completed as an inpatient - Right ulnar cath site was soft, nontender prior to DC. 2+ ulnar pulse present. There was some bruising noted, but area was soft and nontender. Discussed cath site care and provided written instructions on AVS  - Arranged outpatient follow up with Varney Gentleman PA-C on 6/13   Hypothyroidism  - Continue home dose synthroid  112 mcg tablet    Depression  - Continue home lexapro    HLD  - Lipid panel this admission showed LDL 115, HDL 34, triglycerides 202 - Lipitor 40 mg daily start this admission  - Repeat lipid panel, LFTs in 8 weeks   Did the patient have an acute coronary syndrome (MI, NSTEMI, STEMI, etc) this admission?:  Yes                               AHA/ACC ACS Clinical Performance & Quality Measures: Aspirin  prescribed? - Yes ADP Receptor Inhibitor (Plavix /Clopidogrel , Brilinta/Ticagrelor or  Effient/Prasugrel) prescribed (includes medically managed patients)? - Yes Beta Blocker prescribed? - No. The patient has an EF >/= 50%. Based upon the [facility] Study pub in Apr 2024, there is no benefit in patients with preserved EF post MI. High Intensity Statin (Lipitor 40-80mg  or Crestor 20-40mg ) prescribed? - Yes EF assessed during THIS hospitalization? - No - Outpatient Echocardiogram will be scheduled to assess EF. For EF <40%, was ACEI/ARB prescribed? - No - Outpatient Echocardiogram to assess EF will be scheduled. For EF <40%, Aldosterone Antagonist (Spironolactone or Eplerenone) prescribed? - No - Outpatient Echocardiogram to assess EF will be scheduled. Cardiac Rehab Phase II ordered (including medically managed patients)? - Yes   _____________  Discharge Vitals Blood pressure 133/69, pulse 65, temperature 98.3 F (36.8 C), temperature source Oral, resp. rate (!) 21, height 5\' 5"  (1.651 m), weight 94 kg, SpO2 93%.  Filed Weights  06/03/24 0532  Weight: 94 kg    Labs & Radiologic Studies  CBC Recent Labs    06/03/24 0538 06/04/24 0216  WBC 7.0 5.1  HGB 12.7 12.7  HCT 39.9 39.8  MCV 92.6 91.7  PLT 148* 134*   Basic Metabolic Panel Recent Labs    16/10/96 0538 06/04/24 0216  NA 138 138  K 4.5 4.1  CL 102 104  CO2 27 27  GLUCOSE 120* 108*  BUN 35* 34*  CREATININE 1.28* 1.30*  CALCIUM  9.2 8.6*   Liver Function Tests No results for input(s): "AST", "ALT", "ALKPHOS", "BILITOT", "PROT", "ALBUMIN" in the last 72 hours. No results for input(s): "LIPASE", "AMYLASE" in the last 72 hours. High Sensitivity Troponin:   Recent Labs  Lab 06/03/24 0538 06/03/24 0822  TROPONINIHS 315* 20,444*    No results for input(s): "TRNPT" in the last 720 hours.  BNP Invalid input(s): "POCBNP" No results for input(s): "PROBNP" in the last 72 hours.  No results for input(s): "BNP" in the last 72 hours.  D-Dimer No results for input(s): "DDIMER" in the last 72  hours. Hemoglobin A1C No results for input(s): "HGBA1C" in the last 72 hours. Fasting Lipid Panel Recent Labs    06/04/24 0216  CHOL 189  HDL 34*  LDLCALC 115*  TRIG 202*  CHOLHDL 5.6   No results found for: "LIPOA"  Thyroid  Function Tests No results for input(s): "TSH", "T4TOTAL", "T3FREE", "THYROIDAB" in the last 72 hours.  Invalid input(s): "FREET3" _____________  CARDIAC CATHETERIZATION Result Date: 06/03/2024   Prox RCA to Mid RCA lesion is 85% stenosed.   Mid Cx to Dist Cx lesion is 90% stenosed.   Ost LAD to Mid LAD lesion is 15% stenosed.   A drug-eluting stent was successfully placed using a STENT SYNERGY XD 2.75X12.   A drug-eluting stent was successfully placed using a STENT SYNERGY XD 2.50X20.   Post intervention, there is a 0% residual stenosis.   Post intervention, there is a 0% residual stenosis.   The left ventricular systolic function is normal.   LV end diastolic pressure is normal.   The left ventricular ejection fraction is 55-65% by visual estimate.   Recommend uninterrupted dual antiplatelet therapy with Aspirin  81mg  daily and Clopidogrel  75mg  daily for a minimum of 12 months (ACS-Class I recommendation). Severe 2 vessel obstructive CAD Normal LV function Normal LVEDP Successful PCI of the mid LCx with DES x1 Successful PCI of the mid RCA with DES x 1 Plan: DAPT for one year. Anticipate DC in am.   CT Angio Chest PE W and/or Wo Contrast Result Date: 06/03/2024 CLINICAL DATA:  PE suspected, chest pain EXAM: CT ANGIOGRAPHY CHEST WITH CONTRAST TECHNIQUE: Multidetector CT imaging of the chest was performed using the standard protocol during bolus administration of intravenous contrast. Multiplanar CT image reconstructions and MIPs were obtained to evaluate the vascular anatomy. RADIATION DOSE REDUCTION: This exam was performed according to the departmental dose-optimization program which includes automated exposure control, adjustment of the mA and/or kV according to patient  size and/or use of iterative reconstruction technique. CONTRAST:  75mL OMNIPAQUE  IOHEXOL  350 MG/ML SOLN COMPARISON:  12/12/2017 FINDINGS: Cardiovascular: Satisfactory opacification of the pulmonary arteries to the segmental level. No evidence of pulmonary embolism. Cardiomegaly. Coronary artery calcifications. Enlargement of the main pulmonary artery measuring up to 3.6 cm in caliber. No pericardial effusion. Aortic atherosclerosis. Mediastinum/Nodes: No enlarged mediastinal, hilar, or axillary lymph nodes. Thyroid  gland, trachea, and esophagus demonstrate no significant findings. Lungs/Pleura: Bandlike bibasilar scarring or  atelectasis. No pleural effusion or pneumothorax. Upper Abdomen: No acute abnormality. Musculoskeletal: No chest wall abnormality. No acute osseous findings. Review of the MIP images confirms the above findings. IMPRESSION: 1. Negative examination for pulmonary embolism. 2. Cardiomegaly and coronary artery disease. 3. Enlargement of the main pulmonary artery, as can be seen in pulmonary hypertension. Aortic Atherosclerosis (ICD10-I70.0). Electronically Signed   By: Fredricka Jenny M.D.   On: 06/03/2024 07:12   DG Chest Portable 1 View Result Date: 06/03/2024 CLINICAL DATA:  Chest pain EXAM: PORTABLE CHEST 1 VIEW COMPARISON:  12/17/2017 FINDINGS: The patient is rotated towards the right. Heart size appears normal. Opacity along the retrocardiac region may reflect a dilated descending thoracic aorta. There is also increased opacity along the expected location of the ascending aorta which may also reflect a dilated aorta versus rotational artifact. No pleural fluid scratch asymmetric elevation of the right hemidiaphragm. No pleural fluid, interstitial edema or airspace disease. Remote right posterior rib fractures. IMPRESSION: Opacity along the retrocardiac region may reflect a dilated descending thoracic aorta. There is also increased opacity along the expected location of the ascending aorta  which may also reflect a dilated aorta versus rotational artifact. Consider further evaluation with CT of the chest to exclude thoracic aortic aneurysm. Electronically Signed   By: Kimberley Penman M.D.   On: 06/03/2024 06:11    Disposition Pt is being discharged home today in good condition.  Follow-up Plans & Appointments  Discharge Instructions     Amb Referral to Cardiac Rehabilitation   Complete by: As directed    Diagnosis:  NSTEMI Coronary Stents     After initial evaluation and assessments completed: Virtual Based Care may be provided alone or in conjunction with Phase 2 Cardiac Rehab based on patient barriers.: Yes   Intensive Cardiac Rehabilitation (ICR) [facility] location only OR Traditional Cardiac Rehabilitation (TCR) *If criteria for ICR are not met will enroll in TCR [facility] only): Yes   Call MD for:  difficulty breathing, headache or visual disturbances   Complete by: As directed    Call MD for:  persistant dizziness or light-headedness   Complete by: As directed    Call MD for:  redness, tenderness, or signs of infection (pain, swelling, redness, odor or green/yellow discharge around incision site)   Complete by: As directed    Call MD for:  temperature >100.4   Complete by: As directed    Diet - low sodium heart healthy   Complete by: As directed    Discharge instructions   Complete by: As directed    Radial Site Care Refer to this sheet in the next few weeks. These instructions provide you with information on caring for yourself after your procedure. Your caregiver may also give you more specific instructions. Your treatment has been planned according to current medical practices, but problems sometimes occur. Call your caregiver if you have any problems or questions after your procedure. HOME CARE INSTRUCTIONS You may shower the day after the procedure. Remove the bandage (dressing) and gently wash the site with plain soap and water. Gently pat the site dry.  Do not apply  powder or lotion to the site.  Do not submerge the affected site in water for 3 to 5 days.  Inspect the site at least twice daily.  Do not flex or bend the affected arm for 24 hours.  No lifting over 5 pounds (2.3 kg) for 5 days after your procedure.  Do not drive home if you are discharged the  same day of the procedure. Have someone else drive you.  You may drive 24 hours after the procedure unless otherwise instructed by your caregiver.  What to expect: Any bruising will usually fade within 1 to 2 weeks.  Blood that collects in the tissue (hematoma) may be painful to the touch. It should usually decrease in size and tenderness within 1 to 2 weeks.  SEEK IMMEDIATE MEDICAL CARE IF: You have unusual pain at the radial site.  You have redness, warmth, swelling, or pain at the radial site.  You have drainage (other than a small amount of blood on the dressing).  You have chills.  You have a fever or persistent symptoms for more than 72 hours.  You have a fever and your symptoms suddenly get worse.  Your arm becomes pale, cool, tingly, or numb.  You have heavy bleeding from the site. Hold pressure on the site.   PLEASE DO NOT MISS ANY DOSES OF YOUR PLAVIX !!!!! Also keep a log of you blood pressures and bring back to your follow up appt. Please call the office with any questions.   Patients taking blood thinners should generally stay away from medicines like ibuprofen , Advil , Motrin , naproxen, and Aleve due to risk of stomach bleeding. You may take Tylenol  as directed or talk to your primary doctor about alternatives.  PLEASE ENSURE THAT YOU DO NOT RUN OUT OF YOUR PLAVIX . This medication is very important to remain on for at least one year. IF you have issues obtaining this medication due to cost please CALL the office 3-5 business days prior to running out in order to prevent missing doses of this medication.   You have been prescribed nitroglycerin  to take as needed for chest pain. If chest  pain occurs, you can take 1 table of nitroglycerin , place it under your tongue, and let it dissolve. Wait 5 minutes. If chest pain persists, you can take a second tablet. After another 5 minutes, if chest pain persists you can take a third tablet. However, if you are taking a third tablet of nitroglycerin , you should call EMS and be evaluated an an emergency department   Increase activity slowly   Complete by: As directed        Discharge Medications Allergies as of 06/04/2024       Reactions   Heparin  Other (See Comments)   Bruising/bleeding        Medication List     TAKE these medications    acetaminophen  500 MG tablet Commonly known as: TYLENOL  Take 500 mg by mouth every 6 (six) hours as needed.   aspirin  EC 81 MG tablet Take 1 tablet (81 mg total) by mouth daily. Swallow whole.   atorvastatin 40 MG tablet Commonly known as: LIPITOR Take 1 tablet (40 mg total) by mouth daily.   clopidogrel  75 MG tablet Commonly known as: PLAVIX  Take 1 tablet (75 mg total) by mouth daily with breakfast.   diphenhydrAMINE  25 mg capsule Commonly known as: BENADRYL  Take 25 mg by mouth every 6 (six) hours as needed for itching.   escitalopram  20 MG tablet Commonly known as: LEXAPRO  Take 1 tablet (20 mg total) by mouth daily.   levothyroxine  112 MCG tablet Commonly known as: SYNTHROID  TAKE 1 TABLET (112 MCG TOTAL) BY MOUTH DAILY BEFORE BREAKFAST. EXCEPT SKIP SUNDAY. 6 TABS A WEEK. What changed:  when to take this additional instructions   nitroGLYCERIN  0.4 MG SL tablet Commonly known as: NITROSTAT  Place 1 tablet (0.4 mg total) under  the tongue every 5 (five) minutes x 3 doses as needed for chest pain.   pramipexole  0.125 MG tablet Commonly known as: MIRAPEX  TAKE 1 TABLET BY MOUTH AT BEDTIME. TO REPLACE REQUIP    predniSONE  10 MG tablet Commonly known as: DELTASONE  Take 2 a day for 5 days, then 1 a day for 5 days, with food. Don't take with aleve/ibuprofen .          Outstanding Labs/Studies  Needs outpatient echo   Duration of Discharge Encounter: APP Time: 25 minutes   Signed, Debria Fang, PA-C 06/04/2024, 9:45 AM

## 2024-06-05 ENCOUNTER — Ambulatory Visit: Payer: Self-pay | Admitting: Family Medicine

## 2024-06-05 LAB — LIPOPROTEIN A (LPA): Lipoprotein (a): <8.4 nmol/L (ref <75.0)

## 2024-06-06 ENCOUNTER — Telehealth: Payer: Self-pay

## 2024-06-06 ENCOUNTER — Encounter (HOSPITAL_COMMUNITY): Payer: Self-pay | Admitting: Cardiology

## 2024-06-06 NOTE — Transitions of Care (Post Inpatient/ED Visit) (Signed)
   06/06/2024  Name: Hannah Pitts MRN: [id number] DOB: 02/26/1946  Today's TOC FU Call Status: Today's TOC FU Call Status:: Unsuccessful Call (1st Attempt) Unsuccessful Call (1st Attempt) Date: 06/06/24  Attempted to reach the patient regarding the most recent Inpatient/ED visit.  Follow Up Plan: Additional outreach attempts will be made to reach the patient to complete the Transitions of Care (Post Inpatient/ED visit) call.   Signature  Germain Kohler, CMA (AAMA)  [facility] [phone]

## 2024-06-09 ENCOUNTER — Encounter: Payer: Self-pay | Admitting: Family Medicine

## 2024-06-09 ENCOUNTER — Ambulatory Visit (INDEPENDENT_AMBULATORY_CARE_PROVIDER_SITE_OTHER): Admitting: Family Medicine

## 2024-06-09 VITALS — BP 118/58 | HR 71 | Temp 99.2°F | Ht 65.0 in

## 2024-06-09 DIAGNOSIS — I2510 Atherosclerotic heart disease of native coronary artery without angina pectoris: Secondary | ICD-10-CM | POA: Diagnosis not present

## 2024-06-09 LAB — CBC WITH DIFFERENTIAL/PLATELET
Absolute Lymphocytes: 996 {cells}/uL (ref 850–3900)
Absolute Monocytes: 320 {cells}/uL (ref 200–950)
Basophils Absolute: 19 {cells}/uL (ref 0–200)
Basophils Relative: 0.4 %
Eosinophils Absolute: 132 {cells}/uL (ref 15–500)
Eosinophils Relative: 2.8 %
HCT: 39.3 % (ref 35.0–45.0)
Hemoglobin: 12.6 g/dL (ref 11.7–15.5)
MCH: 29.4 pg (ref 27.0–33.0)
MCHC: 32.1 g/dL (ref 32.0–36.0)
MCV: 91.8 fL (ref 80.0–100.0)
MPV: 10.2 fL (ref 7.5–12.5)
Monocytes Relative: 6.8 %
Neutro Abs: 3234 {cells}/uL (ref 1500–7800)
Neutrophils Relative %: 68.8 %
Platelets: 200 10*3/uL (ref 140–400)
RBC: 4.28 10*6/uL (ref 3.80–5.10)
RDW: 13 % (ref 11.0–15.0)
Total Lymphocyte: 21.2 %
WBC: 4.7 10*3/uL (ref 3.8–10.8)

## 2024-06-09 LAB — BASIC METABOLIC PANEL WITH GFR
BUN/Creatinine Ratio: 20 (calc) (ref 6–22)
BUN: 31 mg/dL — ABNORMAL HIGH (ref 7–25)
CO2: 22 mmol/L (ref 20–32)
Calcium: 9.2 mg/dL (ref 8.6–10.4)
Chloride: 106 mmol/L (ref 98–110)
Creat: 1.53 mg/dL — ABNORMAL HIGH (ref 0.60–1.00)
Glucose, Bld: 94 mg/dL (ref 65–99)
Potassium: 4.4 mmol/L (ref 3.5–5.3)
Sodium: 142 mmol/L (ref 135–146)
eGFR: 34 mL/min/{1.73_m2} — ABNORMAL LOW (ref >=60)

## 2024-06-09 MED ORDER — NYSTATIN 100000 UNIT/GM EX POWD: 1.0000 | Freq: Two times a day (BID) | CUTANEOUS | 3 refills | Status: AC | PRN

## 2024-06-09 NOTE — Patient Instructions (Addendum)
 Go to the lab on the way out.   If you have mychart we'll likely use that to update you.     Recheck labs in about 2 months.  Fasting lab visit.    Please ask cardiology about seeing up the follow up echo.   Take care.  Glad to see you.

## 2024-06-09 NOTE — Progress Notes (Signed)
 Inpatient f/u.    Needed refill on nystatin  powder.  Previously used as needed.  Refill done at office visit.  Admit date: 06/03/2024 Discharge date: 06/04/2024   PCP:  Donnie Galea, MD              [facility] Providers Cardiologist:  None   - Planning to establish with Dr. Gollan in [facility]  Discharge Diagnoses  Principal Problem:   NSTEMI (non-ST elevated myocardial infarction) [facility])     Diagnostic Studies/Procedures    Left Heart Catheterization 06/03/24    Prox RCA to Mid RCA lesion is 85% stenosed.   Mid Cx to Dist Cx lesion is 90% stenosed.   Ost LAD to Mid LAD lesion is 15% stenosed.   A drug-eluting stent was successfully placed using a STENT SYNERGY XD 2.75X12.   A drug-eluting stent was successfully placed using a STENT SYNERGY XD 2.50X20.   Post intervention, there is a 0% residual stenosis.   Post intervention, there is a 0% residual stenosis.   The left ventricular systolic function is normal.   LV end diastolic pressure is normal.   The left ventricular ejection fraction is 55-65% by visual estimate.   Recommend uninterrupted dual antiplatelet therapy with Aspirin  81mg  daily and Clopidogrel  75mg  daily for a minimum of 12 months (ACS-Class I recommendation).   Severe 2 vessel obstructive CAD Normal LV function Normal LVEDP Successful PCI of the mid LCx with DES x1 Successful PCI of the mid RCA with DES x 1   Plan: DAPT for one year. Anticipate DC in am.  _____________    History of Present Illness   Hannah Pitts is a 78 y.o. female with  a past history of DVT/PE s/p IVC filter in 2018, pericarditis, orthostatic syncope, symptomatic PACs, aortic atherosclerosis, lymphoma, hypothyroidism, hepatic steatosis, GERD, chronic back pain, lupus.    Previously had nuclear stress testing completed in 10/2015 that showed normal myocardial perfusion, EF 59%.  She had a PE and bilateral DVTs in 2018.  Was started on Xarelto  but developed a left rectus  abdominis hematoma and had an IVC filter placed.  Echocardiogram in 2018 showed EF 60-65%, no regional wall motion abnormalities.    She had been seen by her PCP in 11/2023 for follow-up of back pain, reportedly had also been having episodes of blacking out.  She wore a cardiac monitor in 11/2023 that showed predominantly normal sinus rhythm with average heart rate 62 bpm, 1 run of NSVT, 13 episodes of SVT lasting up to 18 beats, rare PACs and PVCs.  She was seen by cardiology in 01/2024 for evaluation of syncope.  Orthostatic vital signs were positive in clinic.  Carotid ultrasound in 03/2024 showed near normal carotid arteries.  Echocardiogram in 03/2024 showed EF 55-60% with regional wall motion abnormalities, normal RV systolic function, no significant valvular abnormalities.  Patient was encouraged to increase hydration, use compression socks.    She presented to the ED 6/6 complaining of chest pain.  Chest pain started around 3 AM while she was watching TV.  Was located in the middle of her chest and radiated down her left arm.  Associated with shortness of breath, diaphoresis.  She called EMS.  After receiving sublingual nitroglycerin  and morphine , her symptoms improved but did not completely resolved.  Denies nausea, vomiting.  She is very cautious about blood thinners.  Reports that when she had her PE in 2018, she had significant bleeding from her IV sites when on blood thinners.  She denies GI bleeding or intracranial bleeding.  Reports that most of the bleeding was from her skin.  After long discussion, she was willing to be started on heparin  and undergo cath   In the ED, initial vital signs showed heart rate 70 bpm, blood pressure 138/80, oxygen  98% on room air.  Labs in the ED significant for creatinine 1.28, potassium 4.5, WBC 7.0, hemoglobin 12.7, platelets 148.  High-sensitivity troponin 315> 20,444.  CTA chest showed no evidence of PE, cardiomegaly, coronary artery disease.  There was  enlargement of the main pulmonary artery as can be seen in pulmonary hypertension.  She was admitted to the cardiology service for treatment of NSTEMI      Hospital Course     NSTEMI  - Patient presented with chest pain that started suddenly while she was at rest.  High-sensitivity troponin 315> 20,444 - CTA chest showed no evidence of PE.  Did note cardiomegaly, coronary artery disease - EKG without acute ST changes, did show old inferior infarct - Underwent cardiac catheterization on 06/03/2024 that showed severe two-vessel obstructive CAD.  Underwent successful PCI of the mid left circumflex with DES x 1 and successful PCI of the mid RCA with DES x 1.  On cath there was normal LV function, normal LVEDP with EF 55-65% by visual estimate - Recommending DAPT for 1 year with aspirin  81 mg daily, Plavix  75 mg daily - Continue Lipitor 40 mg daily - Patient reported feeling well on 6/7 it was without chest pain.  She was seen and evaluated by Dr. Marven Slimmer, deemed stable for discharge -Needs outpatient echo.  Per Dr. Marven Slimmer, patient was not having symptoms of heart failure and her EF was normal on cath.  Echo did not need to be completed as an inpatient - Right ulnar cath site was soft, nontender prior to DC. 2+ ulnar pulse present. There was some bruising noted, but area was soft and nontender. Discussed cath site care and provided written instructions on AVS  - Arranged outpatient follow up with Varney Gentleman PA-C on 6/13    Hypothyroidism  - Continue home dose synthroid  112 mcg tablet    Depression  - Continue home lexapro     HLD  - Lipid panel this admission showed LDL 115, HDL 34, triglycerides 202 - Lipitor 40 mg daily start this admission  - Repeat lipid panel, LFTs in 8 weeks    ================ Patient had new onset of chest pain requiring emergency room evaluation and admission for NSTEMI.  Cath results and rationale for current medications with aspirin /Plavix /atorvastatin  discussed with  patient.  She clearly feels better in the meantime.  She has some resolving bruising R arm as expected at the cath site.  No other bleeding.  She clearly feels better than her admission status.  She is tolerated atorvastatin  in the meantime.  Meds, vitals, and allergies reviewed.   ROS: Per HPI unless specifically indicated in ROS section   GEN: nad, alert and oriented HEENT: ncat NECK: supple w/o LA CV: rrr. PULM: ctab, no inc wob ABD: soft, +bs EXT: no edema SKIN: no acute rash but bruising on the right arm, site of previous catheterization.  35 minutes were devoted to patient care in this encounter (this includes time spent reviewing the patient's file/history, interviewing and examining the patient, counseling/reviewing plan with patient).

## 2024-06-09 NOTE — Progress Notes (Signed)
 Cardiology Office Note    Date:  06/10/2024   ID:  Hannah Pitts, DOB 1946/10/01, MRN [id number]  PCP:  Donnie Galea, MD  Cardiologist:  None  Electrophysiologist:  None   Chief Complaint: Hospital follow up  History of Present Illness:   Hannah Pitts is a 78 y.o. female with history of CAD with NSTEMI in 05/2024 status post PCI/DES to the LCx and RCA, Sjogren's and lupus, bilateral DVTs and PE in 2018 complicated by left rectus abdominis hematoma while on rivaroxaban  status post IVC filter, pericarditis, orthostatic syncope, symptomatic PACs, aortic atherosclerosis, lymphoma, hypothyroidism, hepatic steatosis, Mnire's disease, GERD, and chronic back pain who presents for hospital follow-up as outlined below.  She was previously followed by Drs. Ardell Koller and Allred.  She was evaluated by Dr. Theodis Fiscal in 2016 for abnormal EKG with concern for atrial flutter, with changes felt to be related to her resting tremor and not atrial flutter.  She also reported shortness of breath with echo in 10/2015 showed an EF of 60 to 65%, mild LVH, no regional wall motion normalities, grade 1 diastolic dysfunction, mild aortic insufficiency, trivial mitral regurgitation, and normal RV systolic function and ventricular cavity size.  Lexiscan  MPI in 10/2015 showed no significant ischemia or scar and was overall low risk with an EF of 55 to 65%.  Echo in 11/2017, during admission for showed an EF of 60 to 65%, no regional wall motion abnormalities, mild LVH, grade 1 diastolic dysfunction, trivial mitral regurgitation, normal RV systolic function.   She was seen at her PCP's office in 11/2023 for for follow-up of back pain.  Note indicates by the way, I have been blacking out.  She reported she could feel a trembling in her heart.  Duration was unclear with most recent event felt to be months prior.  She was without symptoms of angina or cardiac decompensation.  This was the first time her PCP had been made  aware of these episodes.  Labs were notable for mild renal dysfunction.  Zio patch in 11/2023 showed a predominant rhythm of sinus with an average rate of 62 bpm (range 45 to 158 bpm), 1 run of NSVT lasting 4 beats, 13 episodes of SVT lasting up to 18 beats, and rare supraventricular and ventricular ectopy.  No sustained arrhythmias, prolonged pauses, or evidence of high-grade AV block.   She was evaluated by cardiology on 02/22/2024 and reported an episode of syncope that occurred 3 to 4 months prior with associated prodrome of near syncope.  She was able to get to a chair and subsequently came to on the floor.  Episode occurred in the setting of increased low back pain.  No associated chest pain, palpitations, dyspnea, flushing, or loss of bowel/bladder function.  In total, her family member reported the patient may have had upwards of 4-5 syncopal episodes over the past 10 years.  Orthostatic vital signs were positive in the office.  Carotid artery ultrasound in 03/2024 showed near normal bilateral ICAs with antegrade flow of the bilateral vertebral arteries and disturbed flow in the right subclavian artery.  Echo in 03/2024 showed an EF of 55 to 60%, basal inferior and septal wall hypokinesis, grade 1 diastolic dysfunction, normal RV systolic function and ventricular cavity size, mild aortic insufficiency, and an estimated right atrial pressure of 3 mmHg.  Syncopal episodes were consistent with orthostatic syncope with recommendation to increase fluids and wear compression socks.  She was admitted to [facility] from 6/6 through 06/04/2024  with NSTEMI.  High-sensitivity troponin trended to 20,000.  CTA of the chest negative for PE.  EKG without acute ST-T changes with prior inferior infarct.  She underwent LHC which showed severe two-vessel CAD status post PCI/DES to the mid LCx and mid RCA.  Normal LV systolic function and LVEDP.  She comes in doing well from a cardiac perspective and is without symptoms of  angina or cardiac decompensation.  No further chest pain.  No dyspnea, dizziness, presyncope, or syncope.  Her functional status is somewhat improved following PCI as outlined above.  No falls, hematochezia, or melena.  Bruising along the right ulnar arteriotomy site is improving.  Drinking 64 ounces of water daily.  Has not missed any doses of DAPT.  Overall feels well and does not have any acute cardiac concerns at this time.   Labs independently reviewed: 05/2024 - Hgb 12.6, PLT 200, BUN 31, serum creatinine 1.53, potassium 4.4, TC 189, TG 202, HDL 34, LDL 115, LP(a) less than 8.4 04/2024 -TSH 0.33, albumin 4.2, AST/ALT normal   Past Medical History:  Diagnosis Date   Abnormal involuntary movements(781.0)    Anemia of other chronic disease    Depressive disorder, not elsewhere classified    Diaphragmatic hernia without mention of obstruction or gangrene    Dysthymic disorder    Generalized hyperhidrosis    GERD (gastroesophageal reflux disease)    H/O hiatal hernia    History of pulmonary embolus (PE)    Lymphoma (HCC) 03/2010   they got it all w/surgery   Meniere's disease of left ear    Migraine    used to have classic kind; not anymore; last one was in 2012   Other rheumatoid arthritis with visceral or systemic involvement    Palpitations    Personal history of other disorders of nervous system and sense organs    Systemic lupus erythematosus (HCC)    Unspecified disease of pericardium    Pericarditis    Unspecified hypothyroidism    Urticaria, unspecified     Past Surgical History:  Procedure Laterality Date   ABDOMINAL HYSTERECTOMY  1978   APPENDECTOMY  1974   BREAST BIOPSY  03/2010   left breast;    BREAST LUMPECTOMY  03/2010   left breast; nonHodgkins lymphoma; not breast cancer   CHOLECYSTECTOMY  1974   CORONARY STENT INTERVENTION N/A 06/03/2024   Procedure: CORONARY STENT INTERVENTION;  Surgeon: Swaziland, Peter M, MD;  Location: [facility] INVASIVE CV LAB;  Service:  Cardiovascular;  Laterality: N/A;   ESOPHAGEAL DILATION  ?02/2011   ESOPHAGOGASTRODUODENOSCOPY  01-24-11   Slight stenosis esoph spasm   IR IVC FILTER PLMT / S&I /IMG GUID/MOD SED  12/17/2017   LEFT HEART CATH AND CORONARY ANGIOGRAPHY N/A 06/03/2024   Procedure: LEFT HEART CATH AND CORONARY ANGIOGRAPHY;  Surgeon: Swaziland, Peter M, MD;  Location: [facility] INVASIVE CV LAB;  Service: Cardiovascular;  Laterality: N/A;   Left VATS  12/1997   LUMBAR DISC SURGERY  1977   shunt     put in behind left ear ; Menier's syndrome    Current Medications: Current Meds  Medication Sig   acetaminophen  (TYLENOL ) 500 MG tablet Take 500 mg by mouth every 6 (six) hours as needed.   aspirin  EC 81 MG tablet Take 1 tablet (81 mg total) by mouth daily. Swallow whole.   atorvastatin  (LIPITOR) 40 MG tablet Take 1 tablet (40 mg total) by mouth daily.   clopidogrel  (PLAVIX ) 75 MG tablet Take 1 tablet (75 mg total)  by mouth daily with breakfast.   diphenhydrAMINE  HCl 12.5 MG TBDP Take 12.5-25 mg by mouth every 6 (six) hours as needed for itching.   escitalopram  (LEXAPRO ) 20 MG tablet Take 1 tablet (20 mg total) by mouth daily.   levothyroxine  (SYNTHROID ) 112 MCG tablet TAKE 1 TABLET (112 MCG TOTAL) BY MOUTH DAILY BEFORE BREAKFAST. EXCEPT SKIP SUNDAY. 6 TABS A WEEK.   nitroGLYCERIN  (NITROSTAT ) 0.4 MG SL tablet Place 1 tablet (0.4 mg total) under the tongue every 5 (five) minutes x 3 doses as needed for chest pain.   nystatin  (MYCOSTATIN /NYSTOP ) powder Apply 1 Application topically 2 (two) times daily as needed.   pramipexole  (MIRAPEX ) 0.125 MG tablet TAKE 1 TABLET BY MOUTH AT BEDTIME. TO REPLACE REQUIP     Allergies:   Heparin    Social History   Socioeconomic History   Marital status: Widowed    Spouse name: Not on file   Number of children: 2   Years of education: Not on file   Highest education level: Not on file  Occupational History   Occupation: Disabled     Employer: RETIRED  Tobacco Use   Smoking status: Never    Smokeless tobacco: Never  Vaping Use   Vaping status: Never Used  Substance and Sexual Activity   Alcohol  use: No   Drug use: No   Sexual activity: Never  Other Topics Concern   Not on file  Social History Narrative   Ms. Krahn lives in [place], [place].    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    Social Drivers of Corporate investment banker Strain: Low Risk  (04/07/2024)   Overall Financial Resource Strain (CARDIA)    Difficulty of Paying Living Expenses: Not hard at all  Food Insecurity: No Food Insecurity (06/04/2024)   Hunger Vital Sign    Worried About Running Out of Food in the Last Year: Never true    Ran Out of Food in the Last Year: Never true  Transportation Needs: No Transportation Needs (06/04/2024)   PRAPARE - Administrator, Civil Service (Medical): No    Lack of Transportation (Non-Medical): No  Physical Activity: Insufficiently Active (04/07/2024)   Exercise Vital Sign    Days of Exercise per Week: 3 days    Minutes of Exercise per Session: 30 min  Stress: No Stress Concern Present (04/07/2024)   [employer] of Occupational Health - Occupational Stress Questionnaire    Feeling of Stress : Not at all  Social Connections: Moderately Isolated (06/04/2024)   Social Connection and Isolation Panel    Frequency of Communication with Friends and Family: Twice a week    Frequency of Social Gatherings with Friends and Family: Once a week    Attends Religious Services: 1 to 4 times per year    Active Member of [employer] or Organizations: No    Attends Banker Meetings: Never    Marital Status: Widowed     Family History:  The patient's family history includes Breast cancer in her maternal aunt; COPD in her sister; Cancer in her sister, sister, and sister; Coronary artery  disease in her brother, sister, sister, and sister; Diabetes in her brother; Heart failure in her brother, mother, and sister; Stroke in her father. There is no history of Colon cancer.  ROS:  12-point review of systems is negative unless otherwise noted in the HPI.   EKGs/Labs/Other Studies Reviewed:    Studies reviewed were summarized above. The additional studies were reviewed today:  LHC 06/03/2024:   Prox RCA to Mid RCA lesion is 85% stenosed.   Mid Cx to Dist Cx lesion is 90% stenosed.   Ost LAD to Mid LAD lesion is 15% stenosed.   A drug-eluting stent was successfully placed using a STENT SYNERGY XD 2.75X12.   A drug-eluting stent was successfully placed using a STENT SYNERGY XD 2.50X20.   Post intervention, there is a 0% residual stenosis.   Post intervention, there is a 0% residual stenosis.   The left ventricular systolic function is normal.   LV end diastolic pressure is normal.   The left ventricular ejection fraction is 55-65% by visual estimate.   Recommend uninterrupted dual antiplatelet therapy with Aspirin  81mg  daily and Clopidogrel  75mg  daily for a minimum of 12 months (ACS-Class I recommendation).   Severe 2 vessel obstructive CAD Normal LV function Normal LVEDP Successful PCI of the mid LCx with DES x1 Successful PCI of the mid RCA with DES x 1   Plan: DAPT for one year. Anticipate DC in am. __________  2D echo 04/14/2024: 1. Left ventricular ejection fraction, by estimation, is 55 to 60%. Left  ventricular ejection fraction by 3D volume is 70 %. The left ventricle has  normal function. The left ventricle demonstrates regional wall motion  abnormalities (basal inferior and  septal wall hypokinesis). Left ventricular diastolic parameters are  consistent with Grade I diastolic dysfunction (impaired relaxation).   2. Right ventricular systolic function is normal. The right ventricular  size is normal.   3. The mitral valve is normal in structure. No evidence of  mitral valve  regurgitation. No evidence of mitral stenosis.   4. The aortic valve is normal in structure. Aortic valve regurgitation is  mild. No aortic stenosis is present.   5. The inferior vena cava is normal in size with greater than 50%  respiratory variability, suggesting right atrial pressure of 3 mmHg.  __________   Carotid artery ultrasound 04/14/2024: Right Carotid: The extracranial vessels were near-normal with only minimal  wall thickening or plaque.   Left Carotid: The extracranial vessels were near-normal with only minimal  wall thickening or plaque.   Vertebrals:  Bilateral vertebral arteries demonstrate antegrade flow.  Subclavians: Right subclavian artery flow was disturbed. Normal flow hemodynamics were seen in the left subclavian artery.  __________   Zio patch 11/2023: HR 45 - 158, average 62 bpm. 1 nonsustained VT lasting 4 beats. 13 nonsustained SVT, longest 18 beats. Rare supraventricular and ventricular ectopy. No atrial fibrillation. No sustained arrhythmias. __________   2D echo 12/13/2017: - Left ventricle: The cavity size was normal. Wall thickness was    increased in a pattern of mild LVH. Systolic function was normal.    The estimated ejection fraction was in the range of 60% to 65%.    Wall motion was normal; there were no regional wall motion    abnormalities. Doppler parameters are consistent with abnormal    left ventricular relaxation (grade 1 diastolic dysfunction).  - Aortic valve: Mildly calcified annulus. Trileaflet.  - Mitral valve: There was trivial regurgitation.  - Right ventricle: Systolic function was normal.  - Tricuspid valve: There was trivial regurgitation.  - Pulmonary arteries: Systolic pressure could not be accurately    estimated.  - Pericardium, extracardiac: A prominent pericardial fat pad was  present.   Impressions:   - Images are limited. Mild LVH with LVEF 60-65% and grade 1    diastolic dysfunction. Mildly  calcified aortic annulus. Trivial    mitral and tricuspid regurgitation. Grossly normal right    ventricular contraction. Prominent pericardial fat-pad.  __________   2D echo 11/27/2015: - Left ventricle: The cavity size was normal. There was mild    concentric hypertrophy. Systolic function was normal. The    estimated ejection fraction was in the range of 60% to 65%. Wall    motion was normal; there were no regional wall motion    abnormalities. Doppler parameters are consistent with abnormal    left ventricular relaxation (grade 1 diastolic dysfunction).  - Aortic valve: There was mild regurgitation.  - Mitral valve: There was trivial regurgitation.  - Right ventricle: The cavity size was normal. Wall thickness was    normal. Systolic function was normal.  __________   Lexiscan  MPI 11/20/2015: Nuclear stress EF: 59%. The left ventricular ejection fraction is normal (55-65%). There was no ST segment deviation noted during stress. The study is normal. This is a low risk study. Normal myocardial perfusion without scar or ischemia.   Normal lexiscan  nuclear stress test demonstrating normal myocardial perfusion and function: EF 59%. __________   2D echo 08/04/2013: - Left ventricle: The cavity size was normal. Wall thickness    was increased in a pattern of mild LVH. Systolic function    was normal. The estimated ejection fraction was in the    range of 55% to 65%. Wall motion was normal; there were no    regional wall motion abnormalities. Doppler parameters are    consistent with abnormal left ventricular relaxation    (grade 1 diastolic dysfunction).  - Aortic valve: Mild regurgitation.  - Mitral valve: Mild regurgitation.  - Atrial septum: No defect or patent foramen ovale was    identified.  ___________   2D echo 04/21/2012: - Left ventricle: The cavity size was normal. Wall thickness    was normal. Systolic function was normal. The estimated    ejection fraction was in  the range of 60% to 65%. Wall    motion was normal; there were no regional wall motion    abnormalities. Doppler parameters are consistent with    abnormal left ventricular relaxation (grade 1 diastolic    dysfunction).  - Aortic valve: Mild regurgitation.    EKG:  EKG is ordered today.  The EKG ordered today demonstrates NSR, 67 bpm, left axis deviation, poor R wave progression along the precordial leads, nonspecific anterolateral ST-T changes, prior inferior infarct  Recent Labs: 05/20/2024: ALT 27; TSH 0.33 06/09/2024: BUN 31; Creat 1.53; Hemoglobin 12.6; Platelets 200; Potassium 4.4; Sodium 142  Recent Lipid Panel    Component Value Date/Time   CHOL 189 06/04/2024 0216   TRIG 202 (H) 06/04/2024 0216   HDL 34 (L) 06/04/2024 0216   CHOLHDL 5.6 06/04/2024 0216   VLDL 40 06/04/2024 0216   LDLCALC 115 (H) 06/04/2024 0216   LDLCALC 112 (H) 05/20/2024 1623    PHYSICAL EXAM:    VS:  BP 120/70 (BP Location: Left Arm, Patient Position: Sitting, Cuff Size: Normal)   Pulse 67   Ht 5' 5 (1.651 m)   Wt 207 lb (93.9 kg)   SpO2 96%   BMI 34.45 kg/m   BMI: Body mass index is 34.45 kg/m.  Physical Exam Vitals reviewed.  Constitutional:      Appearance: She is well-developed.  HENT:  Head: Normocephalic and atraumatic.   Eyes:     General:        Right eye: No discharge.        Left eye: No discharge.   Neck:     Vascular: No JVD.   Cardiovascular:     Rate and Rhythm: Normal rate and regular rhythm.     Heart sounds: Normal heart sounds, S1 normal and S2 normal. Heart sounds not distant. No midsystolic click and no opening snap. No murmur heard.    No friction rub.     Comments: Ecchymosis along the ulnar aspect of the right forearm extending proximal towards the elbow.  Ecchymosis is soft and nontender to palpation.  Radial and ulnar pulse 2+ along the right upper extremity.  No active bleeding, swelling, warmth, or erythema. Pulmonary:     Effort: Pulmonary effort is  normal. No respiratory distress.     Breath sounds: Normal breath sounds. No decreased breath sounds, wheezing, rhonchi or rales.  Chest:     Chest wall: No tenderness.   Musculoskeletal:     Cervical back: Normal range of motion.     Right lower leg: No edema.     Left lower leg: No edema.   Skin:    General: Skin is warm and dry.     Nails: There is no clubbing.   Neurological:     Mental Status: She is alert and oriented to person, place, and time.   Psychiatric:        Speech: Speech normal.        Behavior: Behavior normal.        Thought Content: Thought content normal.        Judgment: Judgment normal.     Wt Readings from Last 3 Encounters:  06/10/24 207 lb (93.9 kg)  06/03/24 207 lb 3.7 oz (94 kg)  05/20/24 208 lb (94.3 kg)     ASSESSMENT & PLAN:   CAD involving native coronary arteries with recent NSTEMI: Without symptoms of angina or cardiac decompensation.  Continue DAPT with aspirin  81 mg and clopidogrel  75 mg daily for a minimum of 1 year without interruption from date of PCI (06/03/2024).  Repeat echo to evaluate for new cardiomyopathy.  She will otherwise continue recently started atorvastatin  as outlined below.  Has as needed SL NTG.  Cardiac rehab.  Post-cath instructions.  No indication for further ischemic testing at this time.  Near syncope/syncope: Consistent with orthostasis.  No further episodes.  Reassuring Zio patch, carotid artery ultrasound, and echo.  Continue to maintain adequate hydration.  Aortic atherosclerosis/HLD: LDL 115 with target LDL less than 55.  Now on atorvastatin  40 mg.  Anticipate follow-up fasting lipid panel and LFT in early 07/2024.  Escalate lipid-lowering therapy as indicated to achieve target LDL.  History of bilateral DVTs and PE: Status post IVC filter in 2018.  Renal dysfunction: Largely stable.  Not on cardiac nephrotoxic medications.  Ongoing management per PCP.     Disposition: F/u with Dr. Gollan or an APP in 2  months.   Medication Adjustments/Labs and Tests Ordered: Current medicines are reviewed at length with the patient today.  Concerns regarding medicines are outlined above. Medication changes, Labs and Tests ordered today are summarized above and listed in the Patient Instructions accessible in Encounters.   Signed, Varney Gentleman, PA-C 06/10/2024 9:04 AM     [facility] [place] [address] [place], [place] [address] [phone]

## 2024-06-10 ENCOUNTER — Encounter: Payer: Self-pay | Admitting: Physician Assistant

## 2024-06-10 ENCOUNTER — Ambulatory Visit: Attending: Physician Assistant | Admitting: Physician Assistant

## 2024-06-10 VITALS — BP 120/70 | HR 67 | Ht 65.0 in | Wt 207.0 lb

## 2024-06-10 DIAGNOSIS — I2510 Atherosclerotic heart disease of native coronary artery without angina pectoris: Secondary | ICD-10-CM

## 2024-06-10 DIAGNOSIS — I700 Atherosclerosis of aorta: Secondary | ICD-10-CM | POA: Diagnosis not present

## 2024-06-10 DIAGNOSIS — E785 Hyperlipidemia, unspecified: Secondary | ICD-10-CM | POA: Diagnosis not present

## 2024-06-10 DIAGNOSIS — I951 Orthostatic hypotension: Secondary | ICD-10-CM

## 2024-06-10 DIAGNOSIS — Z86711 Personal history of pulmonary embolism: Secondary | ICD-10-CM

## 2024-06-10 DIAGNOSIS — I214 Non-ST elevation (NSTEMI) myocardial infarction: Secondary | ICD-10-CM

## 2024-06-10 DIAGNOSIS — N289 Disorder of kidney and ureter, unspecified: Secondary | ICD-10-CM | POA: Diagnosis not present

## 2024-06-10 DIAGNOSIS — R55 Syncope and collapse: Secondary | ICD-10-CM | POA: Diagnosis not present

## 2024-06-10 DIAGNOSIS — Z86718 Personal history of other venous thrombosis and embolism: Secondary | ICD-10-CM | POA: Diagnosis not present

## 2024-06-10 NOTE — Patient Instructions (Signed)
 Medication Instructions:  Your physician recommends that you continue on your current medications as directed. Please refer to the Current Medication list given to you today.   *If you need a refill on your cardiac medications before your next appointment, please call your pharmacy*  Lab Work: None ordered at this time  If you have labs (blood work) drawn today and your tests are completely normal, you will receive your results only by: MyChart Message (if you have MyChart) OR A paper copy in the mail If you have any lab test that is abnormal or we need to change your treatment, we will call you to review the results.  Testing/Procedures: Your physician has requested that you have an echocardiogram. Echocardiography is a painless test that uses sound waves to create images of your heart. It provides your doctor with information about the size and shape of your heart and how well your heart's chambers and valves are working.   You may receive an ultrasound enhancing agent through an IV if needed to better visualize your heart during the echo. This procedure takes approximately one hour.  There are no restrictions for this procedure.  This will take place at [address] [facility]) [address], [place] [address]  Please note: We ask at that you not bring children with you during ultrasound (echo/ vascular) testing. Due to room size and safety concerns, children are not allowed in the ultrasound rooms during exams. Our front office staff cannot provide observation of children in our lobby area while testing is being conducted. An adult accompanying a patient to their appointment will only be allowed in the ultrasound room at the discretion of the ultrasound technician under special circumstances. We apologize for any inconvenience.   Follow-Up: At [facility], you and your health needs are our priority.  As part of our continuing mission to provide you with exceptional  heart care, our providers are all part of one team.  This team includes your primary Cardiologist (physician) and Advanced Practice Providers or APPs (Physician Assistants and Nurse Practitioners) who all work together to provide you with the care you need, when you need it.  Your next appointment:   Is in August with Hannah Pitts   We recommend signing up for the patient portal called MyChart.  Sign up information is provided on this After Visit Summary.  MyChart is used to connect with patients for Virtual Visits (Telemedicine).  Patients are able to view lab/test results, encounter notes, upcoming appointments, etc.  Non-urgent messages can be sent to your provider as well.   To learn more about what you can do with MyChart, go to [email or web address].

## 2024-06-11 DIAGNOSIS — I2510 Atherosclerotic heart disease of native coronary artery without angina pectoris: Secondary | ICD-10-CM | POA: Insufficient documentation

## 2024-06-11 NOTE — Assessment & Plan Note (Signed)
 Continue aspirin  Plavix  and statin.  Not having chest pain.  Okay for outpatient follow-up.  Has cardiology follow-up pending.  Discussed with patient about getting interval labs today, rechecking fasting labs in 2 months, and her checking with cardiology about follow-up echo.

## 2024-06-12 ENCOUNTER — Ambulatory Visit: Payer: Self-pay | Admitting: Family Medicine

## 2024-06-12 ENCOUNTER — Other Ambulatory Visit: Payer: Self-pay | Admitting: Family Medicine

## 2024-06-12 DIAGNOSIS — I2510 Atherosclerotic heart disease of native coronary artery without angina pectoris: Secondary | ICD-10-CM

## 2024-06-14 ENCOUNTER — Ambulatory Visit

## 2024-06-17 ENCOUNTER — Other Ambulatory Visit

## 2024-06-24 ENCOUNTER — Ambulatory Visit: Attending: Physician Assistant

## 2024-06-24 ENCOUNTER — Other Ambulatory Visit (INDEPENDENT_AMBULATORY_CARE_PROVIDER_SITE_OTHER)

## 2024-06-24 ENCOUNTER — Ambulatory Visit (INDEPENDENT_AMBULATORY_CARE_PROVIDER_SITE_OTHER)

## 2024-06-24 ENCOUNTER — Telehealth: Payer: Self-pay | Admitting: Emergency Medicine

## 2024-06-24 ENCOUNTER — Other Ambulatory Visit: Payer: Self-pay | Admitting: Physician Assistant

## 2024-06-24 ENCOUNTER — Ambulatory Visit: Payer: Self-pay | Admitting: Physician Assistant

## 2024-06-24 DIAGNOSIS — N289 Disorder of kidney and ureter, unspecified: Secondary | ICD-10-CM

## 2024-06-24 DIAGNOSIS — I700 Atherosclerosis of aorta: Secondary | ICD-10-CM

## 2024-06-24 DIAGNOSIS — Z86718 Personal history of other venous thrombosis and embolism: Secondary | ICD-10-CM

## 2024-06-24 DIAGNOSIS — I4892 Unspecified atrial flutter: Secondary | ICD-10-CM

## 2024-06-24 DIAGNOSIS — I2510 Atherosclerotic heart disease of native coronary artery without angina pectoris: Secondary | ICD-10-CM

## 2024-06-24 DIAGNOSIS — I214 Non-ST elevation (NSTEMI) myocardial infarction: Secondary | ICD-10-CM

## 2024-06-24 DIAGNOSIS — Z86711 Personal history of pulmonary embolism: Secondary | ICD-10-CM

## 2024-06-24 DIAGNOSIS — I951 Orthostatic hypotension: Secondary | ICD-10-CM

## 2024-06-24 DIAGNOSIS — E785 Hyperlipidemia, unspecified: Secondary | ICD-10-CM

## 2024-06-24 DIAGNOSIS — R55 Syncope and collapse: Secondary | ICD-10-CM

## 2024-06-24 DIAGNOSIS — Z79899 Other long term (current) drug therapy: Secondary | ICD-10-CM

## 2024-06-24 LAB — BASIC METABOLIC PANEL WITH GFR
BUN: 33 mg/dL — ABNORMAL HIGH (ref 6–23)
CO2: 25 meq/L (ref 19–32)
Calcium: 9.9 mg/dL (ref 8.4–10.5)
Chloride: 107 meq/L (ref 96–112)
Creatinine, Ser: 1.3 mg/dL — ABNORMAL HIGH (ref 0.40–1.20)
GFR: 39.15 mL/min — ABNORMAL LOW (ref >60.00)
Glucose, Bld: 116 mg/dL — ABNORMAL HIGH (ref 70–99)
Potassium: 4.2 meq/L (ref 3.5–5.1)
Sodium: 142 meq/L (ref 135–145)

## 2024-06-24 LAB — ECHOCARDIOGRAM LIMITED
AV Mean grad: 5 mmHg
AV Peak grad: 9.7 mmHg
Ao pk vel: 1.56 m/s
Area-P 1/2: 3.31 cm2

## 2024-06-24 MED ORDER — APIXABAN 5 MG PO TABS: 5.0000 mg | ORAL_TABLET | Freq: Two times a day (BID) | ORAL | Status: DC

## 2024-06-24 MED ORDER — APIXABAN 5 MG PO TABS: 5.0000 mg | ORAL_TABLET | Freq: Two times a day (BID) | ORAL | 3 refills | Status: DC

## 2024-06-24 NOTE — Telephone Encounter (Signed)
 Called and spoke with patient and daughter. Notified them of the following from [employer], PA-C.   Echo showed normal pumping function, low normal right-sided pump function, mildly leaky mitral valve, and normal pressure in the upper right chamber of the heart.  Rhythm strip during the study showed a newly diagnosed atrial flutter.   Recommendations: -Discussed with interventional cardiology (given her recent PCI and in the context of the patient's prior rectus sheath hematoma in 2018 on rivaroxaban  for DVT/PE) -Stop aspirin  -Start Eliquis 5 mg twice daily (she does not meet reduced dosing criteria) -Continue Plavix  75 mg daily -Order Zio patch to quantify atrial flutter burden -CBC and BMP 1 week after starting apixaban  Daughter verbalizes understanding. Ordered placed for medication changes, labs and zio.

## 2024-06-26 ENCOUNTER — Ambulatory Visit: Payer: Self-pay | Admitting: Family Medicine

## 2024-07-04 DIAGNOSIS — Z79899 Other long term (current) drug therapy: Secondary | ICD-10-CM | POA: Diagnosis not present

## 2024-07-05 ENCOUNTER — Ambulatory Visit: Payer: Self-pay | Admitting: Physician Assistant

## 2024-07-05 LAB — BASIC METABOLIC PANEL WITH GFR
BUN/Creatinine Ratio: 29 — ABNORMAL HIGH (ref 12–28)
BUN: 35 mg/dL — ABNORMAL HIGH (ref 8–27)
CO2: 19 mmol/L — ABNORMAL LOW (ref 20–29)
Calcium: 9.4 mg/dL (ref 8.7–10.3)
Chloride: 107 mmol/L — ABNORMAL HIGH (ref 96–106)
Creatinine, Ser: 1.22 mg/dL — ABNORMAL HIGH (ref 0.57–1.00)
Glucose: 111 mg/dL — ABNORMAL HIGH (ref 70–99)
Potassium: 4.8 mmol/L (ref 3.5–5.2)
Sodium: 142 mmol/L (ref 134–144)
eGFR: 45 mL/min/1.73 — ABNORMAL LOW (ref >59)

## 2024-07-05 LAB — CBC
Hematocrit: 35.9 % (ref 34.0–46.6)
Hemoglobin: 11.2 g/dL (ref 11.1–15.9)
MCH: 29 pg (ref 26.6–33.0)
MCHC: 31.2 g/dL — ABNORMAL LOW (ref 31.5–35.7)
MCV: 93 fL (ref 79–97)
Platelets: 160 x10E3/uL (ref 150–450)
RBC: 3.86 x10E6/uL (ref 3.77–5.28)
RDW: 13 % (ref 11.7–15.4)
WBC: 4.5 x10E3/uL (ref 3.4–10.8)

## 2024-07-13 ENCOUNTER — Other Ambulatory Visit: Payer: Self-pay | Admitting: Family Medicine

## 2024-07-27 ENCOUNTER — Telehealth: Payer: Self-pay | Admitting: Family Medicine

## 2024-07-27 NOTE — Telephone Encounter (Signed)
 See below.  Please check with daughter/start PA if needed.  Thanks.    Reason for CRM: Patient had been prescribe atorvastatin  (LIPITOR) 40 MG tablet and clopidogrel  (PLAVIX ) 75 MG tablet after having a stint put in and these two aren't being covered by insurance patient daughter  kim  call and said they also require a non committal form and to call her or leave a message on how to handle  [phone]

## 2024-07-28 NOTE — Telephone Encounter (Signed)
 Left message for patients daughter [name] to return call

## 2024-07-29 DIAGNOSIS — I4892 Unspecified atrial flutter: Secondary | ICD-10-CM | POA: Diagnosis not present

## 2024-07-29 NOTE — Telephone Encounter (Unsigned)
 Copied from CRM [id number]. Topic: General - Other >> Jul 29, 2024  2:25 PM Berneda FALCON wrote: Reason for CRM: Daughter Rick) calling to let Dr. Cleatus know it is fine to proceed with the prior authorization for both medications. Please give her a call back if there is anything else needed.  Patient callback is [phone]

## 2024-07-29 NOTE — Telephone Encounter (Signed)
 Copied from CRM [id number]. Topic: General - Other >> Jul 28, 2024  4:38 PM Jasmin G wrote: Reason for CRM: Pt received a call from Ms.Avaletta, please have her give pt a call back ASAP

## 2024-07-29 NOTE — Telephone Encounter (Signed)
 Lvm asking pt/pt's daughter, [name] (on dpr), to call back. Need to relay Dr Elfredia message about atorvastatin  (Lipitor) and clopidogrel  (Plavix ) and see if pt is agrees to submitting prior authorization for meds.

## 2024-07-29 NOTE — Telephone Encounter (Unsigned)
 Copied from CRM #[id number]. Topic: Clinical - Medication Prior Auth >> Jul 29, 2024  1:47 PM Donna BRAVO wrote: Reason for CRM: patient daughter [name] returning missed call. About prior auth for medicaion

## 2024-08-01 ENCOUNTER — Ambulatory Visit: Payer: Self-pay | Admitting: *Deleted

## 2024-08-01 ENCOUNTER — Other Ambulatory Visit: Payer: Self-pay | Admitting: Family Medicine

## 2024-08-01 MED ORDER — PREDNISONE 10 MG PO TABS: ORAL_TABLET | ORAL | 0 refills | Status: DC

## 2024-08-01 NOTE — Telephone Encounter (Signed)
 Please start prior authorization for Lipitor and Plavix .

## 2024-08-01 NOTE — Telephone Encounter (Signed)
 I sent prednisone  rx.  Please see about getting her on the schedule here in clinic when possible.  Thanks.

## 2024-08-01 NOTE — Telephone Encounter (Signed)
 Spoke with patients daughter [name] to advise that prescription has been sent and she is scheduled to see Dr. Cleatus 8/11

## 2024-08-01 NOTE — Telephone Encounter (Signed)
 Message from Berwyn MATSU sent at 08/01/2024 10:42 AM EDT  Reason for Triage:  patients daughter called in that patient is having itchiness all over body and nothing is working.    Call History  Contact Date/Time Type Contact Phone/Fax By  08/01/2024 10:40 AM EDT Phone (Incoming) Hannah Pitts, Hannah Pitts (Self) [phone] Hannah Pitts   Reason for Disposition  SEVERE itching (i.e., interferes with sleep, normal activities or school)    Requesting Dr. Cleatus call in prednisone  like he did before when she had this a month or 2 ago.   It really helped.   Benadryl  no longer helping that much.  Answer Assessment - Initial Assessment Questions 1. APPEARANCE of RASH: What does the rash look like? (e.g., blisters, dry flaky skin, red spots, redness, sores)     Returned call to daughter, Hannah Pitts answered the phone and is on the DPR so I spoke with Hannah Pitts.  Hannah Pitts made the call for me.       She is having itching all over.   No rash initially then after she claws her skin there turns red.   She is not scratching, she is clawing herself because she is itching so bad all over.   She has Lupus.   I think this is part of the Lupus.    She has mentioned this before with Dr. Cleatus the last 2 times she saw him.    The Benadryl  is no longer working.   He gave her prednisone  a while back and that helped.     She is taking 2 Benadryl  at the time and they are not helping.    It makes her sleepy too.     2. SIZE: How big are the spots? (e.g., tip of pen, eraser, coin; inches, centimeters)     Nothing visible.     She has had this before and Dr. Cleatus prescribed prednisone  for her and it helped. 3. LOCATION: Where is the rash located?     She is scratching all over. 4. COLOR: What color is the rash? (Note: It is difficult to assess rash color in people with darker-colored skin. When this situation occurs, simply ask the caller to describe what they see.)     No rash or anything visible.   Just itching all  over. 5. ONSET: When did the rash begin?     Over last few days 6. FEVER: Do you have a fever? If Yes, ask: What is your temperature, how was it measured, and when did it start?     Not asked 7. ITCHING: Does the rash itch? If Yes, ask: How bad is the itch? (Scale 1-10; or mild, moderate, severe)     She is scratching all over.   8. CAUSE: What do you think is causing the rash?     Part of the Lupus.   She's had this before and Dr. Cleatus gave her prednisone  for it which really helped.   The 2 Benadryl  are not helping that much now. 9. MEDICINE FACTORS: Have you started any new medicines within the last 2 weeks? (e.g., antibiotics)      No 10. OTHER SYMPTOMS: Do you have any other symptoms? (e.g., dizziness, headache, sore throat, joint pain)       No 11. PREGNANCY: Is there any chance you are pregnant? When was your last menstrual period?       N/A due to age  Protocols used: Rash or Redness - Widespread-A-AH FYI Only or Action  Required?: Action required by provider: clinical question for provider.  Patient was last seen in primary care on 06/09/2024 by Hannah Arlyss RAMAN, MD.  Called Nurse Triage reporting Rash. No rash visible.   Pt just itching all over and clawing at herself per daughter, Hannah Pitts.   Requesting prednisone  be called in.   It helped last time this happened.   Use [facility] on [address].    Symptoms began several days ago.  Interventions attempted: OTC medications: 2 Benadryl  at a time not really helping with the itching all over now.. Requesting prednisone  be called in like last time she had this severe itching all over.   Symptoms are: rapidly worsening.  Triage Disposition: Call PCP Now  Patient/caregiver understands and will follow disposition?: Yes

## 2024-08-02 ENCOUNTER — Telehealth: Payer: Self-pay

## 2024-08-02 ENCOUNTER — Other Ambulatory Visit (HOSPITAL_COMMUNITY): Payer: Self-pay

## 2024-08-02 DIAGNOSIS — I4892 Unspecified atrial flutter: Secondary | ICD-10-CM | POA: Diagnosis not present

## 2024-08-02 NOTE — Telephone Encounter (Signed)
 Pharmacy Patient Advocate Encounter   Received notification from Pt Calls Messages that prior authorization for Lipitor 40 and Plavix  75 is required/requested.   Insurance verification completed.   The patient is insured through [employer] .   Per test claim: Refill too soon. PA is not needed at this time. Medication was filled 06/04/24. Next eligible fill date is 08/11/24.  This is for both medications.

## 2024-08-08 ENCOUNTER — Ambulatory Visit (INDEPENDENT_AMBULATORY_CARE_PROVIDER_SITE_OTHER): Admitting: Family Medicine

## 2024-08-08 ENCOUNTER — Other Ambulatory Visit (INDEPENDENT_AMBULATORY_CARE_PROVIDER_SITE_OTHER)

## 2024-08-08 ENCOUNTER — Encounter: Payer: Self-pay | Admitting: Family Medicine

## 2024-08-08 VITALS — BP 128/64 | HR 69 | Temp 99.5°F | Ht 65.0 in

## 2024-08-08 DIAGNOSIS — Z7901 Long term (current) use of anticoagulants: Secondary | ICD-10-CM | POA: Diagnosis not present

## 2024-08-08 DIAGNOSIS — M329 Systemic lupus erythematosus, unspecified: Secondary | ICD-10-CM | POA: Diagnosis not present

## 2024-08-08 DIAGNOSIS — I2510 Atherosclerotic heart disease of native coronary artery without angina pectoris: Secondary | ICD-10-CM

## 2024-08-08 DIAGNOSIS — Z86718 Personal history of other venous thrombosis and embolism: Secondary | ICD-10-CM

## 2024-08-08 MED ORDER — PREDNISONE 10 MG PO TABS: ORAL_TABLET | ORAL | 1 refills | Status: DC

## 2024-08-08 MED ORDER — ATORVASTATIN CALCIUM 40 MG PO TABS: 40.0000 mg | ORAL_TABLET | Freq: Every day | ORAL | 3 refills | Status: AC

## 2024-08-08 MED ORDER — APIXABAN 5 MG PO TABS: 5.0000 mg | ORAL_TABLET | Freq: Two times a day (BID) | ORAL | Status: DC

## 2024-08-08 MED ORDER — CLOPIDOGREL BISULFATE 75 MG PO TABS: 75.0000 mg | ORAL_TABLET | Freq: Every day | ORAL | 3 refills | Status: DC

## 2024-08-08 NOTE — Patient Instructions (Signed)
 Price check lipitor and plavix .  I'll check on eliquis  help.  Take prednisone  in the meantime.  I'll check with rheumatology.  Take care.  Glad to see you.

## 2024-08-08 NOTE — Progress Notes (Signed)
 Prednisone  helped in the meantime, finishing current rx soon.    When she was not on prednisone  the itching started locally then spread diffusely on the legs, feet, back.  She has h/o lupus.  She didn't have this itching when on plaquenil .  Prev throat irritation and diarrhea on plaquenil  improved with plaquenil  cessation.   She can have small papules that erupt at the itchy areas.  Itching got better within 1 day of taking 20mg  prednisone .    She has an appointment with Dr. Jeannetta in 10/26/24.    No rash currently.    She had labs collected today.    She needs help paying for Eliquis .  Discussed.  Meds, vitals, and allergies reviewed.   ROS: Per HPI unless specifically indicated in ROS section   Nad Ncat Neck supple, no LA Rrr Ctab Abd soft, not ttp Skin well-perfused without acute rash. Chronic MCP and IP joint changes noted baseline without active erythema

## 2024-08-09 LAB — LIPID PANEL
Cholesterol: 122 mg/dL (ref 0–200)
HDL: 47.9 mg/dL (ref >39.00)
LDL Cholesterol: 50 mg/dL (ref 0–99)
NonHDL: 73.79
Total CHOL/HDL Ratio: 3
Triglycerides: 117 mg/dL (ref 0.0–149.0)
VLDL: 23.4 mg/dL (ref 0.0–40.0)

## 2024-08-09 LAB — HEPATIC FUNCTION PANEL
ALT: 25 U/L (ref 0–35)
AST: 21 U/L (ref 0–37)
Albumin: 4.1 g/dL (ref 3.5–5.2)
Alkaline Phosphatase: 88 U/L (ref 39–117)
Bilirubin, Direct: 0.1 mg/dL (ref 0.0–0.3)
Total Bilirubin: 0.5 mg/dL (ref 0.2–1.2)
Total Protein: 6.5 g/dL (ref 6.0–8.3)

## 2024-08-10 NOTE — Assessment & Plan Note (Signed)
 History of.  Asking for medication assistance with Eliquis .  No bleeding.

## 2024-08-10 NOTE — Assessment & Plan Note (Addendum)
 I do not know if her symptoms are related to lupus.  Prednisone  controls her symptoms but that is not a good long-term option.  I will route this note to Dr. Jeannetta to see if her appointment can get moved up and see if he has any input in the meantime.  Would continue prednisone  10 mg a day for now but she could try decreasing to half a pill per day if tolerated and if itching is controlled.  I appreciate the help of all involved.  Steroid cautions discussed.

## 2024-08-11 ENCOUNTER — Other Ambulatory Visit

## 2024-08-12 ENCOUNTER — Telehealth: Payer: Self-pay

## 2024-08-12 NOTE — Progress Notes (Signed)
 Care Guide Pharmacy Note  08/12/2024 Name: ELASIA FURNISH MRN: [id number] DOB: Feb 02, 1946  Referred By: Cleatus Arlyss RAMAN, MD Reason for referral: Complex Care Management and Call Attempt #1 (Unsuccessful initial outreach to schedule with PHARM D- Manuelita)   KENYADA DOSCH is a 78 y.o. year old female who is a primary care patient of Cleatus Arlyss RAMAN, MD.  DENICA WEB was referred to the pharmacist for assistance related to: medication assistance  An unsuccessful telephone outreach was attempted today to contact the patient who was referred to the pharmacy team for assistance with medication assistance. Additional attempts will be made to contact the patient.  Leotis Rase [facility], [facility] Guide  Direct Dial: [phone]  Fax [phone]

## 2024-08-12 NOTE — Progress Notes (Signed)
 Care Guide Pharmacy Note  08/12/2024 Name: AARINI SLEE MRN: [id number] DOB: February 12, 1946  Referred By: Cleatus Arlyss RAMAN, MD Reason for referral: Complex Care Management, Call Attempt #1 (Unsuccessful initial outreach to schedule with PHARM DGLENWOOD Shaver), and Call Attempt #2 (Successful initial outreach scheduled with PHARM D- Shaver)   KAROLYNA BIANCHINI is a 78 y.o. year old female who is a primary care patient of Cleatus Arlyss RAMAN, MD.  Heron FORBES Roof was referred to the pharmacist for assistance related to: medication assistance  Successful contact was made with the patient to discuss pharmacy services including being ready for the pharmacist to call at least 5 minutes before the scheduled appointment time and to have medication bottles and any blood pressure readings ready for review. The patient agreed to meet with the pharmacist via telephone visit on (date/time). 08/17/24 @ 10 AM  Leotis Cloria Davene Salome Romell Ralph Valrie, [facility] Guide  Direct Dial: [phone]  Fax [phone]

## 2024-08-15 NOTE — Progress Notes (Deleted)
 Cardiology Office Note    Date:  08/15/2024   ID:  Hannah Pitts, DOB 05-Jan-1946, MRN [id number]  PCP:  Cleatus Arlyss RAMAN, MD  Cardiologist:  None  Electrophysiologist:  None   Chief Complaint: Follow-up  History of Present Illness:   Hannah Pitts is a 78 y.o. female with history of CAD with NSTEMI in 05/2024 status post PCI/DES to the LCx and RCA, recently diagnosed of atrial flutter by echo in 05/2024, Sjogren's and lupus, bilateral DVTs and PE in 2018 complicated by left rectus abdominis hematoma while on rivaroxaban  status post IVC filter, pericarditis, orthostatic syncope, symptomatic PACs, aortic atherosclerosis, lymphoma, hypothyroidism, hepatic steatosis, Mnire's disease, GERD, and chronic back pain who presents for follow-up of echo and recently diagnosed atrial flutter.  She was previously followed by Drs. Micky and Allred.  She was evaluated by Dr. Raford in 2016 for abnormal EKG with concern for atrial flutter, with changes felt to be related to her resting tremor and not atrial flutter.  She also reported shortness of breath with echo in 10/2015 showed an EF of 60 to 65%, mild LVH, no regional wall motion normalities, grade 1 diastolic dysfunction, mild aortic insufficiency, trivial mitral regurgitation, and normal RV systolic function and ventricular cavity size.  Lexiscan  MPI in 10/2015 showed no significant ischemia or scar and was overall low risk with an EF of 55 to 65%.  Echo in 11/2017, during admission for showed an EF of 60 to 65%, no regional wall motion abnormalities, mild LVH, grade 1 diastolic dysfunction, trivial mitral regurgitation, normal RV systolic function.   She was seen at her PCP's office in 11/2023 for for follow-up of back pain.  Note indicates by the way, I have been blacking out.  She reported she could feel a trembling in her heart.  Duration was unclear with most recent event felt to be months prior.  She was without symptoms of angina or  cardiac decompensation.  This was the first time her PCP had been made aware of these episodes.  Labs were notable for mild renal dysfunction.  Zio patch in 11/2023 showed a predominant rhythm of sinus with an average rate of 62 bpm (range 45 to 158 bpm), 1 run of NSVT lasting 4 beats, 13 episodes of SVT lasting up to 18 beats, and rare supraventricular and ventricular ectopy.  No sustained arrhythmias, prolonged pauses, or evidence of high-grade AV block.   She was evaluated by cardiology on 02/22/2024 and reported an episode of syncope that occurred 3 to 4 months prior with associated prodrome of near syncope.  She was able to get to a chair and subsequently came to on the floor.  Episode occurred in the setting of increased low back pain.  No associated chest pain, palpitations, dyspnea, flushing, or loss of bowel/bladder function.  In total, her family member reported the patient may have had upwards of 4-5 syncopal episodes over the past 10 years.  Orthostatic vital signs were positive in the office.  Carotid artery ultrasound in 03/2024 showed near normal bilateral ICAs with antegrade flow of the bilateral vertebral arteries and disturbed flow in the right subclavian artery.  Echo in 03/2024 showed an EF of 55 to 60%, basal inferior and septal wall hypokinesis, grade 1 diastolic dysfunction, normal RV systolic function and ventricular cavity size, mild aortic insufficiency, and an estimated right atrial pressure of 3 mmHg.  Syncopal episodes were consistent with orthostatic syncope with recommendation to increase fluids and wear compression socks.  She was admitted to [facility] in 05/2024 with NSTEMI.  High-sensitivity troponin trended to 20,000.  CTA of the chest negative for PE.  EKG without acute ST-T changes with prior inferior infarct.  She underwent LHC which showed severe two-vessel CAD status post PCI/DES to the mid LCx and mid RCA.  Normal LV systolic function and LVEDP.  She was last seen in the  office on 06/10/2024 and was doing well from a cardiac perspective, without symptoms of angina, cardiac decompensation, dizziness, presyncope, or syncope.  She reported her functional status was somewhat improved following PCI.  In the setting of recent NSTEMI she underwent echo in 05/2024 that showed an EF of 55 to 60%, low normal RV systolic function with normal ventricular cavity size, mild mitral regurgitation, and an estimated right atrial pressure of 3 mmHg.  Rhythm strip during the study demonstrated atrial flutter.  In this setting aspirin  was discontinued and she was placed on apixaban .  Subsequent Zio patch showed a predominant rhythm of sinus with an average rate of 64, 2 episodes of NSVT lasting up to 19 beats, 38 episodes of SVT lasting up to 15 beats, and rare atrial and ventricular ectopy.  No further atrial flutter was identified.  ***   Labs independently reviewed: 07/2024 - albumin 4.1, AST/ALT normal, TC 122, TG 117, HDL 47, LDL 50 06/2024 - BUN 35, serum creatinine 1.22, potassium 4.8, Hgb 11.2, PLT 160 05/2024 - LP(a) less than 8.4 04/2024 -TSH 0.33  Past Medical History:  Diagnosis Date   Abnormal involuntary movements(781.0)    Anemia of other chronic disease    Depressive disorder, not elsewhere classified    Diaphragmatic hernia without mention of obstruction or gangrene    Dysthymic disorder    Generalized hyperhidrosis    GERD (gastroesophageal reflux disease)    H/O hiatal hernia    History of pulmonary embolus (PE)    Lymphoma (HCC) 03/2010   they got it all w/surgery   Meniere's disease of left ear    Migraine    used to have classic kind; not anymore; last one was in 2012   Other rheumatoid arthritis with visceral or systemic involvement    Palpitations    Personal history of other disorders of nervous system and sense organs    Systemic lupus erythematosus (HCC)    Unspecified disease of pericardium    Pericarditis    Unspecified hypothyroidism     Urticaria, unspecified     Past Surgical History:  Procedure Laterality Date   ABDOMINAL HYSTERECTOMY  1978   APPENDECTOMY  1974   BREAST BIOPSY  03/2010   left breast;    BREAST LUMPECTOMY  03/2010   left breast; nonHodgkins lymphoma; not breast cancer   CHOLECYSTECTOMY  1974   CORONARY STENT INTERVENTION N/A 06/03/2024   Procedure: CORONARY STENT INTERVENTION;  Surgeon: Swaziland, Peter M, MD;  Location: [facility] INVASIVE CV LAB;  Service: Cardiovascular;  Laterality: N/A;   ESOPHAGEAL DILATION  ?02/2011   ESOPHAGOGASTRODUODENOSCOPY  01-24-11   Slight stenosis esoph spasm   IR IVC FILTER PLMT / S&I /IMG GUID/MOD SED  12/17/2017   LEFT HEART CATH AND CORONARY ANGIOGRAPHY N/A 06/03/2024   Procedure: LEFT HEART CATH AND CORONARY ANGIOGRAPHY;  Surgeon: Swaziland, Peter M, MD;  Location: [facility] INVASIVE CV LAB;  Service: Cardiovascular;  Laterality: N/A;   Left VATS  12/1997   LUMBAR DISC SURGERY  1977   shunt     put in behind left ear ; Menier's syndrome  Current Medications: No outpatient medications have been marked as taking for the 08/18/24 encounter (Appointment) with Abigail Bernardino HERO, PA-C.    Allergies:   Heparin    Social History   Socioeconomic History   Marital status: Widowed    Spouse name: Not on file   Number of children: 2   Years of education: Not on file   Highest education level: Not on file  Occupational History   Occupation: Disabled     Employer: RETIRED  Tobacco Use   Smoking status: Never   Smokeless tobacco: Never  Vaping Use   Vaping status: Never Used  Substance and Sexual Activity   Alcohol  use: No   Drug use: No   Sexual activity: Never  Other Topics Concern   Not on file  Social History Narrative   Ms. Sharman lives in [place], [place].    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    Social  Drivers of Corporate investment banker Strain: Low Risk  (04/07/2024)   Overall Financial Resource Strain (CARDIA)    Difficulty of Paying Living Expenses: Not hard at all  Food Insecurity: No Food Insecurity (06/04/2024)   Hunger Vital Sign    Worried About Running Out of Food in the Last Year: Never true    Ran Out of Food in the Last Year: Never true  Transportation Needs: No Transportation Needs (06/04/2024)   PRAPARE - Administrator, Civil Service (Medical): No    Lack of Transportation (Non-Medical): No  Physical Activity: Insufficiently Active (04/07/2024)   Exercise Vital Sign    Days of Exercise per Week: 3 days    Minutes of Exercise per Session: 30 min  Stress: No Stress Concern Present (04/07/2024)   [employer] of Occupational Health - Occupational Stress Questionnaire    Feeling of Stress : Not at all  Social Connections: Moderately Isolated (06/04/2024)   Social Connection and Isolation Panel    Frequency of Communication with Friends and Family: Twice a week    Frequency of Social Gatherings with Friends and Family: Once a week    Attends Religious Services: 1 to 4 times per year    Active Member of [employer] or Organizations: No    Attends Banker Meetings: Never    Marital Status: Widowed     Family History:  The patient's family history includes Breast cancer in her maternal aunt; COPD in her sister; Cancer in her sister, sister, and sister; Coronary artery disease in her brother, sister, sister, and sister; Diabetes in her brother; Heart failure in her brother, mother, and sister; Stroke in her father. There is no history of Colon cancer.  ROS:   12-point review of systems is negative unless otherwise noted in the HPI.   EKGs/Labs/Other Studies Reviewed:    Studies reviewed were summarized above. The additional studies were reviewed today:  Zio patch 05/2024: Normal sinus rhythm Patient had a min HR of 43 bpm, max HR of 203 bpm, and avg  HR of 64 bpm.    2 Ventricular Tachycardia runs occurred, the run with the fastest interval lasting 19 beats with a max rate of 145 bpm (avg 119 bpm); the run with the fastest interval was also the longest.    38 Supraventricular Tachycardia runs occurred, the run with  the fastest interval lasting 8 beats with a max rate of 203 bpm, the longest lasting 15 beats with an avg rate of 122 bpm. Isolated SVEs were rare (<1.0%), SVE Couplets were rare (<1.0%), and SVE Triplets were rare (<1.0%).    Isolated VEs were rare (<1.0%, 7061), VE Couplets were rare (<1.0%, 94), and VE Triplets were rare (<1.0%, 6). Ventricular Trigeminy was present.  __________  Limited echo 06/24/2024: 1. Left ventricular ejection fraction, by estimation, is 55 to 60%. The  left ventricle has normal function.   2. Right ventricular systolic function is low normal. The right  ventricular size is normal.   3. The mitral valve is normal in structure. Mild mitral valve  regurgitation.   4. The aortic valve is tricuspid. Aortic valve regurgitation is not  visualized.   5. The inferior vena cava is normal in size with greater than 50%  respiratory variability, suggesting right atrial pressure of 3 mmHg.   6. Rhythm strip during this exam demonstrates atrial flutter.  __________  LHC 06/03/2024:   Prox RCA to Mid RCA lesion is 85% stenosed.   Mid Cx to Dist Cx lesion is 90% stenosed.   Ost LAD to Mid LAD lesion is 15% stenosed.   A drug-eluting stent was successfully placed using a STENT SYNERGY XD 2.75X12.   A drug-eluting stent was successfully placed using a STENT SYNERGY XD 2.50X20.   Post intervention, there is a 0% residual stenosis.   Post intervention, there is a 0% residual stenosis.   The left ventricular systolic function is normal.   LV end diastolic pressure is normal.   The left ventricular ejection fraction is 55-65% by visual estimate.   Recommend uninterrupted dual antiplatelet therapy with Aspirin  81mg   daily and Clopidogrel  75mg  daily for a minimum of 12 months (ACS-Class I recommendation).   Severe 2 vessel obstructive CAD Normal LV function Normal LVEDP Successful PCI of the mid LCx with DES x1 Successful PCI of the mid RCA with DES x 1   Plan: DAPT for one year. Anticipate DC in am. __________   2D echo 04/14/2024: 1. Left ventricular ejection fraction, by estimation, is 55 to 60%. Left  ventricular ejection fraction by 3D volume is 70 %. The left ventricle has  normal function. The left ventricle demonstrates regional wall motion  abnormalities (basal inferior and  septal wall hypokinesis). Left ventricular diastolic parameters are  consistent with Grade I diastolic dysfunction (impaired relaxation).   2. Right ventricular systolic function is normal. The right ventricular  size is normal.   3. The mitral valve is normal in structure. No evidence of mitral valve  regurgitation. No evidence of mitral stenosis.   4. The aortic valve is normal in structure. Aortic valve regurgitation is  mild. No aortic stenosis is present.   5. The inferior vena cava is normal in size with greater than 50%  respiratory variability, suggesting right atrial pressure of 3 mmHg.  __________   Carotid artery ultrasound 04/14/2024: Right Carotid: The extracranial vessels were near-normal with only minimal  wall thickening or plaque.   Left Carotid: The extracranial vessels were near-normal with only minimal  wall thickening or plaque.   Vertebrals:  Bilateral vertebral arteries demonstrate antegrade flow.  Subclavians: Right subclavian artery flow was disturbed. Normal flow hemodynamics were seen in the left subclavian artery.  __________   Zio patch 11/2023: HR 45 - 158, average 62 bpm. 1 nonsustained VT lasting 4 beats. 13 nonsustained SVT, longest 18 beats. Rare  supraventricular and ventricular ectopy. No atrial fibrillation. No sustained arrhythmias. __________   2D echo 12/13/2017: -  Left ventricle: The cavity size was normal. Wall thickness was    increased in a pattern of mild LVH. Systolic function was normal.    The estimated ejection fraction was in the range of 60% to 65%.    Wall motion was normal; there were no regional wall motion    abnormalities. Doppler parameters are consistent with abnormal    left ventricular relaxation (grade 1 diastolic dysfunction).  - Aortic valve: Mildly calcified annulus. Trileaflet.  - Mitral valve: There was trivial regurgitation.  - Right ventricle: Systolic function was normal.  - Tricuspid valve: There was trivial regurgitation.  - Pulmonary arteries: Systolic pressure could not be accurately    estimated.  - Pericardium, extracardiac: A prominent pericardial fat pad was    present.   Impressions:   - Images are limited. Mild LVH with LVEF 60-65% and grade 1    diastolic dysfunction. Mildly calcified aortic annulus. Trivial    mitral and tricuspid regurgitation. Grossly normal right    ventricular contraction. Prominent pericardial fat-pad.  __________   2D echo 11/27/2015: - Left ventricle: The cavity size was normal. There was mild    concentric hypertrophy. Systolic function was normal. The    estimated ejection fraction was in the range of 60% to 65%. Wall    motion was normal; there were no regional wall motion    abnormalities. Doppler parameters are consistent with abnormal    left ventricular relaxation (grade 1 diastolic dysfunction).  - Aortic valve: There was mild regurgitation.  - Mitral valve: There was trivial regurgitation.  - Right ventricle: The cavity size was normal. Wall thickness was    normal. Systolic function was normal.  __________   Lexiscan  MPI 11/20/2015: Nuclear stress EF: 59%. The left ventricular ejection fraction is normal (55-65%). There was no ST segment deviation noted during stress. The study is normal. This is a low risk study. Normal myocardial perfusion without scar or  ischemia.   Normal lexiscan  nuclear stress test demonstrating normal myocardial perfusion and function: EF 59%. __________   2D echo 08/04/2013: - Left ventricle: The cavity size was normal. Wall thickness    was increased in a pattern of mild LVH. Systolic function    was normal. The estimated ejection fraction was in the    range of 55% to 65%. Wall motion was normal; there were no    regional wall motion abnormalities. Doppler parameters are    consistent with abnormal left ventricular relaxation    (grade 1 diastolic dysfunction).  - Aortic valve: Mild regurgitation.  - Mitral valve: Mild regurgitation.  - Atrial septum: No defect or patent foramen ovale was    identified.  ___________   2D echo 04/21/2012: - Left ventricle: The cavity size was normal. Wall thickness    was normal. Systolic function was normal. The estimated    ejection fraction was in the range of 60% to 65%. Wall    motion was normal; there were no regional wall motion    abnormalities. Doppler parameters are consistent with    abnormal left ventricular relaxation (grade 1 diastolic    dysfunction).  - Aortic valve: Mild regurgitation.    EKG:  EKG is ordered today.  The EKG ordered today demonstrates ***  Recent Labs: 05/20/2024: TSH 0.33 07/04/2024: BUN 35; Creatinine, Ser 1.22; Hemoglobin 11.2; Platelets 160; Potassium 4.8; Sodium 142 08/08/2024: ALT 25  Recent  Lipid Panel    Component Value Date/Time   CHOL 122 08/08/2024 1454   TRIG 117.0 08/08/2024 1454   HDL 47.90 08/08/2024 1454   CHOLHDL 3 08/08/2024 1454   VLDL 23.4 08/08/2024 1454   LDLCALC 50 08/08/2024 1454   LDLCALC 112 (H) 05/20/2024 1623    PHYSICAL EXAM:    VS:  There were no vitals taken for this visit.  BMI: There is no height or weight on file to calculate BMI.  Physical Exam  Wt Readings from Last 3 Encounters:  06/10/24 207 lb (93.9 kg)  06/03/24 207 lb 3.7 oz (94 kg)  05/20/24 208 lb (94.3 kg)     ASSESSMENT & PLAN:    CAD involving native coronary arteries with recent NSTEMI with ***:  Atrial flutter: ***.  CHA2DS2-VASc at least 4 (age x 2, vascular disease, sex category)  Near syncope/syncope:  Aortic atherosclerosis/HLD: LDL  History of bilateral DVTs and PE:  Abnormal thyroid  function:  Renal dysfunction:   {Are you ordering a CV Procedure (e.g. stress test, cath, DCCV, TEE, etc)?   Press F2        :[id number]}     Disposition: F/u with Dr. PIERRETTE or an APP in ***.   Medication Adjustments/Labs and Tests Ordered: Current medicines are reviewed at length with the patient today.  Concerns regarding medicines are outlined above. Medication changes, Labs and Tests ordered today are summarized above and listed in the Patient Instructions accessible in Encounters.   Signed, Bernardino Bring, PA-C 08/15/2024 9:07 AM     [facility] [place] [address] [place], [place] [address] [phone]

## 2024-08-17 ENCOUNTER — Other Ambulatory Visit (INDEPENDENT_AMBULATORY_CARE_PROVIDER_SITE_OTHER): Admitting: Pharmacist

## 2024-08-17 DIAGNOSIS — Z7901 Long term (current) use of anticoagulants: Secondary | ICD-10-CM

## 2024-08-17 NOTE — Progress Notes (Signed)
   08/17/2024 Name: Hannah Pitts MRN: [id number] DOB: [birth year]  Subjective  Chief Complaint  Patient presents with   Medication Access    Care Team: Primary Care Provider: Cleatus Arlyss RAMAN, MD  Reason for visit: ?  Hannah Pitts is a 78 y.o. female who presents today for a telephone visit with the pharmacist due to medication access concerns regarding their Eliquis . Accompanied by daughter, [name] who facilitated discussion given patient is hard or hearing.   Medication Access: ?  Prescription drug coverage: YES Payor: Advertising copywriter MEDICARE / Plan: [facility] MEDICARE / Product Type: *No Product type* / .   Reports that all medications are not affordable. Paid $300 for her last refill of Eliquis .  The pharmacy informed her that she would meet her deductible with that fill. They also informed her the insurance was estimated a co-insurance of $47/month. Patient notes $47/month is affordable though it was not explained clearly if the deductible amount is paid once yearly or more frequently.   Test claim unavailable. Too soon to fill.  Next fill payable after 08/26/24.   Al other medication are affordable according to patient.   Current Patient Assistance: N/A Patient lives in a household of 1 with an income consisting of social security retirement.   Medicare LIS Eligible: Possibly. Unsure of her monthly gross income. Net ~1570. Confirms they are below the asset limit.   Assessment and Plan:   1. Medication Access Patient lives in a household of 1 with an estimated annual income of <300% FPL and therefore may be eligible for patient assistance. Notably, [employer]MS requires OOP pharmacy expenditure of >3% household income. 3% of estimated income would be >$500 on prescription medication costs in 2025 which patient will likely not reach.   Xarelto  program more forgiving though appears patient has already tried Xarelto  in the past with some complications. Per cardiology: prior rectus sheath  hematoma in 2018 while on rivaroxaban  for DVT/PE.   Daughter reports that the $300 deductible + $47 monthly copay is feasible for them each year.  Recommend application for Medicare LIS Extra Help. If approved, would eliminate her drug deductible and cap her brand-med copay to ~$12/mo. Patient/daughter given my direct line. They would like to evaluate patient's income/savings #s and will call for assistance with application    Future Appointments  Date Time Provider Department Center  08/18/2024  1:30 PM Abigail Bernardino HERO, PA-C [facility] None  10/26/2024  1:20 PM Jeannetta Lonni ORN, MD [facility] None  04/10/2025  8:50 AM [facility] ANNUAL WELLNESS VISIT 1 [facility]    Hannah Pitts, PharmD Clinical Pharmacist [facility] [phone]

## 2024-08-18 ENCOUNTER — Ambulatory Visit: Admitting: Physician Assistant

## 2024-08-18 DIAGNOSIS — I4892 Unspecified atrial flutter: Secondary | ICD-10-CM

## 2024-08-18 DIAGNOSIS — I951 Orthostatic hypotension: Secondary | ICD-10-CM

## 2024-08-18 DIAGNOSIS — I700 Atherosclerosis of aorta: Secondary | ICD-10-CM

## 2024-08-18 DIAGNOSIS — Z86718 Personal history of other venous thrombosis and embolism: Secondary | ICD-10-CM

## 2024-08-18 DIAGNOSIS — Z86711 Personal history of pulmonary embolism: Secondary | ICD-10-CM

## 2024-08-18 DIAGNOSIS — R55 Syncope and collapse: Secondary | ICD-10-CM

## 2024-08-18 DIAGNOSIS — E785 Hyperlipidemia, unspecified: Secondary | ICD-10-CM

## 2024-08-18 DIAGNOSIS — N289 Disorder of kidney and ureter, unspecified: Secondary | ICD-10-CM

## 2024-08-18 DIAGNOSIS — I2510 Atherosclerotic heart disease of native coronary artery without angina pectoris: Secondary | ICD-10-CM

## 2024-09-19 ENCOUNTER — Other Ambulatory Visit: Payer: Self-pay | Admitting: Family Medicine

## 2024-09-20 ENCOUNTER — Emergency Department

## 2024-09-20 ENCOUNTER — Other Ambulatory Visit: Payer: Self-pay

## 2024-09-20 ENCOUNTER — Ambulatory Visit: Payer: Self-pay

## 2024-09-20 ENCOUNTER — Inpatient Hospital Stay
Admission: EM | Admit: 2024-09-20 | Discharge: 2024-09-23 | DRG: 872 | Disposition: A | Discharge status: Home / Self Care | Attending: Obstetrics and Gynecology | Admitting: Obstetrics and Gynecology

## 2024-09-20 DIAGNOSIS — Z7902 Long term (current) use of antithrombotics/antiplatelets: Secondary | ICD-10-CM

## 2024-09-20 DIAGNOSIS — I10 Essential (primary) hypertension: Secondary | ICD-10-CM | POA: Diagnosis present

## 2024-09-20 DIAGNOSIS — R933 Abnormal findings on diagnostic imaging of other parts of digestive tract: Secondary | ICD-10-CM

## 2024-09-20 DIAGNOSIS — I700 Atherosclerosis of aorta: Secondary | ICD-10-CM | POA: Diagnosis present

## 2024-09-20 DIAGNOSIS — Z8249 Family history of ischemic heart disease and other diseases of the circulatory system: Secondary | ICD-10-CM

## 2024-09-20 DIAGNOSIS — Z90710 Acquired absence of both cervix and uterus: Secondary | ICD-10-CM

## 2024-09-20 DIAGNOSIS — M545 Low back pain, unspecified: Secondary | ICD-10-CM | POA: Diagnosis present

## 2024-09-20 DIAGNOSIS — E039 Hypothyroidism, unspecified: Secondary | ICD-10-CM | POA: Diagnosis not present

## 2024-09-20 DIAGNOSIS — Z833 Family history of diabetes mellitus: Secondary | ICD-10-CM

## 2024-09-20 DIAGNOSIS — Z87898 Personal history of other specified conditions: Secondary | ICD-10-CM

## 2024-09-20 DIAGNOSIS — E872 Acidosis, unspecified: Secondary | ICD-10-CM | POA: Diagnosis present

## 2024-09-20 DIAGNOSIS — H9190 Unspecified hearing loss, unspecified ear: Secondary | ICD-10-CM | POA: Diagnosis present

## 2024-09-20 DIAGNOSIS — Z8572 Personal history of non-Hodgkin lymphomas: Secondary | ICD-10-CM

## 2024-09-20 DIAGNOSIS — C184 Malignant neoplasm of transverse colon: Secondary | ICD-10-CM | POA: Diagnosis not present

## 2024-09-20 DIAGNOSIS — Z888 Allergy status to other drugs, medicaments and biological substances status: Secondary | ICD-10-CM

## 2024-09-20 DIAGNOSIS — E669 Obesity, unspecified: Secondary | ICD-10-CM | POA: Diagnosis present

## 2024-09-20 DIAGNOSIS — L930 Discoid lupus erythematosus: Secondary | ICD-10-CM | POA: Diagnosis present

## 2024-09-20 DIAGNOSIS — I2510 Atherosclerotic heart disease of native coronary artery without angina pectoris: Secondary | ICD-10-CM | POA: Diagnosis present

## 2024-09-20 DIAGNOSIS — R918 Other nonspecific abnormal finding of lung field: Secondary | ICD-10-CM | POA: Diagnosis not present

## 2024-09-20 DIAGNOSIS — K219 Gastro-esophageal reflux disease without esophagitis: Secondary | ICD-10-CM | POA: Diagnosis present

## 2024-09-20 DIAGNOSIS — K6389 Other specified diseases of intestine: Secondary | ICD-10-CM

## 2024-09-20 DIAGNOSIS — J209 Acute bronchitis, unspecified: Secondary | ICD-10-CM | POA: Diagnosis present

## 2024-09-20 DIAGNOSIS — Z66 Do not resuscitate: Secondary | ICD-10-CM | POA: Diagnosis not present

## 2024-09-20 DIAGNOSIS — Z7901 Long term (current) use of anticoagulants: Secondary | ICD-10-CM

## 2024-09-20 DIAGNOSIS — Z86718 Personal history of other venous thrombosis and embolism: Secondary | ICD-10-CM

## 2024-09-20 DIAGNOSIS — A419 Sepsis, unspecified organism: Secondary | ICD-10-CM | POA: Diagnosis not present

## 2024-09-20 DIAGNOSIS — I5032 Chronic diastolic (congestive) heart failure: Secondary | ICD-10-CM | POA: Diagnosis not present

## 2024-09-20 DIAGNOSIS — J440 Chronic obstructive pulmonary disease with acute lower respiratory infection: Secondary | ICD-10-CM | POA: Diagnosis present

## 2024-09-20 DIAGNOSIS — D490 Neoplasm of unspecified behavior of digestive system: Secondary | ICD-10-CM

## 2024-09-20 DIAGNOSIS — R509 Fever, unspecified: Secondary | ICD-10-CM | POA: Diagnosis not present

## 2024-09-20 DIAGNOSIS — Z9049 Acquired absence of other specified parts of digestive tract: Secondary | ICD-10-CM

## 2024-09-20 DIAGNOSIS — D122 Benign neoplasm of ascending colon: Secondary | ICD-10-CM | POA: Diagnosis present

## 2024-09-20 DIAGNOSIS — M069 Rheumatoid arthritis, unspecified: Secondary | ICD-10-CM | POA: Diagnosis not present

## 2024-09-20 DIAGNOSIS — N390 Urinary tract infection, site not specified: Secondary | ICD-10-CM | POA: Diagnosis not present

## 2024-09-20 DIAGNOSIS — N200 Calculus of kidney: Secondary | ICD-10-CM | POA: Diagnosis not present

## 2024-09-20 DIAGNOSIS — N1831 Chronic kidney disease, stage 3a: Secondary | ICD-10-CM

## 2024-09-20 DIAGNOSIS — D631 Anemia in chronic kidney disease: Secondary | ICD-10-CM | POA: Diagnosis not present

## 2024-09-20 DIAGNOSIS — Z955 Presence of coronary angioplasty implant and graft: Secondary | ICD-10-CM

## 2024-09-20 DIAGNOSIS — Z7982 Long term (current) use of aspirin: Secondary | ICD-10-CM

## 2024-09-20 DIAGNOSIS — D500 Iron deficiency anemia secondary to blood loss (chronic): Secondary | ICD-10-CM | POA: Diagnosis present

## 2024-09-20 DIAGNOSIS — K449 Diaphragmatic hernia without obstruction or gangrene: Secondary | ICD-10-CM | POA: Diagnosis not present

## 2024-09-20 DIAGNOSIS — I130 Hypertensive heart and chronic kidney disease with heart failure and stage 1 through stage 4 chronic kidney disease, or unspecified chronic kidney disease: Secondary | ICD-10-CM | POA: Diagnosis not present

## 2024-09-20 DIAGNOSIS — F411 Generalized anxiety disorder: Secondary | ICD-10-CM | POA: Diagnosis present

## 2024-09-20 DIAGNOSIS — D125 Benign neoplasm of sigmoid colon: Secondary | ICD-10-CM | POA: Diagnosis not present

## 2024-09-20 DIAGNOSIS — K5730 Diverticulosis of large intestine without perforation or abscess without bleeding: Secondary | ICD-10-CM | POA: Diagnosis not present

## 2024-09-20 DIAGNOSIS — Z95828 Presence of other vascular implants and grafts: Secondary | ICD-10-CM

## 2024-09-20 DIAGNOSIS — N1832 Chronic kidney disease, stage 3b: Secondary | ICD-10-CM | POA: Diagnosis present

## 2024-09-20 DIAGNOSIS — Z79890 Hormone replacement therapy (postmenopausal): Secondary | ICD-10-CM

## 2024-09-20 DIAGNOSIS — Z6833 Body mass index (BMI) 33.0-33.9, adult: Secondary | ICD-10-CM

## 2024-09-20 DIAGNOSIS — R197 Diarrhea, unspecified: Secondary | ICD-10-CM | POA: Diagnosis not present

## 2024-09-20 DIAGNOSIS — F0390 Unspecified dementia without behavioral disturbance: Secondary | ICD-10-CM | POA: Diagnosis present

## 2024-09-20 DIAGNOSIS — Z7952 Long term (current) use of systemic steroids: Secondary | ICD-10-CM

## 2024-09-20 DIAGNOSIS — Z1152 Encounter for screening for COVID-19: Secondary | ICD-10-CM

## 2024-09-20 DIAGNOSIS — Z86711 Personal history of pulmonary embolism: Secondary | ICD-10-CM

## 2024-09-20 DIAGNOSIS — R0602 Shortness of breath: Secondary | ICD-10-CM | POA: Diagnosis not present

## 2024-09-20 DIAGNOSIS — Z79899 Other long term (current) drug therapy: Secondary | ICD-10-CM

## 2024-09-20 DIAGNOSIS — M329 Systemic lupus erythematosus, unspecified: Secondary | ICD-10-CM | POA: Diagnosis present

## 2024-09-20 DIAGNOSIS — J90 Pleural effusion, not elsewhere classified: Secondary | ICD-10-CM | POA: Diagnosis not present

## 2024-09-20 DIAGNOSIS — R112 Nausea with vomiting, unspecified: Secondary | ICD-10-CM

## 2024-09-20 DIAGNOSIS — I252 Old myocardial infarction: Secondary | ICD-10-CM

## 2024-09-20 LAB — URINALYSIS, ROUTINE W REFLEX MICROSCOPIC
Bacteria, UA: NONE SEEN
Bilirubin Urine: NEGATIVE
Glucose, UA: NEGATIVE mg/dL
Hgb urine dipstick: NEGATIVE
Ketones, ur: NEGATIVE mg/dL
Nitrite: NEGATIVE
Protein, ur: 30 mg/dL — AB
Specific Gravity, Urine: >1.046 — ABNORMAL HIGH (ref 1.005–1.030)
WBC, UA: >50 WBC/hpf (ref 0–5)
pH: 5 (ref 5.0–8.0)

## 2024-09-20 LAB — COMPREHENSIVE METABOLIC PANEL WITH GFR
ALT: 24 U/L (ref 0–44)
AST: 23 U/L (ref 15–41)
Albumin: 3.7 g/dL (ref 3.5–5.0)
Alkaline Phosphatase: 107 U/L (ref 38–126)
Anion gap: 12 (ref 5–15)
BUN: 29 mg/dL — ABNORMAL HIGH (ref 8–23)
CO2: 22 mmol/L (ref 22–32)
Calcium: 8.9 mg/dL (ref 8.9–10.3)
Chloride: 104 mmol/L (ref 98–111)
Creatinine, Ser: 1.31 mg/dL — ABNORMAL HIGH (ref 0.44–1.00)
GFR, Estimated: 41 mL/min — ABNORMAL LOW (ref >60)
Glucose, Bld: 164 mg/dL — ABNORMAL HIGH (ref 70–99)
Potassium: 4.3 mmol/L (ref 3.5–5.1)
Sodium: 138 mmol/L (ref 135–145)
Total Bilirubin: 0.6 mg/dL (ref 0.0–1.2)
Total Protein: 6.8 g/dL (ref 6.5–8.1)

## 2024-09-20 LAB — CBC
HCT: 30.5 % — ABNORMAL LOW (ref 36.0–46.0)
Hemoglobin: 9.2 g/dL — ABNORMAL LOW (ref 12.0–15.0)
MCH: 26.1 pg (ref 26.0–34.0)
MCHC: 30.2 g/dL (ref 30.0–36.0)
MCV: 86.4 fL (ref 80.0–100.0)
Platelets: 220 K/uL (ref 150–400)
RBC: 3.53 MIL/uL — ABNORMAL LOW (ref 3.87–5.11)
RDW: 13.6 % (ref 11.5–15.5)
WBC: 14.3 K/uL — ABNORMAL HIGH (ref 4.0–10.5)
nRBC: 0 % (ref 0.0–0.2)

## 2024-09-20 LAB — RETICULOCYTES
Immature Retic Fract: 32.4 % — ABNORMAL HIGH (ref 2.3–15.9)
RBC.: 3.54 MIL/uL — ABNORMAL LOW (ref 3.87–5.11)
Retic Count, Absolute: 78.6 K/uL (ref 19.0–186.0)
Retic Ct Pct: 2.2 % (ref 0.4–3.1)

## 2024-09-20 LAB — LACTIC ACID, PLASMA
Lactic Acid, Venous: 2.2 mmol/L (ref 0.5–1.9)
Lactic Acid, Venous: 1.4 mmol/L (ref 0.5–1.9)

## 2024-09-20 LAB — LIPASE, BLOOD: Lipase: 53 U/L — ABNORMAL HIGH (ref 11–51)

## 2024-09-20 MED ORDER — METRONIDAZOLE 500 MG/100ML IV SOLN
500.0000 mg | Freq: Two times a day (BID) | INTRAVENOUS | Status: DC
  Administered 2024-09-20 – 2024-09-22 (×4): 500 mg via INTRAVENOUS
  Filled 2024-09-20 (×4): qty 100

## 2024-09-20 MED ORDER — ACETAMINOPHEN 325 MG PO TABS: 650.0000 mg | ORAL_TABLET | Freq: Four times a day (QID) | ORAL | Status: DC | PRN

## 2024-09-20 MED ORDER — ENOXAPARIN SODIUM 60 MG/0.6ML IJ SOSY
0.5000 mg/kg | PREFILLED_SYRINGE | INTRAMUSCULAR | Status: DC
  Administered 2024-09-20 – 2024-09-21 (×2): 45 mg via SUBCUTANEOUS
  Filled 2024-09-20 (×2): qty 0.6

## 2024-09-20 MED ORDER — ATORVASTATIN CALCIUM 20 MG PO TABS
40.0000 mg | ORAL_TABLET | Freq: Every day | ORAL | Status: DC
Start: 2024-09-20 — End: 2024-09-23
  Administered 2024-09-20 – 2024-09-22 (×3): 40 mg via ORAL
  Filled 2024-09-20 (×3): qty 2

## 2024-09-20 MED ORDER — ESCITALOPRAM OXALATE 10 MG PO TABS
20.0000 mg | ORAL_TABLET | Freq: Every day | ORAL | Status: DC
Start: 2024-09-20 — End: 2024-09-23
  Administered 2024-09-20 – 2024-09-22 (×3): 20 mg via ORAL
  Filled 2024-09-20 (×3): qty 2

## 2024-09-20 MED ORDER — IOHEXOL 300 MG/ML  SOLN
80.0000 mL | Freq: Once | INTRAMUSCULAR | Status: AC | PRN
  Administered 2024-09-20: 80 mL via INTRAVENOUS

## 2024-09-20 MED ORDER — NYSTATIN 100000 UNIT/GM EX POWD: 1.0000 | Freq: Two times a day (BID) | CUTANEOUS | Status: DC | PRN

## 2024-09-20 MED ORDER — LACTATED RINGERS IV SOLN
150.0000 mL/h | INTRAVENOUS | Status: AC
  Administered 2024-09-20 – 2024-09-21 (×2): 150 mL/h via INTRAVENOUS

## 2024-09-20 MED ORDER — SODIUM CHLORIDE 0.9 % IV SOLN
1.0000 g | Freq: Once | INTRAVENOUS | Status: AC
  Administered 2024-09-20: 1 g via INTRAVENOUS
  Filled 2024-09-20: qty 10

## 2024-09-20 MED ORDER — CLOPIDOGREL BISULFATE 75 MG PO TABS
75.0000 mg | ORAL_TABLET | Freq: Every day | ORAL | Status: DC
Start: 2024-09-20 — End: 2024-09-21
  Administered 2024-09-20: 75 mg via ORAL
  Filled 2024-09-20: qty 1

## 2024-09-20 MED ORDER — SODIUM CHLORIDE 0.9 % IV SOLN: 12.5000 mg | Freq: Four times a day (QID) | INTRAVENOUS | Status: DC | PRN

## 2024-09-20 MED ORDER — ONDANSETRON HCL 4 MG/2ML IJ SOLN: 4.0000 mg | Freq: Four times a day (QID) | INTRAMUSCULAR | Status: DC | PRN

## 2024-09-20 MED ORDER — NITROGLYCERIN 0.4 MG SL SUBL: 0.4000 mg | SUBLINGUAL_TABLET | SUBLINGUAL | Status: DC | PRN

## 2024-09-20 MED ORDER — LEVOTHYROXINE SODIUM 112 MCG PO TABS
112.0000 ug | ORAL_TABLET | Freq: Every day | ORAL | Status: DC
Start: 2024-09-21 — End: 2024-09-23
  Administered 2024-09-21 – 2024-09-22 (×2): 112 ug via ORAL
  Filled 2024-09-20 (×3): qty 1

## 2024-09-20 MED ORDER — PANTOPRAZOLE SODIUM 40 MG IV SOLR
40.0000 mg | INTRAVENOUS | Status: DC
  Administered 2024-09-20 – 2024-09-22 (×3): 40 mg via INTRAVENOUS
  Filled 2024-09-20 (×3): qty 10

## 2024-09-20 MED ORDER — SODIUM CHLORIDE 0.9 % IV SOLN
2.0000 g | INTRAVENOUS | Status: DC
  Administered 2024-09-21: 2 g via INTRAVENOUS
  Filled 2024-09-20 (×2): qty 20

## 2024-09-20 MED ORDER — PRAMIPEXOLE DIHYDROCHLORIDE 0.25 MG PO TABS
0.1250 mg | ORAL_TABLET | Freq: Every day | ORAL | Status: DC
  Administered 2024-09-21 – 2024-09-22 (×2): 0.125 mg via ORAL
  Filled 2024-09-20 (×4): qty 0.5

## 2024-09-20 MED ORDER — SODIUM CHLORIDE 0.9 % IV BOLUS
500.0000 mL | Freq: Once | INTRAVENOUS | Status: AC
  Administered 2024-09-20: 500 mL via INTRAVENOUS

## 2024-09-20 MED ORDER — HYDROCODONE-ACETAMINOPHEN 5-325 MG PO TABS: 1.0000 | ORAL_TABLET | ORAL | Status: DC | PRN

## 2024-09-20 MED ORDER — DIPHENHYDRAMINE HCL 25 MG PO CAPS
25.0000 mg | ORAL_CAPSULE | Freq: Four times a day (QID) | ORAL | Status: DC | PRN
Start: 2024-09-20 — End: 2024-09-23

## 2024-09-20 MED ORDER — ONDANSETRON HCL 4 MG PO TABS: 4.0000 mg | ORAL_TABLET | Freq: Four times a day (QID) | ORAL | Status: DC | PRN

## 2024-09-20 MED ORDER — SODIUM CHLORIDE 0.9 % IV SOLN
1.0000 g | Freq: Once | INTRAVENOUS | Status: AC
  Administered 2024-09-20: 1 g via INTRAVENOUS
  Filled 2024-09-20 (×2): qty 10

## 2024-09-20 MED ORDER — ACETAMINOPHEN 650 MG RE SUPP: 650.0000 mg | Freq: Four times a day (QID) | RECTAL | Status: DC | PRN

## 2024-09-20 NOTE — Assessment & Plan Note (Signed)
 S/p cardiac workup February 2025 showing PACs and occasional SVTs No acute issues

## 2024-09-20 NOTE — Assessment & Plan Note (Signed)
 Presence of IVC filter since 2018 Patient only recently started on anticoagulation

## 2024-09-20 NOTE — Telephone Encounter (Signed)
 Error reported

## 2024-09-20 NOTE — H&P (Signed)
 History and Physical    Patient: Hannah Pitts FMW:[id number] DOB: 10-15-1946 DOA: 09/20/2024 DOS: the patient was seen and examined on 09/20/2024 PCP: Cleatus Arlyss RAMAN, MD  Patient coming from: Home  Chief Complaint:  Chief Complaint  Patient presents with   Emesis    HPI: Hannah Pitts is a 78 y.o. female with medical history significant for Lupus on prn prednisone /plaquenil , PE 2019 s/p IVC filter , CAD s/p stent x 2 on 06/03/2024 on Plavix  and apixaban , diastolic CHF, orthostatic syncope being admitted with sepsis suspect secondary to UTI with CT abdomen pelvis showing concern for colonic mass and possible metastasis vs adenopathy.  She presented with several episodes of nausea and vomiting that started earlier in the day associated with abdominal pain and later development of chills and fevers.  Emesis was nonbloody and non-coffee-ground.  She denies change in bowel habits.  Denies dysuria.  No colonoscopy in the last 10 years. In the ED, Tmax 100.7 and tachycardic to 105, BP 106/75, fluid responsive to 129/81.  Labs notable for leukocytosis of 14,000 and lactic acid 2.2.  Lipase 53 and LFTs normal.  Urinalysis with moderate leukocytes9 point and squamous cells.  Hemoglobin 2, down from baseline 11.2 a couple months prior, creatinine 1.31 which is her baseline. Chest x-ray nonacute CT abdomen and pelvis with contrast showing a peritoneal mass adjacent to the mid transverse colon concerning for colon cancer with adjacent peritoneal metastases or adenopathy.  Recommendation for GI consult for colonoscopy and also soft tissue mass biopsy/diagnosis Patient started on Rocephin  and given a 1 L fluid bolus Admission requested.     Review of Systems: As mentioned in the history of present illness. All other systems reviewed and are negative.  Past Medical History:  Diagnosis Date   Abnormal involuntary movements(781.0)    Anemia of other chronic disease    Depressive disorder, not  elsewhere classified    Diaphragmatic hernia without mention of obstruction or gangrene    Dysthymic disorder    Generalized hyperhidrosis    GERD (gastroesophageal reflux disease)    H/O hiatal hernia    History of pulmonary embolus (PE)    Lymphoma (HCC) 03/2010   they got it all w/surgery   Meniere's disease of left ear    Migraine    used to have classic kind; not anymore; last one was in 2012   Other rheumatoid arthritis with visceral or systemic involvement    Palpitations    Personal history of other disorders of nervous system and sense organs    Systemic lupus erythematosus (HCC)    Unspecified disease of pericardium    Pericarditis    Unspecified hypothyroidism    Urticaria, unspecified    Past Surgical History:  Procedure Laterality Date   ABDOMINAL HYSTERECTOMY  1978   APPENDECTOMY  1974   BREAST BIOPSY  03/2010   left breast;    BREAST LUMPECTOMY  03/2010   left breast; nonHodgkins lymphoma; not breast cancer   CHOLECYSTECTOMY  1974   CORONARY STENT INTERVENTION N/A 06/03/2024   Procedure: CORONARY STENT INTERVENTION;  Surgeon: Swaziland, Peter M, MD;  Location: [facility];  Service: Cardiovascular;  Laterality: N/A;   ESOPHAGEAL DILATION  ?02/2011   ESOPHAGOGASTRODUODENOSCOPY  01-24-11   Slight stenosis esoph spasm   IR IVC FILTER PLMT / S&I /IMG GUID/MOD SED  12/17/2017   LEFT HEART CATH AND CORONARY ANGIOGRAPHY N/A 06/03/2024   Procedure: LEFT HEART CATH AND CORONARY ANGIOGRAPHY;  Surgeon: Swaziland, Peter M,  MD;  Location: [facility];  Service: Cardiovascular;  Laterality: N/A;   Left VATS  12/1997   LUMBAR DISC SURGERY  1977   shunt     put in behind left ear ; Menier's syndrome   Social History:  reports that she has never smoked. She has never used smokeless tobacco. She reports that she does not drink alcohol  and does not use drugs.  Allergies  Allergen Reactions   Heparin  Other (See Comments)    Bruising/bleeding    Family History   Problem Relation Age of Onset   Heart failure Mother    Stroke Father    Coronary artery disease Sister    COPD Sister    Coronary artery disease Brother    Diabetes Brother    Heart failure Brother    Coronary artery disease Sister    Coronary artery disease Sister    Cancer Sister    Heart failure Sister    Cancer Sister        lung, esoph   Cancer Sister        lung   Breast cancer Maternal Aunt    Colon cancer Neg Hx     Prior to Admission medications   Medication Sig Start Date End Date Taking? Authorizing Provider  acetaminophen  (TYLENOL ) 500 MG tablet Take 500 mg by mouth every 6 (six) hours as needed.   Yes [provider]  apixaban  (ELIQUIS ) 5 MG TABS tablet Take 1 tablet (5 mg total) by mouth 2 (two) times daily. 08/08/24  Yes Cleatus Arlyss RAMAN, MD  atorvastatin  (LIPITOR) 40 MG tablet Take 1 tablet (40 mg total) by mouth daily. 08/08/24  Yes Cleatus Arlyss RAMAN, MD  clopidogrel  (PLAVIX ) 75 MG tablet Take 1 tablet (75 mg total) by mouth daily with breakfast. 08/08/24  Yes Cleatus Arlyss RAMAN, MD  diphenhydrAMINE  HCl 12.5 MG TBDP Take 12.5-25 mg by mouth every 6 (six) hours as needed for itching.   Yes [provider]  escitalopram  (LEXAPRO ) 20 MG tablet Take 1 tablet (20 mg total) by mouth daily. 05/20/24  Yes Cleatus Arlyss RAMAN, MD  levothyroxine  (SYNTHROID ) 112 MCG tablet TAKE 1 TABLET (112 MCG TOTAL) BY MOUTH DAILY BEFORE BREAKFAST. EXCEPT SKIP SUNDAY. 6 TABS A WEEK. 07/13/24  Yes Cleatus Arlyss RAMAN, MD  nitroGLYCERIN  (NITROSTAT ) 0.4 MG SL tablet Place 1 tablet (0.4 mg total) under the tongue every 5 (five) minutes x 3 doses as needed for chest pain. 06/04/24  Yes Vicci Rollo SAUNDERS, PA-C  pramipexole  (MIRAPEX ) 0.125 MG tablet TAKE 1 TABLET BY MOUTH AT BEDTIME. TO REPLACE REQUIP  04/12/24  Yes Cleatus Arlyss RAMAN, MD  nystatin  (MYCOSTATIN /NYSTOP ) powder Apply 1 Application topically 2 (two) times daily as needed. 06/09/24   Cleatus Arlyss RAMAN, MD  predniSONE  (DELTASONE ) 10 MG  tablet 10mg  daily with food. Don't take with aleve/ibuprofen . If better, try cutting back to 1/2 tab a day. Patient not taking: Reported on 09/20/2024 08/08/24   Cleatus Arlyss RAMAN, MD    Physical Exam: Vitals:   09/20/24 1610 09/20/24 1630 09/20/24 1700 09/20/24 2035  BP:  107/61 110/60 129/81  Pulse:  82 79 70  Resp:   14 16  Temp: 98.3 F (36.8 C)   98.4 F (36.9 C)  TempSrc: Oral   Oral  SpO2:  98% 98% 97%  Weight:      Height:       Physical Exam Vitals and nursing note reviewed.  Constitutional:      General: She is  not in acute distress. HENT:     Head: Normocephalic and atraumatic.  Cardiovascular:     Rate and Rhythm: Normal rate and regular rhythm.     Heart sounds: Normal heart sounds.  Pulmonary:     Effort: Pulmonary effort is normal.     Breath sounds: Normal breath sounds.  Abdominal:     Palpations: Abdomen is soft.     Tenderness: There is abdominal tenderness in the right lower quadrant.  Neurological:     Mental Status: Mental status is at baseline.     Labs on Admission: I have personally reviewed following labs and imaging studies  CBC: Recent Labs  Lab 09/20/24 1249  WBC 14.3*  HGB 9.2*  HCT 30.5*  MCV 86.4  PLT 220   Basic Metabolic Panel: Recent Labs  Lab 09/20/24 1249  NA 138  K 4.3  CL 104  CO2 22  GLUCOSE 164*  BUN 29*  CREATININE 1.31*  CALCIUM  8.9   GFR: Estimated Creatinine Clearance: 38.8 mL/min (A) (by C-G formula based on SCr of 1.31 mg/dL (H)). Liver Function Tests: Recent Labs  Lab 09/20/24 1249  AST 23  ALT 24  ALKPHOS 107  BILITOT 0.6  PROT 6.8  ALBUMIN 3.7   Recent Labs  Lab 09/20/24 1249  LIPASE 53*   No results for input(s): AMMONIA in the last 168 hours. Coagulation Profile: No results for input(s): INR, PROTIME in the last 168 hours. Cardiac Enzymes: No results for input(s): CKTOTAL, CKMB, CKMBINDEX, TROPONINI in the last 168 hours. BNP (last 3 results) No results for input(s):  PROBNP in the last 8760 hours. HbA1C: No results for input(s): HGBA1C in the last 72 hours. CBG: No results for input(s): GLUCAP in the last 168 hours. Lipid Profile: No results for input(s): CHOL, HDL, LDLCALC, TRIG, CHOLHDL, LDLDIRECT in the last 72 hours. Thyroid  Function Tests: No results for input(s): TSH, T4TOTAL, FREET4, T3FREE, THYROIDAB in the last 72 hours. Anemia Panel: No results for input(s): VITAMINB12, FOLATE, FERRITIN, TIBC, IRON, RETICCTPCT in the last 72 hours. Urine analysis:    Component Value Date/Time   COLORURINE YELLOW (A) 09/20/2024 1942   APPEARANCEUR CLOUDY (A) 09/20/2024 1942   LABSPEC >1.046 (H) 09/20/2024 1942   PHURINE 5.0 09/20/2024 1942   GLUCOSEU NEGATIVE 09/20/2024 1942   HGBUR NEGATIVE 09/20/2024 1942   HGBUR negative 12/27/2010 0801   BILIRUBINUR NEGATIVE 09/20/2024 1942   BILIRUBINUR Neg 05/20/2013 1010   KETONESUR NEGATIVE 09/20/2024 1942   PROTEINUR 30 (A) 09/20/2024 1942   UROBILINOGEN negative 05/20/2013 1010   UROBILINOGEN 0.2 12/27/2010 0801   NITRITE NEGATIVE 09/20/2024 1942   LEUKOCYTESUR MODERATE (A) 09/20/2024 1942    Radiological Exams on Admission: CT ABDOMEN PELVIS W CONTRAST Result Date: 09/20/2024 CLINICAL DATA:  Abdominal pain, nausea, vomiting EXAM: CT ABDOMEN AND PELVIS WITH CONTRAST TECHNIQUE: Multidetector CT imaging of the abdomen and pelvis was performed using the standard protocol following bolus administration of intravenous contrast. RADIATION DOSE REDUCTION: This exam was performed according to the departmental dose-optimization program which includes automated exposure control, adjustment of the mA and/or kV according to patient size and/or use of iterative reconstruction technique. CONTRAST:  80mL OMNIPAQUE  IOHEXOL  300 MG/ML  SOLN COMPARISON:  12/16/2017 FINDINGS: Lower chest: No acute findings. Coronary artery and aortic calcifications. Hepatobiliary: No focal liver abnormality  is seen. Status post cholecystectomy. No biliary dilatation. Pancreas: No focal abnormality or ductal dilatation. Spleen: No focal abnormality.  Normal size. Adrenals/Urinary Tract: Adrenal glands normal. Small scattered bilateral subcentimeter renal  cysts. No follow-up imaging recommended. 2 mm nonobstructing stones in the lower poles of both kidneys. No ureteral stones or hydronephrosis. Stomach/Bowel: Stomach and small bowel decompressed, unremarkable. 2.6 x 2.2 cm soft tissue nodule anteriorly adjacent to the mid transverse colon. The underlying: Wall may be mildly thickened. Cannot exclude transverse colon malignancy and adjacent spread or adenopathy. No bowel obstruction. Sigmoid diverticulosis. Vascular/Lymphatic: Aortic atherosclerosis. Other than the anterior soft tissue nodule adjacent to the transverse colon, no adenopathy seen. Reproductive: Prior hysterectomy.  No adnexal masses. Other: No free fluid or free air. Musculoskeletal: No acute bony abnormality. IMPRESSION: 2.6 x 2.2 cm rounded soft tissue peritoneal mass anteriorly adjacent to the mid transverse colon. Underlying colon wall appears abnormal. Appearance is concerning for possible colon cancer with adjacent peritoneal metastasis or adenopathy. Recommend GI consultation for colonoscopy and/or soft tissue mass biopsy/diagnosis. Sigmoid diverticulosis.  No active diverticulitis. Aortic atherosclerosis. Electronically Signed   By: Franky Crease M.D.   On: 09/20/2024 18:05   DG Chest Portable 1 View Result Date: 09/20/2024 CLINICAL DATA:  Shortness of breath.  Nausea and vomiting. EXAM: PORTABLE CHEST 1 VIEW COMPARISON:  06/03/2024 FINDINGS: Shallow inspiration. Mild cardiac enlargement. No vascular congestion, edema, or consolidation. Blunting of the costophrenic angles suggesting small pleural effusions. Probable mild atelectasis in the lung bases. No pneumothorax. Old rib fractures. Calcification of the aorta. IMPRESSION: 1. Cardiac  enlargement. 2. Small bilateral pleural effusions with likely basilar atelectasis. No focal consolidation. Electronically Signed   By: Elsie Gravely M.D.   On: 09/20/2024 17:07   Data Reviewed for HPI: Relevant notes from primary care and specialist visits, past discharge summaries as available in EHR, including Care Everywhere. Prior diagnostic testing as pertinent to current admission diagnoses Updated medications and problem lists for reconciliation ED course, including vitals, labs, imaging, treatment and response to treatment Triage notes, nursing and pharmacy notes and ED provider's notes Notable results as noted above in HPI      Assessment and Plan: * Sepsis (HCC) Urinary tract infection, possible Sepsis criteria include fever, tachycardia, leukocytosis and lactic acidosis, abnormal UA but also with finding of colonic mass on CT Sepsis fluids Antibiotics for intra-abdominal source which will cover UTI Follow blood cultures, urine culture  Nausea and vomiting History of hiatal hernia and esophageal stricture Colonic mass on CT Suspect vomiting is mostly due to acute infection N.p.o. except for ice chips IV Protonix , IV antiemetics, IV pain meds Might benefit from upper and lower endoscopy Will consult GI  Colonic mass on CT History of non-Hodgkin's lymphoma Patient recently started on antiplatelets and anticoagulation s/p recent cardiac cath We will consult GI for recommendations Cardiology consult might be appropriate to determine timing if DAPT/anticoagulation needs to be paused  CAD S/P stent x 2 06/03/2024 History of NSTEMI June 2025 Currently on Plavix  and Eliquis , atorvastatin  and nitroglycerin  No complaints of chest pain We will get baseline troponin  Iron deficiency anemia due to chronic blood loss, suspected Colonic mass on CT Hemoglobin 9.2, down from 11.2 a couple months prior Anemia panel Check H&H in the a.m. GI consult for consideration of  colonoscopy  History of pulmonary embolism Presence of IVC filter since 2018 Patient only recently started on anticoagulation  History of orthostatic syncope S/p cardiac workup February 2025 showing PACs and occasional SVTs No acute issues  Stage 3a chronic kidney disease (HCC) At baseline  Low back pain As needed pain meds  ERYTHEMATOSUS, LUPUS On as needed prednisone  Has been on Plaquenil  but developed side  effect of diarrhea And recent rash and currently on prednisone  Scheduled for new rheumatology visit in October  Hypothyroidism Continue levothyroxine     DVT prophylaxis: Lovenox   Consults: GI  Advance Care Planning:   Code Status: Prior   Family Communication: none  Disposition Plan: Back to previous home environment  Severity of Illness: The appropriate patient status for this patient is OBSERVATION. Observation status is judged to be reasonable and necessary in order to provide the required intensity of service to ensure the patient's safety. The patient's presenting symptoms, physical exam findings, and initial radiographic and laboratory data in the context of their medical condition is felt to place them at decreased risk for further clinical deterioration. Furthermore, it is anticipated that the patient will be medically stable for discharge from the hospital within 2 midnights of admission.   Author: Delayne LULLA Solian, MD 09/20/2024 9:40 PM  For on call review [email or web address].

## 2024-09-20 NOTE — Telephone Encounter (Signed)
 FYI Only or Action Required?: Action required by provider: clinical question for provider.  Patient was last seen in primary care on 08/08/2024 by Cleatus Arlyss RAMAN, MD.  Called Nurse Triage reporting Fever.  Symptoms began today.  Interventions attempted: Nothing.  Symptoms are: unchanged.  Triage Disposition: See HCP Within 4 Hours (Or PCP Triage)  Patient/caregiver understands and will follow disposition?: Yes  Copied from CRM [id number]. Topic: Clinical - Red Word Triage >> Sep 20, 2024 10:53 AM Jasmin G wrote: [facility] that prompted transfer to Nurse Triage: Fever, chills, throwing up, lower stomach pain, no diarrhea or any other symptoms that started this morning. Reason for Disposition  SEVERE chills (i.e., feeling extremely cold WITH shaking chills)    Pt daughter reports severe chills, unsure if she has fever.  Answer Assessment - Initial Assessment Questions Offered appt today, pt's daughter declined and reports I am unable to get her in the car. I'll need help; she's stubborn and I can't get her in my car by myself. I will have to call EMS for transportation.  Advised UC today.  1. TEMPERATURE: What is the most recent temperature?  How was it measured?      Unsure, has severe chills 2. ONSET: When did the fever start?      All started This morning 3. CHILLS: Do you have chills? If yes: How bad are they?  (e.g., none, mild, moderate, severe)     severe 4. OTHER SYMPTOMS: Do you have any other symptoms besides the fever?  (e.g., abdomen pain, cough, diarrhea, earache, headache, sore throat, urination pain)     N/v, lower adb pain. Denies pain with urination, cough, runny nose. Patient taking sips of water. 5. CAUSE: If there are no symptoms, ask: What do you think is causing the fever?      Yes, unsure 6. CONTACTS: Does anyone else in the family have an infection?     no 7. TREATMENT: What have you done so far to treat this fever? (e.g., OTC fever  medicines)     no 8. IMMUNOCOMPROMISE: Do you have any of the following: diabetes, HIV positive, splenectomy, cancer chemotherapy, chronic steroid treatment, transplant patient, etc.?     no  10. TRAVEL: Have you traveled out of the country in the last month? (e.g., travel history, exposures)       no  Protocols used: [facility]

## 2024-09-20 NOTE — Assessment & Plan Note (Signed)
 At baseline

## 2024-09-20 NOTE — Telephone Encounter (Signed)
 FYI  Patient presented to the ED today

## 2024-09-20 NOTE — Assessment & Plan Note (Signed)
 Urinary tract infection, possible Sepsis criteria include fever, tachycardia, leukocytosis and lactic acidosis, abnormal UA but also with finding of colonic mass on CT Sepsis fluids Antibiotics for intra-abdominal source which will cover UTI Follow blood cultures, urine culture

## 2024-09-20 NOTE — Telephone Encounter (Signed)
Noted, will await ER report.  Thanks.  

## 2024-09-20 NOTE — Assessment & Plan Note (Signed)
 History of hiatal hernia and esophageal stricture Colonic mass on CT Suspect vomiting is mostly due to acute infection N.p.o. except for ice chips IV Protonix , IV antiemetics, IV pain meds Might benefit from upper and lower endoscopy Will consult GI

## 2024-09-20 NOTE — Telephone Encounter (Signed)
 This was sent to the wrong pool. Seems that patients daughter will take her to ED by EMS.

## 2024-09-20 NOTE — Assessment & Plan Note (Signed)
 Continue levothyroxine 

## 2024-09-20 NOTE — ED Notes (Addendum)
 Rn called over to pt. Pt yelling out, I need some help, I'm passing out. This RN explaining blood work is in process and vitals have been obtained. This RN further stating that pt is not passing out due to pt looking this RN in the eyes and talking to this RN.

## 2024-09-20 NOTE — Progress Notes (Signed)
 Anticoagulation monitoring(Lovenox ):  78 yo female ordered Lovenox  40 mg Q24h    Filed Weights   09/20/24 1246 09/20/24 1541  Weight: 90.7 kg (200 lb) 90.7 kg (200 lb)   BMI 33.3   Lab Results  Component Value Date   CREATININE 1.31 (H) 09/20/2024   CREATININE 1.22 (H) 07/04/2024   CREATININE 1.30 (H) 06/24/2024   Estimated Creatinine Clearance: 38.8 mL/min (A) (by C-G formula based on SCr of 1.31 mg/dL (H)). Hemoglobin & Hematocrit     Component Value Date/Time   HGB 9.2 (L) 09/20/2024 1249   HGB 11.2 07/04/2024 1143   HGB 12.9 08/22/2010 1115   HCT 30.5 (L) 09/20/2024 1249   HCT 35.9 07/04/2024 1143   HCT 37.7 08/22/2010 1115     Per Protocol for Patient with estCrcl > 30 ml/min and BMI > 30, will transition to Lovenox  45 mg Q24h.

## 2024-09-20 NOTE — ED Provider Notes (Signed)
 [facility] Provider Note    Event Date/Time   First MD Initiated Contact with Patient 09/20/24 1522     (approximate)   History   Emesis   HPI  Hannah Pitts is a 78 y.o. female who presents to the emergency department today because of concerns for nausea and vomiting.  Had several episodes of vomiting this morning.  At the time my exam she does feel somewhat better.  She endorses abdominal discomfort which she thinks might be due to her vomiting. She developed chills and fevers today. She denies any bloody emesis.    Physical Exam   Triage Vital Signs: ED Triage Vitals [09/20/24 1246]  Encounter Vitals Group     BP 106/75     Girls Systolic BP Percentile      Girls Diastolic BP Percentile      Boys Systolic BP Percentile      Boys Diastolic BP Percentile      Pulse Rate (!) 105     Resp 18     Temp (!) 100.7 F (38.2 C)     Temp Source Oral     SpO2 96 %     Weight 200 lb (90.7 kg)     Height      Head Circumference      Peak Flow      Pain Score 0     Pain Loc      Pain Education      Exclude from Growth Chart     Most recent vital signs: Vitals:   09/20/24 1246  BP: 106/75  Pulse: (!) 105  Resp: 18  Temp: (!) 100.7 F (38.2 C)  SpO2: 96%   General: Awake, alert, oriented. CV:  Good peripheral perfusion. Tachycardia. Resp:  Normal effort. Lungs clear. Abd:  No distention.   ED Results / Procedures / Treatments   Labs (all labs ordered are listed, but only abnormal results are displayed) Labs Reviewed  COMPREHENSIVE METABOLIC PANEL WITH GFR - Abnormal; Notable for the following components:      Result Value   Glucose, Bld 164 (*)    BUN 29 (*)    Creatinine, Ser 1.31 (*)    GFR, Estimated 41 (*)    All other components within normal limits  CBC - Abnormal; Notable for the following components:   WBC 14.3 (*)    RBC 3.53 (*)    Hemoglobin 9.2 (*)    HCT 30.5 (*)    All other components within normal limits   URINALYSIS, ROUTINE W REFLEX MICROSCOPIC - Abnormal; Notable for the following components:   Color, Urine YELLOW (*)    APPearance CLOUDY (*)    Specific Gravity, Urine >1.046 (*)    Protein, ur 30 (*)    Leukocytes,Ua MODERATE (*)    Non Squamous Epithelial PRESENT (*)    All other components within normal limits  LIPASE, BLOOD - Abnormal; Notable for the following components:   Lipase 53 (*)    All other components within normal limits  LACTIC ACID, PLASMA - Abnormal; Notable for the following components:   Lactic Acid, Venous 2.2 (*)    All other components within normal limits  CULTURE, BLOOD (ROUTINE X 2)  CULTURE, BLOOD (ROUTINE X 2)  URINE CULTURE  LACTIC ACID, PLASMA     EKG  None   RADIOLOGY I independently interpreted and visualized the CXR. My interpretation: No pneumonia Radiology interpretation:  IMPRESSION:  1. Cardiac enlargement.  2.  Small bilateral pleural effusions with likely basilar  atelectasis. No focal consolidation.    I independently interpreted and visualized the CT abd/pel. My interpretation: No free air. No inflammation. Radiology interpretation:  IMPRESSION:  2.6 x 2.2 cm rounded soft tissue peritoneal mass anteriorly adjacent  to the mid transverse colon. Underlying colon wall appears abnormal.  Appearance is concerning for possible colon cancer with adjacent  peritoneal metastasis or adenopathy. Recommend GI consultation for  colonoscopy and/or soft tissue mass biopsy/diagnosis.    Sigmoid diverticulosis.  No active diverticulitis.    Aortic atherosclerosis.     PROCEDURES:  Critical Care performed: Yes  CRITICAL CARE Performed by: Guadalupe Eagles   Total critical care time: 30 minutes  Critical care time was exclusive of separately billable procedures and treating other patients.  Critical care was necessary to treat or prevent imminent or life-threatening deterioration.  Critical care was time spent personally by me on  the following activities: development of treatment plan with patient and/or surrogate as well as nursing, discussions with consultants, evaluation of patient's response to treatment, examination of patient, obtaining history from patient or surrogate, ordering and performing treatments and interventions, ordering and review of laboratory studies, ordering and review of radiographic studies, pulse oximetry and re-evaluation of patient's condition.   Procedures    MEDICATIONS ORDERED IN ED: Medications - No data to display   IMPRESSION / MDM / ASSESSMENT AND PLAN / ED COURSE  I reviewed the triage vital signs and the nursing notes.                              Differential diagnosis includes, but is not limited to, intraabdominal infection, gastroenteritis, viral illness, pneumonia, UTI  Patient's presentation is most consistent with acute presentation with potential threat to life or bodily function.   The patient is on the cardiac monitor to evaluate for evidence of arrhythmia and/or significant heart rate changes.  Patient presented to the emergency department today with concerns for nausea and vomiting.  Patient was noted to be febrile here.  Given nausea and vomiting I did have concern for intra-abdominal infection.  A CT scan was obtained of did not show any concerning acute abnormalities, however did raise concern for abdominal mass. Discussed this with the patient and that she would benefit from colonoscopy.  Chest x-ray without pneumonia.  Urine was concerning for infection.  Will start antibiotics.  Discussed with Dr. Cleatus with the hospitalist who will evaluate for admission.      FINAL CLINICAL IMPRESSION(S) / ED DIAGNOSES   Final diagnoses:  Lower urinary tract infection         Note:  This document was prepared using Dragon voice recognition software and may include unintentional dictation errors.    Eagles Guadalupe, MD 09/20/24 2200

## 2024-09-20 NOTE — Assessment & Plan Note (Signed)
 History of NSTEMI June 2025 Currently on Plavix  and Eliquis , atorvastatin  and nitroglycerin  No complaints of chest pain We will get baseline troponin

## 2024-09-20 NOTE — ED Triage Notes (Signed)
 Pt arrived via EMS from home. Pt has been having N/V for the last day with several episodes today. EMS sts that pt was febrile on their arrival and gave 1g of tylenol .

## 2024-09-20 NOTE — Assessment & Plan Note (Signed)
 On as needed prednisone  Has been on Plaquenil  but developed side effect of diarrhea And recent rash and currently on prednisone  Scheduled for new rheumatology visit in October

## 2024-09-20 NOTE — Assessment & Plan Note (Addendum)
 History of non-Hodgkin's lymphoma Patient recently started on antiplatelets and anticoagulation s/p recent cardiac cath We will consult GI for recommendations Cardiology consult might be appropriate to determine timing if DAPT/anticoagulation needs to be paused

## 2024-09-20 NOTE — Assessment & Plan Note (Addendum)
 Colonic mass on CT Hemoglobin 9.2, down from 11.2 a couple months prior Anemia panel Check H&H in the a.m. GI consult for consideration of colonoscopy

## 2024-09-20 NOTE — Assessment & Plan Note (Signed)
As needed pain meds 

## 2024-09-20 NOTE — ED Notes (Signed)
 ED Provider at bedside.

## 2024-09-21 DIAGNOSIS — I130 Hypertensive heart and chronic kidney disease with heart failure and stage 1 through stage 4 chronic kidney disease, or unspecified chronic kidney disease: Secondary | ICD-10-CM | POA: Diagnosis not present

## 2024-09-21 DIAGNOSIS — K6389 Other specified diseases of intestine: Secondary | ICD-10-CM | POA: Diagnosis not present

## 2024-09-21 DIAGNOSIS — D122 Benign neoplasm of ascending colon: Secondary | ICD-10-CM | POA: Diagnosis not present

## 2024-09-21 DIAGNOSIS — F0390 Unspecified dementia without behavioral disturbance: Secondary | ICD-10-CM | POA: Diagnosis present

## 2024-09-21 DIAGNOSIS — C184 Malignant neoplasm of transverse colon: Secondary | ICD-10-CM | POA: Diagnosis not present

## 2024-09-21 DIAGNOSIS — I2510 Atherosclerotic heart disease of native coronary artery without angina pectoris: Secondary | ICD-10-CM | POA: Diagnosis not present

## 2024-09-21 DIAGNOSIS — E872 Acidosis, unspecified: Secondary | ICD-10-CM | POA: Diagnosis present

## 2024-09-21 DIAGNOSIS — H9190 Unspecified hearing loss, unspecified ear: Secondary | ICD-10-CM | POA: Diagnosis present

## 2024-09-21 DIAGNOSIS — D631 Anemia in chronic kidney disease: Secondary | ICD-10-CM | POA: Diagnosis not present

## 2024-09-21 DIAGNOSIS — E669 Obesity, unspecified: Secondary | ICD-10-CM | POA: Diagnosis present

## 2024-09-21 DIAGNOSIS — Z1152 Encounter for screening for COVID-19: Secondary | ICD-10-CM | POA: Diagnosis not present

## 2024-09-21 DIAGNOSIS — I5032 Chronic diastolic (congestive) heart failure: Secondary | ICD-10-CM | POA: Diagnosis present

## 2024-09-21 DIAGNOSIS — D125 Benign neoplasm of sigmoid colon: Secondary | ICD-10-CM | POA: Diagnosis not present

## 2024-09-21 DIAGNOSIS — I509 Heart failure, unspecified: Secondary | ICD-10-CM | POA: Diagnosis not present

## 2024-09-21 DIAGNOSIS — L930 Discoid lupus erythematosus: Secondary | ICD-10-CM | POA: Diagnosis present

## 2024-09-21 DIAGNOSIS — D500 Iron deficiency anemia secondary to blood loss (chronic): Secondary | ICD-10-CM | POA: Diagnosis present

## 2024-09-21 DIAGNOSIS — E039 Hypothyroidism, unspecified: Secondary | ICD-10-CM | POA: Diagnosis present

## 2024-09-21 DIAGNOSIS — K449 Diaphragmatic hernia without obstruction or gangrene: Secondary | ICD-10-CM | POA: Diagnosis present

## 2024-09-21 DIAGNOSIS — N390 Urinary tract infection, site not specified: Secondary | ICD-10-CM | POA: Diagnosis present

## 2024-09-21 DIAGNOSIS — I700 Atherosclerosis of aorta: Secondary | ICD-10-CM | POA: Diagnosis present

## 2024-09-21 DIAGNOSIS — N1832 Chronic kidney disease, stage 3b: Secondary | ICD-10-CM | POA: Insufficient documentation

## 2024-09-21 DIAGNOSIS — J440 Chronic obstructive pulmonary disease with acute lower respiratory infection: Secondary | ICD-10-CM | POA: Diagnosis present

## 2024-09-21 DIAGNOSIS — F411 Generalized anxiety disorder: Secondary | ICD-10-CM | POA: Diagnosis present

## 2024-09-21 DIAGNOSIS — Z66 Do not resuscitate: Secondary | ICD-10-CM | POA: Diagnosis present

## 2024-09-21 DIAGNOSIS — L818 Other specified disorders of pigmentation: Secondary | ICD-10-CM | POA: Diagnosis not present

## 2024-09-21 DIAGNOSIS — M545 Low back pain, unspecified: Secondary | ICD-10-CM | POA: Diagnosis present

## 2024-09-21 DIAGNOSIS — A419 Sepsis, unspecified organism: Secondary | ICD-10-CM | POA: Diagnosis present

## 2024-09-21 DIAGNOSIS — M069 Rheumatoid arthritis, unspecified: Secondary | ICD-10-CM | POA: Diagnosis present

## 2024-09-21 LAB — CBC
HCT: 26 % — ABNORMAL LOW (ref 36.0–46.0)
Hemoglobin: 7.9 g/dL — ABNORMAL LOW (ref 12.0–15.0)
MCH: 26.1 pg (ref 26.0–34.0)
MCHC: 30.4 g/dL (ref 30.0–36.0)
MCV: 85.8 fL (ref 80.0–100.0)
Platelets: 178 K/uL (ref 150–400)
RBC: 3.03 MIL/uL — ABNORMAL LOW (ref 3.87–5.11)
RDW: 13.7 % (ref 11.5–15.5)
WBC: 14.2 K/uL — ABNORMAL HIGH (ref 4.0–10.5)
nRBC: 0 % (ref 0.0–0.2)

## 2024-09-21 LAB — BASIC METABOLIC PANEL WITH GFR
Anion gap: 12 (ref 5–15)
BUN: 34 mg/dL — ABNORMAL HIGH (ref 8–23)
CO2: 24 mmol/L (ref 22–32)
Calcium: 8.5 mg/dL — ABNORMAL LOW (ref 8.9–10.3)
Chloride: 106 mmol/L (ref 98–111)
Creatinine, Ser: 1.56 mg/dL — ABNORMAL HIGH (ref 0.44–1.00)
GFR, Estimated: 34 mL/min — ABNORMAL LOW (ref >60)
Glucose, Bld: 105 mg/dL — ABNORMAL HIGH (ref 70–99)
Potassium: 4.3 mmol/L (ref 3.5–5.1)
Sodium: 142 mmol/L (ref 135–145)

## 2024-09-21 LAB — IRON AND TIBC
Iron: <10 ug/dL — ABNORMAL LOW (ref 28–170)
TIBC: 410 ug/dL (ref 250–450)

## 2024-09-21 LAB — VITAMIN B12: Vitamin B-12: 352 pg/mL (ref 180–914)

## 2024-09-21 LAB — RESP PANEL BY RT-PCR (RSV, FLU A&B, COVID)  RVPGX2
Influenza A by PCR: NEGATIVE
Influenza B by PCR: NEGATIVE
Resp Syncytial Virus by PCR: NEGATIVE
SARS Coronavirus 2 by RT PCR: NEGATIVE

## 2024-09-21 LAB — FERRITIN: Ferritin: 9 ng/mL — ABNORMAL LOW (ref 11–307)

## 2024-09-21 LAB — FOLATE: Folate: >20 ng/mL (ref >5.9)

## 2024-09-21 MED ORDER — POLYVINYL ALCOHOL 1.4 % OP SOLN
1.0000 [drp] | Freq: Two times a day (BID) | OPHTHALMIC | Status: DC | PRN
  Filled 2024-09-21: qty 15

## 2024-09-21 MED ORDER — ASPIRIN 81 MG PO TBEC: 81.0000 mg | DELAYED_RELEASE_TABLET | Freq: Every day | ORAL | Status: DC

## 2024-09-21 NOTE — Progress Notes (Signed)
 Mobility Specialist Progress Note:    09/21/24 0935  Mobility  Activity Ambulated with assistance  Level of Assistance Contact guard assist, steadying assist  Assistive Device Front wheel walker  Distance Ambulated (ft) 25 ft  Range of Motion/Exercises Active;All extremities  Activity Response Tolerated well  Mobility visit 1 Mobility  Mobility Specialist Start Time (ACUTE ONLY) [phone]  Mobility Specialist Stop Time (ACUTE ONLY) [id number]  Mobility Specialist Time Calculation (min) (ACUTE ONLY) 11 min   Pt received sitting EOB, agreeable to mobility. Required CGA to stand and ambulate with RW. Tolerated well, pt expressing fear of falling d/t weakness and SOB. Returned pt sitting EOB, SpO2 98% on RA and HR 106 bpm. Will return later with a chair follow. All needs met.  Sherrilee Ditty Mobility Specialist Please contact via Special educational needs teacher or  Rehab office at [phone]

## 2024-09-21 NOTE — Progress Notes (Signed)
   09/21/24 1939 09/21/24 2103  Assess: MEWS Score  Temp 98.9 F (37.2 C) 99.3 F (37.4 C)  BP 132/63 (!) 133/92  MAP (mmHg) 82 101  Pulse Rate 75 77  Resp (!) 26 (!) 32  Level of Consciousness Alert  --   SpO2 95 % 95 %  O2 Device Room Air Room Air  Assess: MEWS Score  MEWS Temp 0 0  MEWS Systolic 0 0  MEWS Pulse 0 0  MEWS RR 2 2  MEWS LOC 0 0  MEWS Score 2 2  MEWS Score Color Yellow Yellow  Assess: if the MEWS score is Yellow or Red  Were vital signs accurate and taken at a resting state? Yes Yes  Does the patient meet 2 or more of the SIRS criteria? No No  Does the patient have a confirmed or suspected source of infection? No No  MEWS guidelines implemented  Yes, yellow Yes, yellow  Treat  MEWS Interventions Considered administering scheduled or prn medications/treatments as ordered Considered administering scheduled or prn medications/treatments as ordered  Take Vital Signs  Increase Vital Sign Frequency  Yellow: Q2hr x1, continue Q4hrs until patient remains green for 12hrs Yellow: Q2hr x1, continue Q4hrs until patient remains green for 12hrs  Escalate  MEWS: Escalate Yellow: Discuss with charge nurse and consider notifying provider and/or RRT Yellow: Discuss with charge nurse and consider notifying provider and/or RRT  Notify: Charge Nurse/RN  Name of Charge Nurse/RN Notified  [facility] RN)  Al[facility] RN)  Provider Notification  Provider Name/Title  --  Delayne Solian MD  Date Provider Notified  --  09/21/24  Time Provider Notified  --  2103  Method of Notification  --  Page (secure chat)  Notification Reason  --   (patient is yellow mews due to increased resp. rate of 26 at 1939 139/63 75 26 98.9 95% repeat RR rate x1H 32 B: pt  here with sepsis PMH of lupus, CAD, CHF. patient states she has these episode at times she denies pain, SOB)  Provider response  --  No new orders  Date of Provider Response  --  09/21/24  Time of Provider Response  --  2130  Assess:  SIRS CRITERIA  SIRS Temperature  0 0  SIRS Respirations  1 1  SIRS Pulse 0 0  SIRS WBC 0 0  SIRS Score Sum  1 1

## 2024-09-21 NOTE — Progress Notes (Signed)
 PROGRESS NOTE    Hannah Pitts  FMW:[id number] DOB: [birth year] DOA: 09/20/2024 PCP: Cleatus Arlyss RAMAN, MD  Outpatient Specialists: cardiology    Brief Narrative:   From admission h and p  Hannah Pitts is a 78 y.o. female with medical history significant for Lupus on prn prednisone /plaquenil , PE 2019 s/p IVC filter , CAD s/p stent x 2 on 06/03/2024 on Plavix  and apixaban , diastolic CHF, orthostatic syncope being admitted with sepsis suspect secondary to UTI with CT abdomen pelvis showing concern for colonic mass and possible metastasis vs adenopathy.  She presented with several episodes of nausea and vomiting that started earlier in the day associated with abdominal pain and later development of chills and fevers.  Emesis was nonbloody and non-coffee-ground.  She denies change in bowel habits.  Denies dysuria.  No colonoscopy in the last 10 years. In the ED, Tmax 100.7 and tachycardic to 105, BP 106/75, fluid responsive to 129/81.  Labs notable for leukocytosis of 14,000 and lactic acid 2.2.  Lipase 53 and LFTs normal.  Urinalysis with moderate leukocytes9 point and squamous cells.  Hemoglobin 2, down from baseline 11.2 a couple months prior, creatinine 1.31 which is her baseline. Chest x-ray nonacute CT abdomen and pelvis with contrast showing a peritoneal mass adjacent to the mid transverse colon concerning for colon cancer with adjacent peritoneal metastases or adenopathy.  Recommendation for GI consult for colonoscopy and also soft tissue mass biopsy/diagnosis Patient started on Rocephin  and given a 1 L fluid bolus  Assessment & Plan:   Principal Problem:   Sepsis (HCC) Active Problems:   Urinary tract infection   Nausea and vomiting   Colonic mass on CT   History of non-Hodgkin's lymphoma   CAD S/P stent x 2 06/03/2024   Iron deficiency anemia due to chronic blood loss, suspected   History of pulmonary embolism   Presence of IVC filter   History of orthostatic syncope    Hypothyroidism   HYPERTENSION, BENIGN ESSENTIAL   HIATAL HERNIA   ERYTHEMATOSUS, LUPUS   LUPUS   Rheumatoid arthritis (HCC)   Obesity   Low back pain   COPD with acute bronchitis (HCC)   CKD stage 3b, GFR 30-44 ml/min (HCC)  # Peritoneal mass # History lymphoma Concerning for malignancy. With vomiting may be having some obstructive symptoms - continue clears - gi and gen surg consulted  # Fever Etiology unclear, no localizing symptoms. Nothing focal on cxr, no dysuria - f/u blood and urine cultures - check covid/flu/rsv - continue ceftriaxonef/flagyl   # Nausea/vomiting Appears resolved. Normal BM this morning per nursing. May have partial obstruction as above. - monitor for now  # CAD With nstemi in June of this year, 2 stents placed. Denies chest pain - dapt held, will touch base with cardiology - home statin  # Iron deficiency anemia No report of melena or other bleeding but has abdominal mass, iron deficiency anemia, hgb 7.9 today, 9s on presentation, was 12s earlier this year - trend for now, if stays below 8 will transfuse given cad - continue ppi for now though suspect lower source  # History DVT/PE - apixaban  on hold given slow bleed, possible procedure  # CKD 3a Stable - trend  # COPD Quiescent  # HTN  # Hypothyroid - home levothyroxine   # GAD - home lexapro   # History lupus/RA Not currently on meds   DVT prophylaxis: SCDs Code Status: dnr/dni Family Communication: daughter michelle updated telephonically 9/24. Lives with daughter kim  Level  of care: Telemetry Medical Status is: Observation    Consultants:  Gi, gen surg  Procedures: None thus far  Antimicrobials:  See above    Subjective: No complaints, no pain, no dysuria, no cough. No more vomiting. No diarrhea.  Objective: Vitals:   09/20/24 2217 09/20/24 2229 09/21/24 0445 09/21/24 0725  BP:  131/65 137/68 133/72  Pulse:  75 83 72  Resp:  (!) 26 20 18   Temp:  99.2 F  (37.3 C) 97.8 F (36.6 C) 98.4 F (36.9 C)  TempSrc:    Oral  SpO2:  96% 98% 96%  Weight:      Height: 5' 5 (1.651 m)       Intake/Output Summary (Last 24 hours) at 09/21/2024 0915 Last data filed at 09/21/2024 0311 Gross per 24 hour  Intake 564.49 ml  Output --  Net 564.49 ml   Filed Weights   09/20/24 1246 09/20/24 1541  Weight: 90.7 kg 90.7 kg    Examination:  General exam: Appears calm and comfortable  Respiratory system: Clear to auscultation. Respiratory effort normal. Cardiovascular system: S1 & S2 heard, RRR. Soft systolic murmur Gastrointestinal system: Abdomen is nondistended, soft and nontender.   Central nervous system: Alert and oriented. No focal neurological deficits. Very hard of hearing Extremities: Symmetric 5 x 5 power. Skin: No rashes, lesions or ulcers Psychiatry: Judgement and insight appear normal. Mood & affect appropriate.     Data Reviewed: I have personally reviewed following labs and imaging studies  CBC: Recent Labs  Lab 09/20/24 1249 09/21/24 0453  WBC 14.3* 14.2*  HGB 9.2* 7.9*  HCT 30.5* 26.0*  MCV 86.4 85.8  PLT 220 178   Basic Metabolic Panel: Recent Labs  Lab 09/20/24 1249 09/21/24 0453  NA 138 142  K 4.3 4.3  CL 104 106  CO2 22 24  GLUCOSE 164* 105*  BUN 29* 34*  CREATININE 1.31* 1.56*  CALCIUM  8.9 8.5*   GFR: Estimated Creatinine Clearance: 32.5 mL/min (A) (by C-G formula based on SCr of 1.56 mg/dL (H)). Liver Function Tests: Recent Labs  Lab 09/20/24 1249  AST 23  ALT 24  ALKPHOS 107  BILITOT 0.6  PROT 6.8  ALBUMIN 3.7   Recent Labs  Lab 09/20/24 1249  LIPASE 53*   No results for input(s): AMMONIA in the last 168 hours. Coagulation Profile: No results for input(s): INR, PROTIME in the last 168 hours. Cardiac Enzymes: No results for input(s): CKTOTAL, CKMB, CKMBINDEX, TROPONINI in the last 168 hours. BNP (last 3 results) No results for input(s): PROBNP in the last 8760  hours. HbA1C: No results for input(s): HGBA1C in the last 72 hours. CBG: No results for input(s): GLUCAP in the last 168 hours. Lipid Profile: No results for input(s): CHOL, HDL, LDLCALC, TRIG, CHOLHDL, LDLDIRECT in the last 72 hours. Thyroid  Function Tests: No results for input(s): TSH, T4TOTAL, FREET4, T3FREE, THYROIDAB in the last 72 hours. Anemia Panel: Recent Labs    09/20/24 1249 09/20/24 2305  VITAMINB12  --  352  FOLATE  --  >20.0  FERRITIN  --  9*  TIBC  --  410  IRON  --  <10*  RETICCTPCT 2.2  --    Urine analysis:    Component Value Date/Time   COLORURINE YELLOW (A) 09/20/2024 1942   APPEARANCEUR CLOUDY (A) 09/20/2024 1942   LABSPEC >1.046 (H) 09/20/2024 1942   PHURINE 5.0 09/20/2024 1942   GLUCOSEU NEGATIVE 09/20/2024 1942   HGBUR NEGATIVE 09/20/2024 1942   HGBUR negative 12/27/2010  0801   BILIRUBINUR NEGATIVE 09/20/2024 1942   BILIRUBINUR Neg 05/20/2013 1010   KETONESUR NEGATIVE 09/20/2024 1942   PROTEINUR 30 (A) 09/20/2024 1942   UROBILINOGEN negative 05/20/2013 1010   UROBILINOGEN 0.2 12/27/2010 0801   NITRITE NEGATIVE 09/20/2024 1942   LEUKOCYTESUR MODERATE (A) 09/20/2024 1942   Sepsis Labs: @LABRCNTIP (procalcitonin:4,lacticidven:4)  ) Recent Results (from the past 240 hours)  Blood culture (routine x 2)     Status: None (Preliminary result)   Collection Time: 09/20/24  3:40 PM   Specimen: BLOOD  Result Value Ref Range Status   Specimen Description BLOOD BLOOD RIGHT ARM  Final   Special Requests BAA Blood Culture adequate volume  Final   Culture   Final    NO GROWTH < 24 HOURS Performed at [facility], [address] [place], [place] [address]    Report Status PENDING  Incomplete  Blood culture (routine x 2)     Status: None (Preliminary result)   Collection Time: 09/20/24  9:32 PM   Specimen: BLOOD  Result Value Ref Range Status   Specimen Description BLOOD BLOOD LEFT ARM  Final   Special Requests    Final    BOTTLES DRAWN AEROBIC AND ANAEROBIC Blood Culture adequate volume   Culture   Final    NO GROWTH < 12 HOURS Performed at [facility], [address] [place], [place] [address]    Report Status PENDING  Incomplete         Radiology Studies: CT ABDOMEN PELVIS W CONTRAST Result Date: 09/20/2024 CLINICAL DATA:  Abdominal pain, nausea, vomiting EXAM: CT ABDOMEN AND PELVIS WITH CONTRAST TECHNIQUE: Multidetector CT imaging of the abdomen and pelvis was performed using the standard protocol following bolus administration of intravenous contrast. RADIATION DOSE REDUCTION: This exam was performed according to the departmental dose-optimization program which includes automated exposure control, adjustment of the mA and/or kV according to patient size and/or use of iterative reconstruction technique. CONTRAST:  80mL OMNIPAQUE  IOHEXOL  300 MG/ML  SOLN COMPARISON:  12/16/2017 FINDINGS: Lower chest: No acute findings. Coronary artery and aortic calcifications. Hepatobiliary: No focal liver abnormality is seen. Status post cholecystectomy. No biliary dilatation. Pancreas: No focal abnormality or ductal dilatation. Spleen: No focal abnormality.  Normal size. Adrenals/Urinary Tract: Adrenal glands normal. Small scattered bilateral subcentimeter renal cysts. No follow-up imaging recommended. 2 mm nonobstructing stones in the lower poles of both kidneys. No ureteral stones or hydronephrosis. Stomach/Bowel: Stomach and small bowel decompressed, unremarkable. 2.6 x 2.2 cm soft tissue nodule anteriorly adjacent to the mid transverse colon. The underlying: Wall may be mildly thickened. Cannot exclude transverse colon malignancy and adjacent spread or adenopathy. No bowel obstruction. Sigmoid diverticulosis. Vascular/Lymphatic: Aortic atherosclerosis. Other than the anterior soft tissue nodule adjacent to the transverse colon, no adenopathy seen. Reproductive: Prior hysterectomy.  No adnexal masses. Other:  No free fluid or free air. Musculoskeletal: No acute bony abnormality. IMPRESSION: 2.6 x 2.2 cm rounded soft tissue peritoneal mass anteriorly adjacent to the mid transverse colon. Underlying colon wall appears abnormal. Appearance is concerning for possible colon cancer with adjacent peritoneal metastasis or adenopathy. Recommend GI consultation for colonoscopy and/or soft tissue mass biopsy/diagnosis. Sigmoid diverticulosis.  No active diverticulitis. Aortic atherosclerosis. Electronically Signed   By: Franky Crease M.D.   On: 09/20/2024 18:05   DG Chest Portable 1 View Result Date: 09/20/2024 CLINICAL DATA:  Shortness of breath.  Nausea and vomiting. EXAM: PORTABLE CHEST 1 VIEW COMPARISON:  06/03/2024 FINDINGS: Shallow inspiration. Mild cardiac enlargement. No vascular  congestion, edema, or consolidation. Blunting of the costophrenic angles suggesting small pleural effusions. Probable mild atelectasis in the lung bases. No pneumothorax. Old rib fractures. Calcification of the aorta. IMPRESSION: 1. Cardiac enlargement. 2. Small bilateral pleural effusions with likely basilar atelectasis. No focal consolidation. Electronically Signed   By: Elsie Gravely M.D.   On: 09/20/2024 17:07        Scheduled Meds:  atorvastatin   40 mg Oral Daily   enoxaparin  (LOVENOX ) injection  0.5 mg/kg Subcutaneous Q24H   escitalopram   20 mg Oral Daily   levothyroxine   112 mcg Oral Q0600   pantoprazole  (PROTONIX ) IV  40 mg Intravenous Q24H   pramipexole   0.125 mg Oral QHS   Continuous Infusions:  cefTRIAXone  (ROCEPHIN )  IV     lactated ringers  150 mL/hr (09/21/24 0706)   metronidazole  500 mg (09/21/24 0910)   promethazine (PHENERGAN) injection (IM or IVPB)       LOS: 0 days     Devaughn KATHEE Ban, MD [facility]   If 7PM-7AM, please contact night-coverage [email or web address] Password [email or web address] 09/21/2024, 9:15 AM

## 2024-09-21 NOTE — Consult Note (Signed)
 Consultation  Referring Provider:   Hospitalist   Admit date: 09/20/2024 Consult date        09/21/2024 Reason for Consultation:     Abnormal Imaging         HPI:   Hannah Pitts is a 78 y.o. lady with history of lupus, recent DES placement for CAD (June), a. Fib, and obesity here with concern for sepsis secondary to UTI. CT scan noted possible mass in the colon. Her last colonoscopy was many years ago. She takes eliquis  and plavix  with last doses Monday evening. She has a history of lymphoma.   Past Medical History:  Diagnosis Date   Abnormal involuntary movements(781.0)    Anemia of other chronic disease    Depressive disorder, not elsewhere classified    Diaphragmatic hernia without mention of obstruction or gangrene    Dysthymic disorder    Generalized hyperhidrosis    GERD (gastroesophageal reflux disease)    H/O hiatal hernia    History of pulmonary embolus (PE)    Lymphoma (HCC) 03/2010   they got it all w/surgery   Meniere's disease of left ear    Migraine    used to have classic kind; not anymore; last one was in 2012   Other rheumatoid arthritis with visceral or systemic involvement    Palpitations    Personal history of other disorders of nervous system and sense organs    Systemic lupus erythematosus (HCC)    Unspecified disease of pericardium    Pericarditis    Unspecified hypothyroidism    Urticaria, unspecified     Past Surgical History:  Procedure Laterality Date   ABDOMINAL HYSTERECTOMY  1978   APPENDECTOMY  1974   BREAST BIOPSY  03/2010   left breast;    BREAST LUMPECTOMY  03/2010   left breast; nonHodgkins lymphoma; not breast cancer   CHOLECYSTECTOMY  1974   CORONARY STENT INTERVENTION N/A 06/03/2024   Procedure: CORONARY STENT INTERVENTION;  Surgeon: Swaziland, Peter M, MD;  Location: [facility];  Service: Cardiovascular;  Laterality: N/A;   ESOPHAGEAL DILATION  ?02/2011   ESOPHAGOGASTRODUODENOSCOPY  01-24-11   Slight stenosis esoph spasm    IR IVC FILTER PLMT / S&I /IMG GUID/MOD SED  12/17/2017   LEFT HEART CATH AND CORONARY ANGIOGRAPHY N/A 06/03/2024   Procedure: LEFT HEART CATH AND CORONARY ANGIOGRAPHY;  Surgeon: Swaziland, Peter M, MD;  Location: [facility] INVASIVE CV LAB;  Service: Cardiovascular;  Laterality: N/A;   Left VATS  12/1997   LUMBAR DISC SURGERY  1977   shunt     put in behind left ear ; Menier's syndrome    Family History  Problem Relation Age of Onset   Heart failure Mother    Stroke Father    Coronary artery disease Sister    COPD Sister    Coronary artery disease Brother    Diabetes Brother    Heart failure Brother    Coronary artery disease Sister    Coronary artery disease Sister    Cancer Sister    Heart failure Sister    Cancer Sister        lung, esoph   Cancer Sister        lung   Breast cancer Maternal Aunt    Colon cancer Neg Hx      Social History   Tobacco Use   Smoking status: Never   Smokeless tobacco: Never  Vaping Use   Vaping status: Never Used  Substance Use Topics  Alcohol  use: No   Drug use: No    Prior to Admission medications   Medication Sig Start Date End Date Taking? Authorizing Provider  acetaminophen  (TYLENOL ) 500 MG tablet Take 500 mg by mouth every 6 (six) hours as needed.   Yes [provider]  apixaban  (ELIQUIS ) 5 MG TABS tablet Take 1 tablet (5 mg total) by mouth 2 (two) times daily. 08/08/24  Yes Cleatus Arlyss RAMAN, MD  atorvastatin  (LIPITOR) 40 MG tablet Take 1 tablet (40 mg total) by mouth daily. 08/08/24  Yes Cleatus Arlyss RAMAN, MD  clopidogrel  (PLAVIX ) 75 MG tablet Take 1 tablet (75 mg total) by mouth daily with breakfast. 08/08/24  Yes Cleatus Arlyss RAMAN, MD  diphenhydrAMINE  HCl 12.5 MG TBDP Take 12.5-25 mg by mouth every 6 (six) hours as needed for itching.   Yes [provider]  escitalopram  (LEXAPRO ) 20 MG tablet Take 1 tablet (20 mg total) by mouth daily. 05/20/24  Yes Cleatus Arlyss RAMAN, MD  levothyroxine  (SYNTHROID ) 112 MCG tablet TAKE 1 TABLET  (112 MCG TOTAL) BY MOUTH DAILY BEFORE BREAKFAST. EXCEPT SKIP SUNDAY. 6 TABS A WEEK. 07/13/24  Yes Cleatus Arlyss RAMAN, MD  nitroGLYCERIN  (NITROSTAT ) 0.4 MG SL tablet Place 1 tablet (0.4 mg total) under the tongue every 5 (five) minutes x 3 doses as needed for chest pain. 06/04/24  Yes Vicci Rollo SAUNDERS, PA-C  pramipexole  (MIRAPEX ) 0.125 MG tablet TAKE 1 TABLET BY MOUTH AT BEDTIME. TO REPLACE REQUIP  04/12/24  Yes Cleatus Arlyss RAMAN, MD  nystatin  (MYCOSTATIN /NYSTOP ) powder Apply 1 Application topically 2 (two) times daily as needed. 06/09/24   Cleatus Arlyss RAMAN, MD  predniSONE  (DELTASONE ) 10 MG tablet 10mg  daily with food. Don't take with aleve/ibuprofen . If better, try cutting back to 1/2 tab a day. Patient not taking: Reported on 09/20/2024 08/08/24   Cleatus Arlyss RAMAN, MD    Current Facility-Administered Medications  Medication Dose Route Frequency Provider Last Rate Last Admin   acetaminophen  (TYLENOL ) tablet 650 mg  650 mg Oral Q6H PRN Duncan, Hazel V, MD       Or   acetaminophen  (TYLENOL ) suppository 650 mg  650 mg Rectal Q6H PRN Duncan, Hazel V, MD       artificial tears ophthalmic solution 1 drop  1 drop Both Eyes BID PRN Wouk, Devaughn Sayres, MD       atorvastatin  (LIPITOR) tablet 40 mg  40 mg Oral Daily Duncan, Hazel V, MD   40 mg at 09/21/24 0908   cefTRIAXone  (ROCEPHIN ) 2 g in sodium chloride  0.9 % 100 mL IVPB  2 g Intravenous Q24H Duncan, Hazel V, MD       diphenhydrAMINE  (BENADRYL ) capsule 25 mg  25 mg Oral Q6H PRN Cleatus Delayne GAILS, MD       enoxaparin  (LOVENOX ) injection 45 mg  0.5 mg/kg Subcutaneous Q24H Duncan, Hazel V, MD   45 mg at 09/20/24 2247   escitalopram  (LEXAPRO ) tablet 20 mg  20 mg Oral Daily Duncan, Hazel V, MD   20 mg at 09/21/24 0908   HYDROcodone -acetaminophen  (NORCO/VICODIN) 5-325 MG per tablet 1-2 tablet  1-2 tablet Oral Q4H PRN Duncan, Hazel V, MD       levothyroxine  (SYNTHROID ) tablet 112 mcg  112 mcg Oral Q0600 Cleatus Delayne GAILS, MD   112 mcg at 09/21/24 0617   metroNIDAZOLE   (FLAGYL ) IVPB 500 mg  500 mg Intravenous Q12H Duncan, Hazel V, MD 100 mL/hr at 09/21/24 0910 500 mg at 09/21/24 0910   nitroGLYCERIN  (NITROSTAT ) SL tablet 0.4 mg  0.4 mg Sublingual Q5 Min x 3 PRN Cleatus Delayne GAILS, MD       nystatin  (MYCOSTATIN /NYSTOP ) topical powder 1 Application  1 Application Topical BID PRN Cleatus Delayne GAILS, MD       ondansetron  (ZOFRAN ) tablet 4 mg  4 mg Oral Q6H PRN Duncan, Hazel V, MD       Or   ondansetron  (ZOFRAN ) injection 4 mg  4 mg Intravenous Q6H PRN Duncan, Hazel V, MD       pantoprazole  (PROTONIX ) injection 40 mg  40 mg Intravenous Q24H Duncan, Hazel V, MD   40 mg at 09/20/24 2330   pramipexole  (MIRAPEX ) tablet 0.125 mg  0.125 mg Oral QHS Duncan, Hazel V, MD       promethazine (PHENERGAN) 12.5 mg in sodium chloride  0.9 % 50 mL IVPB  12.5 mg Intravenous Q6H PRN Cleatus Delayne GAILS, MD        Allergies as of 09/20/2024 - Review Complete 09/20/2024  Allergen Reaction Noted   Heparin  Other (See Comments) 12/28/2017     Review of Systems:    All systems reviewed and negative except where noted in HPI.  Review of Systems  Constitutional:  Negative for chills and fever.  Respiratory:  Negative for shortness of breath.   Cardiovascular:  Positive for leg swelling.  Gastrointestinal:  Negative for constipation.  Skin:  Negative for rash.  Neurological:  Negative for focal weakness.  Psychiatric/Behavioral:  Negative for substance abuse.   All other systems reviewed and are negative.      Physical Exam:  Vital signs in last 24 hours: Temp:  [97.8 F (36.6 C)-99.2 F (37.3 C)] 98.9 F (37.2 C) (09/24 1939) Pulse Rate:  [69-83] 75 (09/24 1939) Resp:  [16-26] 26 (09/24 1939) BP: (121-138)/(63-81) 132/63 (09/24 1939) SpO2:  [95 %-98 %] 95 % (09/24 1939) Last BM Date : 09/21/24 General:   Pleasant in NAD Head:  Normocephalic and atraumatic. Eyes:   No icterus.   Conjunctiva pink. Ears:  Normal auditory acuity. Mouth: Mucosa pink moist, no lesions. Neck:   Supple; no masses felt Lungs: No respiratory distress Abdomen:   Flat, soft, nondistended, nontende Msk:  Strength 5/5. Symmetrical without gross deformities. Neurologic:  Alert and  oriented x4;  No focal deficits Skin:  Warm, dry, pink without significant lesions or rashes. Psych:  Alert and cooperative. Normal affect.  LAB RESULTS: Recent Labs    09/20/24 1249 09/21/24 0453  WBC 14.3* 14.2*  HGB 9.2* 7.9*  HCT 30.5* 26.0*  PLT 220 178   BMET Recent Labs    09/20/24 1249 09/21/24 0453  NA 138 142  K 4.3 4.3  CL 104 106  CO2 22 24  GLUCOSE 164* 105*  BUN 29* 34*  CREATININE 1.31* 1.56*  CALCIUM  8.9 8.5*   LFT Recent Labs    09/20/24 1249  PROT 6.8  ALBUMIN 3.7  AST 23  ALT 24  ALKPHOS 107  BILITOT 0.6   PT/INR No results for input(s): LABPROT, INR in the last 72 hours.  STUDIES: CT ABDOMEN PELVIS W CONTRAST Result Date: 09/20/2024 CLINICAL DATA:  Abdominal pain, nausea, vomiting EXAM: CT ABDOMEN AND PELVIS WITH CONTRAST TECHNIQUE: Multidetector CT imaging of the abdomen and pelvis was performed using the standard protocol following bolus administration of intravenous contrast. RADIATION DOSE REDUCTION: This exam was performed according to the departmental dose-optimization program which includes automated exposure control, adjustment of the mA and/or kV according to patient size and/or use of iterative reconstruction technique. CONTRAST:  80mL OMNIPAQUE  IOHEXOL  300 MG/ML  SOLN COMPARISON:  12/16/2017 FINDINGS: Lower chest: No acute findings. Coronary artery and aortic calcifications. Hepatobiliary: No focal liver abnormality is seen. Status post cholecystectomy. No biliary dilatation. Pancreas: No focal abnormality or ductal dilatation. Spleen: No focal abnormality.  Normal size. Adrenals/Urinary Tract: Adrenal glands normal. Small scattered bilateral subcentimeter renal cysts. No follow-up imaging recommended. 2 mm nonobstructing stones in the lower poles of both  kidneys. No ureteral stones or hydronephrosis. Stomach/Bowel: Stomach and small bowel decompressed, unremarkable. 2.6 x 2.2 cm soft tissue nodule anteriorly adjacent to the mid transverse colon. The underlying: Wall may be mildly thickened. Cannot exclude transverse colon malignancy and adjacent spread or adenopathy. No bowel obstruction. Sigmoid diverticulosis. Vascular/Lymphatic: Aortic atherosclerosis. Other than the anterior soft tissue nodule adjacent to the transverse colon, no adenopathy seen. Reproductive: Prior hysterectomy.  No adnexal masses. Other: No free fluid or free air. Musculoskeletal: No acute bony abnormality. IMPRESSION: 2.6 x 2.2 cm rounded soft tissue peritoneal mass anteriorly adjacent to the mid transverse colon. Underlying colon wall appears abnormal. Appearance is concerning for possible colon cancer with adjacent peritoneal metastasis or adenopathy. Recommend GI consultation for colonoscopy and/or soft tissue mass biopsy/diagnosis. Sigmoid diverticulosis.  No active diverticulitis. Aortic atherosclerosis. Electronically Signed   By: Franky Crease M.D.   On: 09/20/2024 18:05   DG Chest Portable 1 View Result Date: 09/20/2024 CLINICAL DATA:  Shortness of breath.  Nausea and vomiting. EXAM: PORTABLE CHEST 1 VIEW COMPARISON:  06/03/2024 FINDINGS: Shallow inspiration. Mild cardiac enlargement. No vascular congestion, edema, or consolidation. Blunting of the costophrenic angles suggesting small pleural effusions. Probable mild atelectasis in the lung bases. No pneumothorax. Old rib fractures. Calcification of the aorta. IMPRESSION: 1. Cardiac enlargement. 2. Small bilateral pleural effusions with likely basilar atelectasis. No focal consolidation. Electronically Signed   By: Elsie Gravely M.D.   On: 09/20/2024 17:07       Impression / Plan:   78 y.o. lady with history of lupus, recent DES placement for CAD (June), a. Fib, and obesity here with concern for sepsis secondary to UTI  with incidentally found colon mass on imaging.  - clear liquids for now - will need to let blood thinners wash out some, at least DOAC given recent stent placement for CAD - will plan on colonoscopy, possibly on Friday  Please call with any questions or concerns.  Dr. Jinny will resume care tomorrow

## 2024-09-21 NOTE — Consult Note (Incomplete)
 [facility] SURGICAL CONSULTATION NOTE (initial) - cpt: 9925***1/2/3/4/5    HISTORY OF PRESENT ILLNESS (HPI):  78 y.o. female presented to [facility] ED ***today for evaluation of ***. Patient reports ***  Surgery is consulted by *** physician Dr. PIERRETTE in this context for evaluation and management*** of ***.  PAST MEDICAL HISTORY (PMH):  Past Medical History:  Diagnosis Date  . Abnormal involuntary movements(781.0)   . Anemia of other chronic disease   . Depressive disorder, not elsewhere classified   . Diaphragmatic hernia without mention of obstruction or gangrene   . Dysthymic disorder   . Generalized hyperhidrosis   . GERD (gastroesophageal reflux disease)   . H/O hiatal hernia   . History of pulmonary embolus (PE)   . Lymphoma (HCC) 03/2010   they got it all w/surgery  . Meniere's disease of left ear   . Migraine    used to have classic kind; not anymore; last one was in 2012  . Other rheumatoid arthritis with visceral or systemic involvement   . Palpitations   . Personal history of other disorders of nervous system and sense organs   . Systemic lupus erythematosus (HCC)   . Unspecified disease of pericardium    Pericarditis   . Unspecified hypothyroidism   . Urticaria, unspecified      PAST SURGICAL HISTORY (PSH):  Past Surgical History:  Procedure Laterality Date  . ABDOMINAL HYSTERECTOMY  1978  . APPENDECTOMY  1974  . BREAST BIOPSY  03/2010   left breast;   . BREAST LUMPECTOMY  03/2010   left breast; nonHodgkins lymphoma; not breast cancer  . CHOLECYSTECTOMY  1974  . CORONARY STENT INTERVENTION N/A 06/03/2024   Procedure: CORONARY STENT INTERVENTION;  Surgeon: Swaziland, Peter M, MD;  Location: [facility] INVASIVE CV LAB;  Service: Cardiovascular;  Laterality: N/A;  . ESOPHAGEAL DILATION  ?02/2011  . ESOPHAGOGASTRODUODENOSCOPY  01-24-11   Slight stenosis esoph spasm  . IR IVC FILTER PLMT / S&I /IMG GUID/MOD SED  12/17/2017  . LEFT HEART CATH AND CORONARY  ANGIOGRAPHY N/A 06/03/2024   Procedure: LEFT HEART CATH AND CORONARY ANGIOGRAPHY;  Surgeon: Swaziland, Peter M, MD;  Location: [facility] INVASIVE CV LAB;  Service: Cardiovascular;  Laterality: N/A;  . Left VATS  12/1997  . LUMBAR DISC SURGERY  1977  . shunt     put in behind left ear ; Menier's syndrome     MEDICATIONS:  Prior to Admission medications   Medication Sig Start Date End Date Taking? Authorizing Provider  acetaminophen  (TYLENOL ) 500 MG tablet Take 500 mg by mouth every 6 (six) hours as needed.   Yes [provider]  apixaban  (ELIQUIS ) 5 MG TABS tablet Take 1 tablet (5 mg total) by mouth 2 (two) times daily. 08/08/24  Yes Cleatus Arlyss RAMAN, MD  atorvastatin  (LIPITOR) 40 MG tablet Take 1 tablet (40 mg total) by mouth daily. 08/08/24  Yes Cleatus Arlyss RAMAN, MD  clopidogrel  (PLAVIX ) 75 MG tablet Take 1 tablet (75 mg total) by mouth daily with breakfast. 08/08/24  Yes Cleatus Arlyss RAMAN, MD  diphenhydrAMINE  HCl 12.5 MG TBDP Take 12.5-25 mg by mouth every 6 (six) hours as needed for itching.   Yes [provider]  escitalopram  (LEXAPRO ) 20 MG tablet Take 1 tablet (20 mg total) by mouth daily. 05/20/24  Yes Cleatus Arlyss RAMAN, MD  levothyroxine  (SYNTHROID ) 112 MCG tablet TAKE 1 TABLET (112 MCG TOTAL) BY MOUTH DAILY BEFORE BREAKFAST. EXCEPT SKIP SUNDAY. 6 TABS A WEEK. 07/13/24  Yes Cleatus Arlyss  S, MD  nitroGLYCERIN  (NITROSTAT ) 0.4 MG SL tablet Place 1 tablet (0.4 mg total) under the tongue every 5 (five) minutes x 3 doses as needed for chest pain. 06/04/24  Yes Vicci Rollo SAUNDERS, PA-C  pramipexole  (MIRAPEX ) 0.125 MG tablet TAKE 1 TABLET BY MOUTH AT BEDTIME. TO REPLACE REQUIP  04/12/24  Yes Cleatus Arlyss RAMAN, MD  nystatin  (MYCOSTATIN /NYSTOP ) powder Apply 1 Application topically 2 (two) times daily as needed. 06/09/24   Cleatus Arlyss RAMAN, MD  predniSONE  (DELTASONE ) 10 MG tablet 10mg  daily with food. Don't take with aleve/ibuprofen . If better, try cutting back to 1/2 tab a day. Patient not taking:  Reported on 09/20/2024 08/08/24   Cleatus Arlyss RAMAN, MD     ALLERGIES:  Allergies  Allergen Reactions  . Heparin  Other (See Comments)    Bruising/bleeding     SOCIAL HISTORY:  Social History   Socioeconomic History  . Marital status: Widowed    Spouse name: Not on file  . Number of children: 2  . Years of education: Not on file  . Highest education level: Not on file  Occupational History  . Occupation: Disabled     Employer: RETIRED  Tobacco Use  . Smoking status: Never  . Smokeless tobacco: Never  Vaping Use  . Vaping status: Never Used  Substance and Sexual Activity  . Alcohol  use: No  . Drug use: No  . Sexual activity: Never  Other Topics Concern  . Not on file  Social History Narrative   Ms. Perkey lives in [place], [place].    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    Social Drivers of Corporate investment banker Strain: Low Risk  (04/07/2024)   Overall Financial Resource Strain (CARDIA)   . Difficulty of Paying Living Expenses: Not hard at all  Food Insecurity: No Food Insecurity (09/20/2024)   Hunger Vital Sign   . Worried About Programme researcher, broadcasting/film/video in the Last Year: Never true   . Ran Out of Food in the Last Year: Never true  Transportation Needs: No Transportation Needs (09/20/2024)   PRAPARE - Transportation   . Lack of Transportation (Medical): No   . Lack of Transportation (Non-Medical): No  Physical Activity: Insufficiently Active (04/07/2024)   Exercise Vital Sign   . Days of Exercise per Week: 3 days   . Minutes of Exercise per Session: 30 min  Stress: No Stress Concern Present (04/07/2024)   [employer] of Occupational Health - Occupational Stress Questionnaire   . Feeling of Stress : Not at all  Social Connections: Moderately Isolated (09/20/2024)   Social Connection and  Isolation Panel   . Frequency of Communication with Friends and Family: More than three times a week   . Frequency of Social Gatherings with Friends and Family: More than three times a week   . Attends Religious Services: More than 4 times per year   . Active Member of Clubs or Organizations: No   . Attends Banker Meetings: Never   . Marital Status: Widowed  Intimate Partner Violence: Not At Risk (09/20/2024)   Humiliation, Afraid, Rape, and Kick questionnaire   . Fear of Current or Ex-Partner: No   . Emotionally Abused: No   . Physically Abused: No   . Sexually Abused: No     FAMILY HISTORY:  Family History  Problem Relation Age of Onset  . Heart failure Mother   . Stroke Father   . Coronary artery disease Sister   . COPD Sister   . Coronary artery disease Brother   . Diabetes Brother   . Heart failure Brother   . Coronary artery disease Sister   . Coronary artery disease Sister   . Cancer Sister   . Heart failure Sister   . Cancer Sister        lung, esoph  . Cancer Sister        lung  . Breast cancer Maternal Aunt   . Colon cancer Neg Hx       REVIEW OF SYSTEMS:  ROS  VITAL SIGNS:  Temp:  [97.8 F (36.6 C)-99.2 F (37.3 C)] 98.9 F (37.2 C) (09/24 1646) Pulse Rate:  [69-83] 77 (09/24 1646) Resp:  [16-26] 17 (09/24 1646) BP: (121-138)/(65-81) 138/76 (09/24 1646) SpO2:  [95 %-98 %] 97 % (09/24 1646)     Height: 5' 5 (165.1 cm) Weight: 90.7 kg BMI (Calculated): 33.28   INTAKE/OUTPUT:  09/23 0701 - 09/24 0700 In: 564.5 [I.V.:464.5; IV Piggyback:100] Out: -   PHYSICAL EXAM:  Physical Exam Blood pressure 138/76, pulse 77, temperature 98.9 F (37.2 C), temperature source Oral, resp. rate 17, height 5' 5 (1.651 m), weight 90.7 kg, SpO2 97%. Last Weight  Most recent update: 09/20/2024  3:41 PM    Weight  90.7 kg (200 lb)             CONSTITUTIONAL: Well developed, and nourished, appropriately responsive and aware without distress. ***   EYES: Sclera non-icteric.   EARS, NOSE, MOUTH AND THROAT: Mask worn.  *** The oropharynx is clear. Oral mucosa is pink and moist.  Dentition: ***   Hearing is intact to voice.  NECK: Trachea is midline, and there is no jugular venous distension.  LYMPH NODES:  Lymph nodes in the neck are not enlarged. RESPIRATORY:  Lungs are clear, and breath sounds are equal bilaterally.  *** Normal respiratory effort without pathologic use of accessory muscles. CARDIOVASCULAR: Heart is regular in rate and rhythm.  *** Well perfused.  GI: The abdomen is *** soft, nontender, and nondistended. There were no palpable masses. I did not appreciate hepatosplenomegaly. There were normal bowel sounds. GU: *** MUSCULOSKELETAL:  Symmetrical muscle tone appreciated in all four extremities.*** Warm without edema.  SKIN: Skin turgor is normal. No pathologic skin lesions appreciated.  NEUROLOGIC:  Motor and sensation appear grossly normal.  Cranial nerves are grossly without defect. PSYCH:  Alert and oriented to person, place and time. Affect is appropriate for situation.  Data Reviewed I have personally reviewed what is currently available of the patient's imaging, recent labs and medical records.    Labs:     Latest Ref Rng & Units 09/21/2024    4:53 AM 09/20/2024   12:49 PM 07/04/2024   11:43 AM  CBC  WBC 4.0 - 10.5 K/uL 14.2  14.3  4.5   Hemoglobin 12.0 - 15.0 g/dL 7.9  9.2  88.7   Hematocrit 36.0 - 46.0 % 26.0  30.5  35.9   Platelets 150 - 400 K/uL 178  220  160       Latest Ref Rng & Units 09/21/2024    4:53 AM 09/20/2024   12:49 PM 08/08/2024    2:54 PM  CMP  Glucose 70 - 99 mg/dL 894  835    BUN 8 - 23 mg/dL 34  29  Creatinine 0.44 - 1.00 mg/dL 8.43  8.68    Sodium 864 - 145 mmol/L 142  138    Potassium 3.5 - 5.1 mmol/L 4.3  4.3    Chloride 98 - 111 mmol/L 106  104    CO2 22 - 32 mmol/L 24  22    Calcium  8.9 - 10.3 mg/dL 8.5  8.9    Total Protein 6.5 - 8.1 g/dL  6.8  6.5   Total Bilirubin 0.0 - 1.2  mg/dL  0.6  0.5   Alkaline Phos 38 - 126 U/L  107  88   AST 15 - 41 U/L  23  21   ALT 0 - 44 U/L  24  25      Imaging studies:  {Labs :18171} Last 24 hrs: CT ABDOMEN PELVIS W CONTRAST Result Date: 09/20/2024 CLINICAL DATA:  Abdominal pain, nausea, vomiting EXAM: CT ABDOMEN AND PELVIS WITH CONTRAST TECHNIQUE: Multidetector CT imaging of the abdomen and pelvis was performed using the standard protocol following bolus administration of intravenous contrast. RADIATION DOSE REDUCTION: This exam was performed according to the departmental dose-optimization program which includes automated exposure control, adjustment of the mA and/or kV according to patient size and/or use of iterative reconstruction technique. CONTRAST:  80mL OMNIPAQUE  IOHEXOL  300 MG/ML  SOLN COMPARISON:  12/16/2017 FINDINGS: Lower chest: No acute findings. Coronary artery and aortic calcifications. Hepatobiliary: No focal liver abnormality is seen. Status post cholecystectomy. No biliary dilatation. Pancreas: No focal abnormality or ductal dilatation. Spleen: No focal abnormality.  Normal size. Adrenals/Urinary Tract: Adrenal glands normal. Small scattered bilateral subcentimeter renal cysts. No follow-up imaging recommended. 2 mm nonobstructing stones in the lower poles of both kidneys. No ureteral stones or hydronephrosis. Stomach/Bowel: Stomach and small bowel decompressed, unremarkable. 2.6 x 2.2 cm soft tissue nodule anteriorly adjacent to the mid transverse colon. The underlying: Wall may be mildly thickened. Cannot exclude transverse colon malignancy and adjacent spread or adenopathy. No bowel obstruction. Sigmoid diverticulosis. Vascular/Lymphatic: Aortic atherosclerosis. Other than the anterior soft tissue nodule adjacent to the transverse colon, no adenopathy seen. Reproductive: Prior hysterectomy.  No adnexal masses. Other: No free fluid or free air. Musculoskeletal: No acute bony abnormality. IMPRESSION: 2.6 x 2.2 cm rounded soft  tissue peritoneal mass anteriorly adjacent to the mid transverse colon. Underlying colon wall appears abnormal. Appearance is concerning for possible colon cancer with adjacent peritoneal metastasis or adenopathy. Recommend GI consultation for colonoscopy and/or soft tissue mass biopsy/diagnosis. Sigmoid diverticulosis.  No active diverticulitis. Aortic atherosclerosis. Electronically Signed   By: Franky Crease M.D.   On: 09/20/2024 18:05     Assessment/Plan:  78 y.o. female with ***, complicated by pertinent comorbidities including ***.  Patient Active Problem List   Diagnosis Date Noted  . CKD stage 3b, GFR 30-44 ml/min (HCC) 09/21/2024  . Sepsis (HCC) 09/20/2024  . Iron deficiency anemia due to chronic blood loss, suspected 09/20/2024  . Colonic mass on CT 09/20/2024  . History of non-Hodgkin's lymphoma 09/20/2024  . Nausea and vomiting 09/20/2024  . History of orthostatic syncope 09/20/2024  . CAD S/P stent x 2 06/03/2024 06/11/2024  . NSTEMI (non-ST elevated myocardial infarction) (HCC) 06/03/2024  . Back pain 12/17/2023  . Syncope 12/17/2023  . Healthcare maintenance 04/08/2023  . Depression, recurrent 04/08/2023  . RLS (restless legs syndrome) 04/03/2022  . Presence of IVC filter 04/03/2022  . Change in stool 04/03/2022  . Acute kidney injury superimposed on chronic kidney disease 04/18/2021  . History of DVT (deep vein thrombosis) 04/18/2021  .  History of pulmonary embolism 04/18/2021  . Thrombocytopenia 04/18/2021  . Elevated serum creatinine 01/20/2018  . Pulmonary embolism (HCC) 12/12/2017  . Congestive heart failure (CHF) (HCC) 12/12/2017  . Obesity hypoventilation syndrome (HCC) 12/12/2017  . Fall at home 08/05/2016  . COPD with acute bronchitis (HCC) 12/12/2014  . Hard of hearing 12/12/2014  . Low back pain 10/11/2014  . Advance care planning 07/31/2014  . Abnormality of gait 07/31/2014  . Dyspnea on exertion 07/20/2013  . Obesity 07/20/2013  . Medicare annual  wellness visit, subsequent 07/27/2012  . Palpitations 03/23/2012  . Anxiety 06/10/2011  . Urinary tract infection 06/05/2011  . RASH AND OTHER NONSPECIFIC SKIN ERUPTION 09/13/2010  . Dysphagia 03/12/2010  . LUPUS 09/25/2009  . SKIN LESION 09/21/2007  . Tremor 05/28/2007  . Hypothyroidism 05/27/2007  . PERIPHERAL VASCULAR DISEASE 05/27/2007  . GERD 05/27/2007  . HIATAL HERNIA 05/27/2007  . ERYTHEMATOSUS, LUPUS 05/27/2007  . Rheumatoid arthritis (HCC) 05/27/2007  . MENIERE'S DISEASE, HX OF 05/27/2007  . HYPERTENSION, BENIGN ESSENTIAL 05/20/2007    - ***   - ***   - ***   - DVT prophylaxis  All of the above findings and recommendations were discussed with the patient ***and *** family(if present), and all of patient's ***and present family's questions were answered to their expressed satisfaction.  I personally spent a total of *** minutes in the care of the patient today including {Time Based Coding:210964241}.    Thank you for the opportunity to participate in this patient's care.   -- Honor Leghorn, M.D., FACS 09/21/2024, 5:22 PM

## 2024-09-22 DIAGNOSIS — K6389 Other specified diseases of intestine: Secondary | ICD-10-CM | POA: Diagnosis not present

## 2024-09-22 LAB — GASTROINTESTINAL PANEL BY PCR, STOOL (REPLACES STOOL CULTURE)

## 2024-09-22 LAB — C DIFFICILE QUICK SCREEN W PCR REFLEX
C Diff antigen: NEGATIVE
C Diff interpretation: NOT DETECTED
C Diff toxin: NEGATIVE

## 2024-09-22 LAB — CBC
HCT: 23.6 % — ABNORMAL LOW (ref 36.0–46.0)
Hemoglobin: 7.2 g/dL — ABNORMAL LOW (ref 12.0–15.0)
MCH: 25.8 pg — ABNORMAL LOW (ref 26.0–34.0)
MCHC: 30.5 g/dL (ref 30.0–36.0)
MCV: 84.6 fL (ref 80.0–100.0)
Platelets: 145 K/uL — ABNORMAL LOW (ref 150–400)
RBC: 2.79 MIL/uL — ABNORMAL LOW (ref 3.87–5.11)
RDW: 13.8 % (ref 11.5–15.5)
WBC: 4.7 K/uL (ref 4.0–10.5)
nRBC: 0 % (ref 0.0–0.2)

## 2024-09-22 LAB — URINE CULTURE

## 2024-09-22 LAB — PREPARE RBC (CROSSMATCH)

## 2024-09-22 MED ORDER — PEG 3350-KCL-NA BICARB-NACL 420 G PO SOLR
4000.0000 mL | Freq: Once | ORAL | Status: AC
  Administered 2024-09-22: 4000 mL via ORAL
  Filled 2024-09-22: qty 4000

## 2024-09-22 MED ORDER — SODIUM CHLORIDE 0.9 % IV SOLN: INTRAVENOUS | Status: DC

## 2024-09-22 MED ORDER — SODIUM CHLORIDE 0.9% IV SOLUTION
Freq: Once | INTRAVENOUS | Status: DC
Start: 2024-09-22 — End: 2024-09-23

## 2024-09-22 NOTE — Progress Notes (Signed)
 Central tele called to notify RN of 9 beat run of Vtach.  Pt asymptomatic.  MD aware.

## 2024-09-22 NOTE — Progress Notes (Signed)
 Blood transfusing at 131ml/hr.  Pt called out to report swelling in IV area.  IV infiltrated.  Blood transfusion stopped and stat IV team consult placed.

## 2024-09-22 NOTE — Progress Notes (Signed)
 Mobility Specialist - Progress Note   09/22/24 1220  Therapy Vitals  Temp 98.7 F (37.1 C)  Temp Source Oral  Pulse Rate 79  Resp 19  BP 136/81  Patient Position (if appropriate) Sitting  Oxygen  Therapy  SpO2 100 %  O2 Device Room Air  Mobility  Activity Ambulated with assistance  Level of Assistance Contact guard assist, steadying assist  Assistive Device Front wheel walker  Distance Ambulated (ft) 125 ft  Activity Response Tolerated well  Mobility visit 1 Mobility  Mobility Specialist Start Time (ACUTE ONLY) 1204  Mobility Specialist Stop Time (ACUTE ONLY) 1219  Mobility Specialist Time Calculation (min) (ACUTE ONLY) 15 min   Pt was supine in the bed with a guest upon entry. Pt agreed to mobility. Pt was able to EOB independently. Pt was able to STS independently. Pt stated weak legs so CGA for ambulation. Pt ambulated well and one recovery moment for O2 check halfway. Pt O2 was 96%. After mobility ankle flex and leg extensions were added. After activity pt was EOB with needs in reach. Nurse entered room to take vitals.  Clem Rodes Mobility Specialist 09/22/24, 12:28 PM

## 2024-09-22 NOTE — Plan of Care (Signed)

## 2024-09-22 NOTE — Progress Notes (Signed)
 PROGRESS NOTE    Hannah Pitts  FMW:[id number] DOB: Apr 02, 1946 DOA: 09/20/2024 PCP: Cleatus Arlyss RAMAN, MD  Outpatient Specialists: cardiology    Brief Narrative:   From admission h and p  Hannah Pitts is a 78 y.o. female with medical history significant for Lupus on prn prednisone /plaquenil , PE 2019 s/p IVC filter , CAD s/p stent x 2 on 06/03/2024 on Plavix  and apixaban , diastolic CHF, orthostatic syncope being admitted with sepsis suspect secondary to UTI with CT abdomen pelvis showing concern for colonic mass and possible metastasis vs adenopathy.  She presented with several episodes of nausea and vomiting that started earlier in the day associated with abdominal pain and later development of chills and fevers.  Emesis was nonbloody and non-coffee-ground.  She denies change in bowel habits.  Denies dysuria.  No colonoscopy in the last 10 years. In the ED, Tmax 100.7 and tachycardic to 105, BP 106/75, fluid responsive to 129/81.  Labs notable for leukocytosis of 14,000 and lactic acid 2.2.  Lipase 53 and LFTs normal.  Urinalysis with moderate leukocytes9 point and squamous cells.  Hemoglobin 2, down from baseline 11.2 a couple months prior, creatinine 1.31 which is her baseline. Chest x-ray nonacute CT abdomen and pelvis with contrast showing a peritoneal mass adjacent to the mid transverse colon concerning for colon cancer with adjacent peritoneal metastases or adenopathy.  Recommendation for GI consult for colonoscopy and also soft tissue mass biopsy/diagnosis Patient started on Rocephin  and given a 1 L fluid bolus  Assessment & Plan:   Principal Problem:   Colonic mass on CT Active Problems:   Urinary tract infection   Sepsis (HCC)   Nausea and vomiting   History of non-Hodgkin's lymphoma   CAD S/P stent x 2 06/03/2024   Iron deficiency anemia due to chronic blood loss, suspected   History of pulmonary embolism   Presence of IVC filter   History of orthostatic syncope    Hypothyroidism   HYPERTENSION, BENIGN ESSENTIAL   HIATAL HERNIA   ERYTHEMATOSUS, LUPUS   LUPUS   Rheumatoid arthritis (HCC)   Obesity   Low back pain   COPD with acute bronchitis (HCC)   CKD stage 3b, GFR 30-44 ml/min (HCC)  # Peritoneal mass # History lymphoma Concerning for malignancy. With vomiting may be having some obstructive symptoms - continue clears - gi consult pending, tentative plan for colonoscopy tomorrow - gen surg defers surgery for now  # Fever Etiology unclear, no localizing symptoms initially. Nothing focal on cxr, no dysuria. Now with diarrhea though could be from abx. Urine culture multiple species. Covid/flu neg - f/u blood cultures - stool studies - monitor of abx - f/u stool studies  # Nausea/vomiting Appears resolved. Tolerating liquids - monitor for now  # CAD With nstemi in June of this year, 2 stents placed. Denies chest pain - dapt held, per cardiology let's continue asa for now, ordered - home statin  # Iron deficiency anemia No report of melena or other bleeding but has abdominal mass, iron deficiency anemia, hgb 7.9 today, 9s on presentation, was 12s earlier this year - trend for now, if stays below 8 will transfuse given cad - continue ppi for now though suspect lower source  # History DVT/PE - apixaban  on hold given slow bleed, possible procedure  # CKD 3a Stable - trend  # COPD Quiescent  # HTN  # Hypothyroid - home levothyroxine   # GAD - home lexapro   # History lupus/RA Not currently on meds  DVT prophylaxis: SCDs Code Status: dnr/dni Family Communication: daughter rosaline updated at bedside 9/25  Level of care: Telemetry Medical Status is: Observation    Consultants:  Gi, gen surg  Procedures: None thus far  Antimicrobials:  See above    Subjective: No complaints, no pain, no dysuria, no cough. No more vomiting. Diarrhea today, yellow/brown  Objective: Vitals:   09/22/24 0009 09/22/24 0437  09/22/24 0839 09/22/24 1220  BP: (!) 154/73 (!) 152/62 126/68 136/81  Pulse: 85 80 72 79  Resp: (!) 28 (!) 24 17 19   Temp: 98.9 F (37.2 C) 98.3 F (36.8 C) 98.3 F (36.8 C) 98.7 F (37.1 C)  TempSrc: Oral Oral Oral Oral  SpO2: 94% 94% 94% 100%  Weight:      Height:        Intake/Output Summary (Last 24 hours) at 09/22/2024 1405 Last data filed at 09/22/2024 0413 Gross per 24 hour  Intake 300 ml  Output --  Net 300 ml   Filed Weights   09/20/24 1246 09/20/24 1541  Weight: 90.7 kg 90.7 kg    Examination:  General exam: Appears calm and comfortable  Respiratory system: Clear to auscultation. Respiratory effort normal. Cardiovascular system: S1 & S2 heard, RRR. Soft systolic murmur Gastrointestinal system: Abdomen is nondistended, soft and nontender.   Central nervous system: Alert and oriented. No focal neurological deficits. Very hard of hearing Extremities: Symmetric 5 x 5 power. Skin: No rashes, lesions or ulcers Psychiatry: Judgement and insight appear normal. Mood & affect appropriate.     Data Reviewed: I have personally reviewed following labs and imaging studies  CBC: Recent Labs  Lab 09/20/24 1249 09/21/24 0453  WBC 14.3* 14.2*  HGB 9.2* 7.9*  HCT 30.5* 26.0*  MCV 86.4 85.8  PLT 220 178   Basic Metabolic Panel: Recent Labs  Lab 09/20/24 1249 09/21/24 0453  NA 138 142  K 4.3 4.3  CL 104 106  CO2 22 24  GLUCOSE 164* 105*  BUN 29* 34*  CREATININE 1.31* 1.56*  CALCIUM  8.9 8.5*   GFR: Estimated Creatinine Clearance: 32.5 mL/min (A) (by C-G formula based on SCr of 1.56 mg/dL (H)). Liver Function Tests: Recent Labs  Lab 09/20/24 1249  AST 23  ALT 24  ALKPHOS 107  BILITOT 0.6  PROT 6.8  ALBUMIN 3.7   Recent Labs  Lab 09/20/24 1249  LIPASE 53*   No results for input(s): AMMONIA in the last 168 hours. Coagulation Profile: No results for input(s): INR, PROTIME in the last 168 hours. Cardiac Enzymes: No results for input(s):  CKTOTAL, CKMB, CKMBINDEX, TROPONINI in the last 168 hours. BNP (last 3 results) No results for input(s): PROBNP in the last 8760 hours. HbA1C: No results for input(s): HGBA1C in the last 72 hours. CBG: No results for input(s): GLUCAP in the last 168 hours. Lipid Profile: No results for input(s): CHOL, HDL, LDLCALC, TRIG, CHOLHDL, LDLDIRECT in the last 72 hours. Thyroid  Function Tests: No results for input(s): TSH, T4TOTAL, FREET4, T3FREE, THYROIDAB in the last 72 hours. Anemia Panel: Recent Labs    09/20/24 1249 09/20/24 2305  VITAMINB12  --  352  FOLATE  --  >20.0  FERRITIN  --  9*  TIBC  --  410  IRON  --  <10*  RETICCTPCT 2.2  --    Urine analysis:    Component Value Date/Time   COLORURINE YELLOW (A) 09/20/2024 1942   APPEARANCEUR CLOUDY (A) 09/20/2024 1942   LABSPEC >1.046 (H) 09/20/2024 1942   PHURINE  5.0 09/20/2024 1942   GLUCOSEU NEGATIVE 09/20/2024 1942   HGBUR NEGATIVE 09/20/2024 1942   HGBUR negative 12/27/2010 0801   BILIRUBINUR NEGATIVE 09/20/2024 1942   BILIRUBINUR Neg 05/20/2013 1010   KETONESUR NEGATIVE 09/20/2024 1942   PROTEINUR 30 (A) 09/20/2024 1942   UROBILINOGEN negative 05/20/2013 1010   UROBILINOGEN 0.2 12/27/2010 0801   NITRITE NEGATIVE 09/20/2024 1942   LEUKOCYTESUR MODERATE (A) 09/20/2024 1942   Sepsis Labs: @LABRCNTIP (procalcitonin:4,lacticidven:4)  ) Recent Results (from the past 240 hours)  Blood culture (routine x 2)     Status: None (Preliminary result)   Collection Time: 09/20/24  3:40 PM   Specimen: BLOOD  Result Value Ref Range Status   Specimen Description BLOOD BLOOD RIGHT ARM  Final   Special Requests BAA Blood Culture adequate volume  Final   Culture   Final    NO GROWTH 2 DAYS Performed at [facility], [address], [place], [place] [address]    Report Status PENDING  Incomplete  Urine Culture     Status: Abnormal   Collection Time: 09/20/24  7:42 PM   Specimen: Urine,  Clean Catch  Result Value Ref Range Status   Specimen Description   Final    URINE, CLEAN CATCH Performed at [facility], [address], [place], [place] [address]    Special Requests   Final    NONE Performed at [facility], [address] [place], [place] [address]    Culture MULTIPLE SPECIES PRESENT, SUGGEST RECOLLECTION (A)  Final   Report Status 09/22/2024 FINAL  Final  Blood culture (routine x 2)     Status: None (Preliminary result)   Collection Time: 09/20/24  9:32 PM   Specimen: BLOOD  Result Value Ref Range Status   Specimen Description BLOOD BLOOD LEFT ARM  Final   Special Requests   Final    BOTTLES DRAWN AEROBIC AND ANAEROBIC Blood Culture adequate volume   Culture   Final    NO GROWTH 2 DAYS Performed at [facility], [address] [place], [place] [address]    Report Status PENDING  Incomplete  Resp panel by RT-PCR (RSV, Flu A&B, Covid) Anterior Nasal Swab     Status: None   Collection Time: 09/21/24 11:35 AM   Specimen: Anterior Nasal Swab  Result Value Ref Range Status   SARS Coronavirus 2 by RT PCR NEGATIVE NEGATIVE Final    Comment: (NOTE) SARS-CoV-2 target nucleic acids are NOT DETECTED.  The SARS-CoV-2 RNA is generally detectable in upper respiratory specimens during the acute phase of infection. The lowest concentration of SARS-CoV-2 viral copies this assay can detect is 138 copies/mL. A negative result does not preclude SARS-Cov-2 infection and should not be used as the sole basis for treatment or other patient management decisions. A negative result may occur with  improper specimen collection/handling, submission of specimen other than nasopharyngeal swab, presence of viral mutation(s) within the areas targeted by this assay, and inadequate number of viral copies(<138 copies/mL). A negative result must be combined with clinical observations, patient history, and epidemiological information. The expected result is  Negative.  Fact Sheet for Patients:  BloggerCourse.com  Fact Sheet for Healthcare Providers:  SeriousBroker.it  This test is no t yet approved or cleared by the [place]  FDA and  has been authorized for detection and/or diagnosis of SARS-CoV-2 by FDA under an Emergency Use Authorization (EUA). This EUA will remain  in effect (meaning this test can be used) for the duration of  the COVID-19 declaration under Section 564(b)(1) of the Act, 21 U.S.C.section 360bbb-3(b)(1), unless the authorization is terminated  or revoked sooner.       Influenza A by PCR NEGATIVE NEGATIVE Final   Influenza B by PCR NEGATIVE NEGATIVE Final    Comment: (NOTE) The Xpert Xpress SARS-CoV-2/FLU/RSV plus assay is intended as an aid in the diagnosis of influenza from Nasopharyngeal swab specimens and should not be used as a sole basis for treatment. Nasal washings and aspirates are unacceptable for Xpert Xpress SARS-CoV-2/FLU/RSV testing.  Fact Sheet for Patients: BloggerCourse.com  Fact Sheet for Healthcare Providers: SeriousBroker.it  This test is not yet approved or cleared by the [place]  FDA and has been authorized for detection and/or diagnosis of SARS-CoV-2 by FDA under an Emergency Use Authorization (EUA). This EUA will remain in effect (meaning this test can be used) for the duration of the COVID-19 declaration under Section 564(b)(1) of the Act, 21 U.S.C. section 360bbb-3(b)(1), unless the authorization is terminated or revoked.     Resp Syncytial Virus by PCR NEGATIVE NEGATIVE Final    Comment: (NOTE) Fact Sheet for Patients: BloggerCourse.com  Fact Sheet for Healthcare Providers: SeriousBroker.it  This test is not yet approved or cleared by the [place]  FDA and has been authorized for detection and/or diagnosis of  SARS-CoV-2 by FDA under an Emergency Use Authorization (EUA). This EUA will remain in effect (meaning this test can be used) for the duration of the COVID-19 declaration under Section 564(b)(1) of the Act, 21 U.S.C. section 360bbb-3(b)(1), unless the authorization is terminated or revoked.  Performed at [facility], [address] [place], [place] [address]          Radiology Studies: CT ABDOMEN PELVIS W CONTRAST Result Date: 09/20/2024 CLINICAL DATA:  Abdominal pain, nausea, vomiting EXAM: CT ABDOMEN AND PELVIS WITH CONTRAST TECHNIQUE: Multidetector CT imaging of the abdomen and pelvis was performed using the standard protocol following bolus administration of intravenous contrast. RADIATION DOSE REDUCTION: This exam was performed according to the departmental dose-optimization program which includes automated exposure control, adjustment of the mA and/or kV according to patient size and/or use of iterative reconstruction technique. CONTRAST:  80mL OMNIPAQUE  IOHEXOL  300 MG/ML  SOLN COMPARISON:  12/16/2017 FINDINGS: Lower chest: No acute findings. Coronary artery and aortic calcifications. Hepatobiliary: No focal liver abnormality is seen. Status post cholecystectomy. No biliary dilatation. Pancreas: No focal abnormality or ductal dilatation. Spleen: No focal abnormality.  Normal size. Adrenals/Urinary Tract: Adrenal glands normal. Small scattered bilateral subcentimeter renal cysts. No follow-up imaging recommended. 2 mm nonobstructing stones in the lower poles of both kidneys. No ureteral stones or hydronephrosis. Stomach/Bowel: Stomach and small bowel decompressed, unremarkable. 2.6 x 2.2 cm soft tissue nodule anteriorly adjacent to the mid transverse colon. The underlying: Wall may be mildly thickened. Cannot exclude transverse colon malignancy and adjacent spread or adenopathy. No bowel obstruction. Sigmoid diverticulosis. Vascular/Lymphatic: Aortic atherosclerosis. Other than the  anterior soft tissue nodule adjacent to the transverse colon, no adenopathy seen. Reproductive: Prior hysterectomy.  No adnexal masses. Other: No free fluid or free air. Musculoskeletal: No acute bony abnormality. IMPRESSION: 2.6 x 2.2 cm rounded soft tissue peritoneal mass anteriorly adjacent to the mid transverse colon. Underlying colon wall appears abnormal. Appearance is concerning for possible colon cancer with adjacent peritoneal metastasis or adenopathy. Recommend GI consultation for colonoscopy and/or soft tissue mass biopsy/diagnosis. Sigmoid diverticulosis.  No active diverticulitis. Aortic atherosclerosis. Electronically Signed   By: Franky Crease M.D.   On: 09/20/2024 18:05  DG Chest Portable 1 View Result Date: 09/20/2024 CLINICAL DATA:  Shortness of breath.  Nausea and vomiting. EXAM: PORTABLE CHEST 1 VIEW COMPARISON:  06/03/2024 FINDINGS: Shallow inspiration. Mild cardiac enlargement. No vascular congestion, edema, or consolidation. Blunting of the costophrenic angles suggesting small pleural effusions. Probable mild atelectasis in the lung bases. No pneumothorax. Old rib fractures. Calcification of the aorta. IMPRESSION: 1. Cardiac enlargement. 2. Small bilateral pleural effusions with likely basilar atelectasis. No focal consolidation. Electronically Signed   By: Elsie Gravely M.D.   On: 09/20/2024 17:07        Scheduled Meds:  atorvastatin   40 mg Oral Daily   enoxaparin  (LOVENOX ) injection  0.5 mg/kg Subcutaneous Q24H   escitalopram   20 mg Oral Daily   levothyroxine   112 mcg Oral Q0600   pantoprazole  (PROTONIX ) IV  40 mg Intravenous Q24H   pramipexole   0.125 mg Oral QHS   Continuous Infusions:  cefTRIAXone  (ROCEPHIN )  IV Stopped (09/22/24 0027)   metronidazole  Stopped (09/22/24 1044)   promethazine (PHENERGAN) injection (IM or IVPB)       LOS: 1 day     Devaughn KATHEE Ban, MD [facility]   If 7PM-7AM, please contact night-coverage [email or web address] Password  [email or web address] 09/22/2024, 2:05 PM

## 2024-09-22 NOTE — Progress Notes (Signed)
 Hannah Copping, MD [facility]   [address] [place], [place] [address] Phone: [phone] Fax : [phone]   Subjective: The patient was seen today and was found to have a CT scan showing a colonic mass with possible mets versus adenopathy.  The patient presented with nausea and vomiting and abdominal pain with fevers and chills.  She reports that her last colonoscopy was many years ago and she also reports that she is hard of hearing.   Objective: Vital signs in last 24 hours: Vitals:   09/22/24 0009 09/22/24 0437 09/22/24 0839 09/22/24 1220  BP: (!) 154/73 (!) 152/62 126/68 136/81  Pulse: 85 80 72 79  Resp: (!) 28 (!) 24 17 19   Temp: 98.9 F (37.2 C) 98.3 F (36.8 C) 98.3 F (36.8 C) 98.7 F (37.1 C)  TempSrc: Oral Oral Oral Oral  SpO2: 94% 94% 94% 100%  Weight:      Height:       Weight change:   Intake/Output Summary (Last 24 hours) at 09/22/2024 1507 Last data filed at 09/22/2024 0413 Gross per 24 hour  Intake 300 ml  Output --  Net 300 ml     Exam: Heart:: Regular rate and rhythm or without murmur or extra heart sounds Lungs: normal and clear to auscultation and percussion Abdomen: soft, nontender, normal bowel sounds   Lab Results: @LABTEST2 @ Micro Results: Recent Results (from the past 240 hours)  Blood culture (routine x 2)     Status: None (Preliminary result)   Collection Time: 09/20/24  3:40 PM   Specimen: BLOOD  Result Value Ref Range Status   Specimen Description BLOOD BLOOD RIGHT ARM  Final   Special Requests BAA Blood Culture adequate volume  Final   Culture   Final    NO GROWTH 2 DAYS Performed at [facility], [address] [place], [place] [address]    Report Status PENDING  Incomplete  Urine Culture     Status: Abnormal   Collection Time: 09/20/24  7:42 PM   Specimen: Urine, Clean Catch  Result Value Ref Range Status   Specimen Description   Final    URINE, CLEAN CATCH Performed at [facility], [address] [place], [place] [address]    Special Requests   Final    NONE Performed at [facility], [address] [place], [place] [address]    Culture MULTIPLE SPECIES PRESENT, SUGGEST RECOLLECTION (A)  Final   Report Status 09/22/2024 FINAL  Final  Blood culture (routine x 2)     Status: None (Preliminary result)   Collection Time: 09/20/24  9:32 PM   Specimen: BLOOD  Result Value Ref Range Status   Specimen Description BLOOD BLOOD LEFT ARM  Final   Special Requests   Final    BOTTLES DRAWN AEROBIC AND ANAEROBIC Blood Culture adequate volume   Culture   Final    NO GROWTH 2 DAYS Performed at [facility], [address] [place], [place] [address]    Report Status PENDING  Incomplete  Resp panel by RT-PCR (RSV, Flu A&B, Covid) Anterior Nasal Swab     Status: None   Collection Time: 09/21/24 11:35 AM   Specimen: Anterior Nasal Swab  Result Value Ref Range Status   SARS Coronavirus 2 by RT PCR NEGATIVE NEGATIVE Final    Comment: (NOTE) SARS-CoV-2 target nucleic acids are NOT DETECTED.  The SARS-CoV-2 RNA is generally detectable in upper respiratory specimens during the acute phase  of infection. The lowest concentration of SARS-CoV-2 viral copies this assay can detect is 138 copies/mL. A negative result does not preclude SARS-Cov-2 infection and should not be used as the sole basis for treatment or other patient management decisions. A negative result may occur with  improper specimen collection/handling, submission of specimen other than nasopharyngeal swab, presence of viral mutation(s) within the areas targeted by this assay, and inadequate number of viral copies(<138 copies/mL). A negative result must be combined with clinical observations, patient history, and epidemiological information. The expected result is Negative.  Fact Sheet for Patients:  BloggerCourse.com  Fact Sheet for Healthcare Providers:   SeriousBroker.it  This test is no t yet approved or cleared by the [place]  FDA and  has been authorized for detection and/or diagnosis of SARS-CoV-2 by FDA under an Emergency Use Authorization (EUA). This EUA will remain  in effect (meaning this test can be used) for the duration of the COVID-19 declaration under Section 564(b)(1) of the Act, 21 U.S.C.section 360bbb-3(b)(1), unless the authorization is terminated  or revoked sooner.       Influenza A by PCR NEGATIVE NEGATIVE Final   Influenza B by PCR NEGATIVE NEGATIVE Final    Comment: (NOTE) The Xpert Xpress SARS-CoV-2/FLU/RSV plus assay is intended as an aid in the diagnosis of influenza from Nasopharyngeal swab specimens and should not be used as a sole basis for treatment. Nasal washings and aspirates are unacceptable for Xpert Xpress SARS-CoV-2/FLU/RSV testing.  Fact Sheet for Patients: BloggerCourse.com  Fact Sheet for Healthcare Providers: SeriousBroker.it  This test is not yet approved or cleared by the [place]  FDA and has been authorized for detection and/or diagnosis of SARS-CoV-2 by FDA under an Emergency Use Authorization (EUA). This EUA will remain in effect (meaning this test can be used) for the duration of the COVID-19 declaration under Section 564(b)(1) of the Act, 21 U.S.C. section 360bbb-3(b)(1), unless the authorization is terminated or revoked.     Resp Syncytial Virus by PCR NEGATIVE NEGATIVE Final    Comment: (NOTE) Fact Sheet for Patients: BloggerCourse.com  Fact Sheet for Healthcare Providers: SeriousBroker.it  This test is not yet approved or cleared by the [place]  FDA and has been authorized for detection and/or diagnosis of SARS-CoV-2 by FDA under an Emergency Use Authorization (EUA). This EUA will remain in effect (meaning this test can be used) for  the duration of the COVID-19 declaration under Section 564(b)(1) of the Act, 21 U.S.C. section 360bbb-3(b)(1), unless the authorization is terminated or revoked.  Performed at [facility], [address] [place], [place] [address]    Studies/Results: CT ABDOMEN PELVIS W CONTRAST Result Date: 09/20/2024 CLINICAL DATA:  Abdominal pain, nausea, vomiting EXAM: CT ABDOMEN AND PELVIS WITH CONTRAST TECHNIQUE: Multidetector CT imaging of the abdomen and pelvis was performed using the standard protocol following bolus administration of intravenous contrast. RADIATION DOSE REDUCTION: This exam was performed according to the departmental dose-optimization program which includes automated exposure control, adjustment of the mA and/or kV according to patient size and/or use of iterative reconstruction technique. CONTRAST:  80mL OMNIPAQUE  IOHEXOL  300 MG/ML  SOLN COMPARISON:  12/16/2017 FINDINGS: Lower chest: No acute findings. Coronary artery and aortic calcifications. Hepatobiliary: No focal liver abnormality is seen. Status post cholecystectomy. No biliary dilatation. Pancreas: No focal abnormality or ductal dilatation. Spleen: No focal abnormality.  Normal size. Adrenals/Urinary Tract: Adrenal glands normal. Small scattered bilateral subcentimeter renal cysts. No follow-up imaging recommended. 2 mm nonobstructing stones in the lower poles of both kidneys.  No ureteral stones or hydronephrosis. Stomach/Bowel: Stomach and small bowel decompressed, unremarkable. 2.6 x 2.2 cm soft tissue nodule anteriorly adjacent to the mid transverse colon. The underlying: Wall may be mildly thickened. Cannot exclude transverse colon malignancy and adjacent spread or adenopathy. No bowel obstruction. Sigmoid diverticulosis. Vascular/Lymphatic: Aortic atherosclerosis. Other than the anterior soft tissue nodule adjacent to the transverse colon, no adenopathy seen. Reproductive: Prior hysterectomy.  No adnexal masses. Other:  No free fluid or free air. Musculoskeletal: No acute bony abnormality. IMPRESSION: 2.6 x 2.2 cm rounded soft tissue peritoneal mass anteriorly adjacent to the mid transverse colon. Underlying colon wall appears abnormal. Appearance is concerning for possible colon cancer with adjacent peritoneal metastasis or adenopathy. Recommend GI consultation for colonoscopy and/or soft tissue mass biopsy/diagnosis. Sigmoid diverticulosis.  No active diverticulitis. Aortic atherosclerosis. Electronically Signed   By: Franky Crease M.D.   On: 09/20/2024 18:05   DG Chest Portable 1 View Result Date: 09/20/2024 CLINICAL DATA:  Shortness of breath.  Nausea and vomiting. EXAM: PORTABLE CHEST 1 VIEW COMPARISON:  06/03/2024 FINDINGS: Shallow inspiration. Mild cardiac enlargement. No vascular congestion, edema, or consolidation. Blunting of the costophrenic angles suggesting small pleural effusions. Probable mild atelectasis in the lung bases. No pneumothorax. Old rib fractures. Calcification of the aorta. IMPRESSION: 1. Cardiac enlargement. 2. Small bilateral pleural effusions with likely basilar atelectasis. No focal consolidation. Electronically Signed   By: Elsie Gravely M.D.   On: 09/20/2024 17:07   Medications: I have reviewed the patient's current medications. Scheduled Meds:  atorvastatin   40 mg Oral Daily   enoxaparin  (LOVENOX ) injection  0.5 mg/kg Subcutaneous Q24H   escitalopram   20 mg Oral Daily   levothyroxine   112 mcg Oral Q0600   pantoprazole  (PROTONIX ) IV  40 mg Intravenous Q24H   polyethylene glycol-electrolytes  4,000 mL Oral Once   pramipexole   0.125 mg Oral QHS   Continuous Infusions:  sodium chloride      promethazine (PHENERGAN) injection (IM or IVPB)     PRN Meds:.acetaminophen  **OR** acetaminophen , artificial tears, diphenhydrAMINE , HYDROcodone -acetaminophen , nitroGLYCERIN , nystatin , ondansetron  **OR** ondansetron  (ZOFRAN ) IV, promethazine (PHENERGAN) injection (IM or  IVPB)   Assessment: Principal Problem:   Colonic mass on CT Active Problems:   Hypothyroidism   HYPERTENSION, BENIGN ESSENTIAL   HIATAL HERNIA   ERYTHEMATOSUS, LUPUS   LUPUS   Rheumatoid arthritis (HCC)   Urinary tract infection   Obesity   Low back pain   COPD with acute bronchitis (HCC)   History of pulmonary embolism   Presence of IVC filter   CAD S/P stent x 2 06/03/2024   Sepsis (HCC)   Iron deficiency anemia due to chronic blood loss, suspected   History of non-Hodgkin's lymphoma   Nausea and vomiting   History of orthostatic syncope   CKD stage 3b, GFR 30-44 ml/min (HCC)    Plan: The patient will be set up for  colonoscopy for tomorrow.  The patient will be given a prep today.  The patient has been explained that the mass was found in needs to be biopsied.  The patient will be set up for a colonoscopy with biopsies of the mass for tomorrow.  The patient has been off Plavix  for the last couple of days and will have her Lovenox  held tonight.    LOS: 1 day   Hannah Copping, MD.FACG 09/22/2024, 3:07 PM Pager [phone] 7am-5pm  Check AMION for 5pm -7am coverage and on weekends

## 2024-09-22 NOTE — TOC Initial Note (Signed)
 Transition of Care [facility]) - Initial/Assessment Note    Patient Details  Name: Hannah Pitts MRN: [id number] Date of Birth: 05-Dec-1946  Transition of Care [facility]) CM/SW Contact:    Dalia GORMAN Fuse, RN Phone Number: 09/22/2024, 10:29 AM  Clinical Narrative:                  TOC reviewed the patient's medical record. The patient is from home. No TOC needs, please outreach if needs are identified.       Patient Goals and CMS Choice            Expected Discharge Plan and Services                                              Prior Living Arrangements/Services                       Activities of Daily Living   ADL Screening (condition at time of admission) Independently performs ADLs?: Yes (appropriate for developmental age) Is the patient deaf or have difficulty hearing?: Yes Does the patient have difficulty seeing, even when wearing glasses/contacts?: No Does the patient have difficulty concentrating, remembering, or making decisions?: No  Permission Sought/Granted                  Emotional Assessment              Admission diagnosis:  Sepsis (HCC) [A41.9] Patient Active Problem List   Diagnosis Date Noted   CKD stage 3b, GFR 30-44 ml/min (HCC) 09/21/2024   Sepsis (HCC) 09/20/2024   Iron deficiency anemia due to chronic blood loss, suspected 09/20/2024   Colonic mass on CT 09/20/2024   History of non-Hodgkin's lymphoma 09/20/2024   Nausea and vomiting 09/20/2024   History of orthostatic syncope 09/20/2024   CAD S/P stent x 2 06/03/2024 06/11/2024   NSTEMI (non-ST elevated myocardial infarction) (HCC) 06/03/2024   Back pain 12/17/2023   Syncope 12/17/2023   Healthcare maintenance 04/08/2023   Depression, recurrent 04/08/2023   RLS (restless legs syndrome) 04/03/2022   Presence of IVC filter 04/03/2022   Change in stool 04/03/2022   Acute kidney injury superimposed on chronic kidney disease 04/18/2021   History of DVT (deep vein  thrombosis) 04/18/2021   History of pulmonary embolism 04/18/2021   Thrombocytopenia 04/18/2021   Elevated serum creatinine 01/20/2018   Pulmonary embolism (HCC) 12/12/2017   Congestive heart failure (CHF) (HCC) 12/12/2017   Obesity hypoventilation syndrome (HCC) 12/12/2017   Fall at home 08/05/2016   COPD with acute bronchitis (HCC) 12/12/2014   Hard of hearing 12/12/2014   Low back pain 10/11/2014   Advance care planning 07/31/2014   Abnormality of gait 07/31/2014   Dyspnea on exertion 07/20/2013   Obesity 07/20/2013   Medicare annual wellness visit, subsequent 07/27/2012   Palpitations 03/23/2012   Anxiety 06/10/2011   Urinary tract infection 06/05/2011   RASH AND OTHER NONSPECIFIC SKIN ERUPTION 09/13/2010   Dysphagia 03/12/2010   LUPUS 09/25/2009   SKIN LESION 09/21/2007   Tremor 05/28/2007   Hypothyroidism 05/27/2007   PERIPHERAL VASCULAR DISEASE 05/27/2007   GERD 05/27/2007   HIATAL HERNIA 05/27/2007   ERYTHEMATOSUS, LUPUS 05/27/2007   Rheumatoid arthritis (HCC) 05/27/2007   MENIERE'S DISEASE, HX OF 05/27/2007   HYPERTENSION, BENIGN ESSENTIAL 05/20/2007   PCP:  Cleatus Arlyss GORMAN, MD Pharmacy:  [id number] [place], [place] - [address] [place] [address] Phone: [phone] Fax: [phone]  [facility] [address] [place] [address] Phone: [phone] Fax: [phone]     Social Drivers of Health (SDOH) Social History: SDOH Screenings   Food Insecurity: No Food Insecurity (09/20/2024)  Housing: Low Risk  (09/20/2024)  Transportation Needs: No Transportation Needs (09/20/2024)  Utilities: Not At Risk (09/20/2024)  Alcohol  Screen: Low Risk  (04/07/2024)  Depression (PHQ2-9): Medium Risk (06/09/2024)  Financial Resource Strain: Low Risk  (04/07/2024)  Physical Activity: Insufficiently Active (04/07/2024)  Social Connections: Moderately Isolated (09/20/2024)   Stress: No Stress Concern Present (04/07/2024)  Tobacco Use: Low Risk  (09/20/2024)  Health Literacy: Adequate Health Literacy (04/07/2024)   SDOH Interventions:     Readmission Risk Interventions     No data to display

## 2024-09-23 ENCOUNTER — Encounter: Payer: Self-pay | Admitting: Internal Medicine

## 2024-09-23 ENCOUNTER — Inpatient Hospital Stay: Admitting: Registered Nurse

## 2024-09-23 ENCOUNTER — Encounter
Admission: EM | Disposition: A | Discharge status: Home / Self Care | Payer: Self-pay | Source: Home / Self Care | Attending: Obstetrics and Gynecology

## 2024-09-23 DIAGNOSIS — K6389 Other specified diseases of intestine: Secondary | ICD-10-CM

## 2024-09-23 DIAGNOSIS — D122 Benign neoplasm of ascending colon: Secondary | ICD-10-CM

## 2024-09-23 DIAGNOSIS — I2510 Atherosclerotic heart disease of native coronary artery without angina pectoris: Secondary | ICD-10-CM | POA: Diagnosis not present

## 2024-09-23 DIAGNOSIS — I130 Hypertensive heart and chronic kidney disease with heart failure and stage 1 through stage 4 chronic kidney disease, or unspecified chronic kidney disease: Secondary | ICD-10-CM | POA: Diagnosis not present

## 2024-09-23 DIAGNOSIS — D490 Neoplasm of unspecified behavior of digestive system: Secondary | ICD-10-CM

## 2024-09-23 DIAGNOSIS — D631 Anemia in chronic kidney disease: Secondary | ICD-10-CM | POA: Diagnosis not present

## 2024-09-23 DIAGNOSIS — C184 Malignant neoplasm of transverse colon: Secondary | ICD-10-CM

## 2024-09-23 DIAGNOSIS — R933 Abnormal findings on diagnostic imaging of other parts of digestive tract: Secondary | ICD-10-CM

## 2024-09-23 DIAGNOSIS — N1832 Chronic kidney disease, stage 3b: Secondary | ICD-10-CM | POA: Diagnosis not present

## 2024-09-23 DIAGNOSIS — L818 Other specified disorders of pigmentation: Secondary | ICD-10-CM | POA: Diagnosis not present

## 2024-09-23 DIAGNOSIS — D125 Benign neoplasm of sigmoid colon: Secondary | ICD-10-CM

## 2024-09-23 DIAGNOSIS — I509 Heart failure, unspecified: Secondary | ICD-10-CM | POA: Diagnosis not present

## 2024-09-23 HISTORY — PX: COLONOSCOPY: SHX5424

## 2024-09-23 LAB — BPAM RBC
Blood Product Expiration Date: 202510012359
ISSUE DATE / TIME: 202509251817
Unit Type and Rh: 1700

## 2024-09-23 LAB — TYPE AND SCREEN
ABO/RH(D): B POS
Antibody Screen: NEGATIVE
Unit division: 0

## 2024-09-23 LAB — BASIC METABOLIC PANEL WITH GFR
Anion gap: 12 (ref 5–15)
BUN: 26 mg/dL — ABNORMAL HIGH (ref 8–23)
CO2: 24 mmol/L (ref 22–32)
Calcium: 8.3 mg/dL — ABNORMAL LOW (ref 8.9–10.3)
Chloride: 105 mmol/L (ref 98–111)
Creatinine, Ser: 1.13 mg/dL — ABNORMAL HIGH (ref 0.44–1.00)
GFR, Estimated: 49 mL/min — ABNORMAL LOW (ref >60)
Glucose, Bld: 80 mg/dL (ref 70–99)
Potassium: 3.6 mmol/L (ref 3.5–5.1)
Sodium: 141 mmol/L (ref 135–145)

## 2024-09-23 LAB — CBC
HCT: 28.4 % — ABNORMAL LOW (ref 36.0–46.0)
Hemoglobin: 8.9 g/dL — ABNORMAL LOW (ref 12.0–15.0)
MCH: 25.9 pg — ABNORMAL LOW (ref 26.0–34.0)
MCHC: 31.3 g/dL (ref 30.0–36.0)
MCV: 82.6 fL (ref 80.0–100.0)
Platelets: 176 K/uL (ref 150–400)
RBC: 3.44 MIL/uL — ABNORMAL LOW (ref 3.87–5.11)
RDW: 14.2 % (ref 11.5–15.5)
WBC: 5.5 K/uL (ref 4.0–10.5)
nRBC: 0 % (ref 0.0–0.2)

## 2024-09-23 SURGERY — COLONOSCOPY
Anesthesia: General

## 2024-09-23 MED ORDER — PROPOFOL 10 MG/ML IV BOLUS
INTRAVENOUS | Status: DC | PRN
  Administered 2024-09-23: 10 mg via INTRAVENOUS
  Administered 2024-09-23: 50 mg via INTRAVENOUS

## 2024-09-23 MED ORDER — SODIUM CHLORIDE 0.9 % IV SOLN: INTRAVENOUS | Status: DC

## 2024-09-23 MED ORDER — ASPIRIN 81 MG PO TBEC: 81.0000 mg | DELAYED_RELEASE_TABLET | Freq: Every day | ORAL | Status: AC

## 2024-09-23 MED ORDER — LIDOCAINE HCL (CARDIAC) PF 100 MG/5ML IV SOSY
PREFILLED_SYRINGE | INTRAVENOUS | Status: DC | PRN
  Administered 2024-09-23: 80 mg via INTRAVENOUS

## 2024-09-23 MED ORDER — PROPOFOL 500 MG/50ML IV EMUL
INTRAVENOUS | Status: DC | PRN
  Administered 2024-09-23: 100 ug/kg/min via INTRAVENOUS

## 2024-09-23 MED ORDER — ASPIRIN 81 MG PO TBEC
81.0000 mg | DELAYED_RELEASE_TABLET | Freq: Every day | ORAL | Status: DC
Start: 2024-09-23 — End: 2024-09-23

## 2024-09-23 NOTE — Anesthesia Procedure Notes (Addendum)
 Procedure Name: MAC Date/Time: 09/23/2024 8:57 AM  Performed by: Lorrene Camelia LABOR, CRNAPre-anesthesia Checklist: Patient identified, Emergency Drugs available, Suction available and Patient being monitored Patient Re-evaluated:Patient Re-evaluated prior to induction Oxygen  Delivery Method: Simple face mask Induction Type: IV induction Comments: pom

## 2024-09-23 NOTE — Discharge Summary (Signed)
 Hannah Pitts FMW:[id number] DOB: August 17, 1946 DOA: 09/20/2024  PCP: Cleatus Arlyss RAMAN, MD  Admit date: 09/20/2024 Discharge date: 09/23/2024  Time spent: 35 minutes  Recommendations for Outpatient Follow-up:  Pcp, gen surg, and oncology f/u  CBC in about one week, will need close monitoring of anemia    Discharge Diagnoses:  Principal Problem:   Colonic mass on CT Active Problems:   Urinary tract infection   Sepsis (HCC)   Nausea and vomiting   History of non-Hodgkin's lymphoma   CAD S/P stent x 2 06/03/2024   Iron deficiency anemia due to chronic blood loss, suspected   History of pulmonary embolism   Presence of IVC filter   History of orthostatic syncope   Hypothyroidism   HYPERTENSION, BENIGN ESSENTIAL   HIATAL HERNIA   ERYTHEMATOSUS, LUPUS   LUPUS   Rheumatoid arthritis (HCC)   Obesity   Low back pain   COPD with acute bronchitis (HCC)   CKD stage 3b, GFR 30-44 ml/min (HCC)   Abnormal CT scan, colon   Intestinal neoplasm   Discharge Condition: stable  Diet recommendation: heart healthy  Filed Weights   09/20/24 1246 09/20/24 1541  Weight: 90.7 kg 90.7 kg    History of present illness:  From admission h and p Hannah Pitts is a 78 y.o. female with medical history significant for Lupus on prn prednisone /plaquenil , PE 2019 s/p IVC filter , CAD s/p stent x 2 on 06/03/2024 on Plavix  and apixaban , diastolic CHF, orthostatic syncope being admitted with sepsis suspect secondary to UTI with CT abdomen pelvis showing concern for colonic mass and possible metastasis vs adenopathy.  She presented with several episodes of nausea and vomiting that started earlier in the day associated with abdominal pain and later development of chills and fevers.  Emesis was nonbloody and non-coffee-ground.  She denies change in bowel habits.  Denies dysuria.  No colonoscopy in the last 10 years. In the ED, Tmax 100.7 and tachycardic to 105, BP 106/75, fluid responsive to 129/81.  Labs  notable for leukocytosis of 14,000 and lactic acid 2.2.  Lipase 53 and LFTs normal.  Urinalysis with moderate leukocytes9 point and squamous cells.  Hemoglobin 2, down from baseline 11.2 a couple months prior, creatinine 1.31 which is her baseline. Chest x-ray nonacute CT abdomen and pelvis with contrast showing a peritoneal mass adjacent to the mid transverse colon concerning for colon cancer with adjacent peritoneal metastases or adenopathy.  Recommendation for GI consult for colonoscopy and also soft tissue mass biopsy/diagnosis Patient started on Rocephin  and given a 1 L fluid bolus Admission requested.   Hospital Course:   Patient presents with nausea and vomiting (resolved). W/u with CT scan revealed possible colon mass. Labs also showed new iron deficiency anemia. For the mass, GI consulted and colonoscopy was performed on 9/26 which revealed a mass in the transverse colon, highly suspicious for malignancy. Biopsy obtained. For the anemia, this is likely 2/2 the colon mass, no melena or hematochezia so likely a slow bleed exacerbated by home meds. Patient was transfused one unit for hgb in the 7s (transfused at this threshold given history CAD), with appropriate rise to 8.9. Patient has a history of dvt/PE with bleeding on anticoagulation so an IVC filter was placed in the last 2010s. Patient had nstemi with 2 DESs placed in June of this year, she was discharged with new apixaban  as well as dapt though she has only been taking apixaban  and plavix . Reviewed all this with cardiology. Given the  bleeding mass we will hold apixaban  and plavix , re-start aspirin . Patient met with gen surg and case was also discussed with oncology (Dr. Jacobo). Patient is not obstructed and she declines surgical resection at this time. She will thus follow up with general surgery in about 2 weeks (this has been scheduled). She will also follow up with Dr. Jacobo in 1-2 weeks. No additional inpatient w/u was advised.    Procedures: colonoscopy   Consultations: Gen surg, GI  Discharge Exam: Vitals:   09/23/24 1023 09/23/24 1300  BP: (!) 152/56   Pulse: 62   Resp: 14   Temp:    SpO2: 98% 98%    General: NAD Cardiovascular: RRR Respiratory: CTAB Abdomen: soft, non-tender  Discharge Instructions   Discharge Instructions     Diet - low sodium heart healthy   Complete by: As directed    Increase activity slowly   Complete by: As directed       Allergies as of 09/23/2024       Reactions   Heparin  Other (See Comments)   Bruising/bleeding        Medication List     STOP taking these medications    apixaban  5 MG Tabs tablet Commonly known as: ELIQUIS    clopidogrel  75 MG tablet Commonly known as: PLAVIX    predniSONE  10 MG tablet Commonly known as: DELTASONE        TAKE these medications    acetaminophen  500 MG tablet Commonly known as: TYLENOL  Take 500 mg by mouth every 6 (six) hours as needed.   aspirin  EC 81 MG tablet Take 1 tablet (81 mg total) by mouth daily. Take after 12 weeks for prevention of preeclampsia later in pregnancy   atorvastatin  40 MG tablet Commonly known as: LIPITOR Take 1 tablet (40 mg total) by mouth daily.   diphenhydrAMINE  HCl 12.5 MG Tbdp Take 12.5-25 mg by mouth every 6 (six) hours as needed for itching.   escitalopram  20 MG tablet Commonly known as: LEXAPRO  Take 1 tablet (20 mg total) by mouth daily.   levothyroxine  112 MCG tablet Commonly known as: SYNTHROID  TAKE 1 TABLET (112 MCG TOTAL) BY MOUTH DAILY BEFORE BREAKFAST. EXCEPT SKIP SUNDAY. 6 TABS A WEEK.   nitroGLYCERIN  0.4 MG SL tablet Commonly known as: NITROSTAT  Place 1 tablet (0.4 mg total) under the tongue every 5 (five) minutes x 3 doses as needed for chest pain.   nystatin  powder Commonly known as: MYCOSTATIN /NYSTOP  Apply 1 Application topically 2 (two) times daily as needed.   pramipexole  0.125 MG tablet Commonly known as: MIRAPEX  TAKE 1 TABLET BY MOUTH AT  BEDTIME. TO REPLACE REQUIP        Allergies  Allergen Reactions   Heparin  Other (See Comments)    Bruising/bleeding    Follow-up Information     Cleatus Arlyss RAMAN, MD Follow up.   Specialty: Family Medicine Why: hospital follow up Contact information: [address] [place] [address] [phone]         Lane Shope, MD. Go on 10/04/2024.   Specialty: General Surgery Why: Go to appointment on 10/07 at 900 AM Contact information: [address] [place] [address] [phone]         Jacobo Evalene PARAS, MD Follow up.   Specialty: Oncology Why: in approximately 1 week Contact information: [address] [place] [address] [phone]                  The results of significant diagnostics from this hospitalization (  including imaging, microbiology, ancillary and laboratory) are listed below for reference.    Significant Diagnostic Studies: CT ABDOMEN PELVIS W CONTRAST Result Date: 09/20/2024 CLINICAL DATA:  Abdominal pain, nausea, vomiting EXAM: CT ABDOMEN AND PELVIS WITH CONTRAST TECHNIQUE: Multidetector CT imaging of the abdomen and pelvis was performed using the standard protocol following bolus administration of intravenous contrast. RADIATION DOSE REDUCTION: This exam was performed according to the departmental dose-optimization program which includes automated exposure control, adjustment of the mA and/or kV according to patient size and/or use of iterative reconstruction technique. CONTRAST:  80mL OMNIPAQUE  IOHEXOL  300 MG/ML  SOLN COMPARISON:  12/16/2017 FINDINGS: Lower chest: No acute findings. Coronary artery and aortic calcifications. Hepatobiliary: No focal liver abnormality is seen. Status post cholecystectomy. No biliary dilatation. Pancreas: No focal abnormality or ductal dilatation. Spleen: No focal abnormality.  Normal size. Adrenals/Urinary Tract: Adrenal glands normal. Small scattered bilateral  subcentimeter renal cysts. No follow-up imaging recommended. 2 mm nonobstructing stones in the lower poles of both kidneys. No ureteral stones or hydronephrosis. Stomach/Bowel: Stomach and small bowel decompressed, unremarkable. 2.6 x 2.2 cm soft tissue nodule anteriorly adjacent to the mid transverse colon. The underlying: Wall may be mildly thickened. Cannot exclude transverse colon malignancy and adjacent spread or adenopathy. No bowel obstruction. Sigmoid diverticulosis. Vascular/Lymphatic: Aortic atherosclerosis. Other than the anterior soft tissue nodule adjacent to the transverse colon, no adenopathy seen. Reproductive: Prior hysterectomy.  No adnexal masses. Other: No free fluid or free air. Musculoskeletal: No acute bony abnormality. IMPRESSION: 2.6 x 2.2 cm rounded soft tissue peritoneal mass anteriorly adjacent to the mid transverse colon. Underlying colon wall appears abnormal. Appearance is concerning for possible colon cancer with adjacent peritoneal metastasis or adenopathy. Recommend GI consultation for colonoscopy and/or soft tissue mass biopsy/diagnosis. Sigmoid diverticulosis.  No active diverticulitis. Aortic atherosclerosis. Electronically Signed   By: Franky Crease M.D.   On: 09/20/2024 18:05   DG Chest Portable 1 View Result Date: 09/20/2024 CLINICAL DATA:  Shortness of breath.  Nausea and vomiting. EXAM: PORTABLE CHEST 1 VIEW COMPARISON:  06/03/2024 FINDINGS: Shallow inspiration. Mild cardiac enlargement. No vascular congestion, edema, or consolidation. Blunting of the costophrenic angles suggesting small pleural effusions. Probable mild atelectasis in the lung bases. No pneumothorax. Old rib fractures. Calcification of the aorta. IMPRESSION: 1. Cardiac enlargement. 2. Small bilateral pleural effusions with likely basilar atelectasis. No focal consolidation. Electronically Signed   By: Elsie Gravely M.D.   On: 09/20/2024 17:07    Microbiology: Recent Results (from the past 240 hours)   Blood culture (routine x 2)     Status: None (Preliminary result)   Collection Time: 09/20/24  3:40 PM   Specimen: BLOOD  Result Value Ref Range Status   Specimen Description BLOOD BLOOD RIGHT ARM  Final   Special Requests BAA Blood Culture adequate volume  Final   Culture   Final    NO GROWTH 3 DAYS Performed at [facility], [address] [place], [place] [address]    Report Status PENDING  Incomplete  Urine Culture     Status: Abnormal   Collection Time: 09/20/24  7:42 PM   Specimen: Urine, Clean Catch  Result Value Ref Range Status   Specimen Description   Final    URINE, CLEAN CATCH Performed at [facility], [address], [place], [place] [address]    Special Requests   Final    NONE Performed at [facility], [address] [place], [place] [address]    Culture MULTIPLE SPECIES  PRESENT, SUGGEST RECOLLECTION (A)  Final   Report Status 09/22/2024 FINAL  Final  Blood culture (routine x 2)     Status: None (Preliminary result)   Collection Time: 09/20/24  9:32 PM   Specimen: BLOOD  Result Value Ref Range Status   Specimen Description BLOOD BLOOD LEFT ARM  Final   Special Requests   Final    BOTTLES DRAWN AEROBIC AND ANAEROBIC Blood Culture adequate volume   Culture   Final    NO GROWTH 3 DAYS Performed at [facility], [address], [place], [place] [address]    Report Status PENDING  Incomplete  Resp panel by RT-PCR (RSV, Flu A&B, Covid) Anterior Nasal Swab     Status: None   Collection Time: 09/21/24 11:35 AM   Specimen: Anterior Nasal Swab  Result Value Ref Range Status   SARS Coronavirus 2 by RT PCR NEGATIVE NEGATIVE Final    Comment: (NOTE) SARS-CoV-2 target nucleic acids are NOT DETECTED.  The SARS-CoV-2 RNA is generally detectable in upper respiratory specimens during the acute phase of infection. The lowest concentration of SARS-CoV-2 viral copies this assay can detect is 138 copies/mL. A negative result does  not preclude SARS-Cov-2 infection and should not be used as the sole basis for treatment or other patient management decisions. A negative result may occur with  improper specimen collection/handling, submission of specimen other than nasopharyngeal swab, presence of viral mutation(s) within the areas targeted by this assay, and inadequate number of viral copies(<138 copies/mL). A negative result must be combined with clinical observations, patient history, and epidemiological information. The expected result is Negative.  Fact Sheet for Patients:  BloggerCourse.com  Fact Sheet for Healthcare Providers:  SeriousBroker.it  This test is no t yet approved or cleared by the [place]  FDA and  has been authorized for detection and/or diagnosis of SARS-CoV-2 by FDA under an Emergency Use Authorization (EUA). This EUA will remain  in effect (meaning this test can be used) for the duration of the COVID-19 declaration under Section 564(b)(1) of the Act, 21 U.S.C.section 360bbb-3(b)(1), unless the authorization is terminated  or revoked sooner.       Influenza A by PCR NEGATIVE NEGATIVE Final   Influenza B by PCR NEGATIVE NEGATIVE Final    Comment: (NOTE) The Xpert Xpress SARS-CoV-2/FLU/RSV plus assay is intended as an aid in the diagnosis of influenza from Nasopharyngeal swab specimens and should not be used as a sole basis for treatment. Nasal washings and aspirates are unacceptable for Xpert Xpress SARS-CoV-2/FLU/RSV testing.  Fact Sheet for Patients: BloggerCourse.com  Fact Sheet for Healthcare Providers: SeriousBroker.it  This test is not yet approved or cleared by the [place]  FDA and has been authorized for detection and/or diagnosis of SARS-CoV-2 by FDA under an Emergency Use Authorization (EUA). This EUA will remain in effect (meaning this test can be used) for the  duration of the COVID-19 declaration under Section 564(b)(1) of the Act, 21 U.S.C. section 360bbb-3(b)(1), unless the authorization is terminated or revoked.     Resp Syncytial Virus by PCR NEGATIVE NEGATIVE Final    Comment: (NOTE) Fact Sheet for Patients: BloggerCourse.com  Fact Sheet for Healthcare Providers: SeriousBroker.it  This test is not yet approved or cleared by the [place]  FDA and has been authorized for detection and/or diagnosis of SARS-CoV-2 by FDA under an Emergency Use Authorization (EUA). This EUA will remain in effect (meaning this test can be used) for the duration of the COVID-19 declaration under Section 564(b)(1) of  the Act, 21 U.S.C. section 360bbb-3(b)(1), unless the authorization is terminated or revoked.  Performed at [facility], [address] [place], [place] [address]   C Difficile Quick Screen w PCR reflex     Status: None   Collection Time: 09/22/24  5:45 PM   Specimen: STOOL  Result Value Ref Range Status   C Diff antigen NEGATIVE NEGATIVE Final   C Diff toxin NEGATIVE NEGATIVE Final   C Diff interpretation No C. difficile detected.  Final    Comment: Performed at [facility], [address] [place], [place] [address]  Gastrointestinal Panel by PCR , Stool     Status: None   Collection Time: 09/22/24  5:45 PM   Specimen: Stool  Result Value Ref Range Status   Campylobacter species NOT DETECTED NOT DETECTED Final   Plesimonas shigelloides NOT DETECTED NOT DETECTED Final   Salmonella species NOT DETECTED NOT DETECTED Final   Yersinia enterocolitica NOT DETECTED NOT DETECTED Final   Vibrio species NOT DETECTED NOT DETECTED Final   Vibrio cholerae NOT DETECTED NOT DETECTED Final   Enteroaggregative E coli (EAEC) NOT DETECTED NOT DETECTED Final   Enteropathogenic E coli (EPEC) NOT DETECTED NOT DETECTED Final   Enterotoxigenic E coli (ETEC) NOT DETECTED NOT DETECTED  Final   Shiga like toxin producing E coli (STEC) NOT DETECTED NOT DETECTED Final   Shigella/Enteroinvasive E coli (EIEC) NOT DETECTED NOT DETECTED Final   Cryptosporidium NOT DETECTED NOT DETECTED Final   Cyclospora cayetanensis NOT DETECTED NOT DETECTED Final   Entamoeba histolytica NOT DETECTED NOT DETECTED Final   Giardia lamblia NOT DETECTED NOT DETECTED Final   Adenovirus F40/41 NOT DETECTED NOT DETECTED Final   Astrovirus NOT DETECTED NOT DETECTED Final   Norovirus GI/GII NOT DETECTED NOT DETECTED Final   Rotavirus A NOT DETECTED NOT DETECTED Final   Sapovirus (I, II, IV, and V) NOT DETECTED NOT DETECTED Final    Comment: Performed at [facility], [address] [place], [place] [address]     Labs: Basic Metabolic Panel: Recent Labs  Lab 09/20/24 1249 09/21/24 0453 09/23/24 0604  NA 138 142 141  K 4.3 4.3 3.6  CL 104 106 105  CO2 22 24 24   GLUCOSE 164* 105* 80  BUN 29* 34* 26*  CREATININE 1.31* 1.56* 1.13*  CALCIUM  8.9 8.5* 8.3*   Liver Function Tests: Recent Labs  Lab 09/20/24 1249  AST 23  ALT 24  ALKPHOS 107  BILITOT 0.6  PROT 6.8  ALBUMIN 3.7   Recent Labs  Lab 09/20/24 1249  LIPASE 53*   No results for input(s): AMMONIA in the last 168 hours. CBC: Recent Labs  Lab 09/20/24 1249 09/21/24 0453 09/22/24 1409 09/23/24 0604  WBC 14.3* 14.2* 4.7 5.5  HGB 9.2* 7.9* 7.2* 8.9*  HCT 30.5* 26.0* 23.6* 28.4*  MCV 86.4 85.8 84.6 82.6  PLT 220 178 145* 176   Cardiac Enzymes: No results for input(s): CKTOTAL, CKMB, CKMBINDEX, TROPONINI in the last 168 hours. BNP: BNP (last 3 results) No results for input(s): BNP in the last 8760 hours.  ProBNP (last 3 results) No results for input(s): PROBNP in the last 8760 hours.  CBG: No results for input(s): GLUCAP in the last 168 hours.     Signed:  Devaughn KATHEE Ban MD.  [facility] 09/23/2024, 2:14 PM

## 2024-09-23 NOTE — Anesthesia Preprocedure Evaluation (Signed)
 Anesthesia Evaluation  Patient identified by MRN, date of birth, ID band Patient awake    Reviewed: Allergy & Precautions, NPO status , Patient's Chart, lab work & pertinent test results  Airway Mallampati: III  TM Distance: >3 FB Neck ROM: full    Dental  (+) Edentulous Upper, Edentulous Lower   Pulmonary neg pulmonary ROS, COPD   Pulmonary exam normal  + decreased breath sounds      Cardiovascular Exercise Tolerance: Poor hypertension, + CAD, + Cardiac Stents and + Peripheral Vascular Disease  negative cardio ROS Normal cardiovascular exam Rhythm:Regular     Neuro/Psych  Headaches  Anxiety     negative neurological ROS  negative psych ROS   GI/Hepatic negative GI ROS, Neg liver ROS, hiatal hernia,GERD  Medicated,,  Endo/Other  negative endocrine ROSHypothyroidism  Class 3 obesity  Renal/GU negative Renal ROS  negative genitourinary   Musculoskeletal  (+) Arthritis ,    Abdominal  (+) + obese  Peds negative pediatric ROS (+)  Hematology negative hematology ROS (+) Blood dyscrasia, anemia   Anesthesia Other Findings Past Medical History: No date: Abnormal involuntary movements(781.0) No date: Anemia of other chronic disease No date: Depressive disorder, not elsewhere classified No date: Diaphragmatic hernia without mention of obstruction or  gangrene No date: Dysthymic disorder No date: Generalized hyperhidrosis No date: GERD (gastroesophageal reflux disease) No date: H/O hiatal hernia No date: History of pulmonary embolus (PE) 03/2010: Lymphoma (HCC)     Comment:  they got it all w/surgery No date: Meniere's disease of left ear No date: Migraine     Comment:  used to have classic kind; not anymore; last one was in              2012 No date: Other rheumatoid arthritis with visceral or systemic  involvement No date: Palpitations No date: Personal history of other disorders of nervous system and  sense  organs No date: Systemic lupus erythematosus (HCC) No date: Unspecified disease of pericardium     Comment:  Pericarditis  No date: Unspecified hypothyroidism No date: Urticaria, unspecified  Past Surgical History: 1978: ABDOMINAL HYSTERECTOMY 1974: APPENDECTOMY 03/2010: BREAST BIOPSY     Comment:  left breast;  03/2010: BREAST LUMPECTOMY     Comment:  left breast; nonHodgkins lymphoma; not breast cancer 1974: CHOLECYSTECTOMY 06/03/2024: CORONARY STENT INTERVENTION; N/A     Comment:  Procedure: CORONARY STENT INTERVENTION;  Surgeon:               [place], Peter M, MD;  Location: [facility] INVASIVE CV LAB;                Service: Cardiovascular;  Laterality: N/A; ?02/2011: ESOPHAGEAL DILATION 01-24-11: ESOPHAGOGASTRODUODENOSCOPY     Comment:  Slight stenosis esoph spasm 12/17/2017: IR IVC FILTER PLMT / S&I /IMG GUID/MOD SED 06/03/2024: LEFT HEART CATH AND CORONARY ANGIOGRAPHY; N/A     Comment:  Procedure: LEFT HEART CATH AND CORONARY ANGIOGRAPHY;                Surgeon: [place], Peter M, MD;  Location: [facility] INVASIVE CV               LAB;  Service: Cardiovascular;  Laterality: N/A; 12/1997: Left VATS 1977: LUMBAR DISC SURGERY No date: shunt     Comment:  put in behind left ear ; Menier's syndrome  BMI    Body Mass Index: 33.28 kg/m      Reproductive/Obstetrics negative OB ROS  Anesthesia Physical Anesthesia Plan  ASA: 3  Anesthesia Plan: General   Post-op Pain Management:    Induction: Intravenous  PONV Risk Score and Plan: Propofol  infusion and TIVA  Airway Management Planned: Natural Airway and Nasal Cannula  Additional Equipment:   Intra-op Plan:   Post-operative Plan:   Informed Consent: I have reviewed the patients History and Physical, chart, labs and discussed the procedure including the risks, benefits and alternatives for the proposed anesthesia with the patient or authorized representative who has indicated  his/her understanding and acceptance.     Dental Advisory Given  Plan Discussed with: CRNA  Anesthesia Plan Comments:         Anesthesia Quick Evaluation

## 2024-09-23 NOTE — Progress Notes (Signed)
 The patient had a colonoscopy with a polyp seen in the ascending colon near the cecum and it was not removed because the patient is on Plavix .  The patient also had a polyp in the sigmoid colon.  None of these polyps look suspicious for any malignancy.  The mass in the transverse colon was biopsied and tattooed.  A surgical consult would be prudent.  Nothing further to do from a GI point of view.  I will sign off.  Please call if any further GI concerns or questions.  We would like to thank you for the opportunity to participate in the care of Hannah Pitts.

## 2024-09-23 NOTE — Transfer of Care (Signed)
 Immediate Anesthesia Transfer of Care Note  Patient: Hannah Pitts  Procedure(s) Performed: COLONOSCOPY  Patient Location: Endoscopy Unit  Anesthesia Type:General  Level of Consciousness: drowsy and patient cooperative  Airway & Oxygen  Therapy: Patient Spontanous Breathing  Post-op Assessment: Report given to RN and Post -op Vital signs reviewed and stable  Post vital signs: Reviewed and stable  Last Vitals:  Vitals Value Taken Time  BP    Temp    Pulse 78 09/23/24 09:22  Resp 17 09/23/24 09:22  SpO2 96 % 09/23/24 09:22  Vitals shown include unfiled device data.  Last Pain:  Vitals:   09/23/24 0837  TempSrc: Temporal  PainSc:          Complications: No notable events documented.

## 2024-09-23 NOTE — Progress Notes (Signed)
 Mobility Specialist - Progress Note   Pre-mobility: HR-88, SpO2-98 During mobility: HR-114,SpO2-93 Post-mobility: HR- 103, SPO2- 95    09/23/24 1300  Oxygen  Therapy  SpO2 98 %  O2 Device Room Air  Mobility  Activity Ambulated with assistance;Stood at bedside  Level of Assistance Contact guard assist, steadying assist  Assistive Device Front wheel walker  Distance Ambulated (ft) 40 ft  Range of Motion/Exercises Active Assistive  Activity Response Tolerated well  Mobility visit 1 Mobility  Mobility Specialist Start Time (ACUTE ONLY) 1338  Mobility Specialist Stop Time (ACUTE ONLY) 1354  Mobility Specialist Time Calculation (min) (ACUTE ONLY) 16 min   Pt was supine in the bed upon entry. Pt agreed to mobility. Pt recently had a procedure and insisted to mobilize. Pt was able to EOB independently. Pt was able to STS independently. Pt ambulated well. Pt did take a recovery break. Pt O2 were checked throughout and O2 levels were above 90%. After activity pt returned to EOB with daughter in the room. Needs were in reach and alarm on.  Clem Rodes Mobility Specialist 09/23/24, 2:05 PM

## 2024-09-23 NOTE — Op Note (Signed)
 [facility] Gastroenterology Patient Name: Hannah Pitts Procedure Date: 09/23/2024 8:52 AM MRN: [id number] Account #: [id number] Date of Birth: 03/28/1946 Admit Type: Inpatient Age: 78 Room: [facility] ENDO ROOM 4 Gender: Female Note Status: Finalized Instrument Name: Colon Scope [id number] Procedure:             Colonoscopy Indications:           Abnormal CT of the GI tract Providers:             Rogelia Copping MD, MD Referring MD:          Arlyss RAMAN. Cleatus, MD (Referring MD) Medicines:             Propofol  per Anesthesia Complications:         No immediate complications. Procedure:             Pre-Anesthesia Assessment:                        - Prior to the procedure, a History and Physical was                         performed, and patient medications and allergies were                         reviewed. The patient's tolerance of previous                         anesthesia was also reviewed. The risks and benefits                         of the procedure and the sedation options and risks                         were discussed with the patient. All questions were                         answered, and informed consent was obtained. Prior                         Anticoagulants: The patient has taken Plavix                          (clopidogrel ), last dose was 2 days prior to                         procedure. ASA Grade Assessment: II - A patient with                         mild systemic disease. After reviewing the risks and                         benefits, the patient was deemed in satisfactory                         condition to undergo the procedure.                        After obtaining informed consent, the colonoscope was  passed under direct vision. Throughout the procedure,                         the patient's blood pressure, pulse, and oxygen                          saturations were monitored continuously. The                          Colonoscope was introduced through the anus and                         advanced to the the cecum, identified by appendiceal                         orifice and ileocecal valve. The colonoscopy was                         performed without difficulty. The patient tolerated                         the procedure well. The quality of the bowel                         preparation was good. Findings:      The perianal and digital rectal examinations were normal.      A 6 mm polyp was found in the sigmoid colon. The polyp was sessile.       Polypectomy was not attempted due to the patient taking anticoagulation       medication.      An ulcerated non-obstructing medium-sized mass was found in the       transverse colon. The mass was non-circumferential. No bleeding was       present. This was biopsied with a cold forceps for histology. Area was       tattooed with an injection of Spot (carbon black).      A 7 mm polyp was found in the proximal ascending colon. The polyp was       pedunculated. Polypectomy was not attempted due to the patient taking       anticoagulation medication. Impression:            - One 6 mm polyp in the sigmoid colon. Resection not                         attempted.                        - Likely malignant tumor in the transverse colon.                         Biopsied. Tattooed.                        - One 7 mm polyp in the proximal ascending colon.                         Resection not attempted. Recommendation:        - Return patient to hospital ward for ongoing care.                        -  Resume previous diet.                        - Continue present medications.                        - Repeat colonoscopy for polyp removals after                         anticoagulation has been stopped.                        - Refer to a Careers adviser. Procedure Code(s):     --- Professional ---                        [phone], Colonoscopy, flexible; with biopsy, single or                          multiple                        45381, Colonoscopy, flexible; with directed submucosal                         injection(s), any substance Diagnosis Code(s):     --- Professional ---                        R93.3, Abnormal findings on diagnostic imaging of                         other parts of digestive tract                        D49.0, Neoplasm of unspecified behavior of digestive                         system                        D12.5, Benign neoplasm of sigmoid colon CPT copyright 2022 American Medical Association. All rights reserved. The codes documented in this report are preliminary and upon coder review may  be revised to meet current compliance requirements. Rogelia Copping MD, MD 09/23/2024 9:20:47 AM This report has been signed electronically. Number of Addenda: 0 Note Initiated On: 09/23/2024 8:52 AM Scope Withdrawal Time: 0 hours 7 minutes 10 seconds  Total Procedure Duration: 0 hours 15 minutes 0 seconds  Estimated Blood Loss:  Estimated blood loss: none.      [facility]

## 2024-09-23 NOTE — Consult Note (Addendum)
 [facility] SURGICAL CONSULTATION NOTE (initial) - cpt: 99254   HISTORY OF PRESENT ILLNESS (HPI):  78 y.o. female presented to [facility] ED on 09/23 secondary to emesis. Patient is pleasantly demented and HOH so does not provide reliable history. Family is present to assist today. She had reportedly been having nausea and emesis prior to presenting to the ED. She had not been reporting any significant abdominal pain. Previous abdominal surgeries hysterectomy, appendectomy, cholecystectomy. Of note, she has recent PCI within the last 3 months and is on Plavix  and ASA. Family reports she lives with them. She is struggling more recently with ADLs. She can not ambulate around the house without SOB. Work up in the ED revealed a leukocytosis to 14.3K (now 5.5), Hgb to 9.2 (8.9), baseline sCr - 1.31, venous lactate was 2.2 but this resolved. She did get a CT concerning for possible soft tissue mass adjacent to the transverse colon. She was admitted to the medicine service. She was able to tolerate bowel prep and underwent colonoscopy today (09/26) with Dr Jinny concerning for transverse colon mass.   Surgery is consulted by hospitalist physician Dr. Devaughn Ban, MD in this context for evaluation and management of transverse colon mass.   PAST MEDICAL HISTORY (PMH):  Past Medical History:  Diagnosis Date   Abnormal involuntary movements(781.0)    Anemia of other chronic disease    Depressive disorder, not elsewhere classified    Diaphragmatic hernia without mention of obstruction or gangrene    Dysthymic disorder    Generalized hyperhidrosis    GERD (gastroesophageal reflux disease)    H/O hiatal hernia    History of pulmonary embolus (PE)    Lymphoma (HCC) 03/2010   they got it all w/surgery   Meniere's disease of left ear    Migraine    used to have classic kind; not anymore; last one was in 2012   Other rheumatoid arthritis with visceral or systemic involvement    Palpitations     Personal history of other disorders of nervous system and sense organs    Systemic lupus erythematosus (HCC)    Unspecified disease of pericardium    Pericarditis    Unspecified hypothyroidism    Urticaria, unspecified      PAST SURGICAL HISTORY (PSH):  Past Surgical History:  Procedure Laterality Date   ABDOMINAL HYSTERECTOMY  1978   APPENDECTOMY  1974   BREAST BIOPSY  03/2010   left breast;    BREAST LUMPECTOMY  03/2010   left breast; nonHodgkins lymphoma; not breast cancer   CHOLECYSTECTOMY  1974   CORONARY STENT INTERVENTION N/A 06/03/2024   Procedure: CORONARY STENT INTERVENTION;  Surgeon: Swaziland, Peter M, MD;  Location: [facility] INVASIVE CV LAB;  Service: Cardiovascular;  Laterality: N/A;   ESOPHAGEAL DILATION  ?02/2011   ESOPHAGOGASTRODUODENOSCOPY  01-24-11   Slight stenosis esoph spasm   IR IVC FILTER PLMT / S&I /IMG GUID/MOD SED  12/17/2017   LEFT HEART CATH AND CORONARY ANGIOGRAPHY N/A 06/03/2024   Procedure: LEFT HEART CATH AND CORONARY ANGIOGRAPHY;  Surgeon: Swaziland, Peter M, MD;  Location: [facility] INVASIVE CV LAB;  Service: Cardiovascular;  Laterality: N/A;   Left VATS  12/1997   LUMBAR DISC SURGERY  1977   shunt     put in behind left ear ; Menier's syndrome     MEDICATIONS:  Prior to Admission medications   Medication Sig Start Date End Date Taking? Authorizing Provider  acetaminophen  (TYLENOL ) 500 MG tablet Take 500 mg by mouth every 6 (  six) hours as needed.   Yes [provider]  apixaban  (ELIQUIS ) 5 MG TABS tablet Take 1 tablet (5 mg total) by mouth 2 (two) times daily. 08/08/24  Yes Cleatus Arlyss RAMAN, MD  atorvastatin  (LIPITOR) 40 MG tablet Take 1 tablet (40 mg total) by mouth daily. 08/08/24  Yes Cleatus Arlyss RAMAN, MD  clopidogrel  (PLAVIX ) 75 MG tablet Take 1 tablet (75 mg total) by mouth daily with breakfast. 08/08/24  Yes Cleatus Arlyss RAMAN, MD  diphenhydrAMINE  HCl 12.5 MG TBDP Take 12.5-25 mg by mouth every 6 (six) hours as needed for itching.   Yes [provider]  escitalopram  (LEXAPRO ) 20 MG tablet Take 1 tablet (20 mg total) by mouth daily. 05/20/24  Yes Cleatus Arlyss RAMAN, MD  levothyroxine  (SYNTHROID ) 112 MCG tablet TAKE 1 TABLET (112 MCG TOTAL) BY MOUTH DAILY BEFORE BREAKFAST. EXCEPT SKIP SUNDAY. 6 TABS A WEEK. 07/13/24  Yes Cleatus Arlyss RAMAN, MD  nitroGLYCERIN  (NITROSTAT ) 0.4 MG SL tablet Place 1 tablet (0.4 mg total) under the tongue every 5 (five) minutes x 3 doses as needed for chest pain. 06/04/24  Yes Vicci Rollo SAUNDERS, PA-C  pramipexole  (MIRAPEX ) 0.125 MG tablet TAKE 1 TABLET BY MOUTH AT BEDTIME. TO REPLACE REQUIP  04/12/24  Yes Cleatus Arlyss RAMAN, MD  nystatin  (MYCOSTATIN /NYSTOP ) powder Apply 1 Application topically 2 (two) times daily as needed. 06/09/24   Cleatus Arlyss RAMAN, MD  predniSONE  (DELTASONE ) 10 MG tablet 10mg  daily with food. Don't take with aleve/ibuprofen . If better, try cutting back to 1/2 tab a day. Patient not taking: Reported on 09/20/2024 08/08/24   Cleatus Arlyss RAMAN, MD     ALLERGIES:  Allergies  Allergen Reactions   Heparin  Other (See Comments)    Bruising/bleeding     SOCIAL HISTORY:  Social History   Socioeconomic History   Marital status: Widowed    Spouse name: Not on file   Number of children: 2   Years of education: Not on file   Highest education level: Not on file  Occupational History   Occupation: Disabled     Employer: RETIRED  Tobacco Use   Smoking status: Never   Smokeless tobacco: Never  Vaping Use   Vaping status: Never Used  Substance and Sexual Activity   Alcohol  use: No   Drug use: No   Sexual activity: Never  Other Topics Concern   Not on file  Social History Narrative   Ms. Curley lives in [place], [place].    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    Social Drivers of Corporate investment banker Strain: Low Risk   (04/07/2024)   Overall Financial Resource Strain (CARDIA)    Difficulty of Paying Living Expenses: Not hard at all  Food Insecurity: No Food Insecurity (09/20/2024)   Hunger Vital Sign    Worried About Running Out of Food in the Last Year: Never true    Ran Out of Food in the Last Year: Never true  Transportation Needs: No Transportation Needs (09/20/2024)   PRAPARE - Administrator, Civil Service (Medical): No    Lack of Transportation (Non-Medical): No  Physical Activity: Insufficiently Active (04/07/2024)   Exercise Vital Sign    Days of Exercise per Week: 3 days    Minutes of Exercise per Session: 30 min  Stress:  No Stress Concern Present (04/07/2024)   [employer] of Occupational Health - Occupational Stress Questionnaire    Feeling of Stress : Not at all  Social Connections: Moderately Isolated (09/20/2024)   Social Connection and Isolation Panel    Frequency of Communication with Friends and Family: More than three times a week    Frequency of Social Gatherings with Friends and Family: More than three times a week    Attends Religious Services: More than 4 times per year    Active Member of [employer] or Organizations: No    Attends Banker Meetings: Never    Marital Status: Widowed  Intimate Partner Violence: Not At Risk (09/20/2024)   Humiliation, Afraid, Rape, and Kick questionnaire    Fear of Current or Ex-Partner: No    Emotionally Abused: No    Physically Abused: No    Sexually Abused: No     FAMILY HISTORY:  Family History  Problem Relation Age of Onset   Heart failure Mother    Stroke Father    Coronary artery disease Sister    COPD Sister    Coronary artery disease Brother    Diabetes Brother    Heart failure Brother    Coronary artery disease Sister    Coronary artery disease Sister    Cancer Sister    Heart failure Sister    Cancer Sister        lung, esoph   Cancer Sister        lung   Breast cancer Maternal Aunt    Colon  cancer Neg Hx       REVIEW OF SYSTEMS:  Review of Systems  Constitutional:  Negative for chills and fever.  Respiratory:  Negative for cough and wheezing.   Cardiovascular:  Negative for chest pain and palpitations.  Gastrointestinal:  Negative for abdominal pain, nausea and vomiting.  Genitourinary:  Negative for dysuria and urgency.  All other systems reviewed and are negative.   VITAL SIGNS:  Temp:  [97 F (36.1 C)-98.7 F (37.1 C)] 97 F (36.1 C) (09/26 0837) Pulse Rate:  [73-81] 78 (09/26 0923) Resp:  [16-22] 20 (09/26 1013) BP: (115-169)/(53-92) 140/55 (09/26 1013) SpO2:  [93 %-100 %] 95 % (09/26 1013)     Height: 5' 5 (165.1 cm) Weight: 90.7 kg BMI (Calculated): 33.28   INTAKE/OUTPUT:  09/25 0701 - 09/26 0700 In: 495 [I.V.:5; Blood:490] Out: -   PHYSICAL EXAM:  Physical Exam Vitals and nursing note reviewed. Exam conducted with a chaperone present.  Constitutional:      General: She is not in acute distress.    Appearance: Normal appearance. She is obese. She is not ill-appearing.  HENT:     Head: Normocephalic and atraumatic.     Right Ear: Decreased hearing noted.     Left Ear: Decreased hearing noted.     Ears:     Comments: She is hard of hearing Eyes:     General: No scleral icterus.    Conjunctiva/sclera: Conjunctivae normal.  Cardiovascular:     Rate and Rhythm: Normal rate.     Pulses: Normal pulses.  Pulmonary:     Effort: Pulmonary effort is normal. No respiratory distress.  Abdominal:     General: Abdomen is flat. There is no distension.     Palpations: Abdomen is soft.     Tenderness: There is no abdominal tenderness. There is no guarding or rebound.  Genitourinary:    Comments: Deferred Skin:    General: Skin  is warm and dry.     Coloration: Skin is not jaundiced.  Neurological:     General: No focal deficit present.     Mental Status: She is alert. Mental status is at baseline.  Psychiatric:        Mood and Affect: Mood normal.         Behavior: Behavior normal.      Labs:     Latest Ref Rng & Units 09/23/2024    6:04 AM 09/22/2024    2:09 PM 09/21/2024    4:53 AM  CBC  WBC 4.0 - 10.5 K/uL 5.5  4.7  14.2   Hemoglobin 12.0 - 15.0 g/dL 8.9  7.2  7.9   Hematocrit 36.0 - 46.0 % 28.4  23.6  26.0   Platelets 150 - 400 K/uL 176  145  178       Latest Ref Rng & Units 09/23/2024    6:04 AM 09/21/2024    4:53 AM 09/20/2024   12:49 PM  CMP  Glucose 70 - 99 mg/dL 80  894  835   BUN 8 - 23 mg/dL 26  34  29   Creatinine 0.44 - 1.00 mg/dL 8.86  8.43  8.68   Sodium 135 - 145 mmol/L 141  142  138   Potassium 3.5 - 5.1 mmol/L 3.6  4.3  4.3   Chloride 98 - 111 mmol/L 105  106  104   CO2 22 - 32 mmol/L 24  24  22    Calcium  8.9 - 10.3 mg/dL 8.3  8.5  8.9   Total Protein 6.5 - 8.1 g/dL   6.8   Total Bilirubin 0.0 - 1.2 mg/dL   0.6   Alkaline Phos 38 - 126 U/L   107   AST 15 - 41 U/L   23   ALT 0 - 44 U/L   24      Imaging studies:   CT Abdomen/Pelvis (09/20/2024) personally reviewed with noted mass adjacent to transverse colon, no free air, no abscess, and radiologist report reviewed below:  IMPRESSION: 2.6 x 2.2 cm rounded soft tissue peritoneal mass anteriorly adjacent to the mid transverse colon. Underlying colon wall appears abnormal. Appearance is concerning for possible colon cancer with adjacent peritoneal metastasis or adenopathy. Recommend GI consultation for colonoscopy and/or soft tissue mass biopsy/diagnosis.   Sigmoid diverticulosis.  No active diverticulitis.   Aortic atherosclerosis.   Assessment/Plan:  78 y.o. female with transverse colon mass, non-obstructing, complicated by history of recent PCI on Plavix /ASA, Dementia, decline in functional status   - I was able to review the colonoscopy findings with the patient, and her family at bedside, this afternoon. Noted concern for transverse colon mass without hemorrhage nor obstruction. I had a length y discussion with them regarding our options,  specifically regarding surgical management. My concerns are with her cardiac history, recent PCI on Plavix /ASA, and her noted decline in functional status, she is certainly going to be a higher risk operative candidate. Fortunately, there is no evidence of obstruction or hemorrhage to warrant more urgent interventions and her abdominal examination is grossly benign. I do think it would be reasonable to follow up closely outpatient and determine whether or not patient is acceptable for surgery and wishes to proceed. Patient's family voiced agreement with this plan and they are not sure they wish to subject her to surgery or not at this point. I will be happy to arrange follow up within the next 1-2 weeks with Dr Lane   -  She will benefit from oncology consult; Staging work up to be completed either inpatient or outpatient.   - Okay to resume diet as tolerated from surgical perspective   - Monitor abdominal examination  - Further management per primary service    All of the above findings and recommendations were discussed with the patient and her family, and all of their questions were answered to their expressed satisfaction.  Thank you for the opportunity to participate in this patient's care.   Face-to-face time spent with the patient and care providers was 60 minutes, with more than 50% of the time spent counseling, educating, and coordinating care of the patient.     -- Arthea Platt, PA-C [facility] 09/23/2024, 10:23 AM M-F: 7am - 4pm

## 2024-09-23 NOTE — Anesthesia Postprocedure Evaluation (Signed)
 Anesthesia Post Note  Patient: Hannah Pitts  Procedure(s) Performed: COLONOSCOPY  Patient location during evaluation: PACU Anesthesia Type: General Level of consciousness: awake Pain management: satisfactory to patient Vital Signs Assessment: post-procedure vital signs reviewed and stable Cardiovascular status: blood pressure returned to baseline Anesthetic complications: no   No notable events documented.   Last Vitals:  Vitals:   09/23/24 1013 09/23/24 1023  BP: (!) 140/55 (!) 152/56  Pulse:  62  Resp: 20 14  Temp:    SpO2: 95% 98%    Last Pain:  Vitals:   09/23/24 1023  TempSrc:   PainSc: Asleep                 VAN STAVEREN,Lorice Lafave

## 2024-09-25 ENCOUNTER — Ambulatory Visit: Payer: Self-pay | Admitting: Family Medicine

## 2024-09-25 LAB — CULTURE, BLOOD (ROUTINE X 2)
Culture: NO GROWTH
Culture: NO GROWTH
Special Requests: ADEQUATE
Special Requests: ADEQUATE

## 2024-09-26 ENCOUNTER — Telehealth: Payer: Self-pay

## 2024-09-26 LAB — SURGICAL PATHOLOGY

## 2024-09-26 NOTE — Telephone Encounter (Signed)
 This encounter was created in error - please disregard.

## 2024-09-26 NOTE — Patient Instructions (Signed)
 Visit Information  Thank you for taking time to visit with me today. Please don't hesitate to contact me if I can be of assistance to you.  Patient instructions: Call and schedule hospital follow up visit with primary care provider Notify provider for any new/ ongoing symptoms Take medications as prescribed Seek emergency medical services for severe symtoms: shortness of breath, chest pain   Patient verbalizes understanding of instructions and care plan provided today and agrees to view in MyChart. Active MyChart status and patient understanding of how to access instructions and care plan via MyChart confirmed with patient.     The patient has been provided with contact information for the care management team and has been advised to call with any health related questions or concerns.   Please call the care guide team at [phone] if you need to cancel or reschedule your appointment.   Please call the Suicide and Crisis Lifeline: 988 call the [place]  National Suicide Prevention Lifeline: 803-041-6754 or TTY: 585-065-5896 TTY 248-235-8167) to talk to a trained counselor call 1-800-273-TALK (toll free, 24 hour hotline) if you are experiencing a Mental Health or Behavioral Health Crisis or need someone to talk to.  Arvin Seip RN, BSN, CCM [employer], Population Health Case Manager Phone: [phone]

## 2024-09-26 NOTE — Transitions of Care (Post Inpatient/ED Visit) (Signed)
 09/26/2024  Name: Hannah Pitts MRN: [id number] DOB: 07-15-1946  Today's TOC FU Call Status: Today's TOC FU Call Status:: Successful TOC FU Call Completed TOC FU Call Complete Date: 09/26/24 Patient's Name and Date of Birth confirmed.  Transition Care Management Follow-up Telephone Call Date of Discharge: 09/23/24 Discharge Facility: [facility]) Type of Discharge: Inpatient Admission Primary Inpatient Discharge Diagnosis:: UTI sepsis How have you been since you were released from the hospital?: Better Any questions or concerns?: No  Items Reviewed: Did you receive and understand the discharge instructions provided?: Yes Medications obtained,verified, and reconciled?: Yes (Medications Reviewed) Any new allergies since your discharge?: No Dietary orders reviewed?: Yes Type of Diet Ordered:: low salt heart healthy Do you have support at home?: Yes People in Home [RPT]: child(ren), adult Name of Support/Comfort Primary Source: Hannah Pitts  Medications Reviewed Today: Medications Reviewed Today     Reviewed by Hannah Frasier E, RN (Registered Nurse) on 09/26/24 at 1116  Med List Status: <None>   Medication Order Taking? Sig Documenting Provider Last Dose Status Informant  acetaminophen  (TYLENOL ) 500 MG tablet [id number] Yes Take 500 mg by mouth every 6 (six) hours as needed. [provider]  Active Self, Pharmacy Records, Child  aspirin  EC 81 MG tablet [id number] Yes Take 1 tablet (81 mg total) by mouth daily. Wouk, Hannah Sayres, MD  Active   atorvastatin  (LIPITOR) 40 MG tablet [id number] Yes Take 1 tablet (40 mg total) by mouth daily. Hannah Arlyss RAMAN, MD  Active Child  diphenhydrAMINE  HCl 12.5 MG TBDP [id number] Yes Take 12.5-25 mg by mouth every 6 (six) hours as needed for itching. [provider]  Active Self, Pharmacy Records, Child  escitalopram  (LEXAPRO ) 20 MG tablet [id number] Yes Take 1 tablet (20 mg total) by mouth daily. Hannah Arlyss RAMAN, MD  Active Self, Pharmacy Records, Child  levothyroxine  (SYNTHROID ) 112 MCG tablet [id number] Yes TAKE 1 TABLET (112 MCG TOTAL) BY MOUTH DAILY BEFORE BREAKFAST. EXCEPT SKIP SUNDAY. 6 TABS A WEEK. Hannah Arlyss RAMAN, MD  Active   nitroGLYCERIN  (NITROSTAT ) 0.4 MG SL tablet [id number] Yes Place 1 tablet (0.4 mg total) under the tongue every 5 (five) minutes x 3 doses as needed for chest pain. Hannah Rollo SAUNDERS, PA-C  Active Child  nystatin  (MYCOSTATIN /NYSTOP ) powder [id number]  Apply 1 Application topically 2 (two) times daily as needed.  Patient not taking: Reported on 09/26/2024   Hannah Arlyss RAMAN, MD  Active Child  pramipexole  (MIRAPEX ) 0.125 MG tablet [id number] Yes TAKE 1 TABLET BY MOUTH AT BEDTIME. TO REPLACE REQUIP  Hannah Arlyss RAMAN, MD  Active Self, Pharmacy Records, Child            Home Care and Equipment/Supplies: Were Home Health Services Ordered?: No Any new equipment or medical supplies ordered?: No  Functional Questionnaire: Do you need assistance with bathing/showering or dressing?: Yes Do you need assistance with meal preparation?: Yes Do you need assistance with eating?: No Do you have difficulty maintaining continence: Yes Do you need assistance with getting out of bed/getting out of a chair/moving?: Yes Do you have difficulty managing or taking your medications?: Yes  Follow up appointments reviewed: PCP Follow-up appointment confirmed?: No (daughter states she will call and schedule appointment with primary care provider for patient.) Specialist Hospital Follow-up appointment confirmed?: Yes Date of Specialist follow-up appointment?: 10/04/24 (general surgeon appointment.) Follow-Up Specialty Provider:: Dr. Bobette.  daughter states patient has appointment with oncologist on 09/29/24 with Dr. Jacobo Do you need transportation to your follow-up  appointment?: No Do you understand care options if your condition(s) worsen?: Yes-patient verbalized  understanding  SDOH Interventions Today    Flowsheet Row Most Recent Value  SDOH Interventions   Food Insecurity Interventions Intervention Not Indicated  Housing Interventions Intervention Not Indicated  Transportation Interventions Intervention Not Indicated  Utilities Interventions Intervention Not Indicated   Discussed and offered 30 day TOC program.  Patient's daughter declined.  The patient has been provided with contact information for the care management team and has been advised to call with any health -related questions or concerns.  The patient verbalized understanding with current plan of care.  The patient is directed to their insurance card regarding availability of benefits coverage.    Arvin Seip RN, BSN, CCM [employer], Population Health Case Manager Phone: [phone]

## 2024-09-29 ENCOUNTER — Inpatient Hospital Stay

## 2024-09-29 ENCOUNTER — Encounter: Payer: Self-pay | Admitting: Oncology

## 2024-09-29 ENCOUNTER — Telehealth: Payer: Self-pay | Admitting: Oncology

## 2024-09-29 ENCOUNTER — Inpatient Hospital Stay: Attending: Oncology | Admitting: Oncology

## 2024-09-29 VITALS — BP 118/69 | HR 86 | Temp 98.9°F | Resp 18 | Ht 65.0 in | Wt 208.0 lb

## 2024-09-29 DIAGNOSIS — C184 Malignant neoplasm of transverse colon: Secondary | ICD-10-CM | POA: Insufficient documentation

## 2024-09-29 DIAGNOSIS — D509 Iron deficiency anemia, unspecified: Secondary | ICD-10-CM | POA: Insufficient documentation

## 2024-09-29 DIAGNOSIS — R531 Weakness: Secondary | ICD-10-CM | POA: Insufficient documentation

## 2024-09-29 DIAGNOSIS — Z86711 Personal history of pulmonary embolism: Secondary | ICD-10-CM | POA: Diagnosis not present

## 2024-09-29 DIAGNOSIS — Z86718 Personal history of other venous thrombosis and embolism: Secondary | ICD-10-CM | POA: Diagnosis not present

## 2024-09-29 NOTE — Progress Notes (Signed)
 Patient is here with her daughter, and her daughter told me that her mother is very hard of hearing, so she told me that it was ok to just talk through her daughter. Daughter is wanting Dr. Jacobo to tell the patient that she actually has cancer, she thinks that her mother is in denial.

## 2024-09-29 NOTE — Telephone Encounter (Signed)
 Pt and pt daughter came to my window after her appt with Dr. Georgina to schedule one iron infusion. The daughter states she has a lot of upcoming appts and will call me to schedule the other iron infusions.

## 2024-09-29 NOTE — Progress Notes (Signed)
 [facility]  Telephone:([phone]) [phone] Fax:(336) [phone]  ID: Hannah Pitts OB: May 15, 1946  MR#: [id number]  RDW#:[id number]  Patient Care Team: Cleatus Arlyss RAMAN, MD as PCP - General (Family Medicine)  CHIEF COMPLAINT: Adenocarcinoma of the transverse colon.  INTERVAL HISTORY: Patient is a 78 year old female who recently presented to the emergency room with nausea and vomiting.  She was subsequently admitted with concern for sepsis secondary to UTI.  Further workup with CT scan revealed it mass associated with the transverse colon concerning for malignancy.  Subsequent colonoscopy that confirmed adenocarcinoma.  She has increased weakness and fatigue, but otherwise feels well.  She denies any pain.  She has no neurologic complaints.  She denies any recent fevers.  She has a fair appetite, but denies weight loss.  She has no chest pain, shortness of breath, cough, or hemoptysis.  She denies any nausea, vomiting, constipation, or diarrhea.  She has no further urinary complaints.  Patient offers no further specific complaints today.  REVIEW OF SYSTEMS:   Review of Systems  Constitutional:  Positive for malaise/fatigue. Negative for fever and weight loss.  Respiratory: Negative.  Negative for cough, hemoptysis and shortness of breath.   Cardiovascular: Negative.  Negative for chest pain and leg swelling.  Gastrointestinal: Negative.  Negative for abdominal pain, blood in stool, constipation, diarrhea, melena, nausea and vomiting.  Genitourinary: Negative.  Negative for dysuria.  Musculoskeletal: Negative.  Negative for back pain.  Skin: Negative.  Negative for rash.  Neurological:  Positive for weakness. Negative for dizziness, focal weakness and headaches.  Psychiatric/Behavioral: Negative.  The patient is not nervous/anxious.     As per HPI. Otherwise, a complete review of systems is negative.  PAST MEDICAL HISTORY: Past Medical History:  Diagnosis Date   Abnormal  involuntary movements(781.0)    Anemia of other chronic disease    Depressive disorder, not elsewhere classified    Diaphragmatic hernia without mention of obstruction or gangrene    Dysthymic disorder    Generalized hyperhidrosis    GERD (gastroesophageal reflux disease)    H/O hiatal hernia    History of pulmonary embolus (PE)    Lymphoma (HCC) 03/2010   they got it all w/surgery   Meniere's disease of left ear    Migraine    used to have classic kind; not anymore; last one was in 2012   Other rheumatoid arthritis with visceral or systemic involvement    Palpitations    Personal history of other disorders of nervous system and sense organs    Systemic lupus erythematosus (HCC)    Unspecified disease of pericardium    Pericarditis    Unspecified hypothyroidism    Urticaria, unspecified     PAST SURGICAL HISTORY: Past Surgical History:  Procedure Laterality Date   ABDOMINAL HYSTERECTOMY  1978   APPENDECTOMY  1974   BREAST BIOPSY  03/2010   left breast;    BREAST LUMPECTOMY  03/2010   left breast; nonHodgkins lymphoma; not breast cancer   CHOLECYSTECTOMY  1974   COLONOSCOPY N/A 09/23/2024   Procedure: COLONOSCOPY;  Surgeon: Jinny Carmine, MD;  Location: [facility] ENDOSCOPY;  Service: Endoscopy;  Laterality: N/A;   CORONARY STENT INTERVENTION N/A 06/03/2024   Procedure: CORONARY STENT INTERVENTION;  Surgeon: Swaziland, Peter M, MD;  Location: [facility] INVASIVE CV LAB;  Service: Cardiovascular;  Laterality: N/A;   ESOPHAGEAL DILATION  ?02/2011   ESOPHAGOGASTRODUODENOSCOPY  01-24-11   Slight stenosis esoph spasm   IR IVC FILTER PLMT / S&I PORTER GUID/MOD SED  12/17/2017   LEFT HEART CATH AND CORONARY ANGIOGRAPHY N/A 06/03/2024   Procedure: LEFT HEART CATH AND CORONARY ANGIOGRAPHY;  Surgeon: Swaziland, Peter M, MD;  Location: [facility] INVASIVE CV LAB;  Service: Cardiovascular;  Laterality: N/A;   Left VATS  12/1997   LUMBAR DISC SURGERY  1977   shunt     put in behind left ear ; Menier's syndrome     FAMILY HISTORY: Family History  Problem Relation Age of Onset   Heart failure Mother    Stroke Father    Coronary artery disease Sister    COPD Sister    Coronary artery disease Brother    Diabetes Brother    Heart failure Brother    Coronary artery disease Sister    Coronary artery disease Sister    Cancer Sister    Heart failure Sister    Cancer Sister        lung, esoph   Cancer Sister        lung   Breast cancer Maternal Aunt    Colon cancer Neg Hx     ADVANCED DIRECTIVES (Y/N):  N  HEALTH MAINTENANCE: Social History   Tobacco Use   Smoking status: Never   Smokeless tobacco: Never  Vaping Use   Vaping status: Never Used  Substance Use Topics   Alcohol  use: No   Drug use: No     Colonoscopy:  PAP:  Bone density:  Lipid panel:  Allergies  Allergen Reactions   Heparin  Other (See Comments)    Bruising/bleeding    Current Outpatient Medications  Medication Sig Dispense Refill   acetaminophen  (TYLENOL ) 500 MG tablet Take 500 mg by mouth every 6 (six) hours as needed.     aspirin  EC 81 MG tablet Take 1 tablet (81 mg total) by mouth daily.     atorvastatin  (LIPITOR) 40 MG tablet Take 1 tablet (40 mg total) by mouth daily. 90 tablet 3   diphenhydrAMINE  HCl 12.5 MG TBDP Take 12.5-25 mg by mouth every 6 (six) hours as needed for itching.     escitalopram  (LEXAPRO ) 20 MG tablet Take 1 tablet (20 mg total) by mouth daily. 90 tablet 3   levothyroxine  (SYNTHROID ) 112 MCG tablet TAKE 1 TABLET (112 MCG TOTAL) BY MOUTH DAILY BEFORE BREAKFAST. EXCEPT SKIP SUNDAY. 6 TABS A WEEK. 90 tablet 1   nitroGLYCERIN  (NITROSTAT ) 0.4 MG SL tablet Place 1 tablet (0.4 mg total) under the tongue every 5 (five) minutes x 3 doses as needed for chest pain. 25 tablet 1   nystatin  (MYCOSTATIN /NYSTOP ) powder Apply 1 Application topically 2 (two) times daily as needed. 30 g 3   pramipexole  (MIRAPEX ) 0.125 MG tablet TAKE 1 TABLET BY MOUTH AT BEDTIME. TO REPLACE REQUIP  90 tablet 2   No  current facility-administered medications for this visit.    OBJECTIVE: Vitals:   09/29/24 1346  BP: 118/69  Pulse: 86  Resp: 18  Temp: 98.9 F (37.2 C)  SpO2: 97%     Body mass index is 34.61 kg/m.    ECOG FS:1 - Symptomatic but completely ambulatory  General: Well-developed, well-nourished, no acute distress.  Sitting in a wheelchair. Eyes: Pink conjunctiva, anicteric sclera. HEENT: Normocephalic, moist mucous membranes. Lungs: No audible wheezing or coughing. Heart: Regular rate and rhythm. Abdomen: Soft, nontender, no obvious distention. Musculoskeletal: No edema, cyanosis, or clubbing. Neuro: Alert, answering all questions appropriately. Cranial nerves grossly intact. Skin: No rashes or petechiae noted. Psych: Normal affect. Lymphatics: No cervical, calvicular, axillary or inguinal LAD.  LAB RESULTS:  Lab Results  Component Value Date   NA 141 09/23/2024   K 3.6 09/23/2024   CL 105 09/23/2024   CO2 24 09/23/2024   GLUCOSE 80 09/23/2024   BUN 26 (H) 09/23/2024   CREATININE 1.13 (H) 09/23/2024   CALCIUM  8.3 (L) 09/23/2024   PROT 6.8 09/20/2024   ALBUMIN 3.7 09/20/2024   AST 23 09/20/2024   ALT 24 09/20/2024   ALKPHOS 107 09/20/2024   BILITOT 0.6 09/20/2024   GFRNONAA 49 (L) 09/23/2024   GFRAA >60 12/24/2017    Lab Results  Component Value Date   WBC 5.5 09/23/2024   NEUTROABS 3,234 06/09/2024   HGB 8.9 (L) 09/23/2024   HCT 28.4 (L) 09/23/2024   MCV 82.6 09/23/2024   PLT 176 09/23/2024     STUDIES: CT ABDOMEN PELVIS W CONTRAST Result Date: 09/20/2024 CLINICAL DATA:  Abdominal pain, nausea, vomiting EXAM: CT ABDOMEN AND PELVIS WITH CONTRAST TECHNIQUE: Multidetector CT imaging of the abdomen and pelvis was performed using the standard protocol following bolus administration of intravenous contrast. RADIATION DOSE REDUCTION: This exam was performed according to the departmental dose-optimization program which includes automated exposure control,  adjustment of the mA and/or kV according to patient size and/or use of iterative reconstruction technique. CONTRAST:  80mL OMNIPAQUE  IOHEXOL  300 MG/ML  SOLN COMPARISON:  12/16/2017 FINDINGS: Lower chest: No acute findings. Coronary artery and aortic calcifications. Hepatobiliary: No focal liver abnormality is seen. Status post cholecystectomy. No biliary dilatation. Pancreas: No focal abnormality or ductal dilatation. Spleen: No focal abnormality.  Normal size. Adrenals/Urinary Tract: Adrenal glands normal. Small scattered bilateral subcentimeter renal cysts. No follow-up imaging recommended. 2 mm nonobstructing stones in the lower poles of both kidneys. No ureteral stones or hydronephrosis. Stomach/Bowel: Stomach and small bowel decompressed, unremarkable. 2.6 x 2.2 cm soft tissue nodule anteriorly adjacent to the mid transverse colon. The underlying: Wall may be mildly thickened. Cannot exclude transverse colon malignancy and adjacent spread or adenopathy. No bowel obstruction. Sigmoid diverticulosis. Vascular/Lymphatic: Aortic atherosclerosis. Other than the anterior soft tissue nodule adjacent to the transverse colon, no adenopathy seen. Reproductive: Prior hysterectomy.  No adnexal masses. Other: No free fluid or free air. Musculoskeletal: No acute bony abnormality. IMPRESSION: 2.6 x 2.2 cm rounded soft tissue peritoneal mass anteriorly adjacent to the mid transverse colon. Underlying colon wall appears abnormal. Appearance is concerning for possible colon cancer with adjacent peritoneal metastasis or adenopathy. Recommend GI consultation for colonoscopy and/or soft tissue mass biopsy/diagnosis. Sigmoid diverticulosis.  No active diverticulitis. Aortic atherosclerosis. Electronically Signed   By: Franky Crease M.D.   On: 09/20/2024 18:05   DG Chest Portable 1 View Result Date: 09/20/2024 CLINICAL DATA:  Shortness of breath.  Nausea and vomiting. EXAM: PORTABLE CHEST 1 VIEW COMPARISON:  06/03/2024 FINDINGS:  Shallow inspiration. Mild cardiac enlargement. No vascular congestion, edema, or consolidation. Blunting of the costophrenic angles suggesting small pleural effusions. Probable mild atelectasis in the lung bases. No pneumothorax. Old rib fractures. Calcification of the aorta. IMPRESSION: 1. Cardiac enlargement. 2. Small bilateral pleural effusions with likely basilar atelectasis. No focal consolidation. Electronically Signed   By: Elsie Gravely M.D.   On: 09/20/2024 17:07    ASSESSMENT: Adenocarcinoma of the transverse colon.  PLAN:    Adenocarcinoma of the transverse colon: Diagnosed on biopsy from colonoscopy on September 23, 2024.  By report, patient did not want to have surgery as an inpatient and elected to follow-up as an outpatient.  She has an appointment with surgery on October 04, 2024.  CT scan results from September 20, 2024 revealed a possible peritoneal implant, but no other evidence of disease.  Patient will require a preop CEA at some point in the near future.  No further intervention is needed.  Patient and her daughter have already indicated that she likely will not agree to adjuvant chemotherapy.  Will arrange follow-up postoperatively to discuss her final pathology results and additional treatment planning if necessary. Iron deficiency anemia: Likely secondary to GI blood loss.  Return to clinic 5 times over the next 1 to 2 weeks to receive 200 mg IV Venofer. Renal insufficiency: Mild, monitor.  Patient's most recent creatinine is 1.13. History of PE/DVT: Patient had an IVC filter placed over a decade ago.  Eliquis  has been discontinued given her recent GI bleed. Cardiac disease: Patient had NSTEMI in June 2025.  Per discharge summary, Plavix  was discontinued but patient restarted aspirin .  I spent a total of 60 minutes reviewing chart data, face-to-face evaluation with the patient, counseling and coordination of care as detailed above.   Patient expressed understanding and was  in agreement with this plan. She also understands that She can call clinic at any time with any questions, concerns, or complaints.    Cancer Staging  No matching staging information was found for the patient.   Evalene JINNY Reusing, MD   09/29/2024 2:30 PM

## 2024-09-30 ENCOUNTER — Encounter: Payer: Self-pay | Admitting: Oncology

## 2024-10-01 ENCOUNTER — Other Ambulatory Visit: Payer: Self-pay | Admitting: Family Medicine

## 2024-10-04 ENCOUNTER — Other Ambulatory Visit: Payer: Self-pay

## 2024-10-04 ENCOUNTER — Ambulatory Visit: Admitting: Surgery

## 2024-10-04 ENCOUNTER — Telehealth: Payer: Self-pay

## 2024-10-04 DIAGNOSIS — C184 Malignant neoplasm of transverse colon: Secondary | ICD-10-CM

## 2024-10-04 NOTE — Telephone Encounter (Signed)
 Referral placed to [facility].

## 2024-10-04 NOTE — Telephone Encounter (Signed)
 Received call from Daughter, [name]. Ms. Rusconi has decided not to pursue surgery for her colon cancer or have any iron infusions. She continues to be weak and she in unable to bring her anywhere. She has cancelled surgeon appointment and iron infusions. We discussed palliative care in the home for her cancer, and other medical issues of concern, may be the next step. They live in [facility].

## 2024-10-06 ENCOUNTER — Inpatient Hospital Stay

## 2024-10-12 ENCOUNTER — Inpatient Hospital Stay

## 2024-10-12 ENCOUNTER — Encounter

## 2024-10-13 ENCOUNTER — Inpatient Hospital Stay: Admitting: Family Medicine

## 2024-10-17 ENCOUNTER — Ambulatory Visit: Payer: Self-pay

## 2024-10-17 DIAGNOSIS — K6389 Other specified diseases of intestine: Secondary | ICD-10-CM

## 2024-10-17 DIAGNOSIS — C189 Malignant neoplasm of colon, unspecified: Secondary | ICD-10-CM

## 2024-10-17 NOTE — Telephone Encounter (Signed)
 FYI Only or Action Required?: FYI only for provider.  Patient was last seen in primary care on 08/08/2024 by Hannah Arlyss RAMAN, MD.  Called Nurse Triage reporting Fatigue and Shortness of Breath.  Symptoms began several months ago.  Interventions attempted: OTC medications: Tylenol , Benadryl , Cortisone cream.  Symptoms are: depression, SOB with minimal exertion, cough after eating, bilateral leg pain, itchiness to arms/legs/back, fatigue gradually worsening.  Triage Disposition: See Physician Within 24 Hours (overriding See HCP Within 4 Hours (Or PCP Triage))  Patient/caregiver understands and will follow disposition?: Yes          Copied from CRM #[id number]. Topic: Clinical - Red Word Triage >> Oct 17, 2024  2:45 PM Hannah Pitts wrote: Red Word that prompted transfer to Nurse Triage: difficulty walking and breathing. Previously request OV for tomorrow for hospital follow up be made a virtual appt; approved by CAL.   Hannah Pitts-Patient Daughter [phone] Reason for Disposition  [1] Longstanding difficulty breathing (e.g., CHF, COPD, emphysema) AND [2] WORSE than normal  Answer Assessment - Initial Assessment Questions Patient's daughter, Hannah, on the phone for triage. She has scheduled patient for a hospital follow up tomorrow. Sent to nurse triage due to daughter told PAS the patient can no longer come in the office due to worsening SOB and weakness. The daughter describes it as chronic/gradual worsening over the past 6 months. Confirmed no severe SOB, respiratory distress or cyanosis at this time. No chest pain at this time. Advised daughter of strict ED precautions and symptoms to call 911 for. Daughter verbalizes understanding.    1. RESPIRATORY STATUS: Describe your breathing? (e.g., wheezing, shortness of breath, unable to speak, severe coughing)      Weakness and SOB with minimal exertion (walking 30 feet).  2. ONSET: When did this breathing problem begin?       Chronic. Going on for months.  3. PATTERN Does the difficult breathing come and go, or has it been constant since it started?      Patient is able to catch her breath at rest or sitting a minute.  4. SEVERITY: How bad is your breathing? (e.g., mild, moderate, severe)      Gradually worsening. Daughter states the patient's abilities have worsened over the past 6 months.  5. RECURRENT SYMPTOM: Have you had difficulty breathing before? If Yes, ask: When was the last time? and What happened that time?      Daughter states patient has chronic issues with breathing. COPD in her chart. Daughter states the patient is not on oxygen , nebulizers or inhalers.  6. CARDIAC HISTORY: Do you have any history of heart disease? (e.g., heart attack, angina, bypass surgery, angioplasty)      HYPERTENSION, BENIGN ESSENTIAL PERIPHERAL VASCULAR DISEASE Pulmonary embolism (HCC) Congestive heart failure (CHF) (HCC) NSTEMI (non-ST elevated myocardial infarction) (HCC) CAD S/P stent x 2 06/03/2024   7. LUNG HISTORY: Do you have any history of lung disease?  (e.g., pulmonary embolus, asthma, emphysema)     COPD.  8. CAUSE: What do you think is causing the breathing problem?      Daughter states it is just chronic issues worsening.  9. OTHER SYMPTOMS: Do you have any other symptoms? (e.g., chest pain, cough, dizziness, fever, runny nose)     Depression, coughing after eating, bilateral leg pain (aching, keeps her up at night),arms/legs/back itching, fatigue. Denies hives, worsening cough, fever. Daughter states last chest pain episode was about 1.5-2 months ago. States one of the episodes she took nitroglycerin  and  pain resolved.  10. O2 SATURATION MONITOR:  Do you use an oxygen  saturation monitor (pulse oximeter) at home? If Yes, ask: What is your reading (oxygen  level) today? What is your usual oxygen  saturation reading? (e.g., 95%)       95-98% at rest. Daughter states it gets up to  100% on room air at rest. Daughter states she does not check it when she it exerting herself.  11. PREGNANCY: Is there any chance you are pregnant? When was your last menstrual period?       N/A.  Protocols used: Breathing Difficulty-A-AH

## 2024-10-17 NOTE — Telephone Encounter (Signed)
 I think it makes sense to get the hospice referral ordered in the meantime.  We can talk about this at the OV tomorrow.

## 2024-10-17 NOTE — Telephone Encounter (Signed)
 Please see if there is any way to get this patient seen in clinic.  That would likely but much more useful.  Thanks.

## 2024-10-18 ENCOUNTER — Inpatient Hospital Stay: Admitting: Family Medicine

## 2024-10-18 ENCOUNTER — Telehealth (INDEPENDENT_AMBULATORY_CARE_PROVIDER_SITE_OTHER): Admitting: Family Medicine

## 2024-10-18 ENCOUNTER — Encounter: Payer: Self-pay | Admitting: Family Medicine

## 2024-10-18 ENCOUNTER — Telehealth: Payer: Self-pay

## 2024-10-18 VITALS — BP 113/80 | HR 69 | Temp 97.2°F | Ht 65.0 in | Wt 200.0 lb

## 2024-10-18 DIAGNOSIS — C189 Malignant neoplasm of colon, unspecified: Secondary | ICD-10-CM

## 2024-10-18 MED ORDER — PRAMIPEXOLE DIHYDROCHLORIDE 0.125 MG PO TABS
0.2500 mg | ORAL_TABLET | Freq: Every day | ORAL | Status: AC
Start: 2024-10-18 — End: ?

## 2024-10-18 MED ORDER — ALBUTEROL SULFATE HFA 108 (90 BASE) MCG/ACT IN AERS: 1.0000 | INHALATION_SPRAY | Freq: Four times a day (QID) | RESPIRATORY_TRACT | 0 refills | Status: AC | PRN

## 2024-10-18 NOTE — Telephone Encounter (Signed)
-----   Message from Arlyss Solian sent at 10/18/2024  4:52 PM EDT -----  ----- Message ----- From: Solian Arlyss RAMAN, MD Sent: 10/18/2024   1:27 PM EDT To: Arlyss RAMAN Solian, MD  Please check with hospice/referrals about hospice referral to see when they can eval the patient.  Thanks.   Loreli

## 2024-10-18 NOTE — Progress Notes (Unsigned)
 Virtual visit completed through caregility or similar program Patient location: home  Provider location: [place] at [name], office  Participants: Patient and me (unless stated otherwise below)  Limitations and rationale for visit method d/w patient.  Patient agreed to proceed.  Patient identified by 2 identifiers. If vitals are not listed, then patient was unable to self-report due to a lack of equipment at home via telehealth  CC:  HPI:  She declined treatment/surgery for cancer.  She had declined iron infusion prev, d/w pt.  She can decide about that in the future.    D/w pt about hospice vs palliative care.  She has referral for hospice and she was okay with that decision- ie she wants to talk to hospice.    She isn't SOB at time of the call.  She can episodically get SOB.  D/w pt about anemia.  No wheeze.  Minimal BLE edema, with compression stockings.    She has RLS, d/w pt about RLS and anemia, ie rationale for iron infusion.   Pulse ox 97% today. Pulse 69 today.     She wants to avoid hospitalization and I think that it would be   Meds and allergies reviewed.   ROS: Per HPI unless specifically indicated in ROS section   NAD Speech wnl  A/P:  Discussed  mirapex  0.25mg  to see if that helps. Update me as needed.   SABA.    Needs hospice referral update.

## 2024-10-19 DIAGNOSIS — C189 Malignant neoplasm of colon, unspecified: Secondary | ICD-10-CM | POA: Insufficient documentation

## 2024-10-19 NOTE — Assessment & Plan Note (Signed)
 Colon cancer.  Hospice referral placed.  She wants to talk to hospice about options.  I think this is reasonable.  Discussed anemia which could cause shortness of breath and exacerbate her RLS symptoms.  Discussed iron infusion.  She can consider if she wants to go for that.  Discussed taking mirapex  0.25mg  to see if that helps with RLS symptoms.  Could use albuterol  as needed.  Prescription sent.  Update me as needed.   I will await hospice eval report.  I thank all involved.

## 2024-10-20 NOTE — Telephone Encounter (Signed)
 Per the notes in the referral in Epic, referral received and they will be scheduling the patient.  Hannah Pitts   10/18/24   08:02:25 AM  [facility]   The hospice referral for this patient has been received and is  being processed. Thank you

## 2024-10-26 ENCOUNTER — Encounter: Admitting: Internal Medicine

## 2024-11-09 ENCOUNTER — Encounter

## 2025-04-10 ENCOUNTER — Ambulatory Visit
